# Patient Record
Sex: Male | Born: 1945 | Race: White | Hispanic: No | Marital: Married | Smoking: Never smoker
Health system: Southern US, Community
[De-identification: ages and names within clinical notes are randomized; demographics above are authoritative.]

## PROBLEM LIST (undated history)

## (undated) DIAGNOSIS — C61 Malignant neoplasm of prostate: Secondary | ICD-10-CM

## (undated) DIAGNOSIS — I82409 Acute embolism and thrombosis of unspecified deep veins of unspecified lower extremity: Secondary | ICD-10-CM

## (undated) DIAGNOSIS — Z86718 Personal history of other venous thrombosis and embolism: Secondary | ICD-10-CM

## (undated) DIAGNOSIS — E785 Hyperlipidemia, unspecified: Secondary | ICD-10-CM

## (undated) DIAGNOSIS — L570 Actinic keratosis: Secondary | ICD-10-CM

## (undated) DIAGNOSIS — E119 Type 2 diabetes mellitus without complications: Secondary | ICD-10-CM

## (undated) DIAGNOSIS — G4733 Obstructive sleep apnea (adult) (pediatric): Secondary | ICD-10-CM

## (undated) DIAGNOSIS — G459 Transient cerebral ischemic attack, unspecified: Secondary | ICD-10-CM

## (undated) DIAGNOSIS — E291 Testicular hypofunction: Secondary | ICD-10-CM

## (undated) DIAGNOSIS — K219 Gastro-esophageal reflux disease without esophagitis: Secondary | ICD-10-CM

## (undated) DIAGNOSIS — Z973 Presence of spectacles and contact lenses: Secondary | ICD-10-CM

## (undated) DIAGNOSIS — K429 Umbilical hernia without obstruction or gangrene: Secondary | ICD-10-CM

## (undated) DIAGNOSIS — M1990 Unspecified osteoarthritis, unspecified site: Secondary | ICD-10-CM

## (undated) DIAGNOSIS — Z974 Presence of external hearing-aid: Secondary | ICD-10-CM

## (undated) DIAGNOSIS — N529 Male erectile dysfunction, unspecified: Secondary | ICD-10-CM

## (undated) DIAGNOSIS — I10 Essential (primary) hypertension: Secondary | ICD-10-CM

## (undated) DIAGNOSIS — Z7901 Long term (current) use of anticoagulants: Secondary | ICD-10-CM

## (undated) DIAGNOSIS — Z8673 Personal history of transient ischemic attack (TIA), and cerebral infarction without residual deficits: Secondary | ICD-10-CM

## (undated) DIAGNOSIS — G629 Polyneuropathy, unspecified: Secondary | ICD-10-CM

## (undated) DIAGNOSIS — K449 Diaphragmatic hernia without obstruction or gangrene: Secondary | ICD-10-CM

## (undated) HISTORY — DX: Umbilical hernia without obstruction or gangrene: K42.9

## (undated) HISTORY — DX: Transient cerebral ischemic attack, unspecified: G45.9

## (undated) HISTORY — PX: OTHER SURGICAL HISTORY: SHX169

## (undated) HISTORY — DX: Actinic keratosis: L57.0

## (undated) HISTORY — PX: TOTAL KNEE ARTHROPLASTY: SHX125

---

## 1995-11-19 HISTORY — PX: ELBOW BURSA SURGERY: SHX615

## 2005-03-30 ENCOUNTER — Ambulatory Visit: Payer: Self-pay | Admitting: Unknown Physician Specialty

## 2005-08-04 ENCOUNTER — Ambulatory Visit: Payer: Self-pay | Admitting: Internal Medicine

## 2005-09-08 ENCOUNTER — Ambulatory Visit: Payer: Self-pay | Admitting: Unknown Physician Specialty

## 2006-04-11 ENCOUNTER — Ambulatory Visit: Payer: Self-pay | Admitting: Internal Medicine

## 2006-09-01 ENCOUNTER — Ambulatory Visit: Payer: Self-pay | Admitting: Internal Medicine

## 2007-02-17 ENCOUNTER — Ambulatory Visit: Payer: Self-pay | Admitting: Podiatry

## 2007-09-26 ENCOUNTER — Ambulatory Visit: Payer: Self-pay | Admitting: Internal Medicine

## 2007-11-30 ENCOUNTER — Ambulatory Visit: Payer: Self-pay | Admitting: Internal Medicine

## 2008-06-27 ENCOUNTER — Ambulatory Visit: Payer: Self-pay | Admitting: Internal Medicine

## 2008-07-29 ENCOUNTER — Ambulatory Visit: Payer: Self-pay | Admitting: General Practice

## 2008-08-13 ENCOUNTER — Inpatient Hospital Stay: Payer: Self-pay | Admitting: General Practice

## 2008-09-18 ENCOUNTER — Encounter: Payer: Self-pay | Admitting: General Practice

## 2008-10-18 ENCOUNTER — Encounter: Payer: Self-pay | Admitting: General Practice

## 2008-10-31 ENCOUNTER — Ambulatory Visit: Payer: Self-pay | Admitting: General Practice

## 2008-11-14 ENCOUNTER — Inpatient Hospital Stay: Payer: Self-pay | Admitting: General Practice

## 2008-11-18 ENCOUNTER — Encounter: Payer: Self-pay | Admitting: General Practice

## 2008-12-06 ENCOUNTER — Encounter: Payer: Self-pay | Admitting: General Practice

## 2008-12-16 ENCOUNTER — Encounter: Payer: Self-pay | Admitting: General Practice

## 2008-12-18 ENCOUNTER — Ambulatory Visit: Payer: Self-pay | Admitting: Internal Medicine

## 2009-01-16 ENCOUNTER — Encounter: Payer: Self-pay | Admitting: General Practice

## 2009-03-04 ENCOUNTER — Ambulatory Visit: Payer: Self-pay | Admitting: Unknown Physician Specialty

## 2009-06-18 ENCOUNTER — Ambulatory Visit: Payer: Self-pay | Admitting: Internal Medicine

## 2009-07-04 ENCOUNTER — Ambulatory Visit: Payer: Self-pay

## 2009-08-13 ENCOUNTER — Ambulatory Visit: Payer: Self-pay | Admitting: Internal Medicine

## 2009-08-25 ENCOUNTER — Ambulatory Visit: Payer: Self-pay | Admitting: Internal Medicine

## 2009-09-14 ENCOUNTER — Other Ambulatory Visit: Payer: Self-pay | Admitting: Internal Medicine

## 2009-09-15 ENCOUNTER — Ambulatory Visit: Payer: Self-pay | Admitting: Internal Medicine

## 2009-09-17 ENCOUNTER — Ambulatory Visit: Payer: Self-pay | Admitting: Internal Medicine

## 2009-10-07 DIAGNOSIS — D239 Other benign neoplasm of skin, unspecified: Secondary | ICD-10-CM

## 2009-10-07 HISTORY — DX: Other benign neoplasm of skin, unspecified: D23.9

## 2009-10-18 ENCOUNTER — Ambulatory Visit: Payer: Self-pay | Admitting: Internal Medicine

## 2010-01-27 DIAGNOSIS — C4491 Basal cell carcinoma of skin, unspecified: Secondary | ICD-10-CM

## 2010-01-27 HISTORY — DX: Basal cell carcinoma of skin, unspecified: C44.91

## 2010-07-08 ENCOUNTER — Ambulatory Visit: Payer: Self-pay | Admitting: Internal Medicine

## 2010-11-12 ENCOUNTER — Ambulatory Visit: Payer: Self-pay | Admitting: Unknown Physician Specialty

## 2012-04-11 ENCOUNTER — Emergency Department: Payer: Self-pay | Admitting: Emergency Medicine

## 2012-04-11 LAB — COMPREHENSIVE METABOLIC PANEL
Albumin: 3.6 g/dL (ref 3.4–5.0)
Alkaline Phosphatase: 70 U/L (ref 50–136)
BUN: 19 mg/dL — ABNORMAL HIGH (ref 7–18)
Co2: 25 mmol/L (ref 21–32)
Glucose: 233 mg/dL — ABNORMAL HIGH (ref 65–99)
Potassium: 3.1 mmol/L — ABNORMAL LOW (ref 3.5–5.1)
SGOT(AST): 23 U/L (ref 15–37)
SGPT (ALT): 34 U/L
Total Protein: 7.1 g/dL (ref 6.4–8.2)

## 2012-04-11 LAB — CBC
HCT: 46.8 % (ref 40.0–52.0)
HGB: 16.1 g/dL (ref 13.0–18.0)
MCH: 32.1 pg (ref 26.0–34.0)
MCHC: 34.4 g/dL (ref 32.0–36.0)
MCV: 93 fL (ref 80–100)
Platelet: 176 10*3/uL (ref 150–440)
RBC: 5.02 10*6/uL (ref 4.40–5.90)
RDW: 13.6 % (ref 11.5–14.5)
WBC: 5 10*3/uL (ref 3.8–10.6)

## 2012-06-15 ENCOUNTER — Ambulatory Visit: Payer: Self-pay | Admitting: Surgery

## 2012-06-15 LAB — BASIC METABOLIC PANEL
Chloride: 103 mmol/L (ref 98–107)
EGFR (Non-African Amer.): 53 — ABNORMAL LOW
Osmolality: 285 (ref 275–301)
Potassium: 3.5 mmol/L (ref 3.5–5.1)
Sodium: 139 mmol/L (ref 136–145)

## 2012-06-23 ENCOUNTER — Ambulatory Visit: Payer: Self-pay | Admitting: Surgery

## 2012-06-23 HISTORY — PX: UMBILICAL HERNIA REPAIR: SHX196

## 2012-10-05 ENCOUNTER — Ambulatory Visit: Payer: Self-pay | Admitting: Unknown Physician Specialty

## 2014-01-15 DIAGNOSIS — Z79899 Other long term (current) drug therapy: Secondary | ICD-10-CM | POA: Insufficient documentation

## 2014-08-15 ENCOUNTER — Ambulatory Visit: Payer: Self-pay | Admitting: Nurse Practitioner

## 2014-08-27 ENCOUNTER — Ambulatory Visit: Payer: Self-pay | Admitting: Anesthesiology

## 2014-08-27 LAB — BASIC METABOLIC PANEL
Anion Gap: 10 (ref 7–16)
BUN: 29 mg/dL — ABNORMAL HIGH (ref 7–18)
CALCIUM: 9.1 mg/dL (ref 8.5–10.1)
CHLORIDE: 102 mmol/L (ref 98–107)
CREATININE: 1.49 mg/dL — AB (ref 0.60–1.30)
Co2: 30 mmol/L (ref 21–32)
EGFR (Non-African Amer.): 50 — ABNORMAL LOW
GLUCOSE: 154 mg/dL — AB (ref 65–99)
Osmolality: 292 (ref 275–301)
Potassium: 3.6 mmol/L (ref 3.5–5.1)
SODIUM: 142 mmol/L (ref 136–145)

## 2014-09-04 ENCOUNTER — Ambulatory Visit: Payer: Self-pay | Admitting: General Practice

## 2014-09-04 HISTORY — PX: SHOULDER OPEN ROTATOR CUFF REPAIR: SHX2407

## 2015-01-05 DIAGNOSIS — Z9889 Other specified postprocedural states: Secondary | ICD-10-CM | POA: Insufficient documentation

## 2015-02-04 NOTE — Op Note (Signed)
PATIENT NAME:  Jeremiah Zhang, Jeremiah Zhang MR#:  [id number] DATE OF BIRTH:  Nov 02, 1945  DATE OF PROCEDURE:  06/23/2012  PREOPERATIVE DIAGNOSIS: Umbilical hernia.   POSTOPERATIVE DIAGNOSIS: Umbilical hernia.   PROCEDURE: Umbilical hernia repair.   SURGEON: Rochel Brome, M.D.   ANESTHESIA: General.   INDICATIONS: This 69 year old male reports a bulge in the navel. It has been enlarging and causing some mild discomfort. An umbilical hernia was demonstrated on physical exam and repair was recommended for definitive treatment.   DESCRIPTION OF PROCEDURE: The patient was placed on the operating table in the supine position under general anesthesia. The abdomen was clipped and prepared with ChloraPrep and draped in a sterile manner. A transversely oriented curvilinear supraumbilical incision was made, carried down through subcutaneous tissues to encounter an umbilical hernia sac. This sac was dissected free from surrounding structures down through the fascial ring defect and it appeared to contain omentum. The sac was inverted and the properitoneal fat was separated from the fascial ring defect. A portion of attenuated fascia was excised. It did not need to be sent for pathology. Next, an Atrium mesh was selected and cut to create an oval shape of 1.5 x 2.5 cm in dimension. It was placed in the properitoneal plane, oriented transversely and sutured to the overlying fascia with through and through 0 Surgilon sutures. Next, the defect was closed with a transversely oriented suture line of interrupted 0 Surgilon figure-of-eight sutures incorporating mesh into each suture. The repair looked good. It is noted that during the course of the procedure a number of small bleeding points were cauterized. Hemostasis was subsequently intact. The subcutaneous tissues and deep fascia was infiltrated with 0.5% Sensorcaine with epinephrine. The skin of the umbilicus was sutured to the deep fascia with interrupted 5-0 Monocryl. The skin  was closed      with a running 5-0 Monocryl subcuticular suture and Dermabond. The patient tolerated surgery satisfactorily and was prepared for transfer to the recovery room.  ____________________________ Lenna Sciara. Rochel Brome, MD jws:ap D: 06/23/2012 08:40:14 ET T: 06/23/2012 13:14:25 ET JOB#: [id number]  cc: Loreli Dollar, MD, <Dictator> Loreli Dollar MD ELECTRONICALLY SIGNED 06/28/2012 15:51

## 2015-02-08 NOTE — Op Note (Signed)
PATIENT NAME:  Jeremiah Zhang, Jeremiah Zhang MR#:  [id number] DATE OF BIRTH:  August 24, 1946  DATE OF PROCEDURE:  09/04/2014  PREOPERATIVE DIAGNOSIS: Left rotator cuff tear.   POSTOPERATIVE DIAGNOSIS: Left rotator cuff tear.   PROCEDURE PERFORMED: Left subacromial decompression and rotator cuff tear (supraspinatus).   SURGEON: Skip Estimable, MD   ANESTHESIA: Interscalene block and general.   ESTIMATED BLOOD LOSS: 25 mL.   FLUIDS REPLACED: 1300 mL of crystalloid.   DRAINS: None.   IMPLANTS UTILIZED: ArthroCare Spartan 5.5 mm PEEK suture anchors x 2.   INDICATIONS FOR SURGERY: The patient is a 69 year old male who has been seen for complaints of left shoulder pain and difficulty with active range of motion. MRI demonstrated findings consistent with retracted tear of the rotator cuff. After discussion of the risks and benefits of surgical intervention, the patient expressed understanding of the risks and benefits and agreed with plans for surgical intervention.   PROCEDURE IN DETAIL: The patient was brought to the operating room and, after adequate interscalene block and general endotracheal anesthesia were achieved, the patient was placed in the modified beach chair position. The head was secured in headrest and all bony prominences were well padded. The patient's left shoulder and arm were cleaned and prepped with alcohol and DuraPrep and draped in the usual sterile fashion. A "timeout" was performed as per usual protocol. The anticipated incision site was injected with 0.25% Marcaine with epinephrine. An anterior oblique incision was made roughly bisecting the anterior aspect of the acromion. Deltoid was split in line and a "deltoid on" approach was utilized by elevating the deltoid in a subperiosteal fashion off the acromion. The subdeltoid bursa was noted to be extremely thickened and was subsequently excised. A Darrach retractor was inserted so as to protect the rotator cuff and an anterior-inferior wafer of  bone was removed from the acromion measuring approximately 3 mm in thickness. The TPS high-speed rasp was subsequently used to further debride and decompress the subacromial region. The wound was irrigated with copious amounts of normal saline with antibiotic solution. The supraspinatus was completely torn and retracted. A Joker elevator was used to mobilize the leading edge of the supraspinatus tendon. A trough was made in the bone just medial to the greater tuberosity. Two Spartan 5.5 mm PEEK suture anchors were placed and the supraspinatus tendon was repaired in place using the associated FiberWire sutures. Good maintenance of the repair was noted with placement of  shoulder through range of motion. The wound was irrigated with copious amounts of normal saline with antibiotic solution. Deltoid was repaired in a side-to-side fashion using interrupted sutures of #1 Ethibond. The subcutaneous tissue was approximated in layers using first #0 Vicryl followed by 2-0 Vicryl. Skin was closed with a running subcuticular suture of 4-0 Vicryl. Steri-Strips were applied followed by application of a sterile dressing. The patient tolerated the procedure well. He was transported to the recovery room in stable condition.   ____________________________ Laurice Record. Holley Bouche., MD jph:bm D: 09/05/2014 00:30:28 ET T: 09/05/2014 03:18:15 ET JOB#: [id number]  cc: Laurice Record. Holley Bouche., MD, <Dictator> Laurice Record Holley Bouche MD ELECTRONICALLY SIGNED 09/05/2014 21:42

## 2015-05-05 ENCOUNTER — Other Ambulatory Visit: Payer: Self-pay | Admitting: Urology

## 2015-05-15 ENCOUNTER — Other Ambulatory Visit: Payer: Self-pay | Admitting: Urology

## 2015-05-26 ENCOUNTER — Encounter (HOSPITAL_BASED_OUTPATIENT_CLINIC_OR_DEPARTMENT_OTHER): Payer: Self-pay | Admitting: *Deleted

## 2015-05-26 NOTE — Progress Notes (Addendum)
NPO AFTER MN. ARRIVE AT 0715.  NEEDS ISTAT.  CURRENT EKG IN CHART AND EPIC.   WILL TAKE NEXIUM AM DOS W/ SIPS OF WATER.

## 2015-06-01 NOTE — H&P (Signed)
Reason For Visit Consult to discuss focal cryotherapy   Active Problems Problems  1. Decreased libido (R68.82) 2. Elevated prostate specific antigen (PSA) (R97.2) 3. Erectile dysfunction (N52.9) 4. Prostate cancer (C61)  History of Present Illness 8 male referred by Dr. Louis Meckel for consult to discuss focal cryotherapy for hx of prostate cancer. Originally referred by Dr. Dimas Aguas, MD for eval and management of an elevated PSA.    Patient was found to have an elevated PSA which was drawn as part of a prostate cancer screening. He has no family history of prostate cancer. The patient denies any bone pain, new back pain, or lower extremity edema. The patient denies any changes in his voiding symptoms over the last 6 months. Specifically he denies dysuria or hematuria.    The patient was taking Testosterone replacement therapy (3 pumps Androgel daily). This also was in therapy has been stopped at this time.    PSA History:  2.0 on 08/02/11  4.41 on 01/2014   6.4 (13.6% free) on 07/2014   3.86 on 08/21/14  6.98 (36%free) on 11/19/14  4.14 on 11/25/14  3.25 on 01/24/15    Testosterone:  408 on 01/2014  871 on 07/2014  123 on 11/19/14  121 on 02/11/15    IPSS: 9, QoL 5  SHIM: He has very diminished erectile function. This is despite taking Cialis 10 mg daily. Occasionally he is able to obtain an erection which is not durable  On Plavix for concern of intermittent TIAs    Prostate cancer profile  Stage: T1c  PSA: 4.14  Biopsy , 1/12 cores positive: 4+3= 7(<5%) LMB, ASAP RMA, HGPIN RLM  Prostate volume: 76gm    Prostate cancer nomogram after radical prostatectomy:  OC- 57%  ECE- 46%  SVI- 4%  LNI -5%  PFS (surgery)- 79%(50yrs)/67% (40yrs)    Interval: The patient presents today to discuss his pathology report from his recent prostate biopsy. The patient states that he had some blood per rectum following his biopsy, this resolved within a few  days. He did not have any hematuria. He denies any fevers or chills, no evidence of infections.   Past Medical History Problems  1. 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 History of arthritis (Z87.39) 3. History of diabetes mellitus (Z86.39) 4. History of heartburn (Z87.898) 5. History of hypercholesterolemia (Z86.39) 6. History of hypertension (Z86.79) 7. History of sleep apnea (Z87.09)  Surgical History Problems  1. History of Arthroscopy Elbow Left 2. History of Knee Surgery Left 3. History of Knee Surgery Right 4. History of Rotator Cuff Repair  Current Meds 1. Bisoprolol-Hydrochlorothiazide 10-6.25 MG Oral Tablet;  Therapy: (Recorded:04Nov2015) to Recorded 2. Cialis 10 MG Oral Tablet; TAKE 1 TABLET DAILY 1 HOUR BEFORE NEEDED;  Therapy: [id number] to (Evaluate:21Feb2016); Last Rx:17Nov2015 Ordered 3. Cialis 5 MG Oral Tablet; Take 1 tablet daily;  Therapy: [id number] to (Evaluate:18May2016)  Requested for: [phone]; Last  Rx:20Nov2015 Ordered 4. Daily Multivitamin TABS;  Therapy: (Recorded:04Nov2015) to Recorded 5. EpiPen 2-Pak 0.3 MG/0.3ML DEVI;  Therapy: (Recorded:04Nov2015) to Recorded 6. Furosemide 40 MG Oral Tablet;  Therapy: (Recorded:04Nov2015) to Recorded 7. Glimepiride 2 MG Oral Tablet; 2/daily;  Therapy: (Recorded:04Nov2015) to Recorded 8. Invokana 300 MG Oral Tablet;  Therapy: (Recorded:04Nov2015) to Recorded 9. Jublia SOLN;  Therapy: (Recorded:08Feb2016) to Recorded 10. MetFORMIN HCl - 1000 MG Oral Tablet;   Therapy: (Recorded:04Nov2015) to Recorded 11. NexIUM 40 MG Oral Capsule Delayed Release;   Therapy: (Recorded:04Nov2015) to Recorded 12. Plavix 75 MG Oral Tablet;  Therapy: (Recorded:08Feb2016) to Recorded 13. Quinapril HCl TABS; 20mg  x 2 daily;   Therapy: (Recorded:04Nov2015) to Recorded 14. Vitamin B Complex TABS; B1-3mg , B2-3mg ;   Therapy: (Recorded:04Nov2015) to Recorded 15. Vitamin C TABS;   Therapy: (Recorded:04Nov2015)  to Recorded 16. Vitamin D3 CAPS; 2000 iu;   Therapy: (Recorded:04Nov2015) to Recorded  Allergies Medication  1. Crestor TABS 2. Dynapen 3. Vytorin TABS Non-Medication  4. Bee sting  Family History Problems  1. No pertinent family history : Mother, Father  Social History Problems    Denied: History of Alcohol use   Caffeine use (F15.90)   Father deceased   Married   Mother deceased   Never a smoker   Number of children   Occupation   Retired  Review of Systems No changes in pts bowel habits, neurological changes, or progressive lower urinary tract symptoms.      Vitals Vital Signs [Data Includes: Last 1 Day]  Recorded: [id number] 04:46PM  Blood Pressure: 118 / 80 Temperature: 98.3 F Heart Rate: 65  Physical Exam Constitutional: Well nourished and well developed . No acute distress.  ENT:. The ears and nose are normal in appearance.  Neck: The appearance of the neck is normal and no neck mass is present.  Pulmonary: No respiratory distress and normal respiratory rhythm and effort.  Cardiovascular: Heart rate and rhythm are normal . No peripheral edema.  Abdomen: The abdomen is soft and nontender. No masses are palpated. No CVA tenderness. No hernias are palpable. No hepatosplenomegaly noted.  Rectal: Rectal exam demonstrates normal sphincter tone, no tenderness and no masses. The prostate has no nodularity and is not tender. The left seminal vesicle is nonpalpable. The right seminal vesicle is nonpalpable. The perineum is normal on inspection.  Genitourinary: Examination of the penis demonstrates no discharge, no masses, no lesions and a normal meatus. The scrotum is without lesions. The right epididymis is palpably normal and non-tender. The left epididymis is palpably normal and non-tender. The right testis is non-tender and without masses. The left testis is non-tender and without masses.  Lymphatics: The femoral and inguinal nodes are not enlarged or tender.   Skin: Normal skin turgor, no visible rash and no visible skin lesions.  Neuro/Psych:. Mood and affect are appropriate.    Results/Data Selected Results  TESTOSTERONE 25Apr2016 03:21PM Louis Meckel  SPECIMEN TYPE: BLOOD   Test Name Result Flag Reference  TESTOSTERONE, TOTAL 121 ng/dL L 300-890  TANNER STAGE       MALE              MALE               I              < 30 NG/DL        < 10 NG/DL               II             < 150 NG/DL       < 30 NG/DL               III            100-320 NG/DL     < 35 NG/DL               IV             200-970 NG/DL     15-40 NG/DL  V/ADULT        300-890 NG/DL     10-70 NG/DL   PSA [id number] 09:11AM Louis Meckel  SPECIMEN TYPE: BLOOD   Test Name Result Flag Reference  PSA 4.14 ng/mL H <=4.00  TEST METHODOLOGY: ECLIA PSA (ELECTROCHEMILUMINESCENCE IMMUNOASSAY)   Assessment Assessed  1. Prostate cancer (C61) 2. Erectile dysfunction (N52.9) 3. Elevated prostate specific antigen (PSA) (R97.2) 4. Hypogonadism, testicular (E29.1) 5. Decreased libido (R68.82)  ( 60 monute discussion re: focal Rx of CaP) Dr. 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 We have discussed the history of Gleason grading, and the meaning of Gleason scoring and Gleason summation. He understands that Gleason sums of 2, 3, 4, 5, and 6 are often untreated, and would be observed-if even reported. However, his cancer is a G 7 disease, and we have discussed the difference on G 3+4 and G 4+ 3 prostate cancer. He has been offered observation and slso LRRP by Dr Louis Meckel. However, he desires to evaluate alternative treatments for his disease. I have indicated that I agree with Dr. Louis Meckel that, even though he has low volume of cancer, the fact that he has Grade 4 cancer would push me to explore treatment alternatives with him. He is in agreement with this opinion.    We have discussed the history of I-125 seed therapy development, in [place],  in [place], and in [place]; and the option for this treatment for him. In addition, we have discussed the development of focal therapy and also the history of cryotherapy for prostate cancer. We have discussed the biology of cryotherapy, and how it will work in his particular case. We have also considered the "newness" of focal therapy, and options for this Rx, including focal cryo, hemicryo, HoLap enucleation ( Humphries, Mayo, [place]); and potential for future hormone replacement therapy for his clinically significant hypogonadism.    He will need to stop Plavix for surgery. He will need to have f/u biopsies in 1 year, and could be considered for restart of hormone therapy when his PSA nadirs. We have considered potential for incontinence, anesthesia complications, and compared this surgery with other surgeries ( I-125 seed Rx, IMRT, LRRP).    We have answered all questions to the pt's satisfaction and to the satisfaction of his wife ( retired Tree surgeon)   Plan Focal cryotherapy of the prostate,   Discussion/Summary cc: Dr. Burman Nieves  cc: Dr. Maxie Barb     Signatures Electronically signed by : Carolan Clines, M.D.; Apr 30 2015  6:25PM EST

## 2015-06-02 ENCOUNTER — Ambulatory Visit (HOSPITAL_BASED_OUTPATIENT_CLINIC_OR_DEPARTMENT_OTHER)
Admission: RE | Admit: 2015-06-02 | Discharge: 2015-06-02 | Disposition: A | Discharge status: Home / Self Care | Payer: Medicare Other | Source: Ambulatory Visit | Attending: Urology | Admitting: Urology

## 2015-06-02 ENCOUNTER — Encounter (HOSPITAL_BASED_OUTPATIENT_CLINIC_OR_DEPARTMENT_OTHER)
Admission: RE | Disposition: A | Discharge status: Home / Self Care | Payer: Self-pay | Source: Ambulatory Visit | Attending: Urology

## 2015-06-02 ENCOUNTER — Ambulatory Visit (HOSPITAL_BASED_OUTPATIENT_CLINIC_OR_DEPARTMENT_OTHER): Payer: Medicare Other | Admitting: Anesthesiology

## 2015-06-02 ENCOUNTER — Encounter (HOSPITAL_BASED_OUTPATIENT_CLINIC_OR_DEPARTMENT_OTHER): Payer: Self-pay | Admitting: *Deleted

## 2015-06-02 DIAGNOSIS — N529 Male erectile dysfunction, unspecified: Secondary | ICD-10-CM | POA: Insufficient documentation

## 2015-06-02 DIAGNOSIS — K449 Diaphragmatic hernia without obstruction or gangrene: Secondary | ICD-10-CM | POA: Diagnosis not present

## 2015-06-02 DIAGNOSIS — G4730 Sleep apnea, unspecified: Secondary | ICD-10-CM | POA: Insufficient documentation

## 2015-06-02 DIAGNOSIS — C61 Malignant neoplasm of prostate: Secondary | ICD-10-CM | POA: Insufficient documentation

## 2015-06-02 DIAGNOSIS — Z79899 Other long term (current) drug therapy: Secondary | ICD-10-CM | POA: Insufficient documentation

## 2015-06-02 DIAGNOSIS — K219 Gastro-esophageal reflux disease without esophagitis: Secondary | ICD-10-CM | POA: Insufficient documentation

## 2015-06-02 DIAGNOSIS — E291 Testicular hypofunction: Secondary | ICD-10-CM | POA: Diagnosis not present

## 2015-06-02 DIAGNOSIS — E119 Type 2 diabetes mellitus without complications: Secondary | ICD-10-CM | POA: Diagnosis not present

## 2015-06-02 DIAGNOSIS — M1990 Unspecified osteoarthritis, unspecified site: Secondary | ICD-10-CM | POA: Insufficient documentation

## 2015-06-02 DIAGNOSIS — Z7902 Long term (current) use of antithrombotics/antiplatelets: Secondary | ICD-10-CM | POA: Insufficient documentation

## 2015-06-02 DIAGNOSIS — I10 Essential (primary) hypertension: Secondary | ICD-10-CM | POA: Diagnosis not present

## 2015-06-02 HISTORY — DX: Essential (primary) hypertension: I10

## 2015-06-02 HISTORY — DX: Hyperlipidemia, unspecified: E78.5

## 2015-06-02 HISTORY — DX: Malignant neoplasm of prostate: C61

## 2015-06-02 HISTORY — DX: Type 2 diabetes mellitus without complications: E11.9

## 2015-06-02 HISTORY — DX: Presence of external hearing-aid: Z97.4

## 2015-06-02 HISTORY — DX: Obstructive sleep apnea (adult) (pediatric): G47.33

## 2015-06-02 HISTORY — DX: Gastro-esophageal reflux disease without esophagitis: K21.9

## 2015-06-02 HISTORY — DX: Presence of spectacles and contact lenses: Z97.3

## 2015-06-02 HISTORY — DX: Unspecified osteoarthritis, unspecified site: M19.90

## 2015-06-02 HISTORY — PX: CRYOABLATION: SHX1415

## 2015-06-02 LAB — POCT I-STAT, CHEM 8
BUN: 21 mg/dL — ABNORMAL HIGH (ref 6–20)
CALCIUM ION: 1.17 mmol/L (ref 1.13–1.30)
CHLORIDE: 100 mmol/L — AB (ref 101–111)
Creatinine, Ser: 1.2 mg/dL (ref 0.61–1.24)
Glucose, Bld: 143 mg/dL — ABNORMAL HIGH (ref 65–99)
HCT: 44 % (ref 39.0–52.0)
Hemoglobin: 15 g/dL (ref 13.0–17.0)
POTASSIUM: 3.7 mmol/L (ref 3.5–5.1)
Sodium: 141 mmol/L (ref 135–145)
TCO2: 26 mmol/L (ref 0–100)

## 2015-06-02 LAB — GLUCOSE, CAPILLARY: GLUCOSE-CAPILLARY: 146 mg/dL — AB (ref 65–99)

## 2015-06-02 SURGERY — CRYOABLATION, PROSTATE
Anesthesia: General | Site: Prostate

## 2015-06-02 MED ORDER — MIDAZOLAM HCL 5 MG/5ML IJ SOLN
INTRAMUSCULAR | Status: DC | PRN
  Administered 2015-06-02: 2 mg via INTRAVENOUS

## 2015-06-02 MED ORDER — ONDANSETRON HCL 4 MG/2ML IJ SOLN
INTRAMUSCULAR | Status: DC | PRN
  Administered 2015-06-02: 4 mg via INTRAVENOUS

## 2015-06-02 MED ORDER — MIDAZOLAM HCL 5 MG/5ML IJ SOLN: INTRAMUSCULAR | Status: DC | PRN

## 2015-06-02 MED ORDER — PHENAZOPYRIDINE HCL 200 MG PO TABS: 200.0000 mg | ORAL_TABLET | Freq: Three times a day (TID) | ORAL | Status: DC | PRN

## 2015-06-02 MED ORDER — PROPOFOL 10 MG/ML IV BOLUS
INTRAVENOUS | Status: DC | PRN
  Administered 2015-06-02: 200 mg via INTRAVENOUS

## 2015-06-02 MED ORDER — FENTANYL CITRATE (PF) 100 MCG/2ML IJ SOLN
INTRAMUSCULAR | Status: AC
  Filled 2015-06-02: qty 4

## 2015-06-02 MED ORDER — TAMSULOSIN HCL 0.4 MG PO CAPS: 0.4000 mg | ORAL_CAPSULE | Freq: Every day | ORAL | Status: DC

## 2015-06-02 MED ORDER — ACETAMINOPHEN 10 MG/ML IV SOLN
INTRAVENOUS | Status: DC | PRN
  Administered 2015-06-02: 1000 mg via INTRAVENOUS

## 2015-06-02 MED ORDER — GENTAMICIN SULFATE 40 MG/ML IJ SOLN
440.0000 mg | Freq: Once | INTRAMUSCULAR | Status: AC
  Administered 2015-06-02: 440 mg via INTRAVENOUS
  Filled 2015-06-02: qty 11

## 2015-06-02 MED ORDER — MIDAZOLAM HCL 2 MG/2ML IJ SOLN
INTRAMUSCULAR | Status: AC
  Filled 2015-06-02: qty 2

## 2015-06-02 MED ORDER — LACTATED RINGERS IV SOLN
INTRAVENOUS | Status: DC
  Administered 2015-06-02 (×3): via INTRAVENOUS
  Filled 2015-06-02: qty 1000

## 2015-06-02 MED ORDER — OXYCODONE-ACETAMINOPHEN 5-325 MG PO TABS: 1.0000 | ORAL_TABLET | ORAL | Status: DC | PRN

## 2015-06-02 MED ORDER — DEXAMETHASONE SODIUM PHOSPHATE 4 MG/ML IJ SOLN
INTRAMUSCULAR | Status: DC | PRN
  Administered 2015-06-02: 10 mg via INTRAVENOUS

## 2015-06-02 MED ORDER — LIDOCAINE HCL (CARDIAC) 20 MG/ML IV SOLN
INTRAVENOUS | Status: DC | PRN
  Administered 2015-06-02: 80 mg via INTRAVENOUS

## 2015-06-02 MED ORDER — SODIUM CHLORIDE 0.9 % IV SOLN
2.0000 g | Freq: Once | INTRAVENOUS | Status: AC
  Administered 2015-06-02: 2 g via INTRAVENOUS
  Filled 2015-06-02: qty 2000

## 2015-06-02 MED ORDER — FENTANYL CITRATE (PF) 100 MCG/2ML IJ SOLN
INTRAMUSCULAR | Status: DC | PRN
  Administered 2015-06-02: 50 ug via INTRAVENOUS
  Administered 2015-06-02 (×2): 25 ug via INTRAVENOUS

## 2015-06-02 MED ORDER — DOXYCYCLINE HYCLATE 50 MG PO CAPS: 100.0000 mg | ORAL_CAPSULE | Freq: Two times a day (BID) | ORAL | Status: DC

## 2015-06-02 MED ORDER — FENTANYL CITRATE (PF) 100 MCG/2ML IJ SOLN
25.0000 ug | INTRAMUSCULAR | Status: DC | PRN
  Administered 2015-06-02: 25 ug via INTRAVENOUS
  Filled 2015-06-02: qty 1

## 2015-06-02 MED ORDER — BUPIVACAINE HCL 0.25 % IJ SOLN
INTRAMUSCULAR | Status: DC | PRN
  Administered 2015-06-02: 10 mL

## 2015-06-02 MED ORDER — FENTANYL CITRATE (PF) 100 MCG/2ML IJ SOLN
INTRAMUSCULAR | Status: AC
  Filled 2015-06-02: qty 2

## 2015-06-02 MED ORDER — LACTATED RINGERS IV SOLN
INTRAVENOUS | Status: DC
  Filled 2015-06-02: qty 1000

## 2015-06-02 MED ORDER — KETOROLAC TROMETHAMINE 30 MG/ML IJ SOLN
INTRAMUSCULAR | Status: DC | PRN
  Administered 2015-06-02: 30 mg via INTRAVENOUS

## 2015-06-02 MED ORDER — DEXTROSE 5 % IV SOLN: 1.5000 mg/kg | Freq: Once | INTRAVENOUS | Status: DC

## 2015-06-02 MED ORDER — SODIUM CHLORIDE 0.9 % IR SOLN
Status: DC | PRN
  Administered 2015-06-02: 3000 mL

## 2015-06-02 MED ORDER — AMPICILLIN SODIUM 1 G IJ SOLR
INTRAMUSCULAR | Status: AC
  Filled 2015-06-02: qty 2000

## 2015-06-02 MED ORDER — EPHEDRINE SULFATE 50 MG/ML IJ SOLN
INTRAMUSCULAR | Status: DC | PRN
  Administered 2015-06-02: 5 mg via INTRAVENOUS
  Administered 2015-06-02: 10 mg via INTRAVENOUS
  Administered 2015-06-02 (×2): 5 mg via INTRAVENOUS
  Administered 2015-06-02: 10 mg via INTRAVENOUS

## 2015-06-02 SURGICAL SUPPLY — 40 items
BAG DRN ANRFLXCHMBR STRAP LEK (BAG)
BAG URINE DRAINAGE (UROLOGICAL SUPPLIES) ×2 IMPLANT
BAG URINE LEG 19OZ MD ST LTX (BAG) IMPLANT
BLADE CLIPPER SURG (BLADE) ×3 IMPLANT
BNDG CONFORM 3 STRL LF (GAUZE/BANDAGES/DRESSINGS) IMPLANT
BOOTIES KNEE HIGH SLOAN (MISCELLANEOUS) ×3 IMPLANT
CATH COUDE FOLEY 2W 5CC 18FR (CATHETERS) ×2 IMPLANT
CATH FOLEY 2WAY SLVR  5CC 18FR (CATHETERS)
CATH FOLEY 2WAY SLVR 5CC 18FR (CATHETERS) ×1 IMPLANT
CHARGE TECH PROCEDURE ONCURA (LABOR (TRAVEL & OVERTIME)) ×3 IMPLANT
CLOTH BEACON ORANGE TIMEOUT ST (SAFETY) ×3 IMPLANT
COVER BACK TABLE 60X90IN (DRAPES) ×3 IMPLANT
COVER MAYO STAND STRL (DRAPES) ×1 IMPLANT
DRAPE INCISE IOBAN 66X45 STRL (DRAPES) ×3 IMPLANT
DRSG TEGADERM 4X4.75 (GAUZE/BANDAGES/DRESSINGS) ×3 IMPLANT
DRSG TEGADERM 8X12 (GAUZE/BANDAGES/DRESSINGS) ×3 IMPLANT
GAS ARGON HIGH PRESSURE (MEDICAL GASES) ×3 IMPLANT
GAS HELIUM HIGH PRESSURE (MEDICAL GASES) ×3 IMPLANT
GAUZE SPONGE 4X4 12PLY STRL (GAUZE/BANDAGES/DRESSINGS) IMPLANT
GLOVE BIO SURGEON STRL SZ 6.5 (GLOVE) ×1 IMPLANT
GLOVE BIO SURGEON STRL SZ7.5 (GLOVE) ×5 IMPLANT
GLOVE BIO SURGEONS STRL SZ 6.5 (GLOVE) ×1
GLOVE INDICATOR 6.5 STRL GRN (GLOVE) ×2 IMPLANT
GUIDEWIRE SUPER STIFF (WIRE) ×3 IMPLANT
HOLDER FOLEY CATH W/STRAP (MISCELLANEOUS) ×3 IMPLANT
KIT CRYO ENDOCARE (DISPOSABLE) ×2 IMPLANT
KIT ICE ROD PROCEDURE PRECISE (DISPOSABLE) IMPLANT
KIT PROSTATE PRESICE I (KITS) IMPLANT
MANIFOLD NEPTUNE II (INSTRUMENTS) IMPLANT
NDL SPNL 18GX3.5 QUINCKE PK (NEEDLE) IMPLANT
NEEDLE SPNL 18GX3.5 QUINCKE PK (NEEDLE) IMPLANT
PACK CYSTOSCOPY (CUSTOM PROCEDURE TRAY) ×3 IMPLANT
PLUG CATH AND CAP STER (CATHETERS) ×3 IMPLANT
SHEET LAVH (DRAPES) ×1 IMPLANT
SPONGE GAUZE 4X4 12PLY STER LF (GAUZE/BANDAGES/DRESSINGS) ×2 IMPLANT
SUT SILK 3 0 PS 1 (SUTURE) ×2 IMPLANT
SYRINGE 10CC LL (SYRINGE) ×2 IMPLANT
SYRINGE IRR TOOMEY STRL 70CC (SYRINGE) IMPLANT
UNDERPAD 30X30 INCONTINENT (UNDERPADS AND DIAPERS) ×3 IMPLANT
WATER STERILE IRR 500ML POUR (IV SOLUTION) ×3 IMPLANT

## 2015-06-02 NOTE — Interval H&P Note (Signed)
History and Physical Interval Note:  06/02/2015 8:47 AM  Jeanine Luz Maris  has presented today for surgery, with the diagnosis of PROSTATE CANCER   The various methods of treatment have been discussed with the patient and family. After consideration of risks, benefits and other options for treatment, the patient has consented to  Procedure(s): CRYO ABLATION PROSTATE (N/A) as a surgical intervention .  The patient's history has been reviewed, patient examined, no change in status, stable for surgery.  I have reviewed the patient's chart and labs.  Questions were answered to the patient's satisfaction.   Urology Attending Note: Dx and Rsx again discussed with pt. He as bilateral knee orthopedic appliances; therefore his antibiotic changed to gentamycin and amoxicillin.  We have discussed follow-up. He will have removal of his foley by his wife, a Therapist, sports ( [place]) in 2 days), and be given Flomax post-op. In addition, we will re-ck his T levels post op, x 2, and consider Rx when his psa nadirs again. Will consider clomiphene Rx to restart his testes making testosterone, and follow his T , and his psa.   Khalee Mazo I Henery Betzold

## 2015-06-02 NOTE — Op Note (Signed)
Pre-operative diagnosis :  T1c Gleason 7 CaP, Left prostate base  Postoperative diagnosis:  Same  Operation: Focal cryotherapy, left prostate base  Surgeon:  S. [name], MD  First assistant:  None  Anesthesia:  Gen. LMA  Preparation:       After appropriate preanesthesia, the patient was brought to the operative room, placed on the operative table in the dorsal supine position where general LMA anesthesia was introduced. The arm and was double checked. The history was double checked.  Review history:  39 male referred by Dr. Louis Meckel for consult to discuss focal cryotherapy for hx of prostate cancer. Originally referred by Dr. Dimas Aguas, MD for eval and management of an elevated PSA.    Patient was found to have an elevated PSA which was drawn as part of a prostate cancer screening. He has no family history of prostate cancer. The patient denies any bone pain, new back pain, or lower extremity edema. The patient denies any changes in his voiding symptoms over the last 6 months. Specifically he denies dysuria or hematuria.    The patient was taking Testosterone replacement therapy (3 pumps Androgel daily). This also was in therapy has been stopped at this time.    PSA History:  2.0 on 08/02/11  4.41 on 01/2014   6.4 (13.6% free) on 07/2014   3.86 on 08/21/14  6.98 (36%free) on 11/19/14  4.14 on 11/25/14  3.25 on 01/24/15    Testosterone:  408 on 01/2014  871 on 07/2014  123 on 11/19/14  121 on 02/11/15    IPSS: 9, QoL 5  SHIM: He has very diminished erectile function. This is despite taking Cialis 10 mg daily. Occasionally he is able to obtain an erection which is not durable  On Plavix for concern of intermittent TIA's    Prostate cancer profile  Stage: T1c  PSA: 4.14  Biopsy , 1/12 cores positive: 4+3= 7(<5%) LMB, ASAP RMA, HG PIN RLM       Statement of  Likelihood of Success: Excellent. TIME-OUT observed.:  Procedure:  The Foley catheter was  difficult to place in the beginning procedure. Therefore, urethral dilators were placed, and the patient was found to have an elevated bladder neck. I replaced the Foley catheter with a coud-tip catheter, 32 French, and this catheter easily traversed the bladder neck.   With the Foley catheter placed, the transrectal ultrasound probe was placed. Repeat ultrasonography revealed the prostate to be 39.3 mL by volume. The prostate was asymmetric. Attention was directed to the left base of the prostate, where the patient's prostate cancer was noted on biopsy. 2 cryoprobes were placed at the left base, medial and lateral. 3 sensors were then placed, in the external sphincter, and Denonvilliers' fascia, and in the rectal sphincter.    The Foley catheter was then removed, and flexible cystoscopy was placed, with no evidence of probes within the urethra, or within the bladder. The stiff guidewire was placed through the flexible cystoscope and coiled in the bladder, in order to place the urethral warming device. The flexible cystoscope was then removed, and the urethral warming device was placed in standard fashion under ultrasound guidance, with removal of the guidewire.  The patient then underwent 2 freeze and thaw cycles to -40 with argon gas to freeze, and helium gas to thaw. In the second cycle, helium was used to start the thaw, and then the patient had continued natural thaw. After 20 minutes of continued urethral warming device, the urethral warming device  was removed, and the coud Foley catheter was replaced, to straight drainage.  The patient received IV Toradol and IV Tylenol at the end of procedure. He was awakened and taken to recovery room in good condition.

## 2015-06-02 NOTE — Transfer of Care (Signed)
Immediate Anesthesia Transfer of Care Note  Patient: Jeremiah Zhang  Procedure(s) Performed: Procedure(s): CRYO ABLATION PROSTATE (N/A)  Patient Location: PACU  Anesthesia Type:General  Level of Consciousness: awake, alert , oriented and patient cooperative  Airway & Oxygen Therapy: Patient Spontanous Breathing and Patient connected to nasal cannula oxygen  Post-op Assessment: Report given to RN and Post -op Vital signs reviewed and stable  Post vital signs: Reviewed and stable  Last Vitals:  Filed Vitals:   06/02/15 0726  BP: 132/74  Pulse: 67  Temp: 37.1 C  Resp: 18    Complications: No apparent anesthesia complications

## 2015-06-02 NOTE — Discharge Instructions (Addendum)
INSTRUCTIONS AFTER CRYOABLATION OF THE PROSTATE  Normal Findings After Cryoablation:   You may experience a number of symptoms after the cryoablation which occur in  some patients.  There is no cause for alarm should this happen.  These    symptoms include:  -Blood in the urine.  When you are discharged after your surgery, your urine   will have some blood in it and may appear red.  This occurs to some degree in all patients after cryoablation and is not cause for alarm.  Your urine should clear approximately 24 hours after the procedure, but may persist for quite a while longer.  -Scrotal and penile swelling and bruising.  This occurs about 2-3 days after   the cryoablation and is caused by tissue swelling which temporarily blocks the drainage of lymph.  This is painless and resolves in less than one week.  Ice packs and lying down for short periods of time during the day will improve the swelling.  -Small amounts of bloody discharge from the end of your penis.  This can  occur for up to 6 weeks after the procedure and is not cause for alarm.  It is due  to some discharge from the urethra in the area of the prostate.  -Some numbness in the head of the penis.  Occasionally, when a large  amount of freezing has been performed during the procedure, the nerve which  supplies sensation to one or both sides of the head of the penis may be affected. The sensation returns after a number of months.  Call your doctor at [phone] if any of the following occur:               -If you have any pain, fever, or chills.  -If your foley catheter or suprapubic tube is not draining urine.  -If there is decreasing urinary stream.  This may indicate sloughing of some  dead tissue in the area of the prostate near the opening to the bladder.  It may clear on its own but,  if the problem becomes severe, it may require removal of the dead tissue through a cystoscope.  -If you have diarrhea after urination or foul-smelling  urine.  These   symptoms may indicate an urethrorectal fistula, which is a hole between the bladder channel and the rectum.  This should be investigated by your doctor.  -If you have any questions or problems.   Diet:  Resume your normal diet.   If you become constipated, you may try  over-the-counter remedies such as Milk of Magnesia.  If you are nauseated, vomiting or feel bloated, notify your doctor's office.  DO NOT GIVE YOURSELF ANY ENEMAS OR RECTAL MEDICATIONS.  THE RECTAL WALL IS THIN AFTER CRYOABLATION.  Activity:   -You may have swelling and bruising of the penis and scrotum.  Apply ice packs   to the area behind the scrotum intermittently for 24-48 hours after your surgery to keep the swelling down.  Wearing an athletic supporter (jock strap) might also help.  -Lying flat on your back will also help decrease the swelling.  You may find   when you are sitting up or walking around that the swelling increases.   -You will have  puncture wounds behind your scrotum making it painful to sit   down.  Use a hemorrhoid donut to sit on if needed.  -You may shower on the second day after surgery.  -There are no lifting or driving restrictions.  Use caution while the suprapubic tube   is in place.   Medications:  -You may resume your preoperative medications, except aspirin and other blood   thinning agents.  Unless otherwise instructed, resume your aspirin after your suprapubic tube is removed.  If you are taking Coumadin or other blood thinners, please discuss when to restart these medications with your surgeon.  -Unless otherwise ordered, you will be given prescriptions for the following   medications which you will need to take after your procedure:   *Antibiotics -  to prevent infection while your suprapubic tube is in place.   *Anti-inflammatory - to prevent pain and to decrease the inflammation in   the area of the prostate.  You may take ibuprofen (Motrin, Advil),    acetaminophen (Tylenol) or  naproxen (Aleve) as needed for discomfort.   Call your doctor's office at [phone] for an appointment   Patient Signature:  ________________________________________________________  Nurse's Signature:  ________________________________________________________      Post Anesthesia Home Care Instructions  Activity: Get plenty of rest for the remainder of the day. A responsible adult should stay with you for 24 hours following the procedure.  For the next 24 hours, DO NOT: -Drive a car -Paediatric nurse -Drink alcoholic beverages -Take any medication unless instructed by your physician -Make any legal decisions or sign important papers.  Meals: Start with liquid foods such as gelatin or soup. Progress to regular foods as tolerated. Avoid greasy, spicy, heavy foods. If nausea and/or vomiting occur, drink only clear liquids until the nausea and/or vomiting subsides. Call your physician if vomiting continues.  Special Instructions/Symptoms: Your throat may feel dry or sore from the anesthesia or the breathing tube placed in your throat during surgery. If this causes discomfort, gargle with warm salt water. The discomfort should disappear within 24 hours.  If you had a scopolamine patch placed behind your ear for the management of post- operative nausea and/or vomiting:  1. The medication in the patch is effective for 72 hours, after which it should be removed.  Wrap patch in a tissue and discard in the trash. Wash hands thoroughly with soap and water. 2. You may remove the patch earlier than 72 hours if you experience unpleasant side effects which may include dry mouth, dizziness or visual disturbances. 3. Avoid touching the patch. Wash your hands with soap and water after contact with the patch.   Indwelling Urinary Catheter Care You have been given a flexible tube (catheter) used to drain the bladder. Catheters are often used when a person has difficulty urinating due to blockage,  bleeding, infection, or inability to control bladder or bowel movements (incontinence). A catheter requires daily care to prevent infection and blockage. HOME CARE INSTRUCTIONS  Do the following to reduce the risk of infection. Antibiotic medicines cannot prevent infections. Limit the number of bacteria entering your bladder  Wash your hands for 2 minutes with soapy water before and after handling the catheter.  Wash your bottom and the entire catheter twice daily, as well as after each bowel movement. Wash the tip of the penis or just above the vaginal opening with soap and warm water, rinse, and then wash the rectal area. Always wash from front to back.  When changing from the leg bag to overnight bag or from the overnight bag to leg bag, thoroughly clean the end of the catheter where it connects to the tubing with an alcohol wipe.  Clean the leg bag and overnight bag daily after use.  Replace your drainage bags weekly.  Always keep the tubing and bag below the level of your bladder. This allows your urine to drain properly. Lifting the bag or tubing above the level of your bladder will cause dirty urine to flow back into your bladder. If you must briefly lift the bag higher than your bladder, pinch the catheter or tubing to prevent backflow.  Drink enough water and fluids to keep your urine clear or pale yellow, or as directed by your caregiver. This will flush bacteria out of the bladder. Protect tissues from injury  Attach the catheter to your leg so there is no tension on the catheter. Use adhesive tape or a leg strap. If you are using adhesive tape, remove any sticky residue left behind by the previous tape you used.  Place your leg bag on your lower leg. Fasten the straps securely and comfortably.  Do not remove the catheter yourself unless you have been instructed how to do so. Keep the urinary pathway open  Check throughout the day to be sure your catheter is working and urine is  draining freely. Make sure the tubing does not become kinked.  Do not let the drainage bag overfill. SEEK IMMEDIATE MEDICAL CARE IF:   The catheter becomes blocked. Urine is not draining.  Urine is leaking.  You have any pain.  You have a fever. Document Released: 10/04/2005 Document Revised: 09/20/2012 Document Reviewed: 03/05/2010 [facility] Patient Information 2015 [place], [place]. This information is not intended to replace advice given to you by your health care provider. Make sure you discuss any questions you have with your health care provider.

## 2015-06-02 NOTE — Anesthesia Postprocedure Evaluation (Signed)
  Anesthesia Post-op Note  Patient: Jeremiah Zhang  Procedure(s) Performed: Procedure(s) (LRB): CRYO ABLATION PROSTATE (N/A)  Patient Location: PACU  Anesthesia Type: General  Level of Consciousness: awake and alert   Airway and Oxygen Therapy: Patient Spontanous Breathing  Post-op Pain: mild  Post-op Assessment: Post-op Vital signs reviewed, Patient's Cardiovascular Status Stable, Respiratory Function Stable, Patent Airway and No signs of Nausea or vomiting  Last Vitals:  Filed Vitals:   06/02/15 1130  BP: 128/73  Pulse: 74  Temp:   Resp: 17    Post-op Vital Signs: stable   Complications: No apparent anesthesia complications

## 2015-06-02 NOTE — Addendum Note (Signed)
Addendum  created 06/02/15 1214 by Wanita Chamberlain, CRNA   Modules edited: Clinical Notes   Clinical Notes:  File: [id number]

## 2015-06-02 NOTE — Anesthesia Procedure Notes (Signed)
Procedure Name: LMA Insertion Date/Time: 06/02/2015 9:12 AM Performed by: Wanita Chamberlain Pre-anesthesia Checklist: Patient identified, Timeout performed, Emergency Drugs available, Suction available and Patient being monitored Patient Re-evaluated:Patient Re-evaluated prior to inductionOxygen Delivery Method: Circle system utilized Preoxygenation: Pre-oxygenation with 100% oxygen Intubation Type: IV induction Ventilation: Mask ventilation without difficulty LMA: LMA inserted LMA Size: 4.0 Number of attempts: 1 Airway Equipment and Method: Bite block Placement Confirmation: breath sounds checked- equal and bilateral and positive ETCO2 Tube secured with: Tape Dental Injury: Teeth and Oropharynx as per pre-operative assessment

## 2015-06-02 NOTE — Anesthesia Preprocedure Evaluation (Addendum)
Anesthesia Evaluation  Patient identified by MRN, date of birth, ID band Patient awake    Reviewed: Allergy & Precautions, H&P , NPO status , Patient's Chart, lab work & pertinent test results, reviewed documented beta blocker date and time   Airway Mallampati: II  TM Distance: <3 FB Neck ROM: full  Mouth opening: Limited Mouth Opening  Dental no notable dental hx. (+) Dental Advisory Given, Teeth Intact, Chipped,    Pulmonary sleep apnea ,  No C-Pap breath sounds clear to auscultation  Pulmonary exam normal       Cardiovascular Exercise Tolerance: Good hypertension, Pt. on medications and Pt. on home beta blockers Normal cardiovascular examRhythm:regular Rate:Normal     Neuro/Psych negative neurological ROS  negative psych ROS   GI/Hepatic negative GI ROS, Neg liver ROS, hiatal hernia, GERD-  Medicated and Controlled,  Endo/Other  diabetes, Well Controlled, Type 2, Oral Hypoglycemic Agents  Renal/GU negative Renal ROS  negative genitourinary   Musculoskeletal  (+) Arthritis -, Osteoarthritis,    Abdominal   Peds  Hematology negative hematology ROS (+)   Anesthesia Other Findings   Reproductive/Obstetrics negative OB ROS                      Anesthesia Physical Anesthesia Plan  ASA: III  Anesthesia Plan: General   Post-op Pain Management:    Induction: Intravenous  Airway Management Planned: LMA  Additional Equipment:   Intra-op Plan:   Post-operative Plan:   Informed Consent: I have reviewed the patients History and Physical, chart, labs and discussed the procedure including the risks, benefits and alternatives for the proposed anesthesia with the patient or authorized representative who has indicated his/her understanding and acceptance.   Dental Advisory Given  Plan Discussed with: CRNA and Surgeon  Anesthesia Plan Comments:         Anesthesia Quick Evaluation

## 2015-06-03 ENCOUNTER — Encounter (HOSPITAL_BASED_OUTPATIENT_CLINIC_OR_DEPARTMENT_OTHER): Payer: Self-pay | Admitting: Urology

## 2015-09-07 ENCOUNTER — Emergency Department
Admission: EM | Admit: 2015-09-07 | Discharge: 2015-09-07 | Disposition: A | Discharge status: Home / Self Care | Payer: No Typology Code available for payment source | Attending: Emergency Medicine | Admitting: Emergency Medicine

## 2015-09-07 ENCOUNTER — Emergency Department: Payer: No Typology Code available for payment source

## 2015-09-07 ENCOUNTER — Encounter: Payer: Self-pay | Admitting: Emergency Medicine

## 2015-09-07 DIAGNOSIS — I10 Essential (primary) hypertension: Secondary | ICD-10-CM | POA: Diagnosis not present

## 2015-09-07 DIAGNOSIS — Y9389 Activity, other specified: Secondary | ICD-10-CM | POA: Diagnosis not present

## 2015-09-07 DIAGNOSIS — A666 Bone and joint lesions of yaws: Secondary | ICD-10-CM | POA: Insufficient documentation

## 2015-09-07 DIAGNOSIS — Z7902 Long term (current) use of antithrombotics/antiplatelets: Secondary | ICD-10-CM | POA: Diagnosis not present

## 2015-09-07 DIAGNOSIS — S0001XA Abrasion of scalp, initial encounter: Secondary | ICD-10-CM | POA: Diagnosis not present

## 2015-09-07 DIAGNOSIS — Z8546 Personal history of malignant neoplasm of prostate: Secondary | ICD-10-CM | POA: Insufficient documentation

## 2015-09-07 DIAGNOSIS — Y92410 Unspecified street and highway as the place of occurrence of the external cause: Secondary | ICD-10-CM | POA: Diagnosis not present

## 2015-09-07 DIAGNOSIS — Z79899 Other long term (current) drug therapy: Secondary | ICD-10-CM | POA: Insufficient documentation

## 2015-09-07 DIAGNOSIS — S80212A Abrasion, left knee, initial encounter: Secondary | ICD-10-CM

## 2015-09-07 DIAGNOSIS — S0990XA Unspecified injury of head, initial encounter: Secondary | ICD-10-CM | POA: Diagnosis not present

## 2015-09-07 DIAGNOSIS — S20212A Contusion of left front wall of thorax, initial encounter: Secondary | ICD-10-CM | POA: Diagnosis not present

## 2015-09-07 DIAGNOSIS — S8002XA Contusion of left knee, initial encounter: Secondary | ICD-10-CM | POA: Insufficient documentation

## 2015-09-07 DIAGNOSIS — Y998 Other external cause status: Secondary | ICD-10-CM | POA: Insufficient documentation

## 2015-09-07 DIAGNOSIS — E119 Type 2 diabetes mellitus without complications: Secondary | ICD-10-CM | POA: Diagnosis not present

## 2015-09-07 DIAGNOSIS — C61 Malignant neoplasm of prostate: Secondary | ICD-10-CM

## 2015-09-07 DIAGNOSIS — M899 Disorder of bone, unspecified: Secondary | ICD-10-CM

## 2015-09-07 DIAGNOSIS — S90512A Abrasion, left ankle, initial encounter: Secondary | ICD-10-CM | POA: Insufficient documentation

## 2015-09-07 DIAGNOSIS — S8992XA Unspecified injury of left lower leg, initial encounter: Secondary | ICD-10-CM | POA: Diagnosis present
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
## 2015-09-07 NOTE — ED Notes (Signed)
Pt on blood thinners, xarelto, hx of DVT and TIA
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
## 2015-09-15 ENCOUNTER — Other Ambulatory Visit (HOSPITAL_COMMUNITY): Payer: Self-pay | Admitting: Urology

## 2015-09-15 DIAGNOSIS — C61 Malignant neoplasm of prostate: Secondary | ICD-10-CM

## 2015-09-22 ENCOUNTER — Encounter (HOSPITAL_COMMUNITY)
Admission: RE | Admit: 2015-09-22 | Discharge: 2015-09-22 | Disposition: A | Discharge status: Home / Self Care | Payer: Medicare Other | Source: Ambulatory Visit | Attending: Urology | Admitting: Urology

## 2015-09-22 ENCOUNTER — Ambulatory Visit (HOSPITAL_COMMUNITY)
Admission: RE | Admit: 2015-09-22 | Discharge: 2015-09-22 | Disposition: A | Discharge status: Home / Self Care | Payer: Medicare Other | Source: Ambulatory Visit | Attending: Urology | Admitting: Urology

## 2015-09-22 DIAGNOSIS — C61 Malignant neoplasm of prostate: Secondary | ICD-10-CM | POA: Insufficient documentation

## 2015-09-22 MED ORDER — TECHNETIUM TC 99M MEDRONATE IV KIT
27.1000 | PACK | Freq: Once | INTRAVENOUS | Status: AC | PRN
  Administered 2015-09-22: 27.1 via INTRAVENOUS

## 2015-09-30 ENCOUNTER — Other Ambulatory Visit (HOSPITAL_COMMUNITY): Payer: Self-pay | Admitting: Urology

## 2015-09-30 DIAGNOSIS — R948 Abnormal results of function studies of other organs and systems: Secondary | ICD-10-CM

## 2015-09-30 DIAGNOSIS — C61 Malignant neoplasm of prostate: Secondary | ICD-10-CM

## 2015-10-06 ENCOUNTER — Ambulatory Visit (HOSPITAL_COMMUNITY)
Admission: RE | Admit: 2015-10-06 | Discharge: 2015-10-06 | Disposition: A | Discharge status: Home / Self Care | Payer: Medicare Other | Source: Ambulatory Visit | Attending: Urology | Admitting: Urology

## 2015-10-06 DIAGNOSIS — M4807 Spinal stenosis, lumbosacral region: Secondary | ICD-10-CM | POA: Insufficient documentation

## 2015-10-06 DIAGNOSIS — C61 Malignant neoplasm of prostate: Secondary | ICD-10-CM | POA: Insufficient documentation

## 2015-10-06 DIAGNOSIS — M5136 Other intervertebral disc degeneration, lumbar region: Secondary | ICD-10-CM | POA: Insufficient documentation

## 2015-10-06 DIAGNOSIS — M4806 Spinal stenosis, lumbar region: Secondary | ICD-10-CM | POA: Insufficient documentation

## 2015-10-06 DIAGNOSIS — R948 Abnormal results of function studies of other organs and systems: Secondary | ICD-10-CM | POA: Diagnosis present

## 2015-10-06 LAB — POCT I-STAT CREATININE: Creatinine, Ser: 1.6 mg/dL — ABNORMAL HIGH (ref 0.61–1.24)

## 2015-10-06 MED ORDER — GADOBENATE DIMEGLUMINE 529 MG/ML IV SOLN
10.0000 mL | Freq: Once | INTRAVENOUS | Status: AC | PRN
  Administered 2015-10-06: 10 mL via INTRAVENOUS

## 2016-04-02 ENCOUNTER — Other Ambulatory Visit (HOSPITAL_COMMUNITY): Payer: Self-pay | Admitting: Urology

## 2016-04-02 DIAGNOSIS — C61 Malignant neoplasm of prostate: Secondary | ICD-10-CM

## 2016-05-12 ENCOUNTER — Ambulatory Visit (HOSPITAL_COMMUNITY)
Admission: RE | Admit: 2016-05-12 | Discharge: 2016-05-12 | Disposition: A | Discharge status: Home / Self Care | Payer: Medicare Other | Source: Ambulatory Visit | Attending: Urology | Admitting: Urology

## 2016-05-12 DIAGNOSIS — R938 Abnormal findings on diagnostic imaging of other specified body structures: Secondary | ICD-10-CM | POA: Insufficient documentation

## 2016-05-12 DIAGNOSIS — C61 Malignant neoplasm of prostate: Secondary | ICD-10-CM

## 2016-05-12 LAB — POCT I-STAT CREATININE: Creatinine, Ser: 1.2 mg/dL (ref 0.61–1.24)

## 2016-05-12 MED ORDER — GADOBENATE DIMEGLUMINE 529 MG/ML IV SOLN
20.0000 mL | Freq: Once | INTRAVENOUS | Status: AC | PRN
  Administered 2016-05-12: 20 mL via INTRAVENOUS

## 2016-06-10 ENCOUNTER — Other Ambulatory Visit (HOSPITAL_COMMUNITY): Payer: Self-pay | Admitting: Urology

## 2016-06-10 DIAGNOSIS — C61 Malignant neoplasm of prostate: Secondary | ICD-10-CM

## 2016-06-25 ENCOUNTER — Encounter (HOSPITAL_COMMUNITY)
Admission: RE | Admit: 2016-06-25 | Discharge: 2016-06-25 | Disposition: A | Discharge status: Home / Self Care | Payer: Medicare Other | Source: Ambulatory Visit | Attending: Urology | Admitting: Urology

## 2016-06-25 DIAGNOSIS — C61 Malignant neoplasm of prostate: Secondary | ICD-10-CM | POA: Diagnosis not present

## 2016-06-25 MED ORDER — TECHNETIUM TC 99M MEDRONATE IV KIT
21.2000 | PACK | Freq: Once | INTRAVENOUS | Status: AC | PRN
  Administered 2016-06-25: 21.2 via INTRAVENOUS

## 2016-07-13 ENCOUNTER — Encounter: Payer: Self-pay | Admitting: Radiation Oncology

## 2016-07-13 NOTE — Progress Notes (Signed)
GU Location of Tumor / Histology: prostatic adenocarcinoma  If Prostate Cancer, Gleason Score is (4 + 3) and PSA is (3.48) on May 2017  Tay R Lasater presented on 11/25/14 to Dr. Louis Meckel as a referral from Dr. Dimas Aguas for evaluation of an elevated PSA.  Biopsies of prostate (if applicable) revealed:  6  Past/Anticipated interventions by urology, if any: 03/25/15 prostate biopsy, 06/02/15 left focal cryotherapy, 06/07/16 prostate biopsy, Bone scan (lumbar arthritis), CT (neg), referral to discuss radiation therapy  Past/Anticipated interventions by medical oncology, if any: no  Weight changes, if any: no  Bowel/Bladder complaints, if any: ED. Constipation. Occasional urinary leakage and urgency. Nocturia x 1. Denies dysuria or hematuria.   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  SAFETY ISSUES:  Prior radiation? no  Pacemaker/ICD? no  Possible current pregnancy? no  Is the patient on methotrexate? no  Current Complaints / other details:  70 year old male. Married with two sons. Retired. Hx of TIA on anticoagulants.

## 2016-07-14 ENCOUNTER — Encounter: Payer: Self-pay | Admitting: Radiation Oncology

## 2016-07-14 ENCOUNTER — Ambulatory Visit
Admission: RE | Admit: 2016-07-14 | Discharge: 2016-07-14 | Disposition: A | Discharge status: Home / Self Care | Payer: Medicare Other | Source: Ambulatory Visit | Attending: Radiation Oncology | Admitting: Radiation Oncology

## 2016-07-14 VITALS — BP 124/78 | HR 68 | Resp 16 | Ht 70.0 in | Wt 237.6 lb

## 2016-07-14 DIAGNOSIS — K449 Diaphragmatic hernia without obstruction or gangrene: Secondary | ICD-10-CM | POA: Insufficient documentation

## 2016-07-14 DIAGNOSIS — Z7984 Long term (current) use of oral hypoglycemic drugs: Secondary | ICD-10-CM | POA: Diagnosis not present

## 2016-07-14 DIAGNOSIS — E785 Hyperlipidemia, unspecified: Secondary | ICD-10-CM | POA: Insufficient documentation

## 2016-07-14 DIAGNOSIS — E119 Type 2 diabetes mellitus without complications: Secondary | ICD-10-CM | POA: Diagnosis not present

## 2016-07-14 DIAGNOSIS — K219 Gastro-esophageal reflux disease without esophagitis: Secondary | ICD-10-CM | POA: Diagnosis not present

## 2016-07-14 DIAGNOSIS — G4733 Obstructive sleep apnea (adult) (pediatric): Secondary | ICD-10-CM | POA: Diagnosis not present

## 2016-07-14 DIAGNOSIS — Z96653 Presence of artificial knee joint, bilateral: Secondary | ICD-10-CM | POA: Insufficient documentation

## 2016-07-14 DIAGNOSIS — C61 Malignant neoplasm of prostate: Secondary | ICD-10-CM

## 2016-07-14 DIAGNOSIS — I10 Essential (primary) hypertension: Secondary | ICD-10-CM | POA: Diagnosis not present

## 2016-07-14 DIAGNOSIS — Z7901 Long term (current) use of anticoagulants: Secondary | ICD-10-CM | POA: Diagnosis not present

## 2016-07-14 DIAGNOSIS — Z510 Encounter for antineoplastic radiation therapy: Secondary | ICD-10-CM | POA: Diagnosis not present

## 2016-07-14 NOTE — Progress Notes (Signed)
Per [facility] appointment staff there is no follow up appointment currently scheduled for this patient with Dr. [name]. Shona Simpson, PA-C informed of this finding.

## 2016-07-14 NOTE — Progress Notes (Signed)
Radiation Oncology         [phone] ________________________________  Initial outpatient Consultation  Name: Jeremiah Zhang MRN: [id number]  Date: 07/14/2016  DOB: 09-06-1946  [id number], Jeremiah Bis, MD  Jeremiah Clines, MD   REFERRING PHYSICIAN: Carolan Clines, MD  DIAGNOSIS: The encounter diagnosis was Malignant neoplasm of prostate [facility]).    ICD-9-CM ICD-10-CM   1. Malignant neoplasm of prostate ([place]) [place] [facility] referral to Social Work    HISTORY OF PRESENT ILLNESS: Jeremiah Zhang is a 70 y.o. male with a new diagnosis of prostate cancer.  He was noted to have an elevated PSA of 4.14. Accordingly, he was referred for evaluation in urology and was seen by Dr. Louis Meckel in the spring of 2016, digital rectal examination was performed at that time revealing no palpable nodules. 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 He went on to receive focal cryotherapy with Dr. [name] on 06/02/15. He has been followed with PSAs since, and had a PSA of 3.48 in May 2017, and was 2.36 on 05/18/16. An MRI of the prostate on 05/12/16 revealed heterogeneous hypointensity throughout the peripheral zone of the base. No evidence of advanced spread was present. The patient proceeded to transrectal ultrasound with 12 biopsies of the prostate on 06/07/16.  The prostate volume measured 42.1 cc.  Out of 12 core biopsies, 5 were positive.  The maximum Gleason score was 4+3, and this was seen in the left later base, left mid base, and left mid gland, there was a 3+4 core in the left later mid gland, and a 3+3 in the right lateral base. He had a bone scan and CT of the abdomen and pelvis on 06/25/16 which did not reveal concerns for metastatic disease.  The patient reviewed the biopsy results with his urologist and he has kindly been referred today for discussion of potential radiation treatment  options.   PREVIOUS RADIATION THERAPY: No  PAST MEDICAL HISTORY:  Past Medical History:  Diagnosis Date  . Arthritis   . GERD (gastroesophageal reflux disease)   . History of hiatal hernia   . Hyperlipidemia   . Hypertension   . OSA (obstructive sleep apnea)    per pt positive sleep study yrs ago recommended CPAP --  pt Non- compliant -- states uses Breath Rite Nasal Strips  . Prostate cancer (Jeremiah Zhang)   . Type 2 diabetes mellitus ([place])   . Wears glasses   . Wears hearing aid    bilateral      PAST SURGICAL HISTORY: Past Surgical History:  Procedure Laterality Date  . CRYOABLATION N/A 06/02/2015   Procedure: CRYO ABLATION PROSTATE;  Surgeon: Jeremiah Clines, MD;  Location: [facility];  Service: Urology;  Laterality: N/A;  . ELBOW BURSA SURGERY Left Feb 1997  . SHOULDER OPEN ROTATOR CUFF REPAIR Left 09-04-2014  . TOTAL KNEE ARTHROPLASTY Bilateral right 08-13-2006  &  left 11-14-2008  . UMBILICAL HERNIA REPAIR  06-23-2012    FAMILY HISTORY:  Family History  Problem Relation Age of Onset  . Cancer Mother 61    colon/resection    SOCIAL HISTORY:  Social History   Social History  . Marital status: Married    Spouse name: N/A  . Number of children: N/A  . Years of education: N/A   Occupational History  . Not on file.   Social History Main Topics  . Smoking  status: Never Smoker  . Smokeless tobacco: Never Used  . Alcohol use No  . Drug use: No  . Sexual activity: Not Currently   Other Topics Concern  . Not on file   Social History Narrative  . No narrative on file  The patient is married and resides in [place]. He is a retired Chief Operating Officer, he has two adult sons.  ALLERGIES: Bee venom; Crestor [rosuvastatin]; Vytorin [ezetimibe-simvastatin]; and Dynapen [dicloxacillin]  MEDICATIONS:  Current Outpatient Prescriptions  Medication Sig Dispense Refill  . Apixaban (ELIQUIS PO) Take by mouth.    . Ascorbic Acid  (VITAMIN C) 1000 MG tablet Take 1,000 mg by mouth daily.    . bisoprolol-hydrochlorothiazide (ZIAC) 10-6.25 MG per tablet Take 1 tablet by mouth every morning.    . Cholecalciferol (VITAMIN D3) 2000 UNITS TABS Take 1 capsule by mouth daily.    Marland Kitchen esomeprazole (NEXIUM) 40 MG capsule Take 40 mg by mouth 2 (two) times daily before a meal.    . furosemide (LASIX) 40 MG tablet Take 40 mg by mouth every morning.    . metFORMIN (GLUCOPHAGE) 1000 MG tablet Take 500 mg by mouth 2 (two) times daily with a meal.     . Multiple Vitamin (MULTIVITAMIN) tablet Take 1 tablet by mouth every morning.    . quinapril (ACCUPRIL) 20 MG tablet Take 20 mg by mouth 2 (two) times daily.    . Red Yeast Rice Extract (RED YEAST RICE PO) Take by mouth 2 (two) times daily.    Marland Kitchen EPINEPHrine 0.3 mg/0.3 mL IJ SOAJ injection Inject into the muscle as needed.     No current facility-administered medications for this encounter.     REVIEW OF SYSTEMS:  On review of systems, the patient reports that he is doing well overall. He denies any chest pain, shortness of breath, cough, fevers, chills, night sweats, unintended weight changes. He denies any bowel or bladder disturbances, and denies abdominal pain, nausea or vomiting. His IPSS score is 2, and he reports poor confidence in erections to complete sexual activity. He denies any new musculoskeletal or joint aches or pains. A complete review of systems is obtained and is otherwise negative.    PHYSICAL EXAM:  height is 5\' 10"  (1.778 m) and weight is 237 lb 9.6 oz (107.8 kg). His blood pressure is 124/78 and his pulse is 68. His respiration is 16 and oxygen saturation is 100%.   Pain Scale 0/10 In general this is a well appearing Caucasian male in no acute distress. He is alert and oriented x4 and appropriate throughout the examination. HEENT reveals that the patient is normocephalic, atraumatic. EOMs are intact.  Skin is intact without any evidence of gross lesions. Cardiovascular exam  reveals a regular rate and rhythm, no clicks rubs or murmurs are auscultated. Chest is clear to auscultation bilaterally. Lymphatic assessment is performed and does not reveal any adenopathy in the cervical, supraclavicular, or axillary chains. Abdomen has active bowel sounds in all quadrants and is intact. The abdomen is soft, non tender, non distended. Lower extremities are negative for pretibial pitting edema, cyanosis or clubbing.   KPS = 100  100 - Normal; no complaints; no evidence of disease. 90   - Able to carry on normal activity; minor signs or symptoms of disease. 80   - Normal activity with effort; some signs or symptoms of disease. 96   - Cares for self; unable to carry on normal activity or to do active work.  60   - Requires occasional assistance, but is able to care for most of his personal needs. 50   - Requires considerable assistance and frequent medical care. 36   - Disabled; requires special care and assistance. 7   - Severely disabled; hospital admission is indicated although death not imminent. 64   - Very sick; hospital admission necessary; active supportive treatment necessary. 10   - Moribund; fatal processes progressing rapidly. 0     - Dead  Karnofsky DA, Abelmann [place], Craver LS and Burchenal [facility] [phone]) The use of the nitrogen mustards in the palliative treatment of carcinoma: with particular reference to bronchogenic carcinoma Cancer 1 [phone]  LABORATORY DATA:  Lab Results  Component Value Date   WBC 5.0 04/11/2012   HGB 15.0 06/02/2015   HCT 44.0 06/02/2015   MCV 93 04/11/2012   PLT 176 04/11/2012   Lab Results  Component Value Date   NA 141 06/02/2015   K 3.7 06/02/2015   CL 100 (L) 06/02/2015   CO2 30 08/27/2014   Lab Results  Component Value Date   ALT 34 04/11/2012   AST 23 04/11/2012   ALKPHOS 70 04/11/2012   BILITOT 0.7 04/11/2012     RADIOGRAPHY: Nm Bone Scan Whole Body  Result Date: 06/25/2016 CLINICAL DATA:  Prostate cancer, history of  BILATERAL knee replacements 5 years ago EXAM: [place] SCAN TECHNIQUE: Whole body anterior and posterior images were obtained approximately 3 hours after intravenous injection of radiopharmaceutical. RADIOPHARMACEUTICALS:  21.2 mCi Technetium-91m MDP IV COMPARISON:  10/22/2014 Correlation: CT abdomen and pelvis 06/25/2016 FINDINGS: Photopenic defects at both knees from prior knee replacements. Mild uptake at the shoulders, RIGHT hand, RIGHT elbow, and feet, typically degenerative. 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 Noted new abnormal areas of increased tracer localization are identified which are suspicious for osseous metastatic disease. Uptake seen previously in the posterior LEFT eleventh rib is no longer identified. IMPRESSION: Scattered degenerative type uptake as above. Stable uptake in lumbar spine since 09/22/2015, question due to degenerative disc/facet disease changes and scoliosis present on CT. No new scintigraphic abnormalities identified to suggest osseous metastases. Electronically Signed   By: Lavonia Dana M.D.   On: 06/25/2016 15:16      IMPRESSION/PLAN: 1. 70 y.o. 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 Dr. [name] discusses the pathology findings from each of his biopsies, the imaging studies to date, and reviews the options for treatment of intermediate risk prostate cancer. He discusses the role for radiotherapy in the treatment of the patient's cancer. He discusses the options for 8 weeks of external radiotherapy, versus 5 weeks of external radiotherapy with a radioactive seed implant as a boost. At this time due to the volume of his cancer, Dr. [name] would not recommend  brachytherapy alone. He discusses the risks, benefits, short, and long term effects of each treatment. He would like to move forward with 5 weeks of external radiotherapy and radioactive seed boost. We discussed that we would send the patient back to Dr. [name] for gold fiducial markers, and then return for simulation following this.     The above documentation reflects my direct findings during this shared patient visit. Please see the separate note by Dr.  [name] on this date for the remainder of the patient's plan of care.   Carola Rhine, PAC

## 2016-07-14 NOTE — Progress Notes (Signed)
See progress note under physician encounter. 

## 2016-07-15 ENCOUNTER — Telehealth: Payer: Self-pay | Admitting: *Deleted

## 2016-07-15 NOTE — Telephone Encounter (Signed)
Called patient to inform of gold seed placement on 08-02-16 @ 10:30 am @ [facility] and his sim on 08-05-16 @ 8:30 am @ Dr. Johny Shears Office, spoke with patient and he is aware of these appts.

## 2016-07-19 ENCOUNTER — Ambulatory Visit: Payer: Medicare Other | Admitting: Radiation Oncology

## 2016-07-30 ENCOUNTER — Other Ambulatory Visit (INDEPENDENT_AMBULATORY_CARE_PROVIDER_SITE_OTHER): Payer: Self-pay | Admitting: Vascular Surgery

## 2016-08-03 DIAGNOSIS — E119 Type 2 diabetes mellitus without complications: Secondary | ICD-10-CM | POA: Insufficient documentation

## 2016-08-04 NOTE — Progress Notes (Signed)
  Radiation Oncology         [phone] ________________________________  Name: Jeremiah Zhang MRN: [id number]  Date: 08/05/2016  DOB: 1945-10-31  SIMULATION AND TREATMENT PLANNING NOTE    ICD-9-CM ICD-10-CM   1. Malignant neoplasm of prostate ([place]) 185 C61     DIAGNOSIS:  69 y.o. 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  Identity was confirmed.  All relevant records and images related to the planned course of therapy were reviewed.  The patient freely provided informed written consent to proceed with treatment after reviewing the details related to the planned course of therapy. The consent form was witnessed and verified by the simulation staff.  Then, the patient was set-up in a stable reproducible supine position for radiation therapy.  A vacuum lock pillow device was custom fabricated to position his legs in a reproducible immobilized position.  Then, I performed a urethrogram under sterile conditions to identify the prostatic apex.  CT images were obtained.  Surface markings were placed.  The CT images were loaded into the planning software.  Then the prostate target and avoidance structures including the rectum, bladder, bowel and hips were contoured.  Treatment planning then occurred.  The radiation prescription was entered and confirmed.  A total of 1 complex treatment devices were fabricated. I have requested : 3D Simulation  I have requested a DVH of the following structures: rectum, bladder, left hip, right hip, prostate.  COMPLEX SIMULATION:  The patient presented today for evaluation for possible prostate seed implant boost. So, using three-dimensional radiation planning tools I reconstructed the prostate in view of the structures from the transperineal needle pathway to assess for possible pubic arch interference. In doing so, I did not  appreciate any pubic arch interference. Also, the patient's prostate volume was estimated based on the drawn structure. The volume was 57 cc.  Given the pubic arch appearance and prostate volume, patient remains a good candidate to proceed with prostate seed implant boost    PLAN:  The patient will receive 45 Gy in 25 fractions then seed implant boost ________________________________  Sheral Apley. [name], M.D.

## 2016-08-05 ENCOUNTER — Ambulatory Visit
Admission: RE | Admit: 2016-08-05 | Discharge: 2016-08-05 | Disposition: A | Discharge status: Home / Self Care | Payer: Medicare Other | Source: Ambulatory Visit | Attending: Radiation Oncology | Admitting: Radiation Oncology

## 2016-08-05 DIAGNOSIS — Z510 Encounter for antineoplastic radiation therapy: Secondary | ICD-10-CM | POA: Diagnosis not present

## 2016-08-05 DIAGNOSIS — C61 Malignant neoplasm of prostate: Secondary | ICD-10-CM

## 2016-08-07 ENCOUNTER — Other Ambulatory Visit (INDEPENDENT_AMBULATORY_CARE_PROVIDER_SITE_OTHER): Payer: Self-pay | Admitting: Vascular Surgery

## 2016-08-09 DIAGNOSIS — Z510 Encounter for antineoplastic radiation therapy: Secondary | ICD-10-CM | POA: Diagnosis not present

## 2016-08-09 NOTE — Telephone Encounter (Signed)
PATIENT CALLING REQUESTED THAT AN ELIQUIS Rx BE SENT IN.

## 2016-08-16 ENCOUNTER — Ambulatory Visit
Admission: RE | Admit: 2016-08-16 | Discharge: 2016-08-16 | Disposition: A | Discharge status: Home / Self Care | Payer: Medicare Other | Source: Ambulatory Visit | Attending: Radiation Oncology | Admitting: Radiation Oncology

## 2016-08-16 DIAGNOSIS — Z510 Encounter for antineoplastic radiation therapy: Secondary | ICD-10-CM | POA: Diagnosis not present

## 2016-08-17 ENCOUNTER — Encounter: Payer: Self-pay | Admitting: Radiation Oncology

## 2016-08-17 ENCOUNTER — Ambulatory Visit
Admission: RE | Admit: 2016-08-17 | Discharge: 2016-08-17 | Disposition: A | Discharge status: Home / Self Care | Payer: Medicare Other | Source: Ambulatory Visit | Attending: Radiation Oncology | Admitting: Radiation Oncology

## 2016-08-17 ENCOUNTER — Telehealth: Payer: Self-pay | Admitting: Radiation Oncology

## 2016-08-17 DIAGNOSIS — Z510 Encounter for antineoplastic radiation therapy: Secondary | ICD-10-CM | POA: Diagnosis not present

## 2016-08-17 NOTE — Telephone Encounter (Signed)
Received EPIC inbasket message from patient inquiring about water aerobics while receiving radiation therapy for prostate ca. Phoned patient's home. Spoke with his wife, Lelan Pons. Explained it is safe for him to participate in water aerobics during radiation. Explained regular exercise helps to fight fatigue caused by xrt. Strongly encouraged her to have him shower immediately after. Explained that should any dry itchy skin occur within the treatment field we may at that time need to evaluate the frequency or provide him with radiation approved lotion. Explained that any scheduling changes reference treatment times would need to be addressed with his radiation therapist.She verbalized understanding and expressed appreciation for the call.

## 2016-08-18 ENCOUNTER — Ambulatory Visit
Admission: RE | Admit: 2016-08-18 | Discharge: 2016-08-18 | Disposition: A | Discharge status: Home / Self Care | Payer: Medicare Other | Source: Ambulatory Visit | Attending: Radiation Oncology | Admitting: Radiation Oncology

## 2016-08-18 DIAGNOSIS — Z510 Encounter for antineoplastic radiation therapy: Secondary | ICD-10-CM | POA: Diagnosis not present

## 2016-08-19 ENCOUNTER — Ambulatory Visit
Admission: RE | Admit: 2016-08-19 | Discharge: 2016-08-19 | Disposition: A | Discharge status: Home / Self Care | Payer: Medicare Other | Source: Ambulatory Visit | Attending: Radiation Oncology | Admitting: Radiation Oncology

## 2016-08-19 ENCOUNTER — Encounter: Payer: Self-pay | Admitting: *Deleted

## 2016-08-19 DIAGNOSIS — Z510 Encounter for antineoplastic radiation therapy: Secondary | ICD-10-CM | POA: Diagnosis not present

## 2016-08-19 NOTE — Progress Notes (Signed)
[place] Psychosocial Distress Screening Clinical Social Work  Clinical Social Work was referred by distress screening protocol.  The patient scored a 6 on the Psychosocial Distress Thermometer which indicates moderate distress. Clinical Social Worker phoned pt at home to assess for distress and other psychosocial needs. Pt shared some concerns about insurance coverage, but has met with Ailene Ravel and called his insurance company. Pt very open about many stressors re. Health concerns for both him and his wife. Pt appears to have good coping and shared hobbies that assist his coping. Pt very open to discussing emotions and needed to process all of his recent health issues. CSW reviewed at [employer] and resources to assist with coping. CSW to prepare packet for pt with these resources and leave for pt at [address] 1 in the morning prior to treatment.   ONCBCN DISTRESS SCREENING 07/14/2016  Screening Type Initial Screening  Distress experienced in past week (1-10) 6  Emotional problem type Depression;Nervousness/Anxiety  Spiritual/Religous concerns type Loss of sense of purpose  Information Concerns Type Lack of info about diagnosis;Lack of info about treatment;Lack of info about complementary therapy choices;Lack of info about maintaining fitness  Physical Problem type Sleep/insomnia;Sexual problems  Physician notified of physical symptoms Yes  Referral to clinical psychology No  Referral to clinical social work Yes  Referral to dietition No  Referral to financial advocate No  Referral to support programs No  Referral to palliative care No    Clinical Social Worker follow up needed: Yes.    If yes, follow up plan: See above Loren Racer, [place] Worker [place]  [facility] Phone: [phone] Fax: [phone]

## 2016-08-20 ENCOUNTER — Ambulatory Visit
Admission: RE | Admit: 2016-08-20 | Discharge: 2016-08-20 | Disposition: A | Discharge status: Home / Self Care | Payer: Medicare Other | Source: Ambulatory Visit | Attending: Radiation Oncology | Admitting: Radiation Oncology

## 2016-08-20 ENCOUNTER — Encounter: Payer: Self-pay | Admitting: Radiation Oncology

## 2016-08-20 VITALS — BP 122/73 | HR 68 | Temp 98.1°F | Resp 18 | Ht 70.0 in | Wt 234.2 lb

## 2016-08-20 DIAGNOSIS — Z510 Encounter for antineoplastic radiation therapy: Secondary | ICD-10-CM | POA: Diagnosis not present

## 2016-08-20 DIAGNOSIS — C61 Malignant neoplasm of prostate: Secondary | ICD-10-CM | POA: Insufficient documentation

## 2016-08-20 NOTE — Addendum Note (Signed)
Encounter addended by: Malena Edman, RN on: 08/20/2016  2:16 PM<BR>    Actions taken: Patient Education assessment filed

## 2016-08-20 NOTE — Progress Notes (Signed)
Weight and vital signs are stable.   P t here for patient teaching.  Pt given Radiation and You booklet. Pt reports they have not watched the Radiation Therapy Education video given the link will watch at home on  .  Reviewed areas of pertinence such as diarrhea and fatigue,urinary frequency,urinary urgency and urinary burning . Pt able to give teach back of have Imodium on hand and drink plenty of water. Reports that he is emptying his bladder has a strong urinary stream,denies diarrhea,dysuria,hematuriafrequency and urinary urgency. Wt Readings from Last 3 Encounters:  08/20/16 234 lb 3.2 oz (106.2 kg)  07/14/16 237 lb 9.6 oz (107.8 kg)  09/07/15 233 lb (105.7 kg)  BP 122/73   Pulse 68   Temp 98.1 F (36.7 C) (Oral)   Resp 18   Ht 5\' 10"  (1.778 m)   Wt 234 lb 3.2 oz (106.2 kg)   SpO2 97%   BMI 33.60 kg/m

## 2016-08-20 NOTE — Progress Notes (Signed)
Department of Radiation Oncology  Phone:  [phone] Fax:        [phone]  Weekly Treatment Note    Name: Jeremiah Zhang Date: 08/20/2016 MRN: [id number] DOB: December 17, 1945   Diagnosis:     ICD-9-CM ICD-10-CM   1. 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 Apixaban (ELIQUIS PO) Take by mouth.    . Ascorbic Acid (VITAMIN C) 1000 MG tablet Take 1,000 mg by mouth daily.    . bisoprolol-hydrochlorothiazide (ZIAC) 10-6.25 MG per tablet Take 1 tablet by mouth every morning.    . Cholecalciferol (VITAMIN D3) 2000 UNITS TABS Take 1 capsule by mouth daily.    Marland Kitchen esomeprazole (NEXIUM) 40 MG capsule Take 40 mg by mouth 2 (two) times daily before a meal.    . furosemide (LASIX) 40 MG tablet Take 40 mg by mouth every morning.    . metFORMIN (GLUCOPHAGE) 1000 MG tablet Take 500 mg by mouth 2 (two) times daily with a meal.     . Multiple Vitamin (MULTIVITAMIN) tablet Take 1 tablet by mouth every morning.    . quinapril (ACCUPRIL) 20 MG tablet Take 20 mg by mouth 2 (two) times daily.    . Red Yeast Rice Extract (RED YEAST RICE PO) Take by mouth 2 (two) times daily.    Marland Kitchen EPINEPHrine 0.3 mg/0.3 mL IJ SOAJ injection Inject into the muscle as needed.     No current facility-administered medications for this encounter.      ALLERGIES: Bee venom; Crestor [rosuvastatin]; Vytorin [ezetimibe-simvastatin]; and Dynapen [dicloxacillin]   LABORATORY DATA:  Lab Results  Component Value Date   WBC 5.0 04/11/2012   HGB 15.0 06/02/2015   HCT 44.0 06/02/2015   MCV 93 04/11/2012   PLT 176 04/11/2012   Lab Results  Component Value Date   NA 141 06/02/2015   K 3.7 06/02/2015   CL 100 (L) 06/02/2015   CO2 30 08/27/2014   Lab Results  Component Value Date   ALT 34 04/11/2012   AST 23 04/11/2012   ALKPHOS 70 04/11/2012   BILITOT 0.7 04/11/2012      NARRATIVE: Jeremiah Zhang was seen today for weekly treatment management. The chart was checked and the patient's films were reviewed.  Weight and vital signs are stable.   P t here for patient teaching.  Pt given Radiation and You booklet. Pt reports they have not watched the Radiation Therapy Education video given the link will watch at home on  .  Reviewed areas of pertinence such as diarrhea and fatigue,urinary frequency,urinary urgency and urinary burning . Pt able to give teach back of have Imodium on hand and drink plenty of water. Reports that he is emptying his bladder has a strong urinary stream,denies diarrhea,dysuria,hematuriafrequency and urinary urgency. Wt Readings from Last 3 Encounters:  08/20/16 234 lb 3.2 oz (106.2 kg)  07/14/16 237 lb 9.6 oz (107.8 kg)  09/07/15 233 lb (105.7 kg)  BP 122/73   Pulse 68   Temp 98.1 F (36.7 C) (Oral)   Resp 18   Ht 5\' 10"  (1.778 m)   Wt 234 lb 3.2 oz (106.2 kg)   SpO2 97%   BMI 33.60 kg/m      PHYSICAL EXAMINATION: height is 5\' 10"  (1.778 m) and weight is 234 lb 3.2 oz (106.2 kg). His oral temperature is 98.1 F (  36.7 C). His blood pressure is 122/73 and his pulse is 68. His respiration is 18 and oxygen saturation is 97%.        ASSESSMENT: The patient is doing satisfactorily with treatment.  PLAN: We will continue with the patient's radiation treatment as planned.

## 2016-08-23 ENCOUNTER — Ambulatory Visit
Admission: RE | Admit: 2016-08-23 | Discharge: 2016-08-23 | Disposition: A | Discharge status: Home / Self Care | Payer: Medicare Other | Source: Ambulatory Visit | Attending: Radiation Oncology | Admitting: Radiation Oncology

## 2016-08-23 DIAGNOSIS — Z510 Encounter for antineoplastic radiation therapy: Secondary | ICD-10-CM | POA: Diagnosis not present

## 2016-08-24 ENCOUNTER — Ambulatory Visit
Admission: RE | Admit: 2016-08-24 | Discharge: 2016-08-24 | Disposition: A | Discharge status: Home / Self Care | Payer: Medicare Other | Source: Ambulatory Visit | Attending: Radiation Oncology | Admitting: Radiation Oncology

## 2016-08-24 ENCOUNTER — Encounter: Payer: Self-pay | Admitting: Medical Oncology

## 2016-08-24 DIAGNOSIS — Z510 Encounter for antineoplastic radiation therapy: Secondary | ICD-10-CM | POA: Diagnosis not present

## 2016-08-25 ENCOUNTER — Ambulatory Visit
Admission: RE | Admit: 2016-08-25 | Discharge: 2016-08-25 | Disposition: A | Discharge status: Home / Self Care | Payer: Medicare Other | Source: Ambulatory Visit | Attending: Radiation Oncology | Admitting: Radiation Oncology

## 2016-08-25 DIAGNOSIS — Z510 Encounter for antineoplastic radiation therapy: Secondary | ICD-10-CM | POA: Diagnosis not present

## 2016-08-26 ENCOUNTER — Ambulatory Visit
Admission: RE | Admit: 2016-08-26 | Discharge: 2016-08-26 | Disposition: A | Discharge status: Home / Self Care | Payer: Medicare Other | Source: Ambulatory Visit | Attending: Radiation Oncology | Admitting: Radiation Oncology

## 2016-08-26 ENCOUNTER — Encounter: Payer: Self-pay | Admitting: Radiation Oncology

## 2016-08-26 DIAGNOSIS — Z510 Encounter for antineoplastic radiation therapy: Secondary | ICD-10-CM | POA: Diagnosis not present

## 2016-08-27 ENCOUNTER — Ambulatory Visit
Admission: RE | Admit: 2016-08-27 | Discharge: 2016-08-27 | Disposition: A | Discharge status: Home / Self Care | Payer: Medicare Other | Source: Ambulatory Visit | Attending: Radiation Oncology | Admitting: Radiation Oncology

## 2016-08-27 ENCOUNTER — Encounter: Payer: Self-pay | Admitting: Radiation Oncology

## 2016-08-27 ENCOUNTER — Other Ambulatory Visit: Payer: Self-pay | Admitting: Urology

## 2016-08-27 VITALS — BP 133/74 | HR 74 | Temp 98.4°F | Resp 20 | Ht 70.0 in | Wt 238.2 lb

## 2016-08-27 DIAGNOSIS — Z510 Encounter for antineoplastic radiation therapy: Secondary | ICD-10-CM | POA: Diagnosis not present

## 2016-08-27 DIAGNOSIS — C61 Malignant neoplasm of prostate: Secondary | ICD-10-CM

## 2016-08-27 NOTE — Progress Notes (Signed)
  Radiation Oncology         [phone]   Name: Jeremiah Zhang MRN: [id number]   Date: 08/27/2016  DOB: [birth year]   Weekly Radiation Therapy Management    ICD-9-CM ICD-10-CM   1. Malignant neoplasm of prostate ([facility]) 185 C61     Current Dose: 18 Gy  Planned Dose:  45 Gy  Narrative The patient presents for routine under treatment assessment.  Weight and vital signs are stable. Reports that he is emptying his bladder and has a strong urinary stream. Denies diarrhea, dysuria, hematuria, frequency, or urinary urgency. The patient is without complaint.  Set-up films were reviewed. The chart was checked.  Physical Findings  height is 5\' 10"  (1.778 m) and weight is 238 lb 3.2 oz (108 kg). His oral temperature is 98.4 F (36.9 C). His blood pressure is 133/74 and his pulse is 74. His respiration is 20 and oxygen saturation is 95%. . Weight essentially stable.  No significant changes.  Impression The patient is tolerating radiation.  Plan Continue treatment as planned.         Sheral Apley [name], M.D.  This document serves as a record of services personally performed by Tyler Pita, MD. It was created on his behalf by Darcus Austin, a trained medical scribe. The creation of this record is based on the scribe's personal observations and the provider's statements to them. This document has been checked and approved by the attending provider.

## 2016-08-27 NOTE — Progress Notes (Signed)
Weight and vital signs are stable.   Reports that he is emptying his bladder has a strong urinary stream,denies diarrhea,dysuria,hematuria, frequency and urinary urgency. Wt Readings from Last 3 Encounters:  08/27/16 238 lb 3.2 oz (108 kg)  08/20/16 234 lb 3.2 oz (106.2 kg)  07/14/16 237 lb 9.6 oz (107.8 kg)  BP 133/74   Pulse 74   Temp 98.4 F (36.9 C) (Oral)   Resp 20   Ht 5\' 10"  (1.778 m)   Wt 238 lb 3.2 oz (108 kg)   SpO2 95%   BMI 34.18 kg/m

## 2016-08-30 ENCOUNTER — Ambulatory Visit
Admission: RE | Admit: 2016-08-30 | Discharge: 2016-08-30 | Disposition: A | Discharge status: Home / Self Care | Payer: Medicare Other | Source: Ambulatory Visit | Attending: Radiation Oncology | Admitting: Radiation Oncology

## 2016-08-30 DIAGNOSIS — Z510 Encounter for antineoplastic radiation therapy: Secondary | ICD-10-CM | POA: Diagnosis not present

## 2016-08-31 ENCOUNTER — Ambulatory Visit
Admission: RE | Admit: 2016-08-31 | Discharge: 2016-08-31 | Disposition: A | Discharge status: Home / Self Care | Payer: Medicare Other | Source: Ambulatory Visit | Attending: Radiation Oncology | Admitting: Radiation Oncology

## 2016-08-31 DIAGNOSIS — Z510 Encounter for antineoplastic radiation therapy: Secondary | ICD-10-CM | POA: Diagnosis not present

## 2016-09-01 ENCOUNTER — Ambulatory Visit
Admission: RE | Admit: 2016-09-01 | Discharge: 2016-09-01 | Disposition: A | Discharge status: Home / Self Care | Payer: Medicare Other | Source: Ambulatory Visit | Attending: Radiation Oncology | Admitting: Radiation Oncology

## 2016-09-01 DIAGNOSIS — Z510 Encounter for antineoplastic radiation therapy: Secondary | ICD-10-CM | POA: Diagnosis not present

## 2016-09-02 ENCOUNTER — Ambulatory Visit
Admission: RE | Admit: 2016-09-02 | Discharge: 2016-09-02 | Disposition: A | Discharge status: Home / Self Care | Payer: Medicare Other | Source: Ambulatory Visit | Attending: Radiation Oncology | Admitting: Radiation Oncology

## 2016-09-02 VITALS — BP 123/78 | HR 68 | Resp 18 | Wt 235.8 lb

## 2016-09-02 DIAGNOSIS — Z510 Encounter for antineoplastic radiation therapy: Secondary | ICD-10-CM | POA: Diagnosis not present

## 2016-09-02 DIAGNOSIS — C61 Malignant neoplasm of prostate: Secondary | ICD-10-CM

## 2016-09-02 NOTE — Progress Notes (Signed)
Vitals stable. Three pound weight loss noted. Denies pain. Denies nocturia, hematuria, dysuria or frequency. Reports occasional urgency with a very small amount of leakage. Describes a strong steady urine stream without difficulty emptying his bowel. Reports consistency of stool has changed from hard to soft. Reports one soft bowel movement each day. Denies diarrhea. Reports mild fatigue.   BP 123/78 (BP Location: Left Arm, Patient Position: Sitting, Cuff Size: Normal)   Pulse 68   Resp 18   Wt 235 lb 12.8 oz (107 kg)   SpO2 100%   BMI 33.83 kg/m  Wt Readings from Last 3 Encounters:  09/02/16 235 lb 12.8 oz (107 kg)  08/27/16 238 lb 3.2 oz (108 kg)  08/20/16 234 lb 3.2 oz (106.2 kg)

## 2016-09-02 NOTE — Progress Notes (Signed)
  Radiation Oncology         [phone]   Name: Jeremiah Zhang MRN: [id number]   Date: 09/02/2016  DOB: Apr 05, 1946   Weekly Radiation Therapy Management    ICD-9-CM ICD-10-CM   1. Malignant neoplasm of prostate ([facility]) 185 C61     Current Dose: 25.2 Gy  Planned Dose:  45 Gy  Narrative The patient presents for routine under treatment assessment.  Denies pain, nocturia, hematuria, dysuria, diarrhea, or frequency. Reports occasional urgency with a very small amount of leakage. Describes a strong steady urine stream without difficulty emptying his bowel. Reports consistency of stool has changed from hard to soft, with one soft bowel movement each day. Reports mild fatigue.  Set-up films were reviewed. The chart was checked.  Physical Findings  weight is 235 lb 12.8 oz (107 kg). His blood pressure is 123/78 and his pulse is 68. His respiration is 18 and oxygen saturation is 100%. . Weight essentially stable.  No significant changes.  Impression The patient is tolerating radiation.  Plan Continue treatment as planned.         Sheral Apley [name], M.D.  This document serves as a record of services personally performed by Tyler Pita, MD. It was created on his behalf by Darcus Austin, a trained medical scribe. The creation of this record is based on the scribe's personal observations and the provider's statements to them. This document has been checked and approved by the attending provider.

## 2016-09-03 ENCOUNTER — Ambulatory Visit
Admission: RE | Admit: 2016-09-03 | Discharge: 2016-09-03 | Disposition: A | Discharge status: Home / Self Care | Payer: Medicare Other | Source: Ambulatory Visit | Attending: Radiation Oncology | Admitting: Radiation Oncology

## 2016-09-03 DIAGNOSIS — Z510 Encounter for antineoplastic radiation therapy: Secondary | ICD-10-CM | POA: Diagnosis not present

## 2016-09-05 ENCOUNTER — Encounter: Payer: Self-pay | Admitting: Radiation Oncology

## 2016-09-05 ENCOUNTER — Ambulatory Visit
Admission: RE | Admit: 2016-09-05 | Discharge: 2016-09-05 | Disposition: A | Discharge status: Home / Self Care | Payer: Medicare Other | Source: Ambulatory Visit | Attending: Radiation Oncology | Admitting: Radiation Oncology

## 2016-09-05 VITALS — BP 120/97 | HR 81 | Temp 98.7°F | Ht 70.0 in | Wt 234.2 lb

## 2016-09-05 DIAGNOSIS — Z510 Encounter for antineoplastic radiation therapy: Secondary | ICD-10-CM | POA: Diagnosis not present

## 2016-09-05 DIAGNOSIS — C61 Malignant neoplasm of prostate: Secondary | ICD-10-CM

## 2016-09-05 NOTE — Progress Notes (Signed)
  Radiation Oncology         [phone]   Name: Jeremiah Zhang MRN: [id number]   Date: 09/05/2016  DOB: 04-Oct-1946   Weekly Radiation Therapy Management    ICD-9-CM ICD-10-CM   1. Malignant neoplasm of prostate ([facility]) 185 C61     Current Dose: 28.8 Gy  Planned Dose:  45 Gy then seed implant boost  Narrative The patient presents for routine under treatment assessment.  Mr. Beissel presents for his 16th fraction of radiation to his Pelvis. He denies pain, dysuria, hematuria, frequency, or nocturia. He does have some mild fatigue and "feels dull" compared to his normal status. He reports softer stools than his normal which started within the last week.  Set-up films were reviewed. The chart was checked.  Physical Findings  height is 5\' 10"  (1.778 m) and weight is 234 lb 3.2 oz (106.2 kg). His temperature is 98.7 F (37.1 C). His blood pressure is 120/97 (abnormal) and his pulse is 81. His oxygen saturation is 96%. . Weight essentially stable.  No significant changes.  Impression The patient is tolerating radiation.  Plan Continue treatment as planned.         Sheral Apley [name], M.D.  This document serves as a record of services personally performed by Tyler Pita, MD. It was created on his behalf by Darcus Austin, a trained medical scribe. The creation of this record is based on the scribe's personal observations and the provider's statements to them. This document has been checked and approved by the attending provider.

## 2016-09-05 NOTE — Progress Notes (Signed)
Jeremiah Zhang presents for his 16th fraction of radiation to his Pelvis. He denies pain. He does have some mild fatigue and "feels dull" compared to his normal status. He reports softer stools than his normal which started within the last week. He denies any urinary problems including hematuria, burning, frequency. He denies nocturia.   BP (!) 120/97   Pulse 81   Temp 98.7 F (37.1 C)   Ht 5\' 10"  (1.778 m)   Wt 234 lb 3.2 oz (106.2 kg)   SpO2 96% Comment: room air  BMI 33.60 kg/m    Wt Readings from Last 3 Encounters:  09/05/16 234 lb 3.2 oz (106.2 kg)  09/02/16 235 lb 12.8 oz (107 kg)  08/27/16 238 lb 3.2 oz (108 kg)

## 2016-09-06 ENCOUNTER — Ambulatory Visit
Admission: RE | Admit: 2016-09-06 | Discharge: 2016-09-06 | Disposition: A | Discharge status: Home / Self Care | Payer: Medicare Other | Source: Ambulatory Visit | Attending: Radiation Oncology | Admitting: Radiation Oncology

## 2016-09-06 DIAGNOSIS — Z510 Encounter for antineoplastic radiation therapy: Secondary | ICD-10-CM | POA: Diagnosis not present

## 2016-09-07 ENCOUNTER — Ambulatory Visit
Admission: RE | Admit: 2016-09-07 | Discharge: 2016-09-07 | Disposition: A | Discharge status: Home / Self Care | Payer: Medicare Other | Source: Ambulatory Visit | Attending: Radiation Oncology | Admitting: Radiation Oncology

## 2016-09-07 DIAGNOSIS — Z510 Encounter for antineoplastic radiation therapy: Secondary | ICD-10-CM | POA: Diagnosis not present

## 2016-09-08 ENCOUNTER — Ambulatory Visit
Admission: RE | Admit: 2016-09-08 | Discharge: 2016-09-08 | Disposition: A | Discharge status: Home / Self Care | Payer: Medicare Other | Source: Ambulatory Visit | Attending: Radiation Oncology | Admitting: Radiation Oncology

## 2016-09-08 DIAGNOSIS — Z510 Encounter for antineoplastic radiation therapy: Secondary | ICD-10-CM | POA: Diagnosis not present

## 2016-09-09 ENCOUNTER — Encounter: Payer: Self-pay | Admitting: Radiation Oncology

## 2016-09-10 ENCOUNTER — Ambulatory Visit: Payer: Medicare Other

## 2016-09-12 ENCOUNTER — Encounter: Payer: Self-pay | Admitting: Radiation Oncology

## 2016-09-13 ENCOUNTER — Ambulatory Visit
Admission: RE | Admit: 2016-09-13 | Discharge: 2016-09-13 | Disposition: A | Discharge status: Home / Self Care | Payer: Medicare Other | Source: Ambulatory Visit | Attending: Radiation Oncology | Admitting: Radiation Oncology

## 2016-09-13 DIAGNOSIS — Z510 Encounter for antineoplastic radiation therapy: Secondary | ICD-10-CM | POA: Diagnosis not present

## 2016-09-14 ENCOUNTER — Ambulatory Visit
Admission: RE | Admit: 2016-09-14 | Discharge: 2016-09-14 | Disposition: A | Discharge status: Home / Self Care | Payer: Medicare Other | Source: Ambulatory Visit | Attending: Radiation Oncology | Admitting: Radiation Oncology

## 2016-09-14 DIAGNOSIS — Z510 Encounter for antineoplastic radiation therapy: Secondary | ICD-10-CM | POA: Diagnosis not present

## 2016-09-15 ENCOUNTER — Ambulatory Visit
Admission: RE | Admit: 2016-09-15 | Discharge: 2016-09-15 | Disposition: A | Discharge status: Home / Self Care | Payer: Medicare Other | Source: Ambulatory Visit | Attending: Radiation Oncology | Admitting: Radiation Oncology

## 2016-09-15 DIAGNOSIS — Z510 Encounter for antineoplastic radiation therapy: Secondary | ICD-10-CM | POA: Diagnosis not present

## 2016-09-16 ENCOUNTER — Encounter: Payer: Self-pay | Admitting: Radiation Oncology

## 2016-09-16 ENCOUNTER — Ambulatory Visit
Admission: RE | Admit: 2016-09-16 | Discharge: 2016-09-16 | Disposition: A | Discharge status: Home / Self Care | Payer: Medicare Other | Source: Ambulatory Visit | Attending: Radiation Oncology | Admitting: Radiation Oncology

## 2016-09-16 DIAGNOSIS — Z510 Encounter for antineoplastic radiation therapy: Secondary | ICD-10-CM | POA: Diagnosis not present

## 2016-09-17 ENCOUNTER — Encounter (HOSPITAL_BASED_OUTPATIENT_CLINIC_OR_DEPARTMENT_OTHER)
Admission: RE | Admit: 2016-09-17 | Discharge: 2016-09-17 | Disposition: A | Discharge status: Home / Self Care | Payer: Medicare Other | Source: Ambulatory Visit | Attending: Urology | Admitting: Urology

## 2016-09-17 ENCOUNTER — Ambulatory Visit
Admission: RE | Admit: 2016-09-17 | Discharge: 2016-09-17 | Disposition: A | Discharge status: Home / Self Care | Payer: Medicare Other | Source: Ambulatory Visit | Attending: Radiation Oncology | Admitting: Radiation Oncology

## 2016-09-17 ENCOUNTER — Ambulatory Visit (HOSPITAL_BASED_OUTPATIENT_CLINIC_OR_DEPARTMENT_OTHER)
Admission: RE | Admit: 2016-09-17 | Discharge: 2016-09-17 | Disposition: A | Discharge status: Home / Self Care | Payer: Medicare Other | Source: Ambulatory Visit | Attending: Urology | Admitting: Urology

## 2016-09-17 VITALS — BP 118/80 | HR 74 | Resp 18 | Wt 235.4 lb

## 2016-09-17 DIAGNOSIS — C61 Malignant neoplasm of prostate: Secondary | ICD-10-CM | POA: Insufficient documentation

## 2016-09-17 DIAGNOSIS — Z510 Encounter for antineoplastic radiation therapy: Secondary | ICD-10-CM | POA: Diagnosis not present

## 2016-09-17 DIAGNOSIS — Z0189 Encounter for other specified special examinations: Secondary | ICD-10-CM | POA: Insufficient documentation

## 2016-09-17 NOTE — Progress Notes (Signed)
  Radiation Oncology         [phone]   Name: Jeremiah Zhang MRN: [id number]   Date: 09/17/2016  DOB: 09/13/1946   Weekly Radiation Therapy Management    ICD-9-CM ICD-10-CM   1. Malignant neoplasm of prostate ([facility]) 185 C61     Current Dose: 43.2 Gy  Planned Dose:  45 Gy then seed implant boost  Narrative The patient presents for routine under treatment assessment.  Weight and vitals stable. Denies pain. Reports Tuesday/Wednesday of this week a new onset of urinary urgency, frequency, and hesitancy. Reports stream is weaker. Denies nocturia. Reports decreased stamina, energy, and motivation. Reports increased fatigue. Reports poor appetite and irritability. Reports forgetfulness. Denies his urine being cloudy, but reports occasional dysuria as a 2.5/10.  Set-up films were reviewed. The chart was checked.  Physical Findings  weight is 235 lb 6.4 oz (106.8 kg). His blood pressure is 118/80 and his pulse is 74. His respiration is 18 and oxygen saturation is 100%. . Weight essentially stable.  No significant changes.  Impression The patient is tolerating radiation. His new urinary symptoms sound standard as symptoms of radiation to the prostate.  Plan The patient is scheduled to complete external beam radiation on 09/20/16. His seed implant is scheduled on 10/23/15 and we will confirm this.         Sheral Apley [name], M.D.  This document serves as a record of services personally performed by Tyler Pita, MD. It was created on his behalf by Darcus Austin, a trained medical scribe. The creation of this record is based on the scribe's personal observations and the provider's statements to them. This document has been checked and approved by the attending provider.

## 2016-09-17 NOTE — Progress Notes (Signed)
Weight and vitals stable. Denies pain. Reports Tuesday/Wednesday of this week new onset of urinary urgency and frequency began. Reports new onset of urinary urgency. Reports stream is weaker. Reports new onset hesitancy. Denies nocturia. Reports decreased stamina, energy and motivation. Reports increased fatigue. Reports poor appetite and irritability. Reports forgetfulness.  BP 118/80 (BP Location: Left Arm, Patient Position: Sitting, Cuff Size: Large)   Pulse 74   Resp 18   Wt 235 lb 6.4 oz (106.8 kg)   SpO2 100%   BMI 33.78 kg/m  Wt Readings from Last 3 Encounters:  09/17/16 235 lb 6.4 oz (106.8 kg)  09/05/16 234 lb 3.2 oz (106.2 kg)  09/02/16 235 lb 12.8 oz (107 kg)

## 2016-09-20 ENCOUNTER — Ambulatory Visit
Admission: RE | Admit: 2016-09-20 | Discharge: 2016-09-20 | Disposition: A | Discharge status: Home / Self Care | Payer: Medicare Other | Source: Ambulatory Visit | Attending: Radiation Oncology | Admitting: Radiation Oncology

## 2016-09-20 ENCOUNTER — Encounter: Payer: Self-pay | Admitting: Radiation Oncology

## 2016-09-20 DIAGNOSIS — Z510 Encounter for antineoplastic radiation therapy: Secondary | ICD-10-CM | POA: Diagnosis not present

## 2016-09-21 ENCOUNTER — Other Ambulatory Visit: Payer: Self-pay | Admitting: Urology

## 2016-09-22 ENCOUNTER — Other Ambulatory Visit: Payer: Self-pay | Admitting: Urology

## 2016-09-22 NOTE — Progress Notes (Signed)
  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 The pelvis was treated to 45 Gy in 25 fractions at 1.8 Gy per fraction.  2. This will be followed by a seed implant boost of 110 Gy.  Beams/energy:    1. 3D // 15X 2. Radioactive seed implant // Iodine 125  Narrative: The patient tolerated radiation treatment relatively well. He did experience moderate fatigue and mild urinary irritation including, urgency, frequency, hesitancy, dysuria, and weak stream. He also notes decreased stamina/energy, poor appetite, irritability, and forgetfulness.  Plan: The patient has completed radiation treatment. The patient is scheduled for radioactive seed implant on 10/22/2016. The patient will return to radiation oncology clinic for routine followup in one month. I advised him to call or return sooner if he has any questions or concerns related to his recovery or treatment. ________________________________  Sheral Apley. [name], M.D.  This document serves as a record of services personally performed by Tyler Pita, MD. It was created on his behalf by Arlyce Harman, a trained medical scribe. The creation of this record is based on the scribe's personal observations and the provider's statements to them. This document has been checked and approved by the attending provider.

## 2016-09-23 MED ORDER — ACETAMINOPHEN 325 MG PO TABS: 1000.0000 mg | ORAL_TABLET | Freq: Four times a day (QID) | ORAL | Status: AC | PRN

## 2016-10-14 ENCOUNTER — Encounter (HOSPITAL_BASED_OUTPATIENT_CLINIC_OR_DEPARTMENT_OTHER): Payer: Self-pay | Admitting: *Deleted

## 2016-10-14 ENCOUNTER — Telehealth: Payer: Self-pay | Admitting: *Deleted

## 2016-10-14 NOTE — Telephone Encounter (Signed)
Called patient to remind of labs for implant, spoke with patient and he is aware of this appt. 

## 2016-10-15 ENCOUNTER — Encounter (HOSPITAL_BASED_OUTPATIENT_CLINIC_OR_DEPARTMENT_OTHER): Payer: Self-pay | Admitting: *Deleted

## 2016-10-15 DIAGNOSIS — G4730 Sleep apnea, unspecified: Secondary | ICD-10-CM | POA: Diagnosis not present

## 2016-10-15 DIAGNOSIS — C61 Malignant neoplasm of prostate: Secondary | ICD-10-CM | POA: Diagnosis not present

## 2016-10-15 DIAGNOSIS — Z7984 Long term (current) use of oral hypoglycemic drugs: Secondary | ICD-10-CM | POA: Diagnosis not present

## 2016-10-15 DIAGNOSIS — K219 Gastro-esophageal reflux disease without esophagitis: Secondary | ICD-10-CM | POA: Diagnosis not present

## 2016-10-15 DIAGNOSIS — E1142 Type 2 diabetes mellitus with diabetic polyneuropathy: Secondary | ICD-10-CM | POA: Diagnosis not present

## 2016-10-15 DIAGNOSIS — Z6832 Body mass index (BMI) 32.0-32.9, adult: Secondary | ICD-10-CM | POA: Diagnosis not present

## 2016-10-15 DIAGNOSIS — E669 Obesity, unspecified: Secondary | ICD-10-CM | POA: Diagnosis not present

## 2016-10-15 DIAGNOSIS — Z7901 Long term (current) use of anticoagulants: Secondary | ICD-10-CM | POA: Diagnosis not present

## 2016-10-15 DIAGNOSIS — Z96659 Presence of unspecified artificial knee joint: Secondary | ICD-10-CM | POA: Diagnosis not present

## 2016-10-15 DIAGNOSIS — N359 Urethral stricture, unspecified: Secondary | ICD-10-CM | POA: Diagnosis not present

## 2016-10-15 DIAGNOSIS — E291 Testicular hypofunction: Secondary | ICD-10-CM | POA: Diagnosis not present

## 2016-10-15 DIAGNOSIS — Z86718 Personal history of other venous thrombosis and embolism: Secondary | ICD-10-CM | POA: Diagnosis not present

## 2016-10-15 DIAGNOSIS — Z8673 Personal history of transient ischemic attack (TIA), and cerebral infarction without residual deficits: Secondary | ICD-10-CM | POA: Diagnosis not present

## 2016-10-15 DIAGNOSIS — I10 Essential (primary) hypertension: Secondary | ICD-10-CM | POA: Diagnosis not present

## 2016-10-15 DIAGNOSIS — Z79899 Other long term (current) drug therapy: Secondary | ICD-10-CM | POA: Diagnosis not present

## 2016-10-15 LAB — CBC
HCT: 41.7 % (ref 39.0–52.0)
HEMOGLOBIN: 14.9 g/dL (ref 13.0–17.0)
MCH: 32.4 pg (ref 26.0–34.0)
MCHC: 35.7 g/dL (ref 30.0–36.0)
MCV: 90.7 fL (ref 78.0–100.0)
Platelets: 195 10*3/uL (ref 150–400)
RBC: 4.6 MIL/uL (ref 4.22–5.81)
RDW: 13.6 % (ref 11.5–15.5)
WBC: 6.3 10*3/uL (ref 4.0–10.5)

## 2016-10-15 LAB — COMPREHENSIVE METABOLIC PANEL
ALBUMIN: 4.2 g/dL (ref 3.5–5.0)
ALK PHOS: 57 U/L (ref 38–126)
ALT: 37 U/L (ref 17–63)
AST: 32 U/L (ref 15–41)
Anion gap: 10 (ref 5–15)
BUN: 23 mg/dL — ABNORMAL HIGH (ref 6–20)
CALCIUM: 9 mg/dL (ref 8.9–10.3)
CHLORIDE: 103 mmol/L (ref 101–111)
CO2: 27 mmol/L (ref 22–32)
CREATININE: 1.2 mg/dL (ref 0.61–1.24)
GFR calc Af Amer: >60 mL/min (ref >60)
GFR calc non Af Amer: 59 mL/min — ABNORMAL LOW (ref >60)
GLUCOSE: 206 mg/dL — AB (ref 65–99)
Potassium: 3.7 mmol/L (ref 3.5–5.1)
SODIUM: 140 mmol/L (ref 135–145)
Total Bilirubin: 0.9 mg/dL (ref 0.3–1.2)
Total Protein: 6.9 g/dL (ref 6.5–8.1)

## 2016-10-15 LAB — PROTIME-INR
INR: 1.07
Prothrombin Time: 13.9 seconds (ref 11.4–15.2)

## 2016-10-15 LAB — APTT: APTT: 31 s (ref 24–36)

## 2016-10-15 NOTE — Progress Notes (Signed)
NPO AFTER MN.  ARRIVE AT 0800.  CURRENT LAB RESULTS, CXR, AND EKG IN CHART AND EPIC.  WILL DO FLEET ENEMA  AM DOS.

## 2016-10-21 ENCOUNTER — Encounter: Payer: Self-pay | Admitting: Radiation Oncology

## 2016-10-21 ENCOUNTER — Telehealth: Payer: Self-pay | Admitting: *Deleted

## 2016-10-21 DIAGNOSIS — Z510 Encounter for antineoplastic radiation therapy: Secondary | ICD-10-CM | POA: Diagnosis present

## 2016-10-21 DIAGNOSIS — C61 Malignant neoplasm of prostate: Secondary | ICD-10-CM | POA: Diagnosis present

## 2016-10-21 NOTE — Telephone Encounter (Signed)
CALLED PATIENT TO REMIND OF IMPLANT FOR 10-22-16, SPOKE WITH PATIENT AND HE IS AWARE OF THIS IMPLANT

## 2016-10-22 ENCOUNTER — Encounter (HOSPITAL_BASED_OUTPATIENT_CLINIC_OR_DEPARTMENT_OTHER): Payer: Self-pay | Admitting: Anesthesiology

## 2016-10-22 ENCOUNTER — Ambulatory Visit (HOSPITAL_COMMUNITY): Payer: Medicare Other

## 2016-10-22 ENCOUNTER — Ambulatory Visit (HOSPITAL_BASED_OUTPATIENT_CLINIC_OR_DEPARTMENT_OTHER): Payer: Medicare Other | Admitting: Anesthesiology

## 2016-10-22 ENCOUNTER — Encounter (HOSPITAL_BASED_OUTPATIENT_CLINIC_OR_DEPARTMENT_OTHER)
Admission: RE | Disposition: A | Discharge status: Home / Self Care | Payer: Self-pay | Source: Ambulatory Visit | Attending: Urology

## 2016-10-22 ENCOUNTER — Ambulatory Visit (HOSPITAL_BASED_OUTPATIENT_CLINIC_OR_DEPARTMENT_OTHER)
Admission: RE | Admit: 2016-10-22 | Discharge: 2016-10-22 | Disposition: A | Discharge status: Home / Self Care | Payer: Medicare Other | Source: Ambulatory Visit | Attending: Urology | Admitting: Urology

## 2016-10-22 ENCOUNTER — Encounter: Payer: Self-pay | Admitting: Radiation Oncology

## 2016-10-22 DIAGNOSIS — Z7984 Long term (current) use of oral hypoglycemic drugs: Secondary | ICD-10-CM | POA: Insufficient documentation

## 2016-10-22 DIAGNOSIS — E291 Testicular hypofunction: Secondary | ICD-10-CM | POA: Insufficient documentation

## 2016-10-22 DIAGNOSIS — Z7901 Long term (current) use of anticoagulants: Secondary | ICD-10-CM | POA: Insufficient documentation

## 2016-10-22 DIAGNOSIS — Z96659 Presence of unspecified artificial knee joint: Secondary | ICD-10-CM | POA: Insufficient documentation

## 2016-10-22 DIAGNOSIS — C61 Malignant neoplasm of prostate: Secondary | ICD-10-CM | POA: Diagnosis not present

## 2016-10-22 DIAGNOSIS — E1142 Type 2 diabetes mellitus with diabetic polyneuropathy: Secondary | ICD-10-CM | POA: Insufficient documentation

## 2016-10-22 DIAGNOSIS — Z6832 Body mass index (BMI) 32.0-32.9, adult: Secondary | ICD-10-CM | POA: Insufficient documentation

## 2016-10-22 DIAGNOSIS — N359 Urethral stricture, unspecified: Secondary | ICD-10-CM | POA: Diagnosis not present

## 2016-10-22 DIAGNOSIS — Z79899 Other long term (current) drug therapy: Secondary | ICD-10-CM | POA: Insufficient documentation

## 2016-10-22 DIAGNOSIS — Z8673 Personal history of transient ischemic attack (TIA), and cerebral infarction without residual deficits: Secondary | ICD-10-CM | POA: Insufficient documentation

## 2016-10-22 DIAGNOSIS — G4730 Sleep apnea, unspecified: Secondary | ICD-10-CM | POA: Insufficient documentation

## 2016-10-22 DIAGNOSIS — K219 Gastro-esophageal reflux disease without esophagitis: Secondary | ICD-10-CM | POA: Insufficient documentation

## 2016-10-22 DIAGNOSIS — I10 Essential (primary) hypertension: Secondary | ICD-10-CM | POA: Insufficient documentation

## 2016-10-22 DIAGNOSIS — Z86718 Personal history of other venous thrombosis and embolism: Secondary | ICD-10-CM | POA: Insufficient documentation

## 2016-10-22 DIAGNOSIS — E669 Obesity, unspecified: Secondary | ICD-10-CM | POA: Insufficient documentation

## 2016-10-22 HISTORY — DX: Polyneuropathy, unspecified: G62.9

## 2016-10-22 HISTORY — DX: Personal history of other venous thrombosis and embolism: Z86.718

## 2016-10-22 HISTORY — PX: RADIOACTIVE SEED IMPLANT: SHX5150

## 2016-10-22 HISTORY — DX: Long term (current) use of anticoagulants: Z79.01

## 2016-10-22 HISTORY — DX: Male erectile dysfunction, unspecified: N52.9

## 2016-10-22 HISTORY — DX: Personal history of transient ischemic attack (TIA), and cerebral infarction without residual deficits: Z86.73

## 2016-10-22 HISTORY — DX: Diaphragmatic hernia without obstruction or gangrene: K44.9

## 2016-10-22 HISTORY — DX: Testicular hypofunction: E29.1

## 2016-10-22 LAB — POCT I-STAT 4, (NA,K, GLUC, HGB,HCT)
GLUCOSE: 201 mg/dL — AB (ref 65–99)
HEMATOCRIT: 42 % (ref 39.0–52.0)
HEMOGLOBIN: 14.3 g/dL (ref 13.0–17.0)
POTASSIUM: 3.8 mmol/L (ref 3.5–5.1)
SODIUM: 143 mmol/L (ref 135–145)

## 2016-10-22 LAB — GLUCOSE, CAPILLARY: Glucose-Capillary: 193 mg/dL — ABNORMAL HIGH (ref 65–99)

## 2016-10-22 SURGERY — INSERTION, RADIATION SOURCE, PROSTATE
Anesthesia: General | Site: Prostate

## 2016-10-22 MED ORDER — LIDOCAINE 2% (20 MG/ML) 5 ML SYRINGE
INTRAMUSCULAR | Status: DC | PRN
  Administered 2016-10-22: 80 mg via INTRAVENOUS

## 2016-10-22 MED ORDER — IOHEXOL 300 MG/ML  SOLN
INTRAMUSCULAR | Status: DC | PRN
  Administered 2016-10-22: 10 mL

## 2016-10-22 MED ORDER — PROMETHAZINE HCL 25 MG/ML IJ SOLN
6.2500 mg | INTRAMUSCULAR | Status: DC | PRN
  Filled 2016-10-22: qty 1

## 2016-10-22 MED ORDER — LACTATED RINGERS IV SOLN
INTRAVENOUS | Status: DC
  Administered 2016-10-22 (×2): via INTRAVENOUS
  Filled 2016-10-22: qty 1000

## 2016-10-22 MED ORDER — CIPROFLOXACIN IN D5W 400 MG/200ML IV SOLN
INTRAVENOUS | Status: AC
  Filled 2016-10-22: qty 200

## 2016-10-22 MED ORDER — MIDAZOLAM HCL 5 MG/5ML IJ SOLN
INTRAMUSCULAR | Status: DC | PRN
  Administered 2016-10-22: 2 mg via INTRAVENOUS

## 2016-10-22 MED ORDER — PROPOFOL 10 MG/ML IV BOLUS
INTRAVENOUS | Status: DC | PRN
  Administered 2016-10-22: 200 mg via INTRAVENOUS

## 2016-10-22 MED ORDER — CIPROFLOXACIN IN D5W 400 MG/200ML IV SOLN
400.0000 mg | INTRAVENOUS | Status: DC
  Filled 2016-10-22: qty 200

## 2016-10-22 MED ORDER — EPHEDRINE 5 MG/ML INJ
INTRAVENOUS | Status: AC
  Filled 2016-10-22: qty 10

## 2016-10-22 MED ORDER — FENTANYL CITRATE (PF) 100 MCG/2ML IJ SOLN
INTRAMUSCULAR | Status: DC | PRN
  Administered 2016-10-22 (×2): 50 ug via INTRAVENOUS

## 2016-10-22 MED ORDER — OXYCODONE-ACETAMINOPHEN 5-325 MG PO TABS: 1.0000 | ORAL_TABLET | Freq: Four times a day (QID) | ORAL | 0 refills | Status: DC | PRN

## 2016-10-22 MED ORDER — DEXAMETHASONE SODIUM PHOSPHATE 10 MG/ML IJ SOLN
INTRAMUSCULAR | Status: AC
  Filled 2016-10-22: qty 1

## 2016-10-22 MED ORDER — MEPERIDINE HCL 25 MG/ML IJ SOLN
6.2500 mg | INTRAMUSCULAR | Status: DC | PRN
  Filled 2016-10-22: qty 1

## 2016-10-22 MED ORDER — ONDANSETRON HCL 4 MG/2ML IJ SOLN
INTRAMUSCULAR | Status: DC | PRN
  Administered 2016-10-22: 4 mg via INTRAVENOUS

## 2016-10-22 MED ORDER — MIDAZOLAM HCL 2 MG/2ML IJ SOLN
INTRAMUSCULAR | Status: AC
  Filled 2016-10-22: qty 2

## 2016-10-22 MED ORDER — PHENYLEPHRINE 40 MCG/ML (10ML) SYRINGE FOR IV PUSH (FOR BLOOD PRESSURE SUPPORT)
PREFILLED_SYRINGE | INTRAVENOUS | Status: AC
  Filled 2016-10-22: qty 10

## 2016-10-22 MED ORDER — SUCCINYLCHOLINE CHLORIDE 200 MG/10ML IV SOSY
PREFILLED_SYRINGE | INTRAVENOUS | Status: AC
  Filled 2016-10-22: qty 10

## 2016-10-22 MED ORDER — CIPROFLOXACIN IN D5W 400 MG/200ML IV SOLN
400.0000 mg | Freq: Two times a day (BID) | INTRAVENOUS | Status: DC
  Administered 2016-10-22: 400 mg via INTRAVENOUS
  Filled 2016-10-22: qty 200

## 2016-10-22 MED ORDER — HYDROMORPHONE HCL 1 MG/ML IJ SOLN
0.2500 mg | INTRAMUSCULAR | Status: DC | PRN
  Filled 2016-10-22: qty 0.5

## 2016-10-22 MED ORDER — FENTANYL CITRATE (PF) 100 MCG/2ML IJ SOLN
INTRAMUSCULAR | Status: AC
  Filled 2016-10-22: qty 2

## 2016-10-22 MED ORDER — FLEET ENEMA 7-19 GM/118ML RE ENEM
1.0000 | ENEMA | Freq: Once | RECTAL | Status: DC
  Filled 2016-10-22: qty 1

## 2016-10-22 MED ORDER — SODIUM CHLORIDE 0.9 % IV SOLN
INTRAVENOUS | Status: DC | PRN
  Administered 2016-10-22: 3000 mL

## 2016-10-22 MED ORDER — EPHEDRINE SULFATE-NACL 50-0.9 MG/10ML-% IV SOSY
PREFILLED_SYRINGE | INTRAVENOUS | Status: DC | PRN
  Administered 2016-10-22: 5 mg via INTRAVENOUS
  Administered 2016-10-22 (×3): 10 mg via INTRAVENOUS

## 2016-10-22 MED ORDER — ONDANSETRON HCL 4 MG/2ML IJ SOLN
INTRAMUSCULAR | Status: AC
  Filled 2016-10-22: qty 2

## 2016-10-22 MED ORDER — TRIMETHOPRIM 100 MG PO TABS: 100.0000 mg | ORAL_TABLET | ORAL | 1 refills | Status: DC

## 2016-10-22 MED ORDER — PROPOFOL 10 MG/ML IV BOLUS
INTRAVENOUS | Status: AC
  Filled 2016-10-22: qty 20

## 2016-10-22 MED ORDER — HYOSCYAMINE SULFATE SL 0.125 MG SL SUBL: 0.1250 mg | SUBLINGUAL_TABLET | Freq: Two times a day (BID) | SUBLINGUAL | 1 refills | Status: DC

## 2016-10-22 MED ORDER — KETOROLAC TROMETHAMINE 30 MG/ML IJ SOLN
INTRAMUSCULAR | Status: DC | PRN
  Administered 2016-10-22: 30 mg via INTRAVENOUS

## 2016-10-22 MED ORDER — KETOROLAC TROMETHAMINE 30 MG/ML IJ SOLN
INTRAMUSCULAR | Status: AC
  Filled 2016-10-22: qty 1

## 2016-10-22 MED ORDER — SUCCINYLCHOLINE CHLORIDE 200 MG/10ML IV SOSY
PREFILLED_SYRINGE | INTRAVENOUS | Status: AC
  Filled 2016-10-22: qty 20

## 2016-10-22 MED ORDER — LIDOCAINE 2% (20 MG/ML) 5 ML SYRINGE
INTRAMUSCULAR | Status: AC
  Filled 2016-10-22: qty 5

## 2016-10-22 SURGICAL SUPPLY — 30 items
BAG URINE DRAINAGE (UROLOGICAL SUPPLIES) ×3 IMPLANT
BLADE CLIPPER SURG (BLADE) ×3 IMPLANT
CATH FOLEY 2WAY SLVR  5CC 16FR (CATHETERS) ×4
CATH FOLEY 2WAY SLVR 5CC 16FR (CATHETERS) ×2 IMPLANT
CATH ROBINSON RED A/P 20FR (CATHETERS) ×3 IMPLANT
CLOTH BEACON ORANGE TIMEOUT ST (SAFETY) ×3 IMPLANT
COVER BACK TABLE 60X90IN (DRAPES) ×3 IMPLANT
COVER MAYO STAND STRL (DRAPES) ×3 IMPLANT
DRSG TEGADERM 4X4.75 (GAUZE/BANDAGES/DRESSINGS) ×3 IMPLANT
DRSG TEGADERM 8X12 (GAUZE/BANDAGES/DRESSINGS) ×3 IMPLANT
GLOVE BIO SURGEON STRL SZ7.5 (GLOVE) ×6 IMPLANT
GLOVE ECLIPSE 8.0 STRL XLNG CF (GLOVE) IMPLANT
GOWN STRL REUS W/ TWL LRG LVL3 (GOWN DISPOSABLE) ×1 IMPLANT
GOWN STRL REUS W/ TWL XL LVL3 (GOWN DISPOSABLE) ×1 IMPLANT
GOWN STRL REUS W/TWL LRG LVL3 (GOWN DISPOSABLE) ×3
GOWN STRL REUS W/TWL XL LVL3 (GOWN DISPOSABLE) ×3
GUIDEWIRE STR DUAL SENSOR (WIRE) ×2 IMPLANT
HOLDER FOLEY CATH W/STRAP (MISCELLANEOUS) ×3 IMPLANT
IV NS 1000ML (IV SOLUTION) ×3
IV NS 1000ML BAXH (IV SOLUTION) ×1 IMPLANT
KIT ROOM TURNOVER WOR (KITS) ×3 IMPLANT
MANIFOLD NEPTUNE II (INSTRUMENTS) IMPLANT
PACK CYSTO (CUSTOM PROCEDURE TRAY) ×3 IMPLANT
SPONGE GAUZE 4X4 12PLY STER LF (GAUZE/BANDAGES/DRESSINGS) ×2 IMPLANT
SYRINGE 10CC LL (SYRINGE) ×3 IMPLANT
TUBE CONNECTING 12'X1/4 (SUCTIONS)
TUBE CONNECTING 12X1/4 (SUCTIONS) IMPLANT
UNDERPAD 30X30 INCONTINENT (UNDERPADS AND DIAPERS) ×6 IMPLANT
WATER STERILE IRR 500ML POUR (IV SOLUTION) ×3 IMPLANT
nucletron ×2 IMPLANT

## 2016-10-22 NOTE — Progress Notes (Signed)
  Radiation Oncology         [phone] ________________________________  Name: Jeremiah Zhang MRN: [id number]  Date: 10/22/2016  DOB: October 05, 1946       Prostate Seed Implant  [id number], Caryl Bis, MD  No ref. 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 Malignant neoplasm of prostate ([place]) 185 C61 Discontinue IV     Continue foley catheter  2. Prostate cancer ([place]) 185 C61 DG Chest 2 View     DG Chest 2 View     Discontinue IV     Continue foley catheter    PROCEDURE: Insertion of radioactive I-125 seeds into the prostate gland.  RADIATION DOSE: 110 Gy, boost therapy.  TECHNIQUE: Jeremiah Zhang was brought to the operating room with the urologist. He was placed in the dorsolithotomy position. He was catheterized and a rectal tube was inserted. The perineum was shaved, prepped and draped. The ultrasound probe was then introduced into the rectum to see the prostate gland.  TREATMENT DEVICE: A needle grid was attached to the ultrasound probe stand and anchor needles were placed.  3D PLANNING: The prostate was imaged in 3D using a sagittal sweep of the prostate probe. These images were transferred to the planning computer. There, the prostate, urethra and rectum were defined on each axial reconstructed image. Then, the software created an optimized 3D plan and a few seed positions were adjusted. The quality of the plan was reviewed using [facility] information for the target and the following two organs at risk:  Urethra and Rectum.  Then the accepted plan was uploaded to the seed Selectron afterloading unit.  PROSTATE VOLUME STUDY:  Using transrectal ultrasound the volume of the prostate was verified to be 49.2 cc.  SPECIAL TREATMENT PROCEDURE/SUPERVISION AND HANDLING: The Nucletron FIRST system was used to place the needles under sagittal guidance. A  total of 21 needles were used to deposit 65 seeds in the prostate gland. The individual seed activity was 0.422 mCi.  COMPLEX SIMULATION: At the end of the procedure, an anterior radiograph of the pelvis was obtained to document seed positioning and count. Cystoscopy was performed to check the urethra and bladder.  MICRODOSIMETRY: At the end of the procedure, the patient was emitting 0.04 mR/hr at 1 meter. Accordingly, he was considered safe for hospital discharge.  PLAN: The patient will return to the radiation oncology clinic for post implant CT dosimetry in three weeks.   ________________________________  Sheral Apley [name], M.D.

## 2016-10-22 NOTE — Anesthesia Procedure Notes (Signed)
Procedure Name: LMA Insertion Date/Time: 10/22/2016 9:56 AM Performed by: Wanita Chamberlain Pre-anesthesia Checklist: Patient identified, Timeout performed, Emergency Drugs available, Suction available and Patient being monitored Patient Re-evaluated:Patient Re-evaluated prior to inductionOxygen Delivery Method: Circle system utilized Preoxygenation: Pre-oxygenation with 100% oxygen Intubation Type: IV induction Ventilation: Mask ventilation without difficulty LMA: LMA inserted LMA Size: 4.0 Number of attempts: 1 Airway Equipment and Method: Bite block (gauze bite block) Placement Confirmation: breath sounds checked- equal and bilateral and positive ETCO2 Tube secured with: Tape Dental Injury: Teeth and Oropharynx as per pre-operative assessment

## 2016-10-22 NOTE — Transfer of Care (Signed)
Immediate Anesthesia Transfer of Care Note  Patient: Jeremiah Zhang  Procedure(s) Performed: Procedure(s): RADIOACTIVE SEED IMPLANT/BRACHYTHERAPY IMPLANT, URETHRAL STRICTURE DIALIATION (N/A)  Patient Location: PACU  Anesthesia Type:General  Level of Consciousness: awake, alert , oriented and patient cooperative  Airway & Oxygen Therapy: Patient Spontanous Breathing and Patient connected to nasal cannula oxygen  Post-op Assessment: Report given to RN and Post -op Vital signs reviewed and stable  Post vital signs: Reviewed and stable  Last Vitals:  Vitals:   10/22/16 0808  BP: 139/90  Pulse: 72  Resp: 18  Temp: 37.1 C    Last Pain:  Vitals:   10/22/16 0808  TempSrc: Oral      Patients Stated Pain Goal: 4 ([id number])  Complications: No apparent anesthesia complications

## 2016-10-22 NOTE — Interval H&P Note (Signed)
History and Physical Interval Note:  10/22/2016 8:17 AM  Jeremiah Zhang  has presented today for surgery, with the diagnosis of prostate cancer  The various methods of treatment have been discussed with the patient and family. After consideration of risks, benefits and other options for treatment, the patient has consented to  Procedure(s): RADIOACTIVE SEED IMPLANT/BRACHYTHERAPY IMPLANT (N/A) as a surgical intervention .  The patient's history has been reviewed, patient examined, no change in status, stable for surgery.  I have reviewed the patient's chart and labs.  Questions were answered to the patient's satisfaction.     Amrie Gurganus I Ashleen Demma

## 2016-10-22 NOTE — Anesthesia Preprocedure Evaluation (Addendum)
Anesthesia Evaluation  Patient identified by MRN, date of birth, ID band Patient awake    Reviewed: Allergy & Precautions, NPO status , Patient's Chart, lab work & pertinent test results, reviewed documented beta blocker date and time   Airway Mallampati: II  TM Distance: >3 FB Neck ROM: Full    Dental no notable dental hx. (+) Caps, Teeth Intact, Dental Advisory Given, Chipped,    Pulmonary sleep apnea ,  Non compliant with CPAP   Pulmonary exam normal breath sounds clear to auscultation       Cardiovascular hypertension, Pt. on medications and Pt. on home beta blockers + DVT  Normal cardiovascular exam Rhythm:Regular Rate:Normal  Hx/o DVT post op knee arthroplasty    Neuro/Psych Peripheral neuropathy TIA Neuromuscular disease negative psych ROS   GI/Hepatic Neg liver ROS, hiatal hernia, GERD  Medicated and Controlled,  Endo/Other  diabetes, Poorly Controlled, Type 2, Oral Hypoglycemic AgentsObesity Hyperlipidemia  Renal/GU negative Renal ROS   Prostate Ca    Musculoskeletal  (+) Arthritis , Osteoarthritis,    Abdominal (+) + obese,   Peds  Hematology Eliquis for hx/o DVT   Anesthesia Other Findings   Reproductive/Obstetrics                              Chemistry      Component Value Date/Time   NA 140 10/15/2016 0902   NA 142 08/27/2014 1114   K 3.7 10/15/2016 0902   K 3.6 08/27/2014 1114   CL 103 10/15/2016 0902   CL 102 08/27/2014 1114   CO2 27 10/15/2016 0902   CO2 30 08/27/2014 1114   BUN 23 (H) 10/15/2016 0902   BUN 29 (H) 08/27/2014 1114   CREATININE 1.20 10/15/2016 0902   CREATININE 1.49 (H) 08/27/2014 1114      Component Value Date/Time   CALCIUM 9.0 10/15/2016 0902   CALCIUM 9.1 08/27/2014 1114   ALKPHOS 57 10/15/2016 0902   ALKPHOS 70 04/11/2012 1037   AST 32 10/15/2016 0902   AST 23 04/11/2012 1037   ALT 37 10/15/2016 0902   ALT 34 04/11/2012 1037   BILITOT  0.9 10/15/2016 0902   BILITOT 0.7 04/11/2012 1037     Lab Results  Component Value Date   WBC 6.3 10/15/2016   HGB 14.9 10/15/2016   HCT 41.7 10/15/2016   MCV 90.7 10/15/2016   PLT 195 10/15/2016   EKG: normal sinus rhythm, inferior infarct.  Anesthesia Physical Anesthesia Plan  ASA: III  Anesthesia Plan: General   Post-op Pain Management:    Induction: Intravenous  Airway Management Planned: LMA  Additional Equipment:   Intra-op Plan:   Post-operative Plan: Extubation in OR  Informed Consent: I have reviewed the patients History and Physical, chart, labs and discussed the procedure including the risks, benefits and alternatives for the proposed anesthesia with the patient or authorized representative who has indicated his/her understanding and acceptance.   Dental advisory given  Plan Discussed with: Anesthesiologist, CRNA and Surgeon  Anesthesia Plan Comments:         Anesthesia Quick Evaluation

## 2016-10-22 NOTE — H&P (Signed)
Office Visit Report     07/06/2016   --------------------------------------------------------------------------------   Jeremiah Zhang  MRN: [id number]  PRIMARY CARE:  Dimas Aguas, MD  DOB: 1946/04/19, 71 year old Male  REFERRING:  Quintavia Rogstad I. [name], MD  SSN: [id number]  PROVIDER:  Carolan Clines, M.D.    LOCATION:  [facility] [phone]   --------------------------------------------------------------------------------   CC: I have prostate cancer.  HPI: Jeremiah Zhang is a 71 year-old male patient who was referred by Dr. Pierre Bali I. [name], MD who is here evaluation for treatment of prostate cancer.  His prostate cancer was diagnosed 03/25/2015. He does have the pathology report from his biopsy. His cancer was diagnosed by Dr. Louis Meckel. His PSA at his time of diagnosis was 4.14. His most recent PSA is 2.36.   He has undergone surgery for treatment. He has not undergone External Beam Radiation Therapy for treatment. He has not undergone Hormonal Therapy for treatment.   He does not have urinary incontinence. He does have problems with erectile dysfunction. He has not recently had unwanted weight loss. He is not having pain in new locations.   He is s/p Lt focal cryotherapy on 06/02/15. He is s/p repeat Pbx 06/07/16. F/u consult to review pathology, CT & Bone scan results.     ALLERGIES: Crestor TABS Dynapen Vytorin TABS    MEDICATIONS: Bisoprolol-Hydrochlorothiazide 10-6.25 MG Oral Tablet Oral  Daily Multivitamin TABS Oral  Eliquis 5 MG Oral Tablet Oral  EpiPen 2-Pak 0.3 MG/0.3ML DEVI Injection  Furosemide 40 MG Oral Tablet Oral  Glimepiride 2 MG Oral Tablet 0 Oral  MetFORMIN HCl - 1000 MG Oral Tablet Oral  NexIUM 40 MG Oral Capsule Delayed Release Oral  Quinapril HCl TABS 0 Oral  Vitamin B Complex TABS 0 Oral  Vitamin C TABS Oral  Vitamin D3 CAPS 0 Oral     GU PSH: Locm 300-399Mg /Ml Iodine,1Ml - 06/25/2016 Prostate Needle Biopsy - 06/07/2016       PSH Notes: Rotator Cuff Repair, Knee Surgery Right, Knee Surgery Left   NON-GU PSH: Elbow Arthroscopy - 08/21/2014 Surgical Pathology, Gross And Microscopic Examination For Prostate Needle - 06/07/2016    GU PMH: Elevated PSA, Elevated prostate specific antigen (PSA) - 12/09/2015 Other male ED, Mixed erectile dysfunction - 12/09/2015 Prostate Cancer, Prostate cancer - 12/09/2015 Testicular hypofunction, Hypogonadism, 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    NON-GU PMH: Decreased libido, Decreased libido - 12/09/2015 Abnormal results of function studies of other organs and systems, Abnormal bone scan of lumbar spine - 09/29/2015 Encounter for general adult medical examination without abnormal findings, Encounter for preventive health examination - 09/10/2015 Acute embolism and thrombosis of unspecified deep veins of unspecified lower extremity, DVT of leg (deep venous thrombosis) - 08/21/2014 Personal history of other diseases of the circulatory system, History of hypertension - 08/21/2014 Personal history of other diseases of the musculoskeletal system and connective tissue, History of arthritis - 08/21/2014 Personal history of other diseases of the nervous system and sense organs, History of sleep apnea - 08/21/2014 Personal history of other endocrine, nutritional and metabolic disease, History of hypercholesterolemia - 08/21/2014, History of diabetes mellitus, - 08/21/2014 Personal history of other specified conditions, History of heartburn - 08/21/2014    FAMILY HISTORY: No pertinent family history - Runs In Family   SOCIAL HISTORY: Marital Status: Married Current Smoking Status: Patient has never smoked.  Has never drank.  Patient's occupation is/was Retired.     Notes: 2 sons   Wife has had multiple left hip surgeries  REVIEW OF SYSTEMS:    GU Review Male:   Patient reports leakage of urine. Patient denies frequent urination, hard to postpone urination, burning/ pain with urination, get up at night  to urinate, stream starts and stops, trouble starting your stream, have to strain to urinate , erection problems, and penile pain.  Gastrointestinal (Upper):   Patient reports indigestion/ heartburn. Patient denies nausea and vomiting.  Gastrointestinal (Lower):   Patient reports constipation. Patient denies diarrhea.  Constitutional:   Patient denies fever, night sweats, weight loss, and fatigue.  Skin:   Patient denies skin rash/ lesion and itching.  Eyes:   Patient denies blurred vision and double vision.  Ears/ Nose/ Throat:   Patient denies sore throat and sinus problems.  Hematologic/Lymphatic:   Patient denies swollen glands and easy bruising.  Cardiovascular:   Patient denies leg swelling and chest pains.  Respiratory:   Patient denies cough and shortness of breath.  Endocrine:   Patient denies excessive thirst.  Musculoskeletal:   Patient denies back pain and joint pain.  Neurological:   Patient denies headaches and dizziness.  Psychologic:   Patient denies depression and anxiety.   Notes: peripheral neuropathy   VITAL SIGNS:      07/06/2016 04:06 PM  BP 148/92 mmHg  Pulse 67 /min  Temperature 98.5 F / 37 C   GU PHYSICAL EXAMINATION:    Anus and Perineum: No hemorrhoids. No anal stenosis. No rectal fissure, no anal fissure. No edema, no dimple, no perineal tenderness, no anal tenderness.  Scrotum: No lesions. No edema. No cysts. No warts.  Epididymides: Right: no spermatocele, no masses, no cysts, no tenderness, no induration, no enlargement. Left: no spermatocele, no masses, no cysts, no tenderness, no induration, no enlargement.  Testes: No tenderness, no swelling, no enlargement left testes. No tenderness, no swelling, no enlargement right testes. Normal location left testes. Normal location right testes. No mass, no cyst, no varicocele, no hydrocele left testes. No mass, no cyst, no varicocele, no hydrocele right testes.  Urethral Meatus: Normal size. No lesion, no wart, no  discharge, no polyp. Normal location.  Penis: Circumcised, no warts, no cracks. No dorsal Peyronie's plaques, no left corporal Peyronie's plaques, no right corporal Peyronie's plaques, no scarring, no warts. No balanitis, no meatal stenosis.  Prostate: 40 gram or 2+ size. Left lobe normal consistency, right lobe normal consistency. Symmetrical lobes. No prostate nodule. Left lobe no tenderness, right lobe no tenderness.  Seminal Vesicles: Nonpalpable.  Sphincter Tone: Normal sphincter. No rectal tenderness. No rectal mass.    MULTI-SYSTEM PHYSICAL EXAMINATION:    Constitutional: Well-nourished. No physical deformities. Normally developed. Good grooming.  Neck: Neck symmetrical, not swollen. Normal tracheal position.  Respiratory: No labored breathing, no use of accessory muscles.   Cardiovascular: Normal temperature, normal extremity pulses, no swelling, no varicosities.  Lymphatic: No enlargement of neck, axillae, groin.  Skin: No paleness, no jaundice, no cyanosis. No lesion, no ulcer, no rash.  Neurologic / Psychiatric: Oriented to time, oriented to place, oriented to person. No depression, no anxiety, no agitation.  Gastrointestinal: No mass, no tenderness, no rigidity, non obese abdomen.  Eyes: Normal conjunctivae. Normal eyelids.  Ears, Nose, Mouth, and Throat: Left ear no scars, no lesions, no masses. Right ear no scars, no lesions, no masses. Nose no scars, no lesions, no masses. Normal hearing. Normal lips.  Musculoskeletal: Normal gait and station of head and neck.     PAST DATA REVIEWED:  Source Of History:  Patient  Lab Test  Review:   PSA, Path Report  Records Review:   Pathology Reports, Previous Patient Records  X-Ray Review: C.T. Abdomen/Pelvis: Reviewed Films. Reviewed Report. Discussed With Patient.  Bone Scan: Reviewed Films. Reviewed Report. Discussed With Patient.     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 The patient is status post left focal cryotherapy in August 2016 for T1c prostate cancer. He was found to have an elevated PSA following testosterone replacement therapy (which was stopped). Biopsy showed a single focus of Gleason 4+3 carcinoma prostate in 5% of the left base medial biopsies. Post op, PSA drop initially, but then began to rise to a level of 3.08. Rectal examination remains smooth and flat. He now returns repeat prostate ultrasound and biopsy his CT and bone scan for follow-up.   Prostate biopsy, with PSA of 3.48 in May 2017, now shows 5 areas of adenocarcinoma prostate. 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   The overall Gleason score for this case is based on  the core with the highest Gleason score the bone scan showed lumbar arthritis only. CT is negative.   We have discussed his biopsy, PSA, and his history. He now appears to have multifocal prostate cancer, and we must move on from the concept of focal therapy to therapy of the total prostate. He will need to have repeat cryotherapy, or, alternatively, have radiation therapy, such as IMRT. He will have a second opinion with Dr. Tyler Pita regarding radiation therapy options. Note he is a diabetic, with moderate control. In addition, the patient has a history of a TIA, and currently  is on anticoagulant therapy. (40 minutes).    PLAN:           Document Letter(s):  Created for Patient: Clinical Summary          Drawings Troiano:        Notes:   Referral  to Dr. [name] for hx of recurrent PCa, s/p focal cryotherapy. Hx of diabetes & on Eliquis.for hx of TIA.          The information contained in this medical record document is considered private and confidential patient information. This information can only be used for the medical diagnosis and/or medical services that are being provided by the patient's selected caregivers. This information can only be distributed outside of the patient's care if the patient agrees and signs waivers of authorization for this information to be sent to an outside source or route.

## 2016-10-22 NOTE — Anesthesia Postprocedure Evaluation (Signed)
Anesthesia Post Note  Patient: Tonnie R Biondolillo  Procedure(s) Performed: Procedure(s) (LRB): RADIOACTIVE SEED IMPLANT/BRACHYTHERAPY IMPLANT, URETHRAL STRICTURE DIALIATION (N/A)  Patient location during evaluation: PACU Anesthesia Type: General Level of consciousness: awake and alert and oriented Pain management: pain level controlled Vital Signs Assessment: post-procedure vital signs reviewed and stable Respiratory status: spontaneous breathing, nonlabored ventilation and respiratory function stable Cardiovascular status: blood pressure returned to baseline and stable Postop Assessment: no signs of nausea or vomiting Anesthetic complications: no       Last Vitals:  Vitals:   10/22/16 1230 10/22/16 1245  BP: (!) 144/92 125/70  Pulse: 72 69  Resp: 17 13  Temp:      Last Pain:  Vitals:   10/22/16 0808  TempSrc: Oral                 Merie Wulf A.

## 2016-10-22 NOTE — Op Note (Signed)
Pre-operative diagnosis :   Recurrent multi-focal prostate cancer  Postoperative diagnosis:  Same  Operation:  Cysto, dilation of urethral stricture, I-`125 seed therapy for PCa. ( 21 needles/65 seeds)  Surgeon:  S. [name], MD  Radiation therapist: Dr. Tyler Pita  Findings: Peno-scrotal junction stricture, requiring cystoscopic dilation and catheterization.  47cc prostate  By intra-operative u/s.   Anesthesia: Gereral LMA  Preparation: After appropriate preanesthesia, the patient was brought the operative room, placed on the operating table in dorsal supine position where general LMA anesthesia was introduced. He was then replaced in the dorsal lithotomy position with the pubis was prepped with Betadine solution and draped in usual fashion. The history was reviewed and the arm band was double checked.  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 The patient is status post left focal cryotherapy in August 2016 for T1c prostate cancer. He was found to have an elevated PSA following testosterone replacement therapy (which was stopped). Biopsy showed a single focus of Gleason 4+3 carcinoma prostate in 5% of the left base medial biopsies. Post op, PSA drop initially, but then began to rise to a level of 3.08. Rectal examination remains smooth and flat. He now returns repeat prostate ultrasound and biopsy his CT and bone scan for follow-up.   Prostate biopsy, with PSA of 3.48 in May 2017, now shows 5 areas of adenocarcinoma prostate. 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   The overall Gleason score for this case is based on the core with the highest Gleason score the bone scan showed lumbar arthritis only. CT is negative.   We have discussed his biopsy, PSA, and his history. He now appears to have multifocal prostate cancer, and we must move on from the concept of focal therapy to therapy of the total prostate. He will need to have repeat cryotherapy, or, alternatively, have radiation therapy, such as IMRT. He will have a second opinion with Dr. Tyler Pita regarding radiation therapy options. Note he is a diabetic, with moderate control. In addition, the patient has a history of a TIA, and currently  is on anticoagulant therapy.   Statement of  Likelihood of Success: Excellent. TIME-OUT observed.:  Procedure: The Foley catheter was attempted to be placed, but could not be placed. 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 And 0.038 guidewire was  passed through the strictured area, and the flexible cystoscope was passed through the area, with dilation of the stricture. Following this, a 28 French Councill catheter was passed over the 038 guidewire.  The patient then proceeded to have I-125 seed implantation, with 21 needles, and 65 seeds placed. The patient briefly had external beam radiation therapy. In addition, the patient had previous  had focal cryotherapy. The prostate was somewhat firm, yielding some resistance to needle placement of the seeds.  The patient had 8 prescribed dose of 110 Gy delivered. At the end of the procedure, cystoscopy was accomplished, which showed no bleeding, and no seeds within the prostate fossa, or within the bladder. Fluoroscopy was used to determine that the seeds were in excellent position, and no seeds were identified outside the prostate, with AP and lateral views.  Cystoscopy was again accomplished, and again, a 16 French Councill catheter was used to pass over a guidewire, into the bladder without difficulty. The catheter will be left over the weekend. The patient was awakened, and taken recovery room in good condition.

## 2016-10-22 NOTE — Discharge Instructions (Addendum)
Prostate Cancer The prostate is a walnut-sized gland that is involved in the production of semen. It is located below a man's bladder, in front of the rectum. Prostate cancer is the abnormal growth of cells in the prostate gland. What are the causes? The exact cause of this condition is not known. What increases the risk? This condition is more likely to develop in men who:  Are older than age 73.  Are African-American.  Are obese.  Have a family history of prostate cancer.  Have a family history of breast cancer. What are the signs or symptoms? Symptoms of this condition include:  A need to urinate often.  Weak or interrupted flow of urine.  Trouble starting or stopping urination.  Inability to urinate.  Pain or burning during urination.  Painful ejaculation.  Blood in urine or semen.  Persistent pain or discomfort in the lower back, lower abdomen, hips, or upper thighs.  Trouble getting an erection.  Trouble emptying the bladder all the way. How is this diagnosed? This condition can be diagnosed with:  A digital rectal exam. For this exam, a health care provider inserts a gloved finger into the rectum to feel the prostate gland.  A blood test called a prostate-specific antigen (PSA) test.  An imaging test called transrectal ultrasonography.  A procedure in which a sample of tissue is taken from the prostate and examined under a microscope (prostate biopsy). Once the condition is diagnosed, tests will be done to determine how far the cancer has spread. This is called staging the cancer. Staging may involve imaging tests, such as:  A bone scan.  A CT scan.  A PET scan.  An MRI. The stages of prostate cancer are as follows:  Stage I. At this stage, the cancer is found in the prostate only. The cancer is not visible on imaging tests and it is usually found by accident, such as during a prostate surgery.  Stage II. At this stage, the cancer is more advanced  than it is in stage I, but the cancer has not spread outside the prostate.  Stage III. At this stage, the cancer has spread beyond the outer layer of the prostate to nearby tissues. The cancer may be found in the seminal vesicles, which are near the bladder and the prostate.  Stage IV. At this stage, the cancer has spread other parts of the body, such as the lymph nodes, bones, bladder, rectum, liver, or lungs. How is this treated? Treatment for this condition depends on several factors, including the stage of the cancer, your age, personal preferences, and your overall health. Talk with your health care provider about treatment options that are recommended for you. Common treatments include:  Observation for early stage prostate cancer (active surveillance). This involves having exams, blood tests, and in some cases, more biopsies. For some men, this is the only treatment needed.  Surgery. Types of surgeries include:  Open surgery. In this surgery, a larger incision is made to remove the prostate.  A laparoscopic prostatectomy. This is a surgery to remove the prostate and lymph nodes through several, small incisions. It is often referred to as a minimally invasive surgery.  A robotic prostatectomy. This is a surgery to remove the prostate and lymph nodes with the help of a robotic arm that is controlled by a computer.  Orchiectomy. This is a surgery to remove the testicles.  Cryosurgery. This is a surgery to freeze and destroy cancer cells.  Radiation treatment. Types of  radiation treatment include:  External beam radiation. This type aims beams of radiation from outside the body at the prostate to destroy cancerous cells.  Brachytherapy. This type uses radioactive needles, seeds, wires, or tubes that are implanted into the prostate gland. Like external beam radiation, brachytherapy destroys cancerous cells. An advantage is that this type of radiation limits the damage to surrounding  tissue and has fewer side effects.  High-intensity, focused ultrasonography. This treatment destroys cancer cells by delivering high-energy ultrasound waves to the cancerous cells.  Chemotherapy medicines. This treatment kills cancer cells or stops them from multiplying.  Hormone treatment. This treatment involves taking medicines that act on one of the male hormones (testosterone):  By stopping your body from producing testosterone.  By blocking testosterone from reaching cancer cells. Follow these instructions at home:  Take over-the-counter and prescription medicines only as told by your health care provider.  Maintain a healthy diet.  Get plenty of sleep.  Consider joining a support group for men who have prostate cancer. Meeting with a support group may help you learn to cope with the stress of having cancer.  Keep all follow-up visits as told by your health care provider. This is important.  If you have to go to the hospital, notify your cancer specialist (oncologist).  Treatment for prostate cancer may affect sexual function. Continue to have intimate moments with your partner. This may include touching, holding, hugging, and caressing. Contact a health care provider if:  You have trouble urinating.  You have blood in your urine.  You have pain in your hips, back, or chest. Get help right away if:  You have weakness or numbness in your legs.  You have cannot control urination or your bowel movements (incontinence).  You have trouble breathing.  You have sudden chest pain.  You have chills or a fever. Summary  The prostate is a walnut-sized gland that is involved in the production of semen. It is located below a man's bladder, in front of the rectum. Prostate cancer is the abnormal growth of cells in the prostate gland.  Treatment for this condition depends on several factors, including the stage of the cancer, your age, personal preferences, and your overall  health. Talk with your health care provider about treatment options that are recommended for you.  Consider joining a support group for men who have prostate cancer. Meeting with a support group may help you learn to cope with the stress of having cancer. This information is not intended to replace advice given to you by your health care provider. Make sure you discuss any questions you have with your health care provider. Document Released: 10/04/2005 Document Revised: 06/15/2016 Document Reviewed: 06/14/2016 Elsevier Interactive Patient Education  2017 [place] [facility] Instructions  Activity: Get plenty of rest for the remainder of the day. A responsible adult should stay with you for 24 hours following the procedure.  For the next 24 hours, DO NOT: -Drive a car -Paediatric nurse -Drink alcoholic beverages -Take any medication unless instructed by your physician -Make any legal decisions or sign important papers.  Meals: Start with liquid foods such as gelatin or soup. Progress to regular foods as tolerated. Avoid greasy, spicy, heavy foods. If nausea and/or vomiting occur, drink only clear liquids until the nausea and/or vomiting subsides. Call your physician if vomiting continues.  Special Instructions/Symptoms: Your throat may feel dry or sore from the anesthesia or the breathing tube placed in your throat during surgery. If  this causes discomfort, gargle with warm salt water. The discomfort should disappear within 24 hours.  If you had a scopolamine patch placed behind your ear for the management of post- operative nausea and/or vomiting:  1. The medication in the patch is effective for 72 hours, after which it should be removed.  Wrap patch in a tissue and discard in the trash. Wash hands thoroughly with soap and water. 2. You may remove the patch earlier than 72 hours if you experience unpleasant side effects which may include dry mouth, dizziness or  visual disturbances. 3. Avoid touching the patch. Wash your hands with soap and water after contact with the patch.

## 2016-10-25 ENCOUNTER — Telehealth: Payer: Self-pay | Admitting: Radiation Oncology

## 2016-10-25 ENCOUNTER — Encounter (HOSPITAL_BASED_OUTPATIENT_CLINIC_OR_DEPARTMENT_OTHER): Payer: Self-pay | Admitting: Urology

## 2016-10-25 NOTE — Telephone Encounter (Signed)
Patient sent inbasket requesting clarification about upcoming appointments. Explained why two appointments were scheduled for 11/11/16. Patient verbalized understanding.

## 2016-11-02 ENCOUNTER — Ambulatory Visit: Payer: Self-pay | Admitting: Radiation Oncology

## 2016-11-10 ENCOUNTER — Telehealth: Payer: Self-pay | Admitting: *Deleted

## 2016-11-10 NOTE — Telephone Encounter (Signed)
CALLED PATIENT TO REMIND OF POST SEED APPTS. FOR 11-11-16, SPOKE WITH PATIENT AND HE IS AWARE OF THESE APPTS.

## 2016-11-11 ENCOUNTER — Ambulatory Visit
Admission: RE | Admit: 2016-11-11 | Discharge: 2016-11-11 | Disposition: A | Discharge status: Home / Self Care | Payer: Medicare Other | Source: Ambulatory Visit | Attending: Radiation Oncology | Admitting: Radiation Oncology

## 2016-11-11 VITALS — BP 107/73 | HR 66 | Temp 98.3°F | Resp 18

## 2016-11-11 DIAGNOSIS — Y842 Radiological procedure and radiotherapy as the cause of abnormal reaction of the patient, or of later complication, without mention of misadventure at the time of the procedure: Secondary | ICD-10-CM | POA: Insufficient documentation

## 2016-11-11 DIAGNOSIS — C61 Malignant neoplasm of prostate: Secondary | ICD-10-CM | POA: Insufficient documentation

## 2016-11-11 DIAGNOSIS — Z510 Encounter for antineoplastic radiation therapy: Secondary | ICD-10-CM | POA: Diagnosis not present

## 2016-11-11 DIAGNOSIS — R3911 Hesitancy of micturition: Secondary | ICD-10-CM | POA: Insufficient documentation

## 2016-11-11 NOTE — Addendum Note (Signed)
Encounter addended by: Tyler Pita, MD on: 11/11/2016  3:42 PM<BR>    Actions taken: Sign clinical note

## 2016-11-11 NOTE — Progress Notes (Signed)
  Radiation Oncology         [phone] ________________________________  Name: Jeremiah Zhang MRN: [id number]  Date: 11/11/2016  DOB: 02-Mar-1946  COMPLEX SIMULATION NOTE  NARRATIVE:  The patient was brought to the [facility] today following prostate seed implantation approximately one month ago.  Identity was confirmed.  All relevant records and images related to the planned course of therapy were reviewed.  Then, the patient was set-up supine.  CT images were obtained.  The CT images were loaded into the planning software.  Then the prostate and rectum were contoured.  Treatment planning then occurred.  The implanted iodine 125 seeds were identified by the physics staff for projection of radiation distribution  I have requested : 3D Simulation  I have requested a DVH of the following structures: Prostate and rectum.    ________________________________  Sheral Apley [name], M.D.

## 2016-11-11 NOTE — Progress Notes (Signed)
Radiation Oncology         [phone] ________________________________  Name: Jeremiah Zhang MRN: [id number]  Date: 11/11/2016  DOB: Jun 16, 1946  Follow-Up Visit Note  CC: BABAOFF, Caryl Bis, MD  Carolan Clines, MD  Diagnosis: 71 y.o. 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   ICD-9-CM ICD-10-CM   1. Malignant neoplasm of prostate (HCC) 185 C61     Interval Since Last Radiation:  3 weeks 10/22/16 radioactive seed implant 110 Gy in 1 fraction 08/16/16-09/20/16 45 Gy in 25 fractions  Narrative:  The patient returns today for routine follow-up.  He is complaining of increased urinary frequency, urgency and urinary hesitation symptoms. He filled out a questionnaire regarding urinary function today providing and overall IPSS score of 6 characterizing his symptoms as mild. He reports urge urinary incontinence following surgery for which he wears 2-3 ppd but states that this is improving. He also reports that dysuria following surgery has since resolved. He had one episode of gross hematuria within the first week after surgery but denies any further hematuria since that time. His pre-implant IPSS score was 2. He denies any bowel symptoms. He is scheduled to follow up with his urologist tomorrow. He also notes his blood sugar has been elevated since surgery but this appears to be approving over the past week. His blood sugar at 11:30am today was 198.  ALLERGIES:  is allergic to bee venom; crestor [rosuvastatin]; vytorin [ezetimibe-simvastatin]; and dynapen [dicloxacillin].  Meds: Current Outpatient Prescriptions  Medication Sig Dispense Refill  . ACCU-CHEK FASTCLIX LANCETS MISC   0  . Ascorbic Acid (VITAMIN C) 1000 MG tablet Take 1,000 mg by mouth daily.    . bisoprolol-hydrochlorothiazide (ZIAC) 10-6.25 MG per tablet Take 1 tablet by mouth every morning.    . Cholecalciferol (VITAMIN D3) 2000 UNITS TABS Take 1 capsule  by mouth daily.    Marland Kitchen ELIQUIS 5 MG TABS tablet TAKE 1 TABLET BY MOUTH 2 TIMES DAILY 180 tablet 4  . EPINEPHrine 0.3 mg/0.3 mL IJ SOAJ injection Inject into the muscle as needed.    Marland Kitchen esomeprazole (NEXIUM) 40 MG capsule Take 40 mg by mouth every evening.     . furosemide (LASIX) 40 MG tablet Take 40 mg by mouth every morning.    Marland Kitchen glimepiride (AMARYL) 2 MG tablet   0  . glucose blood test strip TEST TWICE DAILY AS INSTRUCTED    . Hyoscyamine Sulfate SL (LEVSIN/SL) 0.125 MG SUBL Place 0.125 mg under the tongue 2 (two) times daily. 20 each 1  . metFORMIN (GLUCOPHAGE-XR) 500 MG 24 hr tablet take 1 tablet by mouth twice a day with meals    . quinapril (ACCUPRIL) 20 MG tablet Take 20 mg by mouth 2 (two) times daily.    . Red Yeast Rice Extract (RED YEAST RICE PO) Take by mouth 2 (two) times daily.    Marland Kitchen trimethoprim (TRIMPEX) 100 MG tablet Take 1 tablet (100 mg total) by mouth 1 day or 1 dose. 30 tablet 1  . clindamycin (CLEOCIN) 150 MG capsule Take by mouth as directed. PRIOR TO DENTAL PROCEDURES     No current facility-administered medications for this encounter.    Facility-Administered Medications Ordered in Other Encounters  Medication Dose Route Frequency Provider Last Rate Last Dose  . acetaminophen (TYLENOL) tablet 975 mg  975 mg Oral Q6H PRN Carolan Clines, MD        Physical Findings: The patient  is in no acute distress. Patient is alert and oriented.  oral temperature is 98.3 F (36.8 C). His blood pressure is 107/73 and his pulse is 66. His respiration is 18 and oxygen saturation is 97%. .  No significant changes.  Lab Findings: Lab Results  Component Value Date   WBC 6.3 10/15/2016   HGB 14.3 10/22/2016   HCT 42.0 10/22/2016   MCV 90.7 10/15/2016   PLT 195 10/15/2016    Radiographic Findings:  Patient underwent CT imaging in our clinic for post implant dosimetry. The CT appears to demonstrate an adequate distribution of radioactive seeds throughout the prostate gland.  There no seeds in her near the rectum. I suspect the final radiation plan and dosimetry will show appropriate coverage of the prostate gland.   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 The patient is recovering from the effects of radiation. His urinary symptoms should gradually improve over the next 4-6 months. We talked about this today. He is encouraged by his improvement already and is otherwise pleased with his outcome.   Plan: Today, I spent time talking with the patient about his prostate seed implant and resolving urinary symptoms. We also talked about long-term follow-up for prostate cancer following seed implant. He understands that ongoing PSA determinations and digital rectal exams will help perform surveillance to rule out disease recurrence. He understands what to expect with his PSA measures. Patient was also educated today about some of the long-term effects from radiation including a small risk for rectal bleeding and possibly erectile dysfunction. We talked about some of the general management approaches to these potential complications. However, I did encourage the patient to contact our office or return at any point if he has questions or concerns related to his previous radiation and prostate cancer.  Ayron Fillinger, PA-C

## 2016-11-11 NOTE — Progress Notes (Addendum)
Weight and vital stable. Denies pain. Pre seed IPSS 2. Post seed IPSS 6. Reports immediately following surgery he had urinary incontinence and urgency. Reports incontinence and urgency has greatly improved in the last several days. Reports he wears 2-3 ppd to manage urinary incontinence. Reports dysuria following surgery has resolved. Denies hematuria. Scheduled to follow up with urologist tomorrow. Reports blood sugars have been elevated since surgery. Reports blood surgery at 1130 was 198.  BP 107/73 (BP Location: Left Arm, Patient Position: Sitting, Cuff Size: Normal)   Pulse 66   Temp 98.3 F (36.8 C) (Oral)   Resp 18   SpO2 97%  Wt Readings from Last 3 Encounters:  10/22/16 233 lb (105.7 kg)  09/17/16 235 lb 6.4 oz (106.8 kg)  09/05/16 234 lb 3.2 oz (106.2 kg)

## 2016-11-15 DIAGNOSIS — C61 Malignant neoplasm of prostate: Secondary | ICD-10-CM | POA: Diagnosis present

## 2016-11-22 ENCOUNTER — Encounter: Payer: Self-pay | Admitting: Radiation Oncology

## 2016-11-28 NOTE — Progress Notes (Signed)
  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 He underwent CT scan complex simulation to delineate the three-dimensional structures of the pelvis and demonstrate the radiation distribution.  Since that time, the seed localization, and complex isodose planning with dose volume histograms have now been completed.  Results:   Prostate Coverage - The dose of radiation delivered to the 90% or more of the prostate gland (D90) was 111.86% of the prescription dose. This exceeds our goal of greater than 90%. Rectal Sparing - The volume of rectal tissue receiving the prescription dose or higher was 0.04 cc. This falls under our thresholds tolerance of 1.0 cc.  Impression: The prostate seed implant appears to show adequate target coverage and appropriate rectal sparing.  Plan:  The patient will continue to follow with urology for ongoing PSA determinations. I would anticipate a high likelihood for local tumor control with minimal risk for rectal morbidity.  ________________________________  Sheral Apley [name], M.D.

## 2017-03-08 IMAGING — NM NM BONE WHOLE BODY
2 series · 2 of 2 positions shown · non-contrast
Comparison: 10/22/2014

Correlation: CT abdomen and pelvis 06/25/2016

CLINICAL DATA: Prostate cancer, history of BILATERAL knee
replacements 5 years ago

EXAM:
NUCLEAR MEDICINE WHOLE BODY BONE SCAN
TECHNIQUE: Whole body anterior and posterior images were obtained approximately
3 hours after intravenous injection of radiopharmaceutical.
RADIOPHARMACEUTICALS:  21.2 mCi Lechnetium-33m MDP IV

[Series 1: whole body · 2.66mm/px · 1 of 1 slices shown (1 of 2)]
[im 1/1]
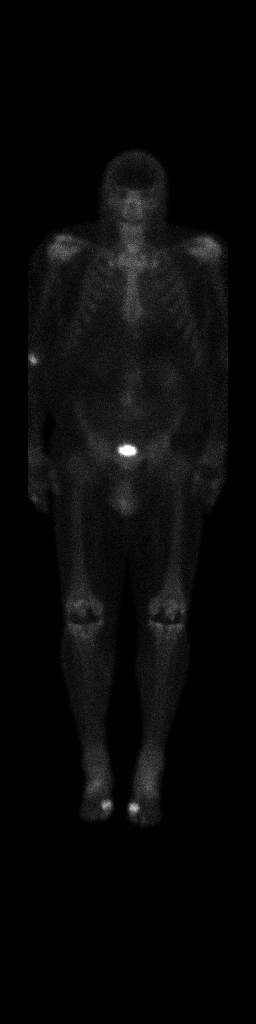

[Series 1: whole body · 2.66mm/px · 1 of 1 slices shown (2 of 2)]
[im 1/1]
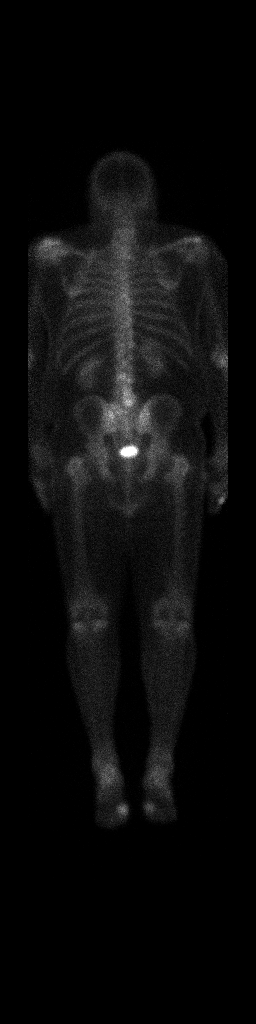

[2 of 2 positions shown; findings below may reference images not displayed]

FINDINGS: Photopenic defects at both knees from prior knee replacements.

Mild uptake at the shoulders, RIGHT hand, RIGHT elbow, and feet,
typically degenerative.

Uptake in the lower lumbar spine at L3, L4 and L5 appears unchanged
since the previous study, potentially degenerative; CT images
demonstrate levoconvex scoliosis and multilevel degenerative disc
disease changes of the lumbar spine at the same levels.

Noted new abnormal areas of increased tracer localization are
identified which are suspicious for osseous metastatic disease.

Uptake seen previously in the posterior LEFT eleventh rib is no
longer identified.
IMPRESSION: Scattered degenerative type uptake as above.

Stable uptake in lumbar spine since 09/22/2015, question due to
degenerative disc/facet disease changes and scoliosis present on CT.

No new scintigraphic abnormalities identified to suggest osseous
metastases.

## 2017-05-30 ENCOUNTER — Encounter
Admission: RE | Disposition: A | Discharge status: Home / Self Care | Payer: Self-pay | Source: Ambulatory Visit | Attending: Unknown Physician Specialty

## 2017-05-30 ENCOUNTER — Ambulatory Visit: Payer: Medicare Other | Admitting: Anesthesiology

## 2017-05-30 ENCOUNTER — Ambulatory Visit
Admission: RE | Admit: 2017-05-30 | Discharge: 2017-05-30 | Disposition: A | Discharge status: Home / Self Care | Payer: Medicare Other | Source: Ambulatory Visit | Attending: Unknown Physician Specialty | Admitting: Unknown Physician Specialty

## 2017-05-30 DIAGNOSIS — K5730 Diverticulosis of large intestine without perforation or abscess without bleeding: Secondary | ICD-10-CM | POA: Insufficient documentation

## 2017-05-30 DIAGNOSIS — E118 Type 2 diabetes mellitus with unspecified complications: Secondary | ICD-10-CM | POA: Insufficient documentation

## 2017-05-30 DIAGNOSIS — E291 Testicular hypofunction: Secondary | ICD-10-CM | POA: Insufficient documentation

## 2017-05-30 DIAGNOSIS — G629 Polyneuropathy, unspecified: Secondary | ICD-10-CM | POA: Insufficient documentation

## 2017-05-30 DIAGNOSIS — K640 First degree hemorrhoids: Secondary | ICD-10-CM | POA: Diagnosis not present

## 2017-05-30 DIAGNOSIS — Z8673 Personal history of transient ischemic attack (TIA), and cerebral infarction without residual deficits: Secondary | ICD-10-CM | POA: Diagnosis not present

## 2017-05-30 DIAGNOSIS — M1990 Unspecified osteoarthritis, unspecified site: Secondary | ICD-10-CM | POA: Insufficient documentation

## 2017-05-30 DIAGNOSIS — K219 Gastro-esophageal reflux disease without esophagitis: Secondary | ICD-10-CM | POA: Insufficient documentation

## 2017-05-30 DIAGNOSIS — Z79899 Other long term (current) drug therapy: Secondary | ICD-10-CM | POA: Insufficient documentation

## 2017-05-30 DIAGNOSIS — K6289 Other specified diseases of anus and rectum: Secondary | ICD-10-CM | POA: Insufficient documentation

## 2017-05-30 DIAGNOSIS — K449 Diaphragmatic hernia without obstruction or gangrene: Secondary | ICD-10-CM | POA: Diagnosis not present

## 2017-05-30 DIAGNOSIS — G4730 Sleep apnea, unspecified: Secondary | ICD-10-CM | POA: Diagnosis not present

## 2017-05-30 DIAGNOSIS — Z1211 Encounter for screening for malignant neoplasm of colon: Secondary | ICD-10-CM | POA: Diagnosis not present

## 2017-05-30 DIAGNOSIS — Z800 Family history of malignant neoplasm of digestive organs: Secondary | ICD-10-CM | POA: Diagnosis not present

## 2017-05-30 DIAGNOSIS — I10 Essential (primary) hypertension: Secondary | ICD-10-CM | POA: Insufficient documentation

## 2017-05-30 DIAGNOSIS — Z8546 Personal history of malignant neoplasm of prostate: Secondary | ICD-10-CM | POA: Insufficient documentation

## 2017-05-30 DIAGNOSIS — Z7901 Long term (current) use of anticoagulants: Secondary | ICD-10-CM | POA: Diagnosis not present

## 2017-05-30 DIAGNOSIS — G4733 Obstructive sleep apnea (adult) (pediatric): Secondary | ICD-10-CM | POA: Insufficient documentation

## 2017-05-30 DIAGNOSIS — Z888 Allergy status to other drugs, medicaments and biological substances status: Secondary | ICD-10-CM | POA: Diagnosis not present

## 2017-05-30 DIAGNOSIS — Z86718 Personal history of other venous thrombosis and embolism: Secondary | ICD-10-CM | POA: Diagnosis not present

## 2017-05-30 DIAGNOSIS — E785 Hyperlipidemia, unspecified: Secondary | ICD-10-CM | POA: Diagnosis not present

## 2017-05-30 DIAGNOSIS — Z96653 Presence of artificial knee joint, bilateral: Secondary | ICD-10-CM | POA: Diagnosis not present

## 2017-05-30 DIAGNOSIS — Z91030 Bee allergy status: Secondary | ICD-10-CM | POA: Insufficient documentation

## 2017-05-30 DIAGNOSIS — Z7982 Long term (current) use of aspirin: Secondary | ICD-10-CM | POA: Diagnosis not present

## 2017-05-30 HISTORY — PX: COLONOSCOPY WITH PROPOFOL: SHX5780

## 2017-05-30 LAB — GLUCOSE, CAPILLARY: GLUCOSE-CAPILLARY: 171 mg/dL — AB (ref 65–99)

## 2017-05-30 SURGERY — COLONOSCOPY WITH PROPOFOL
Anesthesia: General

## 2017-05-30 MED ORDER — LIDOCAINE HCL (PF) 2 % IJ SOLN
INTRAMUSCULAR | Status: DC | PRN
  Administered 2017-05-30: 50 mg

## 2017-05-30 MED ORDER — FENTANYL CITRATE (PF) 100 MCG/2ML IJ SOLN
INTRAMUSCULAR | Status: AC
  Filled 2017-05-30: qty 2

## 2017-05-30 MED ORDER — FENTANYL CITRATE (PF) 100 MCG/2ML IJ SOLN
INTRAMUSCULAR | Status: DC | PRN
  Administered 2017-05-30 (×2): 50 ug via INTRAVENOUS

## 2017-05-30 MED ORDER — MIDAZOLAM HCL 2 MG/2ML IJ SOLN
INTRAMUSCULAR | Status: AC
  Filled 2017-05-30: qty 2

## 2017-05-30 MED ORDER — VANCOMYCIN HCL IN DEXTROSE 1-5 GM/200ML-% IV SOLN
1000.0000 mg | Freq: Once | INTRAVENOUS | Status: AC
  Administered 2017-05-30: 1000 mg via INTRAVENOUS

## 2017-05-30 MED ORDER — PROPOFOL 500 MG/50ML IV EMUL
INTRAVENOUS | Status: DC | PRN
  Administered 2017-05-30: 75 ug/kg/min via INTRAVENOUS

## 2017-05-30 MED ORDER — VANCOMYCIN HCL IN DEXTROSE 1-5 GM/200ML-% IV SOLN
INTRAVENOUS | Status: AC
  Filled 2017-05-30: qty 200

## 2017-05-30 MED ORDER — GENTAMICIN SULFATE 40 MG/ML IJ SOLN
100.0000 mg | Freq: Once | INTRAVENOUS | Status: AC
  Administered 2017-05-30: 100 mg via INTRAVENOUS
  Filled 2017-05-30: qty 2.5

## 2017-05-30 MED ORDER — SODIUM CHLORIDE 0.9 % IV SOLN: INTRAVENOUS | Status: DC

## 2017-05-30 MED ORDER — MIDAZOLAM HCL 5 MG/5ML IJ SOLN
INTRAMUSCULAR | Status: DC | PRN
  Administered 2017-05-30: 2 mg via INTRAVENOUS

## 2017-05-30 MED ORDER — PROPOFOL 10 MG/ML IV BOLUS
INTRAVENOUS | Status: DC | PRN
  Administered 2017-05-30: 30 mg via INTRAVENOUS
  Administered 2017-05-30: 20 mg via INTRAVENOUS

## 2017-05-30 MED ORDER — LIDOCAINE HCL (PF) 2 % IJ SOLN
INTRAMUSCULAR | Status: AC
  Filled 2017-05-30: qty 2

## 2017-05-30 MED ORDER — SODIUM CHLORIDE 0.9 % IV SOLN
INTRAVENOUS | Status: DC
  Administered 2017-05-30: 08:00:00 via INTRAVENOUS

## 2017-05-30 MED ORDER — GENTAMICIN IN SALINE 1-0.9 MG/ML-% IV SOLN
100.0000 mg | Freq: Once | INTRAVENOUS | Status: DC
  Filled 2017-05-30: qty 100

## 2017-05-30 NOTE — Op Note (Signed)
[facility] Gastroenterology Patient Name: Jeremiah Zhang Procedure Date: 05/30/2017 9:47 AM MRN: [id number] Account #: [id number] Date of Birth: Jun 06, 1946 Admit Type: Outpatient Age: 71 Room: [facility] ENDO ROOM 3 Gender: Male Note Status: Finalized Procedure:            Colonoscopy Indications:          Screening in patient at increased risk: Family history                        of 1st-degree relative with colorectal cancer Providers:            [name], MD Referring MD:         Caprice Renshaw MD (Referring MD) Medicines:            Propofol per Anesthesia Complications:        No immediate complications. Procedure:            Pre-Anesthesia Assessment:                       - After reviewing the risks and benefits, the patient                        was deemed in satisfactory condition to undergo the                        procedure.                       After obtaining informed consent, the colonoscope was                        passed under direct vision. Throughout the procedure,                        the patient's blood pressure, pulse, and oxygen                        saturations were monitored continuously. The                        Colonoscope was introduced through the anus and                        advanced to the the cecum, identified by appendiceal                        orifice and ileocecal valve. The colonoscopy was                        performed without difficulty. The patient tolerated the                        procedure well. Findings:      Internal hemorrhoids were found during endoscopy. The hemorrhoids were       medium-sized and Grade I (internal hemorrhoids that do not prolapse).      Many small-mouthed diverticula were found in the sigmoid colon.      A diffuse area of minimal granular mucosa was found in the distal rectum.      The exam was otherwise without abnormality. Impression:           -  Internal hemorrhoids.                       - Diverticulosis in the sigmoid colon.                       - Granularity in the distal rectum.                       - The examination was otherwise normal.                       - No specimens collected. Recommendation:       - Repeat colonoscopy in 5 years for screening purposes. [name], MD 05/30/2017 10:21:15 AM This report has been signed electronically. Number of Addenda: 0 Note Initiated On: 05/30/2017 9:47 AM Scope Withdrawal Time: 0 hours 11 minutes 7 seconds  Total Procedure Duration: 0 hours 22 minutes 18 seconds       [facility]

## 2017-05-30 NOTE — Transfer of Care (Signed)
Immediate Anesthesia Transfer of Care Note  Patient: Jeremiah Zhang  Procedure(s) Performed: Procedure(s): COLONOSCOPY WITH PROPOFOL (N/A)  Patient Location: PACU  Anesthesia Type:General  Level of Consciousness: sedated  Airway & Oxygen Therapy: Patient Spontanous Breathing and Patient connected to nasal cannula oxygen  Post-op Assessment: Report given to RN and Post -op Vital signs reviewed and stable  Post vital signs: Reviewed and stable  Last Vitals:  Vitals:   05/30/17 0800 05/30/17 1020  BP: 133/88 (!) 87/51  Pulse: 60 68  Resp: 18 16  Temp: (!) 36.3 C (!) 35.7 C  SpO2: 96%     Last Pain:  Vitals:   05/30/17 1020  TempSrc: Tympanic         Complications: No apparent anesthesia complications

## 2017-05-30 NOTE — H&P (Signed)
Primary Care Physician:  [name], MD Primary Gastroenterologist:  Dr. Vira Agar  Pre-Procedure History & Physical: HPI:  Jeremiah Zhang is a 71 y.o. male is here for an colonoscopy.   Past Medical History:  Diagnosis Date  . Anticoagulant long-term use    ELIQUIS  . Arthritis   . ED (erectile dysfunction)   . GERD (gastroesophageal reflux disease)   . Hiatal hernia   . History of DVT of lower extremity    POST OP KNEE SURGERY  . History of transient ischemic attack (TIA)    PER PT HX  ?POSSIBLE-- PT ON ELIQUIS  . Hyperlipidemia   . Hypertension   . Hypogonadism male   . OSA (obstructive sleep apnea)    per pt positive sleep study yrs ago recommended CPAP --  pt Non- compliant -- states uses Breath Rite Nasal Strips  . Peripheral neuropathy   . Prostate cancer [facility]) urologist-  dr tannenbaum/  oncologist-- dr [name]   dx 03-25-2015  T1c, Gleason 4+3  s/p  cryoablation prostate 06-02-2015/  external  radiation therapy 08-16-2016 to 09-20-2016/  scheduled for radioactive seed implants 10-22-2016  . Type 2 diabetes mellitus ([place])    followed by dr [name] [facility] clinic)  last A1c 8.4 on 07-27-2016 per lov note  . Wears glasses   . Wears hearing aid    bilateral    Past Surgical History:  Procedure Laterality Date  . CRYOABLATION N/A 06/02/2015   Procedure: CRYO ABLATION PROSTATE;  Surgeon: Carolan Clines, MD;  Location: [facility];  Service: Urology;  Laterality: N/A;  . ELBOW BURSA SURGERY Left Feb 1997  . RADIOACTIVE SEED IMPLANT N/A 10/22/2016   Procedure: RADIOACTIVE SEED IMPLANT/BRACHYTHERAPY IMPLANT, URETHRAL STRICTURE DIALIATION;  Surgeon: Carolan Clines, MD;  Location: [place];  Service: Urology;  Laterality: N/A;  . SHOULDER OPEN ROTATOR CUFF REPAIR Left 09-04-2014  . TOTAL KNEE ARTHROPLASTY Bilateral right 08-13-2006  &  left 11-14-2008  . UMBILICAL HERNIA REPAIR  06-23-2012    Prior to Admission  medications   Medication Sig Start Date End Date Taking? Authorizing Provider  ACCU-CHEK FASTCLIX LANCETS [place]  10/29/16  Yes [provider]  Ascorbic Acid (VITAMIN C) 1000 MG tablet Take 1,000 mg by mouth daily.   Yes [provider]  aspirin-acetaminophen-caffeine (EXCEDRIN MIGRAINE) 3031883452 MG tablet Take by mouth every 6 (six) hours as needed for headache.   Yes [provider]  bisoprolol-hydrochlorothiazide (ZIAC) 10-6.25 MG per tablet Take 1 tablet by mouth every morning.   Yes [provider]  EPINEPHrine 0.3 mg/0.3 mL IJ SOAJ injection Inject into the muscle as needed.   Yes [provider]  esomeprazole (NEXIUM) 40 MG capsule Take 40 mg by mouth every evening.    Yes [provider]  glimepiride (AMARYL) 2 MG tablet  11/04/16  Yes [provider]  glucose blood test strip TEST TWICE DAILY AS INSTRUCTED 10/29/16  Yes [provider]  ibuprofen (ADVIL,MOTRIN) 200 MG tablet Take 200 mg by mouth every 6 (six) hours as needed.   Yes [provider]  quinapril (ACCUPRIL) 20 MG tablet Take 20 mg by mouth 2 (two) times daily.   Yes [provider]  Red Yeast Rice Extract (RED YEAST RICE PO) Take by mouth 2 (two) times daily.   Yes [provider]  tamsulosin (FLOMAX) 0.4 MG CAPS capsule Take 0.4 mg by mouth.   Yes [provider]  Cholecalciferol (VITAMIN D3) 2000 UNITS TABS  Take 1 capsule by mouth daily.    [provider]  clindamycin (CLEOCIN) 150 MG capsule Take by mouth as directed. PRIOR TO DENTAL PROCEDURES    [provider]  ELIQUIS 5 MG TABS tablet TAKE 1 TABLET BY MOUTH 2 TIMES DAILY 09/07/16   Stegmayer, Joelene Millin A, PA-C  furosemide (LASIX) 40 MG tablet Take 40 mg by mouth every morning.    [provider]  Hyoscyamine Sulfate SL (LEVSIN/SL) 0.125 MG SUBL Place 0.125 mg under the tongue 2 (two) times daily. Patient not taking: Reported on 05/30/2017  10/22/16   Carolan Clines, MD  metFORMIN (GLUCOPHAGE-XR) 500 MG 24 hr tablet take 1 tablet by mouth twice a day with meals 10/04/16   [provider]  trimethoprim (TRIMPEX) 100 MG tablet Take 1 tablet (100 mg total) by mouth 1 day or 1 dose. Patient not taking: Reported on 05/30/2017 10/22/16   Carolan Clines, MD    Allergies as of 03/25/2017 - Review Complete 11/11/2016  Allergen Reaction Noted  . Bee venom Anaphylaxis 05/26/2015  . Crestor [rosuvastatin] Other (See Comments) 05/26/2015  . Vytorin [ezetimibe-simvastatin] Other (See Comments) 05/26/2015  . Dynapen [dicloxacillin] Rash 05/26/2015    Family History  Problem Relation Age of Onset  . Cancer Mother 47       colon/resection    Social History   Social History  . Marital status: Married    Spouse name: N/A  . Number of children: N/A  . Years of education: N/A   Occupational History  . Not on file.   Social History Main Topics  . Smoking status: Never Smoker  . Smokeless tobacco: Never Used  . Alcohol use No  . Drug use: No  . Sexual activity: Not Currently   Other Topics Concern  . Not on file   Social History Narrative  . No narrative on file    Review of Systems: See HPI, otherwise negative ROS  Physical Exam: BP 133/88   Pulse 60   Temp (!) 97.4 F (36.3 C) (Tympanic)   Resp 18   Ht 5\' 10"  (1.778 m)   Wt 105.2 kg (232 lb)   SpO2 96%   BMI 33.29 kg/m  General:   Alert,  pleasant and cooperative in NAD Head:  Normocephalic and atraumatic. Neck:  Supple; no masses or thyromegaly. Lungs:  Clear throughout to auscultation.    Heart:  Regular rate and rhythm. Abdomen:  Soft, nontender and nondistended. Normal bowel sounds, without guarding, and without rebound.   Neurologic:  Alert and  oriented x4;  grossly normal neurologically.  Impression/Plan: Jeremiah Zhang is here for an colonoscopy to be performed for colonoscopy.  Risks, benefits, limitations, and alternatives  regarding  colonoscopy have been reviewed with the patient.  Questions have been answered.  All parties agreeable.   Gaylyn Cheers, MD  05/30/2017, 9:47 AM

## 2017-05-30 NOTE — Anesthesia Preprocedure Evaluation (Signed)
Anesthesia Evaluation  Patient identified by MRN, date of birth, ID band Patient awake    Reviewed: Allergy & Precautions, NPO status , Patient's Chart, lab work & pertinent test results  History of Anesthesia Complications Negative for: history of anesthetic complications  Airway Mallampati: II       Dental   Pulmonary sleep apnea ,           Cardiovascular hypertension, Pt. on medications      Neuro/Psych negative neurological ROS     GI/Hepatic Neg liver ROS, GERD  Medicated and Controlled,  Endo/Other  diabetes, Type 2, Oral Hypoglycemic Agents  Renal/GU negative Renal ROS     Musculoskeletal   Abdominal   Peds  Hematology   Anesthesia Other Findings   Reproductive/Obstetrics                             Anesthesia Physical Anesthesia Plan  ASA: III  Anesthesia Plan: General   Post-op Pain Management:    Induction: Intravenous  PONV Risk Score and Plan:   Airway Management Planned:   Additional Equipment:   Intra-op Plan:   Post-operative Plan:   Informed Consent: I have reviewed the patients History and Physical, chart, labs and discussed the procedure including the risks, benefits and alternatives for the proposed anesthesia with the patient or authorized representative who has indicated his/her understanding and acceptance.     Plan Discussed with:   Anesthesia Plan Comments:         Anesthesia Quick Evaluation

## 2017-05-30 NOTE — Anesthesia Post-op Follow-up Note (Signed)
Anesthesia QCDR form completed.        

## 2017-05-30 NOTE — Anesthesia Postprocedure Evaluation (Signed)
Anesthesia Post Note  Patient: Jeremiah Zhang  Procedure(s) Performed: Procedure(s) (LRB): COLONOSCOPY WITH PROPOFOL (N/A)  Patient location during evaluation: Endoscopy Anesthesia Type: General Level of consciousness: awake and alert Pain management: pain level controlled Vital Signs Assessment: post-procedure vital signs reviewed and stable Respiratory status: spontaneous breathing and respiratory function stable Cardiovascular status: stable Anesthetic complications: no     Last Vitals:  Vitals:   05/30/17 0800 05/30/17 1020  BP: 133/88 (!) 87/51  Pulse: 60 68  Resp: 18 16  Temp: (!) 36.3 C (!) 35.7 C  SpO2: 96%     Last Pain:  Vitals:   05/30/17 1020  TempSrc: Tympanic                 KEPHART,WILLIAM K

## 2017-05-31 ENCOUNTER — Encounter: Payer: Self-pay | Admitting: Unknown Physician Specialty

## 2017-12-02 ENCOUNTER — Other Ambulatory Visit (INDEPENDENT_AMBULATORY_CARE_PROVIDER_SITE_OTHER): Payer: Self-pay | Admitting: Vascular Surgery

## 2018-01-19 ENCOUNTER — Other Ambulatory Visit: Payer: Self-pay

## 2018-01-20 ENCOUNTER — Telehealth: Payer: Self-pay

## 2018-01-20 NOTE — Telephone Encounter (Signed)
Pt called stating he is transferring care from [facility] to [facility] with Dr. Bernardo Heater. Pt stated that he has prostate cancer and is over due for his PSA and testosterone labs. Pt demanded labs be drawn prior to NP appt with Dr. Bernardo Heater. Reinforced with pt he is a new patient and therefore labs cant be ordered until he is seen. Pt voiced frustration. Pt then stated that [facility] website and front door is out of date and he wants it fixed today. Made pt aware [facility] does not control the website and [facility] does. Pt wanted a phone number to call and have the website fixed. Provided pt with main hospital line. Pt voiced understanding.

## 2018-01-25 ENCOUNTER — Other Ambulatory Visit: Payer: Self-pay

## 2018-02-06 ENCOUNTER — Ambulatory Visit: Payer: Medicare Other | Admitting: Urology

## 2018-02-06 ENCOUNTER — Encounter: Payer: Self-pay | Admitting: Urology

## 2018-02-06 VITALS — BP 159/100 | HR 71 | Resp 16 | Ht 70.0 in | Wt 239.2 lb

## 2018-02-06 DIAGNOSIS — C61 Malignant neoplasm of prostate: Secondary | ICD-10-CM | POA: Diagnosis not present

## 2018-02-06 LAB — URINALYSIS, COMPLETE
Bilirubin, UA: NEGATIVE
GLUCOSE, UA: NEGATIVE
Ketones, UA: NEGATIVE
NITRITE UA: POSITIVE — AB
RBC, UA: NEGATIVE
Specific Gravity, UA: 1.025 (ref 1.005–1.030)
UUROB: 0.2 mg/dL (ref 0.2–1.0)
pH, UA: 5.5 (ref 5.0–7.5)

## 2018-02-06 LAB — MICROSCOPIC EXAMINATION
EPITHELIAL CELLS (NON RENAL): NONE SEEN /HPF (ref 0–10)
RBC MICROSCOPIC, UA: NONE SEEN /HPF (ref 0–2)

## 2018-02-06 NOTE — Progress Notes (Signed)
02/06/2018 3:38 PM   Jeremiah Zhang [birth year] [id number]  Referring provider: [name], MD [id number] [facility] [place], [place] [address]  Chief Complaint  Patient presents with  . Prostate Cancer   Urologic problem list: -Gleason 4+3 adenocarcinoma left base diagnosed 2016 and treated with focal cryotherapy by Dr. 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  He was previously followed by [facility] in [place].  He last saw Dr. Bess Harvest on 08/16/2017.  PSA at that visit was 0.059.  He was also having refractory lower urinary tract symptoms and failed medical management including anticholinergics, alpha blockers and Myrbetriq.  He was going to start PTNS however states he went to [facility] and started taking saw palmetto and noted marked improvement in his voiding symptoms.  He states his current voiding pattern is not bothersome.  He denies dysuria or gross hematuria.  He did have labs drawn with his PCP and December 2018 and his PSA was 0.03. A testosterone level was 261.  He states Dr. [name] had told him once his PSA had stabilized he could be started on testosterone replacement therapy.  He states he did not receive Lupron at the time of his radiation.  PMH: Past Medical History:  Diagnosis Date  . Anticoagulant long-term use    ELIQUIS  . Arthritis   . ED (erectile dysfunction)   . GERD (gastroesophageal reflux disease)   . Hiatal hernia   . History of DVT of lower extremity    POST OP KNEE SURGERY  . History of transient ischemic attack (TIA)    PER PT HX  ?POSSIBLE-- PT ON ELIQUIS  . Hyperlipidemia   . Hypertension   . Hypogonadism male   . OSA (obstructive sleep apnea)    per pt positive sleep study yrs  ago recommended CPAP --  pt Non- compliant -- states uses Breath Rite Nasal Strips  . Peripheral neuropathy   . Prostate cancer [facility]) urologist-  dr tannenbaum/  oncologist-- dr [name]   dx 03-25-2015  T1c, Gleason 4+3  s/p  cryoablation prostate 06-02-2015/  external  radiation therapy 08-16-2016 to 09-20-2016/  scheduled for radioactive seed implants 10-22-2016  . Type 2 diabetes mellitus ([place])    followed by dr [name] [facility] clinic)  last A1c 8.4 on 07-27-2016 per lov note  . Wears glasses   . Wears hearing aid    bilateral    Surgical History: Past Surgical History:  Procedure Laterality Date  . COLONOSCOPY WITH PROPOFOL N/A 05/30/2017   Procedure: COLONOSCOPY WITH PROPOFOL;  Surgeon: [name], MD;  Location: [facility];  Service: Endoscopy;  Laterality: N/A;  . CRYOABLATION N/A 06/02/2015   Procedure: CRYO ABLATION PROSTATE;  Surgeon: Carolan Clines, MD;  Location: [facility];  Service: Urology;  Laterality: N/A;  . ELBOW BURSA SURGERY Left Feb 1997  . RADIOACTIVE SEED IMPLANT N/A 10/22/2016   Procedure: RADIOACTIVE SEED IMPLANT/BRACHYTHERAPY IMPLANT, URETHRAL STRICTURE DIALIATION;  Surgeon: Carolan Clines, MD;  Location: [place];  Service: Urology;  Laterality: N/A;  . SHOULDER OPEN ROTATOR CUFF REPAIR Left 09-04-2014  . TOTAL KNEE ARTHROPLASTY Bilateral right 08-13-2006  &  left 11-14-2008  . UMBILICAL HERNIA REPAIR  06-23-2012    Home Medications:  Allergies as of 02/06/2018  Reactions   Bee Venom Anaphylaxis   Crestor [rosuvastatin] Other (See Comments)   "muscle weakness and discomfort"   Vytorin [ezetimibe-simvastatin] Other (See Comments)   "muscle weakness and discomfort"   Dynapen [dicloxacillin] Rash      Medication List        Accurate as of 02/06/18  3:38 PM. Always use your most recent med list.          ACCU-CHEK FASTCLIX LANCETS Misc   aspirin-acetaminophen-caffeine 976-734-19 MG  tablet Commonly known as:  EXCEDRIN MIGRAINE Take by mouth every 6 (six) hours as needed for headache.   bisoprolol-hydrochlorothiazide 10-6.25 MG tablet Commonly known as:  ZIAC Take 1 tablet by mouth every morning.   ELIQUIS 5 MG Tabs tablet Generic drug:  apixaban take 1 tablet by mouth twice a day   EPINEPHrine 0.3 mg/0.3 mL [place]aj injection Commonly known as:  EPI-PEN Inject into the muscle as needed.   esomeprazole 40 MG capsule Commonly known as:  NEXIUM Take 40 mg by mouth every evening.   glimepiride 2 MG tablet Commonly known as:  AMARYL   glucose blood test strip TEST TWICE DAILY AS INSTRUCTED   ibuprofen 200 MG tablet Commonly known as:  ADVIL,MOTRIN Take 200 mg by mouth every 6 (six) hours as needed.   metFORMIN 500 MG 24 hr tablet Commonly known as:  GLUCOPHAGE-XR take 1 tablet by mouth twice a day with meals   quinapril 20 MG tablet Commonly known as:  ACCUPRIL Take 20 mg by mouth 2 (two) times daily.   RED YEAST RICE PO Take by mouth 2 (two) times daily.   vitamin C 1000 MG tablet Take 1,000 mg by mouth daily.   Vitamin D3 2000 units Tabs Take 1 capsule by mouth daily.       Allergies:  Allergies  Allergen Reactions  . Bee Venom Anaphylaxis  . Crestor [Rosuvastatin] Other (See Comments)    "muscle weakness and discomfort"  . Vytorin [Ezetimibe-Simvastatin] Other (See Comments)    "muscle weakness and discomfort"  . Dynapen [Dicloxacillin] Rash    Family History: Family History  Problem Relation Age of Onset  . Cancer Mother 10       colon/resection    Social History:  reports that he has never smoked. He has never used smokeless tobacco. He reports that he does not drink alcohol or use drugs.  ROS: UROLOGY Frequent Urination?: Yes Hard to postpone urination?: Yes Burning/pain with urination?: No Get up at night to urinate?: Yes Leakage of urine?: Yes Urine stream starts and stops?: No Trouble starting stream?: No Do you have  to strain to urinate?: No Blood in urine?: No Urinary tract infection?: No Sexually transmitted disease?: No Injury to kidneys or bladder?: No Painful intercourse?: No Weak stream?: No Erection problems?: Yes Penile pain?: No  Gastrointestinal Nausea?: No Vomiting?: No Indigestion/heartburn?: Yes Diarrhea?: Yes Constipation?: Yes  Constitutional Fever: No Night sweats?: No Weight loss?: No Fatigue?: Yes  Skin Skin rash/lesions?: No Itching?: No  Eyes Blurred vision?: No Double vision?: No  Ears/Nose/Throat Sore throat?: No Sinus problems?: No  Hematologic/Lymphatic Swollen glands?: No Easy bruising?: No  Cardiovascular Leg swelling?: No Chest pain?: No  Respiratory Cough?: No Shortness of breath?: No  Endocrine Excessive thirst?: No  Musculoskeletal Back pain?: Yes Joint pain?: No  Neurological Headaches?: No Dizziness?: No  Psychologic Depression?: No Anxiety?: No  Physical Exam: BP (!) 159/100   Pulse 71   Resp 16   Ht 5\' 10"  (1.778 m)  Wt 239 lb 3.2 oz (108.5 kg)   SpO2 98%   BMI 34.32 kg/m   Constitutional:  Alert and oriented, No acute distress. HEENT: [place] AT, moist mucus membranes.  Trachea midline, no masses. Cardiovascular: No clubbing, cyanosis, or edema. Respiratory: Normal respiratory effort, no increased work of breathing. GI: Abdomen is soft, nontender, nondistended, no abdominal masses GU: No CVA tenderness.  Anterior rectal wall smooth with prostate barely palpable Skin: No rashes, bruises or suspicious lesions. Neurologic: Grossly intact, no focal deficits, moving all 4 extremities. Psychiatric: Normal mood and affect.  Laboratory Data: Lab Results  Component Value Date   WBC 6.3 10/15/2016   HGB 14.3 10/22/2016   HCT 42.0 10/22/2016   MCV 90.7 10/15/2016   PLT 195 10/15/2016    Lab Results  Component Value Date   CREATININE 1.20 10/15/2016     Assessment & Plan:   72 year old male with moderate risk  prostate cancer status post focal cryotherapy and subsequent combined EB RT/brachytherapy.  PSA was ordered.  He also requested a testosterone level.  Follow-up 3 months.   Return in about 3 months (around 05/08/2018) for Recheck.  Abbie Sons, [place] [address], [place], [place] [address] [phone]

## 2018-02-07 LAB — TESTOSTERONE: TESTOSTERONE: 140 ng/dL — AB (ref 264–916)

## 2018-02-08 LAB — CULTURE, URINE COMPREHENSIVE

## 2018-02-10 ENCOUNTER — Telehealth: Payer: Self-pay | Admitting: Family Medicine

## 2018-02-10 ENCOUNTER — Other Ambulatory Visit: Payer: Self-pay | Admitting: Urology

## 2018-02-10 MED ORDER — CEFUROXIME AXETIL 500 MG PO TABS: 500.0000 mg | ORAL_TABLET | Freq: Two times a day (BID) | ORAL | 0 refills | Status: AC

## 2018-02-10 NOTE — Telephone Encounter (Signed)
-----   Message from Abbie Sons, MD sent at 02/10/2018 12:33 PM EDT ----- Testosterone level was 140.  His urine culture grew a low level of bacteria and will send in an antibiotic prescription to his pharmacy.  A PSA was ordered but I do not see the results.  Was this run?

## 2018-02-10 NOTE — Telephone Encounter (Signed)
Patient notified and I called [facility] to have PSA added.

## 2018-02-13 ENCOUNTER — Telehealth: Payer: Self-pay

## 2018-02-13 NOTE — Telephone Encounter (Signed)
-----   Message from Abbie Sons, MD sent at 02/13/2018 12:43 PM EDT ----- PSA was <0.1; testosterone level 140

## 2018-02-14 LAB — PSA

## 2018-02-14 LAB — SPECIMEN STATUS REPORT

## 2018-05-09 ENCOUNTER — Other Ambulatory Visit: Payer: Medicare Other

## 2018-05-09 DIAGNOSIS — C61 Malignant neoplasm of prostate: Secondary | ICD-10-CM

## 2018-05-10 LAB — PSA

## 2018-05-11 ENCOUNTER — Ambulatory Visit: Payer: Medicare Other | Admitting: Urology

## 2018-05-11 ENCOUNTER — Encounter: Payer: Self-pay | Admitting: Urology

## 2018-05-11 VITALS — BP 148/101 | HR 75 | Ht 70.0 in | Wt 230.6 lb

## 2018-05-11 DIAGNOSIS — E291 Testicular hypofunction: Secondary | ICD-10-CM | POA: Diagnosis not present

## 2018-05-11 DIAGNOSIS — C61 Malignant neoplasm of prostate: Secondary | ICD-10-CM | POA: Diagnosis not present

## 2018-05-11 NOTE — Progress Notes (Signed)
05/11/2018 11:08 AM   Jeremiah Zhang 07-01-1946 [id number]  Referring provider: [name], MD [phone] [address] [facility] [place], [place] [address]  Chief Complaint  Patient presents with  . Prostate Cancer   Urologic problem list: -Gleason 4+3 adenocarcinoma left base diagnosed 2016 and treated with focal cryotherapy by Dr. 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  He has stable lower urinary tract symptoms.  He has a history of hypogonadism and his most bothersome complaint is decreased energy, fatigue and depressed mood.  He had previously been on TRT prior to prostate cancer diagnosis and states he discussed with Dr. [name] restarting testosterone 2 years after treatment was completed.  Testosterone level at last visit in April was 140 ng/dL.  PSA on 05/09/2018 remains undetectable at <0.1.   PMH: Past Medical History:  Diagnosis Date  . Anticoagulant long-term use    ELIQUIS  . Arthritis   . ED (erectile dysfunction)   . GERD (gastroesophageal reflux disease)   . Hiatal hernia   . History of DVT of lower extremity    POST OP KNEE SURGERY  . History of transient ischemic attack (TIA)    PER PT HX  ?POSSIBLE-- PT ON ELIQUIS  . Hyperlipidemia   . Hypertension   . Hypogonadism male   . OSA (obstructive sleep apnea)    per pt positive sleep study yrs ago recommended CPAP --  pt Non- compliant -- states uses Breath Rite Nasal Strips  . Peripheral neuropathy   . Prostate cancer [facility]) urologist-  dr tannenbaum/  oncologist-- dr [name]   dx 03-25-2015  T1c, Gleason 4+3  s/p  cryoablation prostate 06-02-2015/  external  radiation therapy 08-16-2016 to 09-20-2016/  scheduled for radioactive seed implants 10-22-2016  . Type 2 diabetes  mellitus ([place])    followed by dr [name] [facility])  last A1c 8.4 on 07-27-2016 per lov note  . Wears glasses   . Wears hearing aid    bilateral    Surgical History: Past Surgical History:  Procedure Laterality Date  . COLONOSCOPY WITH PROPOFOL N/A 05/30/2017   Procedure: COLONOSCOPY WITH PROPOFOL;  Surgeon: [name], MD;  Location: [facility] ENDOSCOPY;  Service: Endoscopy;  Laterality: N/A;  . CRYOABLATION N/A 06/02/2015   Procedure: CRYO ABLATION PROSTATE;  Surgeon: Carolan Clines, MD;  Location: [facility];  Service: Urology;  Laterality: N/A;  . ELBOW BURSA SURGERY Left Feb 1997  . RADIOACTIVE SEED IMPLANT N/A 10/22/2016   Procedure: RADIOACTIVE SEED IMPLANT/BRACHYTHERAPY IMPLANT, URETHRAL STRICTURE DIALIATION;  Surgeon: Carolan Clines, MD;  Location: [facility];  Service: Urology;  Laterality: N/A;  . SHOULDER OPEN ROTATOR CUFF REPAIR Left 09-04-2014  . TOTAL KNEE ARTHROPLASTY Bilateral right 08-13-2006  &  left 11-14-2008  . UMBILICAL HERNIA REPAIR  06-23-2012    Home Medications:  Allergies as of 05/11/2018      Reactions   Bee Venom Anaphylaxis   Crestor [rosuvastatin] Other (See Comments)   "muscle weakness and discomfort"   Vytorin [ezetimibe-simvastatin] Other (See Comments)   "muscle weakness and discomfort"   Dynapen [dicloxacillin] Rash      Medication List        Accurate as of 05/11/18 11:08 AM. Always use your most recent med list.          ACCU-CHEK  FASTCLIX LANCETS Misc   aspirin-acetaminophen-caffeine 998-338-25 MG tablet Commonly known as:  EXCEDRIN MIGRAINE Take by mouth every 6 (six) hours as needed for headache.   bisoprolol-hydrochlorothiazide 10-6.25 MG tablet Commonly known as:  ZIAC Take 1 tablet by mouth every morning.   cetirizine 10 MG tablet Commonly known as:  ZYRTEC   clindamycin 300 MG capsule Commonly known as:  CLEOCIN TK ONE C PO  QID AC  AND HS   ELIQUIS 5 MG Tabs  tablet Generic drug:  apixaban take 1 tablet by mouth twice a day   EPINEPHrine 0.3 mg/0.3 mL Soaj injection Commonly known as:  EPI-PEN Inject into the muscle as needed.   esomeprazole 40 MG capsule Commonly known as:  NEXIUM Take 40 mg by mouth every evening.   fluticasone 50 MCG/ACT nasal spray Commonly known as:  FLONASE SHAKE LQ AND U 1 SPR IEN BID   glimepiride 2 MG tablet Commonly known as:  AMARYL   glucose blood test strip TEST TWICE DAILY AS INSTRUCTED   ibuprofen 200 MG tablet Commonly known as:  ADVIL,MOTRIN Take 200 mg by mouth every 6 (six) hours as needed.   metFORMIN 500 MG 24 hr tablet Commonly known as:  GLUCOPHAGE-XR take 1 tablet by mouth twice a day with meals   mupirocin ointment 2 % Commonly known as:  BACTROBAN APP EXT AA TID   ONGLYZA 5 MG Tabs tablet Generic drug:  saxagliptin HCl Take 5 mg by mouth daily.   predniSONE 10 MG tablet Commonly known as:  DELTASONE   quinapril 20 MG tablet Commonly known as:  ACCUPRIL Take 20 mg by mouth 2 (two) times daily.   RED YEAST RICE PO Take by mouth 2 (two) times daily.   Saw Palmetto 160 MG Caps Take by mouth.   triamcinolone cream 0.1 % Commonly known as:  KENALOG APP EXT AA BID   vitamin C 1000 MG tablet Take 1,000 mg by mouth daily.   Vitamin D3 2000 units Tabs Take 1 capsule by mouth daily.       Allergies:  Allergies  Allergen Reactions  . Bee Venom Anaphylaxis  . Crestor [Rosuvastatin] Other (See Comments)    "muscle weakness and discomfort"  . Vytorin [Ezetimibe-Simvastatin] Other (See Comments)    "muscle weakness and discomfort"  . Dynapen [Dicloxacillin] Rash    Family History: Family History  Problem Relation Age of Onset  . Cancer Mother 98       colon/resection    Social History:  reports that he has never smoked. He has never used smokeless tobacco. He reports that he does not drink alcohol or use drugs.  ROS: UROLOGY Frequent Urination?: No Hard to  postpone urination?: No Burning/pain with urination?: No Get up at night to urinate?: No Leakage of urine?: No Urine stream starts and stops?: No Trouble starting stream?: No Do you have to strain to urinate?: No Blood in urine?: No Urinary tract infection?: No Sexually transmitted disease?: No Injury to kidneys or bladder?: No Painful intercourse?: No Weak stream?: No Erection problems?: Yes Penile pain?: No  Gastrointestinal Nausea?: No Vomiting?: No Indigestion/heartburn?: No Diarrhea?: No Constipation?: Yes  Constitutional Fever: No Night sweats?: No Weight loss?: No Fatigue?: No  Skin Skin rash/lesions?: No Itching?: No  Eyes Blurred vision?: No Double vision?: No  Ears/Nose/Throat Sore throat?: No Sinus problems?: No  Hematologic/Lymphatic Swollen glands?: No Easy bruising?: No  Cardiovascular Leg swelling?: No Chest pain?: No  Respiratory Cough?: No Shortness of breath?: No  Endocrine  Excessive thirst?: No  Musculoskeletal Back pain?: Yes Joint pain?: No  Neurological Headaches?: No Dizziness?: No  Psychologic Depression?: No Anxiety?: No  Physical Exam: BP (!) 148/101 (BP Location: Left Arm, Patient Position: Sitting, Cuff Size: Large)   Pulse 75   Ht 5\' 10"  (1.778 m)   Wt 230 lb 9.6 oz (104.6 kg)   BMI 33.09 kg/m   Constitutional:  Alert and oriented, No acute distress. HEENT: [place] AT, moist mucus membranes.  Trachea midline, no masses. Cardiovascular: No clubbing, cyanosis, or edema. Respiratory: Normal respiratory effort, no increased work of breathing. GI: Abdomen is soft, nontender, nondistended, no abdominal masses GU: No CVA tenderness Lymph: No cervical or inguinal lymphadenopathy. Skin: No rashes, bruises or suspicious lesions. Neurologic: Grossly intact, no focal deficits, moving all 4 extremities. Psychiatric: Normal mood and affect.   Assessment & Plan:   1.  Prostate cancer-PSA remains undetectable and will  continue monitoring.  2.  Hypogonadism-he has bothersome symptoms and desires to restart TRT.  Will add on a repeat testosterone level to the PSA drawn earlier this week.  We discussed the theoretical risk of stimulation of prostate cancer growth with testosterone replacement although research is showing TRT after prostate cancer treatment is safe.  He had previously been on AndroGel and would like to restart.  Will wait on his repeat testosterone level.  Will check a testosterone level 6 weeks after starting therapy and monitoring labs including a PSA 3 months after starting replacement.   Return in about 3 months (around 08/11/2018) for Recheck, labs.   Abbie Sons, [place] [address], [place],  [address] [phone]

## 2018-05-14 ENCOUNTER — Other Ambulatory Visit: Payer: Self-pay | Admitting: Urology

## 2018-05-14 DIAGNOSIS — E291 Testicular hypofunction: Secondary | ICD-10-CM

## 2018-05-14 MED ORDER — TESTOSTERONE 20.25 MG/ACT (1.62%) TD GEL: TRANSDERMAL | 1 refills | Status: DC

## 2018-05-17 LAB — TESTOSTERONE: TESTOSTERONE: 189 ng/dL — AB (ref 264–916)

## 2018-05-17 LAB — SPECIMEN STATUS REPORT

## 2018-07-19 ENCOUNTER — Encounter: Payer: Self-pay | Admitting: Urology

## 2018-07-21 ENCOUNTER — Other Ambulatory Visit: Payer: Self-pay | Admitting: Urology

## 2018-07-21 DIAGNOSIS — C61 Malignant neoplasm of prostate: Secondary | ICD-10-CM

## 2018-08-07 ENCOUNTER — Other Ambulatory Visit: Payer: Medicare Other

## 2018-08-07 DIAGNOSIS — C61 Malignant neoplasm of prostate: Secondary | ICD-10-CM

## 2018-08-07 DIAGNOSIS — E291 Testicular hypofunction: Secondary | ICD-10-CM

## 2018-08-08 ENCOUNTER — Other Ambulatory Visit: Payer: Medicare Other

## 2018-08-08 LAB — TESTOSTERONE: Testosterone: 328 ng/dL (ref 264–916)

## 2018-08-08 LAB — PSA

## 2018-08-10 ENCOUNTER — Ambulatory Visit (INDEPENDENT_AMBULATORY_CARE_PROVIDER_SITE_OTHER): Payer: Medicare Other | Admitting: Urology

## 2018-08-10 ENCOUNTER — Encounter: Payer: Self-pay | Admitting: Urology

## 2018-08-10 VITALS — BP 160/97 | HR 78 | Ht 70.0 in | Wt 233.6 lb

## 2018-08-10 DIAGNOSIS — N5235 Erectile dysfunction following radiation therapy: Secondary | ICD-10-CM

## 2018-08-10 DIAGNOSIS — C61 Malignant neoplasm of prostate: Secondary | ICD-10-CM

## 2018-08-10 DIAGNOSIS — E291 Testicular hypofunction: Secondary | ICD-10-CM | POA: Diagnosis not present

## 2018-08-10 NOTE — Progress Notes (Signed)
08/10/2018 2:47 PM   Jeanine Luz Ludolph 07-23-1946 [id number]  Referring provider: [name], MD [phone] [address] [facility] [place], [place] [address]  Chief Complaint  Patient presents with  . Follow-up   Urologic history: 1.  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  Hypogonadism -Started TRT July 2019  3.  Erectile dysfunction   HPI: 72 year old male presents for follow-up of prostate cancer and hypogonadism.  Refer to my prior note dated 05/11/2018.  He started AndroGel late July 2019 and has noted better energy level and better attitude.  He does have erectile dysfunction.  He is interested in shockwave therapy for his ED.  His testosterone level was significantly improved when drawn on 10/21 at 328 ng/dL.  PSA remains undetectable at <0.1.   PMH: Past Medical History:  Diagnosis Date  . Anticoagulant long-term use    ELIQUIS  . Arthritis   . ED (erectile dysfunction)   . GERD (gastroesophageal reflux disease)   . Hiatal hernia   . History of DVT of lower extremity    POST OP KNEE SURGERY  . History of transient ischemic attack (TIA)    PER PT HX  ?POSSIBLE-- PT ON ELIQUIS  . Hyperlipidemia   . Hypertension   . Hypogonadism male   . OSA (obstructive sleep apnea)    per pt positive sleep study yrs ago recommended CPAP --  pt Non- compliant -- states uses Breath Rite Nasal Strips  . Peripheral neuropathy   . Prostate cancer [facility]) urologist-  dr tannenbaum/  oncologist-- dr [name]   dx 03-25-2015  T1c, Gleason 4+3  s/p  cryoablation prostate 06-02-2015/  external  radiation therapy 08-16-2016 to 09-20-2016/  scheduled for radioactive seed implants 10-22-2016  . Type 2 diabetes mellitus ([place])     followed by dr [name] [facility])  last A1c 8.4 on 07-27-2016 per lov note  . Wears glasses   . Wears hearing aid    bilateral    Surgical History: Past Surgical History:  Procedure Laterality Date  . COLONOSCOPY WITH PROPOFOL N/A 05/30/2017   Procedure: COLONOSCOPY WITH PROPOFOL;  Surgeon: [name], MD;  Location: [facility] ENDOSCOPY;  Service: Endoscopy;  Laterality: N/A;  . CRYOABLATION N/A 06/02/2015   Procedure: CRYO ABLATION PROSTATE;  Surgeon: Carolan Clines, MD;  Location: [facility];  Service: Urology;  Laterality: N/A;  . ELBOW BURSA SURGERY Left Feb 1997  . RADIOACTIVE SEED IMPLANT N/A 10/22/2016   Procedure: RADIOACTIVE SEED IMPLANT/BRACHYTHERAPY IMPLANT, URETHRAL STRICTURE DIALIATION;  Surgeon: Carolan Clines, MD;  Location: [place];  Service: Urology;  Laterality: N/A;  . SHOULDER OPEN ROTATOR CUFF REPAIR Left 09-04-2014  . TOTAL KNEE ARTHROPLASTY Bilateral right 08-13-2006  &  left 11-14-2008  . UMBILICAL HERNIA REPAIR  06-23-2012    Home Medications:  Allergies as of 08/10/2018      Reactions   Bee Venom Anaphylaxis   Crestor [rosuvastatin] Other (See Comments)   "muscle weakness and discomfort"   Vytorin [ezetimibe-simvastatin] Other (See Comments)   "muscle weakness and discomfort"   Dynapen [dicloxacillin] Rash      Medication List        Accurate as of 08/10/18  2:47 PM. Always use your most recent med list.  ACCU-CHEK FASTCLIX LANCETS Misc   aspirin-acetaminophen-caffeine 366-440-34 MG tablet Commonly known as:  EXCEDRIN MIGRAINE Take by mouth every 6 (six) hours as needed for headache.   bisoprolol-hydrochlorothiazide 10-6.25 MG tablet Commonly known as:  ZIAC Take 1 tablet by mouth every morning.   cetirizine 10 MG tablet Commonly known as:  ZYRTEC   clindamycin 300 MG capsule Commonly known as:  CLEOCIN TK ONE C PO  QID AC  AND HS   ELIQUIS 5 MG Tabs tablet Generic  drug:  apixaban take 1 tablet by mouth twice a day   EPINEPHrine 0.3 mg/0.3 mL Soaj injection Commonly known as:  EPI-PEN Inject into the muscle as needed.   esomeprazole 40 MG capsule Commonly known as:  NEXIUM Take 40 mg by mouth every evening.   fluticasone 50 MCG/ACT nasal spray Commonly known as:  FLONASE SHAKE LQ AND U 1 SPR IEN BID   glimepiride 2 MG tablet Commonly known as:  AMARYL   glucose blood test strip TEST TWICE DAILY AS INSTRUCTED   ibuprofen 200 MG tablet Commonly known as:  ADVIL,MOTRIN Take 200 mg by mouth every 6 (six) hours as needed.   metFORMIN 500 MG 24 hr tablet Commonly known as:  GLUCOPHAGE-XR take 1 tablet by mouth twice a day with meals   mupirocin ointment 2 % Commonly known as:  BACTROBAN APP EXT AA TID   ONGLYZA 5 MG Tabs tablet Generic drug:  saxagliptin HCl Take 5 mg by mouth daily.   predniSONE 10 MG tablet Commonly known as:  DELTASONE   quinapril 20 MG tablet Commonly known as:  ACCUPRIL Take 20 mg by mouth 2 (two) times daily.   RED YEAST RICE PO Take by mouth 2 (two) times daily.   Saw Palmetto 160 MG Caps Take by mouth.   Testosterone 20.25 MG/ACT (1.62%) Gel Apply 1 pump to each shoulder daily   triamcinolone cream 0.1 % Commonly known as:  KENALOG APP EXT AA BID   vitamin C 1000 MG tablet Take 1,000 mg by mouth daily.   Vitamin D3 2000 units Tabs Take 1 capsule by mouth daily.       Allergies:  Allergies  Allergen Reactions  . Bee Venom Anaphylaxis  . Crestor [Rosuvastatin] Other (See Comments)    "muscle weakness and discomfort"  . Vytorin [Ezetimibe-Simvastatin] Other (See Comments)    "muscle weakness and discomfort"  . Dynapen [Dicloxacillin] Rash    Family History: Family History  Problem Relation Age of Onset  . Cancer Mother 44       colon/resection    Social History:  reports that he has never smoked. He has never used smokeless tobacco. He reports that he does not drink alcohol or  use drugs.  ROS: UROLOGY Frequent Urination?: No Hard to postpone urination?: No Burning/pain with urination?: No Get up at night to urinate?: No Leakage of urine?: Yes Urine stream starts and stops?: No Trouble starting stream?: No Do you have to strain to urinate?: No Blood in urine?: No Urinary tract infection?: No Sexually transmitted disease?: No Injury to kidneys or bladder?: No Painful intercourse?: No Weak stream?: No Erection problems?: Yes Penile pain?: No  Gastrointestinal Nausea?: No Vomiting?: No Indigestion/heartburn?: No Diarrhea?: No Constipation?: Yes  Constitutional Fever: No Night sweats?: No Weight loss?: No Fatigue?: No  Skin Skin rash/lesions?: No Itching?: No  Eyes Blurred vision?: No Double vision?: No  Ears/Nose/Throat Sore throat?: No Sinus problems?: No  Hematologic/Lymphatic Swollen glands?: No Easy bruising?: No  Cardiovascular Leg  swelling?: No Chest pain?: No  Respiratory Cough?: No Shortness of breath?: No  Endocrine Excessive thirst?: No  Musculoskeletal Back pain?: Yes Joint pain?: No  Neurological Headaches?: No Dizziness?: No  Psychologic Depression?: No Anxiety?: No  Physical Exam: BP (!) 160/97 (BP Location: Left Arm, Patient Position: Sitting, Cuff Size: Large)   Pulse 78   Ht 5\' 10"  (1.778 m)   Wt 233 lb 9.6 oz (106 kg)   BMI 33.52 kg/m   Constitutional:  Alert and oriented, No acute distress. HEENT: [place] AT, moist mucus membranes.  Trachea midline. Cardiovascular: No clubbing, cyanosis, or edema. Respiratory: Normal respiratory effort, no increased work of breathing. Skin: No rashes, bruises or suspicious lesions. Neurologic: Grossly intact, no focal deficits, moving all 4 extremities. Psychiatric: Normal mood and affect.   Assessment & Plan:   72 year old male with prostate cancer, hypogonadism and erectile dysfunction.  His PSA remains undetectable after starting TRT.  His energy level  and mood has significantly improved.  His testosterone level was 328 and he can increase to 3 pumps of AndroGel daily.  Recheck blood work 3 months.  I discussed other options for his ED including intracavernosal injections.  I am not aware of any practices in the area currently doing shockwave therapy for ED and he indicated he wanted to investigate this further.  Return in about 3 months (around 11/10/2018) for Recheck, lab visit prior PSA/testosterone/hematocrit.  Greater than 50% of this 15-minute visit was spent counseling the patient.   Abbie Sons, [place] [address], [place], [place] [address] [phone]

## 2018-10-05 ENCOUNTER — Telehealth: Payer: Self-pay | Admitting: Urology

## 2018-10-05 ENCOUNTER — Other Ambulatory Visit: Payer: Medicare Other

## 2018-10-05 DIAGNOSIS — R3915 Urgency of urination: Secondary | ICD-10-CM

## 2018-10-05 LAB — MICROSCOPIC EXAMINATION: Epithelial Cells (non renal): NONE SEEN /hpf (ref 0–10)

## 2018-10-05 LAB — URINALYSIS, COMPLETE
Bilirubin, UA: NEGATIVE
Glucose, UA: NEGATIVE
Ketones, UA: NEGATIVE
Leukocytes, UA: NEGATIVE
Nitrite, UA: POSITIVE — AB
Specific Gravity, UA: 1.025 (ref 1.005–1.030)
Urobilinogen, Ur: 0.2 mg/dL (ref 0.2–1.0)
pH, UA: 5.5 (ref 5.0–7.5)

## 2018-10-05 MED ORDER — SULFAMETHOXAZOLE-TRIMETHOPRIM 800-160 MG PO TABS: 1.0000 | ORAL_TABLET | Freq: Two times a day (BID) | ORAL | 0 refills | Status: DC

## 2018-10-05 NOTE — Telephone Encounter (Signed)
Pt called stating his " pee schedule is off and not normal" pt also states his fever has been flucutating between 99.9 - 100, having frequency and urgency, pt denies any bladder or flank pain, pt insisted to come in to leave urine sample to rule out a possible UTI. Added pt to lab schedule, please enter orders. Thanks

## 2018-10-09 ENCOUNTER — Telehealth: Payer: Self-pay | Admitting: Urology

## 2018-10-09 LAB — CULTURE, URINE COMPREHENSIVE

## 2018-10-09 MED ORDER — CEFUROXIME AXETIL 500 MG PO TABS: 500.0000 mg | ORAL_TABLET | Freq: Two times a day (BID) | ORAL | 0 refills | Status: DC

## 2018-10-09 NOTE — Telephone Encounter (Signed)
Spoke with pt and he agreed to taking new medication. He is aware to stop what was originally prescribed on the day he dropped off his urine sample. Rx was sent in to pharmacy of choice.  Stoioff, Ronda Fairly, MD  [facility]        Urine culture was positive for infection. Please send in Rx cefuroxime 500 mg twice daily for 10 days     Dr. Bernardo Heater gave ok to prescribe with low contra to dynapen

## 2018-11-02 ENCOUNTER — Other Ambulatory Visit: Payer: Medicare Other

## 2018-11-02 ENCOUNTER — Other Ambulatory Visit: Payer: Self-pay

## 2018-11-02 DIAGNOSIS — C61 Malignant neoplasm of prostate: Secondary | ICD-10-CM

## 2018-11-02 DIAGNOSIS — E291 Testicular hypofunction: Secondary | ICD-10-CM

## 2018-11-04 LAB — TESTOSTERONE: Testosterone: 275 ng/dL (ref 264–916)

## 2018-11-04 LAB — PSA: Prostate Specific Ag, Serum: <0.1 ng/mL (ref 0.0–4.0)

## 2018-11-04 LAB — HEMATOCRIT: Hematocrit: 43.7 % (ref 37.5–51.0)

## 2018-11-07 ENCOUNTER — Ambulatory Visit: Payer: Medicare Other | Admitting: Urology

## 2018-11-07 ENCOUNTER — Encounter: Payer: Self-pay | Admitting: Urology

## 2018-11-07 VITALS — BP 159/99 | HR 81 | Ht 69.0 in | Wt 229.0 lb

## 2018-11-07 DIAGNOSIS — C61 Malignant neoplasm of prostate: Secondary | ICD-10-CM

## 2018-11-07 DIAGNOSIS — N5235 Erectile dysfunction following radiation therapy: Secondary | ICD-10-CM

## 2018-11-07 DIAGNOSIS — E291 Testicular hypofunction: Secondary | ICD-10-CM

## 2018-11-07 LAB — URINALYSIS, COMPLETE
Bilirubin, UA: NEGATIVE
Ketones, UA: NEGATIVE
Leukocytes, UA: NEGATIVE
Nitrite, UA: NEGATIVE
Protein, UA: NEGATIVE
RBC, UA: NEGATIVE
Specific Gravity, UA: 1.02 (ref 1.005–1.030)
Urobilinogen, Ur: 0.2 mg/dL (ref 0.2–1.0)
pH, UA: 5.5 (ref 5.0–7.5)

## 2018-11-07 LAB — MICROSCOPIC EXAMINATION
Bacteria, UA: NONE SEEN
Epithelial Cells (non renal): NONE SEEN /hpf (ref 0–10)
RBC, UA: NONE SEEN /hpf (ref 0–2)

## 2018-11-07 NOTE — Progress Notes (Signed)
11/07/2018 8:17 AM   Gillermina Phy September 22, 1946 [id number]  Referring provider: [name], MD [phone] [address] [facility] [place], [place] [address]  Chief Complaint  Patient presents with  . Prostate Cancer    3 month follow up   Urologic history: 1.  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  Hypogonadism -Started TRT July 2019  3.  Erectile dysfunction  HPI: 73 year old male presents for a six-month follow-up.  He did call in late December 2019 complaining of low-grade fever and urinary frequency, urgency.  Urinalysis did show pyuria and he was started on Septra however his urine culture grew E. coli which was resistant to this medication he was subsequently switch to cefuroxime with resolution of his symptoms.  He remains on testosterone gel 2 pumps daily at 1.62%.  He does note slight decreased energy.  He had labs drawn on 1/16 and his testosterone level was low normal at 275 ng/dL.  PSA was <0.1 and hematocrit was normal.  He is also interested in treatment for ED.  He has a partial erection which he estimates at 5-10% normal.  He has tried PDE 5 inhibitors in the past which have not been effective.  He was particularly interested in shockwave therapy for ED.   PMH: Past Medical History:  Diagnosis Date  . Anticoagulant long-term use    ELIQUIS  . Arthritis   . ED (erectile dysfunction)   . GERD (gastroesophageal reflux disease)   . Hiatal hernia   . History of DVT of lower extremity    POST OP KNEE SURGERY  . History of transient ischemic attack (TIA)    PER PT HX  ?POSSIBLE-- PT ON ELIQUIS  . Hyperlipidemia   . Hypertension   . Hypogonadism male   . OSA (obstructive sleep apnea)    per pt positive sleep study yrs ago recommended CPAP --  pt Non- compliant -- states uses Breath Rite Nasal Strips  . Peripheral neuropathy   . Prostate cancer [facility]) urologist-  dr tannenbaum/  oncologist-- dr [name]   dx 03-25-2015  T1c, Gleason 4+3  s/p  cryoablation prostate 06-02-2015/  external  radiation therapy 08-16-2016 to 09-20-2016/  scheduled for radioactive seed implants 10-22-2016  . Type 2 diabetes mellitus ([place])    followed by dr [name] [facility] clinic)  last A1c 8.4 on 07-27-2016 per lov note  . Wears glasses   . Wears hearing aid    bilateral    Surgical History: Past Surgical History:  Procedure Laterality Date  . COLONOSCOPY WITH PROPOFOL N/A 05/30/2017   Procedure: COLONOSCOPY WITH PROPOFOL;  Surgeon: [name], MD;  Location: [facility] END[facility];  Service: Endoscopy;  Laterality: N/A;  . CRYOABLATION N/A 06/02/2015   Procedure: CRYO ABLATION PROSTATE;  Surgeon: Carolan Clines, MD;  Location: [facility];  Service: Urology;  Laterality: N/A;  . ELBOW BURSA SURGERY Left Feb 1997  . RADIOACTIVE SEED IMPLANT N/A 10/22/2016   Procedure: RADIOACTIVE SEED IMPLANT/BRACHYTHERAPY IMPLANT, URETHRAL STRICTURE DIALIATION;  Surgeon: Carolan Clines, MD;  Location: [facility];  Service: Urology;  Laterality: N/A;  . SHOULDER OPEN ROTATOR CUFF REPAIR Left 09-04-2014  . TOTAL KNEE ARTHROPLASTY Bilateral right 08-13-2006  &  left 11-14-2008  . UMBILICAL HERNIA REPAIR  06-23-2012  Home Medications:  Allergies as of 11/07/2018      Reactions   Bee Venom Anaphylaxis   Crestor [rosuvastatin] Other (See Comments)   "muscle weakness and discomfort"   Vytorin [ezetimibe-simvastatin] Other (See Comments)   "muscle weakness and discomfort"   Dynapen [dicloxacillin] Rash      Medication List       Accurate as of November 07, 2018 11:59 PM. Always use your most recent med list.        ACCU-CHEK FASTCLIX LANCETS Misc     aspirin-acetaminophen-caffeine 250-250-65 MG tablet Commonly known as:  EXCEDRIN MIGRAINE Take by mouth every 6 (six) hours as needed for headache.   bisoprolol-hydrochlorothiazide 10-6.25 MG tablet Commonly known as:  ZIAC Take 1 tablet by mouth every morning.   cefUROXime 500 MG tablet Commonly known as:  CEFTIN Take 1 tablet (500 mg total) by mouth 2 (two) times daily with a meal.   cetirizine 10 MG tablet Commonly known as:  ZYRTEC   clindamycin 300 MG capsule Commonly known as:  CLEOCIN TK ONE C PO  QID AC  AND HS   ELIQUIS 5 MG Tabs tablet Generic drug:  apixaban take 1 tablet by mouth twice a day   EPINEPHrine 0.3 mg/0.3 mL Soaj injection Commonly known as:  EPI-PEN Inject into the muscle as needed.   esomeprazole 40 MG capsule Commonly known as:  NEXIUM Take 40 mg by mouth every evening.   fluticasone 50 MCG/ACT nasal spray Commonly known as:  FLONASE SHAKE LQ AND U 1 SPR IEN BID   glimepiride 2 MG tablet Commonly known as:  AMARYL   glucose blood test strip TEST TWICE DAILY AS INSTRUCTED   ibuprofen 200 MG tablet Commonly known as:  ADVIL,MOTRIN Take 200 mg by mouth every 6 (six) hours as needed.   metFORMIN 500 MG 24 hr tablet Commonly known as:  GLUCOPHAGE-XR take 1 tablet by mouth twice a day with meals   mupirocin ointment 2 % Commonly known as:  BACTROBAN APP EXT AA TID   ONGLYZA 5 MG Tabs tablet Generic drug:  saxagliptin HCl Take 5 mg by mouth daily.   predniSONE 10 MG tablet Commonly known as:  DELTASONE   quinapril 20 MG tablet Commonly known as:  ACCUPRIL Take 20 mg by mouth 2 (two) times daily.   RED YEAST RICE PO Take by mouth 2 (two) times daily.   Saw Palmetto 160 MG Caps Take by mouth.   Testosterone 20.25 MG/ACT (1.62%) Gel Apply 1 pump to each shoulder daily   triamcinolone cream 0.1 % Commonly known as:  KENALOG APP EXT AA BID   vitamin C 1000 MG tablet Take 1,000 mg by mouth daily.   Vitamin D3 50 MCG (2000  UT) Tabs Take 1 capsule by mouth daily.       Allergies:  Allergies  Allergen Reactions  . Bee Venom Anaphylaxis  . Crestor [Rosuvastatin] Other (See Comments)    "muscle weakness and discomfort"  . Vytorin [Ezetimibe-Simvastatin] Other (See Comments)    "muscle weakness and discomfort"  . Dynapen [Dicloxacillin] Rash    Family History: Family History  Problem Relation Age of Onset  . Cancer Mother 65       colon/resection    Social History:  reports that he has never smoked. He has never used smokeless tobacco. He reports that he does not drink alcohol or use drugs.  ROS: UROLOGY Frequent Urination?: No Hard to postpone urination?: No Burning/pain with urination?: No Get up at night  to urinate?: No Leakage of urine?: No Urine stream starts and stops?: No Trouble starting stream?: No Do you have to strain to urinate?: No Blood in urine?: No Urinary tract infection?: No Sexually transmitted disease?: No Injury to kidneys or bladder?: No Painful intercourse?: No Weak stream?: No Erection problems?: Yes Penile pain?: No  Gastrointestinal Nausea?: No Vomiting?: No Indigestion/heartburn?: No Diarrhea?: No Constipation?: No  Constitutional Fever: No Night sweats?: No Weight loss?: No Fatigue?: No  Skin Skin rash/lesions?: No Itching?: No  Eyes Blurred vision?: No Double vision?: No  Ears/Nose/Throat Sore throat?: No Sinus problems?: No  Hematologic/Lymphatic Swollen glands?: No Easy bruising?: No  Cardiovascular Leg swelling?: No Chest pain?: No  Respiratory Cough?: No Shortness of breath?: No  Endocrine Excessive thirst?: No  Musculoskeletal Back pain?: No Joint pain?: No  Neurological Headaches?: No Dizziness?: No  Psychologic Depression?: No Anxiety?: No  Physical Exam: BP (!) 159/99 (BP Location: Left Arm, Patient Position: Sitting)   Pulse 81   Ht 5\' 9"  (1.753 m)   Wt 229 lb (103.9 kg)   BMI 33.82 kg/m     Constitutional:  Alert and oriented, No acute distress. HEENT: [place] AT, moist mucus membranes.  Trachea midline, no masses. Cardiovascular: No clubbing, cyanosis, or edema. Respiratory: Normal respiratory effort, no increased work of breathing. Neurologic: Grossly intact, no focal deficits, moving all 4 extremities. Psychiatric: Normal mood and affect.   Assessment & Plan:    1. Hypogonadism in male Will increase his testosterone gel to 3 pumps daily and recheck a testosterone level in 6 weeks and then semiannual follow-up  2.  Erectile dysfunction He was informed that [facility] in [place] is doing shockwave therapy for ED  3.  History of prostate cancer PSA remains undetectable.   Abbie Sons, [place] [address], [place], [place] [address] [phone]

## 2018-11-09 ENCOUNTER — Encounter: Payer: Self-pay | Admitting: Urology

## 2018-11-16 DIAGNOSIS — K648 Other hemorrhoids: Secondary | ICD-10-CM | POA: Insufficient documentation

## 2018-12-21 ENCOUNTER — Other Ambulatory Visit: Payer: Medicare Other

## 2018-12-21 DIAGNOSIS — E291 Testicular hypofunction: Secondary | ICD-10-CM

## 2018-12-22 LAB — TESTOSTERONE: Testosterone: 844 ng/dL (ref 264–916)

## 2018-12-24 ENCOUNTER — Encounter: Payer: Self-pay | Admitting: Urology

## 2018-12-28 DIAGNOSIS — M791 Myalgia, unspecified site: Secondary | ICD-10-CM | POA: Insufficient documentation

## 2018-12-29 ENCOUNTER — Other Ambulatory Visit (INDEPENDENT_AMBULATORY_CARE_PROVIDER_SITE_OTHER): Payer: Self-pay | Admitting: Vascular Surgery

## 2019-01-03 NOTE — Telephone Encounter (Signed)
It doesn't look like we have seen this patient since 2016 and the only records I found were in [employer].

## 2019-01-03 NOTE — Telephone Encounter (Signed)
No refill. Im not entirely sure why he is on eliquis based on the notes. He should refer to pcp

## 2019-01-05 ENCOUNTER — Other Ambulatory Visit: Payer: Self-pay | Admitting: Family Medicine

## 2019-01-07 MED ORDER — TESTOSTERONE 20.25 MG/ACT (1.62%) TD GEL: TRANSDERMAL | 3 refills | Status: DC

## 2019-03-27 ENCOUNTER — Telehealth: Payer: Medicare Other | Admitting: Physician Assistant

## 2019-03-27 DIAGNOSIS — R399 Unspecified symptoms and signs involving the genitourinary system: Secondary | ICD-10-CM

## 2019-03-27 NOTE — Progress Notes (Signed)
Based on what you shared with me, I feel your condition warrants further evaluation and I recommend that you be seen for a face to face office visit.     NOTE: If you entered your credit card information for this eVisit, you will not be charged. You may see a "hold" on your card for the $35 but that hold will drop off and you will not have a charge processed.  If you are having a true medical emergency please call 911.  If you need an urgent face to face visit, [facility] has four urgent care centers for your convenience.      [email or web address] to reserve your spot online an avoid wait times  [facility] [address], [address] [place], [place] [address] Modified hours of operation: Monday-Friday, 12 PM to 6 PM  Saturday & Sunday 10 AM to 4 PM *Across the street from [employer]  [facility] (New Address!) [address], [address] [place], [place] [address] *Just off [address], across the road from [employer]* Modified hours of operation: Monday-Friday, 12 PM to 6 PM  Closed Saturday & Sunday   The following sites will take your insurance:  . [facility]  [phone] Get Driving Directions Find a Provider at this Location  [address] [place], [place] [address] . 10 am to 8 pm Monday-Friday . 12 pm to 8 pm Saturday-Sunday   . [facility]  [phone] Get Driving Directions Find a Provider at this Location  [address], [address] [place], [place] [address] . 8 am to 8 pm Monday-Friday . 9 am to 6 pm Saturday . 11 am to 6 pm Sunday   .  [facility]  [phone] Get Driving Directions  [address] [place], [place] [address] . 8 am to 8 pm Monday-Friday . 8 am to 4 pm Saturday-Sunday   Your e-visit answers were reviewed by a board certified advanced clinical practitioner to complete your personal care  plan.  Thank you for using e-Visits.  

## 2019-03-27 NOTE — Progress Notes (Signed)
Based on what you shared with me, I feel your condition warrants further evaluation and I recommend that you be seen for a face to face office visit.     NOTE: If you entered your credit card information for this eVisit, you will not be charged. You may see a "hold" on your card for the $35 but that hold will drop off and you will not have a charge processed.  If you are having a true medical emergency please call 911.  If you need an urgent face to face visit, [facility] has four urgent care centers for your convenience.    ?  [email or web address] to reserve your spot online an avoid wait times  [facility] [address], [address] [place], [place] [address] Modified hours of operation: Monday-Friday, 12 PM to 6 PM  Saturday & Sunday 10 AM to 4 PM *Across the street from [facility] (New Address!) [address], [place], [place] [address] *Just off [address], across the road from [place] hours of operation: Monday-Friday, 12 PM to 6 PM  Closed Saturday & Sunday   The following sites will take your insurance:  [facility] a Provider at this Location  [place], [place] [address] 10 am to 8 pm Monday-Friday 12 pm to 8 pm [facility] [place] a Provider at this Location  [facility] [place], [place], [place] [address] 8 am to 8 pm Monday-Friday 9 am to 6 pm Saturday 11 am to 6 pm Sunday   [facility]  [phone] Get Driving Directions  [address] [place], [place] [address] 8 am to 8 pm Monday-Friday 8 am to 4 pm Saturday-Sunday   Your e-visit answers were reviewed by a board certified advanced clinical practitioner to complete your personal care plan.  Thank you for  using e-Visits.  ===View-only below this line===   ----- Message -----    From: Jeremiah Zhang    Sent: 03/27/2019  1:28 PM EDT      To: E-Visit Mailing List Subject: E-Visit Submission: Urinary Problems  E-Visit Submission: Urinary Problems --------------------------------  Question: Which of the following are you experiencing? Answer:   Difficulty passing urine  Question: Are you able to pass urine? Answer:   Yes, I can pass urine with difficulty  Question: How long have you had pain or difficulty passing urine? Answer:   More than one week  Question: Do you have a fever? Answer:   No, I do not have a fever  Question: Do you have any of the following? Answer:   I have none of these problems  Question: Do you have an exaggerated sensation of the need to pass urine? Answer:   No, the sensation is normal  Question: Do you have the urge to urinate more of less frequently than normal? Answer:   The same as normal  Question: What does your urine look like? Answer:   It is cloudy  Question: Do you have any of the following? Answer:   None of the above  Question: Do you have any of the following? Answer:   No discharge  Question: Do you have any sores on your genitals? Answer:   No  Question: Do you have any history of kidney dysfunction or kidney problems? Answer:  No  Question: Within the past 3 months, have you had any surgery on your kidneys or bladder, or have you had a tube inserted to collect your urine? Answer:   No, I have never had either  Question: Have you had similar symptoms in the past? Answer:   No, I have never had  these symptoms  Question: Please list any additional comments  Answer:   Dr. Chari Manning, - I USED to have a "good stream," no issues... except ED. History includes prostate cancer, last several PSAs less than 0.1.  --  Have most recently undergone "Wave Tech Therapy" for the ED. A "complex" emotional & physical issue to have an erection.  After 4 of 7 treatments, stream started reducing and has reduced to a "trickle" toward the end. Stream -> "brook" -> now ends with a slow, small trickle.            - Need an exam? Need a PSA test? What???  Question: Please list your medication allergies that you may have ? (If 'none' , please list as 'none') Answer:   llergic to:             Dynapen (Dicloxacillin) (rash, 1993?, [employer])            Vitorin and Crestor (muscle discomfort and weakness)            Bee Sting  ( to the ER 04/11/2012 by EMS )

## 2019-04-25 ENCOUNTER — Other Ambulatory Visit: Payer: Self-pay | Admitting: Family Medicine

## 2019-04-25 DIAGNOSIS — C61 Malignant neoplasm of prostate: Secondary | ICD-10-CM

## 2019-04-25 DIAGNOSIS — E291 Testicular hypofunction: Secondary | ICD-10-CM

## 2019-04-26 ENCOUNTER — Other Ambulatory Visit: Payer: Self-pay

## 2019-04-26 ENCOUNTER — Other Ambulatory Visit: Payer: Medicare Other

## 2019-04-26 DIAGNOSIS — C61 Malignant neoplasm of prostate: Secondary | ICD-10-CM

## 2019-04-26 DIAGNOSIS — E291 Testicular hypofunction: Secondary | ICD-10-CM

## 2019-04-27 LAB — TESTOSTERONE: Testosterone: 427 ng/dL (ref 264–916)

## 2019-04-27 LAB — HEMATOCRIT: Hematocrit: 44.5 % (ref 37.5–51.0)

## 2019-04-27 LAB — PSA: Prostate Specific Ag, Serum: <0.1 ng/mL (ref 0.0–4.0)

## 2019-04-30 ENCOUNTER — Other Ambulatory Visit: Payer: Self-pay | Admitting: Urology

## 2019-04-30 ENCOUNTER — Telehealth: Payer: Self-pay

## 2019-04-30 NOTE — Telephone Encounter (Signed)
mychart notification sent 

## 2019-04-30 NOTE — Telephone Encounter (Signed)
-----   Message from Abbie Sons, MD sent at 04/30/2019  7:19 AM EDT ----- Testosterone looks good at 427. Hematocrit normal at 44.5.  PSA was <0.1.  Follow-up as scheduled

## 2019-05-01 ENCOUNTER — Other Ambulatory Visit (HOSPITAL_COMMUNITY)
Admission: RE | Admit: 2019-05-01 | Discharge: 2019-05-01 | Disposition: A | Discharge status: Home / Self Care | Payer: Medicare Other | Source: Ambulatory Visit | Attending: Urology | Admitting: Urology

## 2019-05-01 DIAGNOSIS — Z1159 Encounter for screening for other viral diseases: Secondary | ICD-10-CM | POA: Diagnosis present

## 2019-05-02 ENCOUNTER — Encounter (HOSPITAL_BASED_OUTPATIENT_CLINIC_OR_DEPARTMENT_OTHER): Payer: Self-pay | Admitting: *Deleted

## 2019-05-02 ENCOUNTER — Other Ambulatory Visit: Payer: Self-pay

## 2019-05-02 HISTORY — PX: OTHER SURGICAL HISTORY: SHX169

## 2019-05-02 LAB — SARS CORONAVIRUS 2 (TAT 6-24 HRS): SARS Coronavirus 2: NEGATIVE

## 2019-05-02 NOTE — Progress Notes (Signed)
Spoke with patient via telephone for pre op interview. NPO after MN. Patient to take Nexium with a sip of water AM of surgery. Patient verbalized understanding that he would have to wear a mask. Arrival time 63.

## 2019-05-02 NOTE — Progress Notes (Signed)
Patient states he did wear a mask for a while, but does not believe he has sleep apnea so he has not worn the mask for a while.

## 2019-05-04 ENCOUNTER — Ambulatory Visit (HOSPITAL_BASED_OUTPATIENT_CLINIC_OR_DEPARTMENT_OTHER): Payer: Medicare Other | Admitting: Anesthesiology

## 2019-05-04 ENCOUNTER — Encounter (HOSPITAL_BASED_OUTPATIENT_CLINIC_OR_DEPARTMENT_OTHER): Payer: Self-pay | Admitting: *Deleted

## 2019-05-04 ENCOUNTER — Other Ambulatory Visit: Payer: Self-pay

## 2019-05-04 ENCOUNTER — Ambulatory Visit (HOSPITAL_BASED_OUTPATIENT_CLINIC_OR_DEPARTMENT_OTHER)
Admission: RE | Admit: 2019-05-04 | Discharge: 2019-05-04 | Disposition: A | Discharge status: Home / Self Care | Payer: Medicare Other | Attending: Urology | Admitting: Urology

## 2019-05-04 ENCOUNTER — Encounter (HOSPITAL_BASED_OUTPATIENT_CLINIC_OR_DEPARTMENT_OTHER)
Admission: RE | Disposition: A | Discharge status: Home / Self Care | Payer: Self-pay | Source: Home / Self Care | Attending: Urology

## 2019-05-04 DIAGNOSIS — Z8546 Personal history of malignant neoplasm of prostate: Secondary | ICD-10-CM | POA: Diagnosis not present

## 2019-05-04 DIAGNOSIS — E291 Testicular hypofunction: Secondary | ICD-10-CM | POA: Diagnosis not present

## 2019-05-04 DIAGNOSIS — Z96653 Presence of artificial knee joint, bilateral: Secondary | ICD-10-CM | POA: Diagnosis not present

## 2019-05-04 DIAGNOSIS — Z91030 Bee allergy status: Secondary | ICD-10-CM | POA: Diagnosis not present

## 2019-05-04 DIAGNOSIS — K219 Gastro-esophageal reflux disease without esophagitis: Secondary | ICD-10-CM | POA: Insufficient documentation

## 2019-05-04 DIAGNOSIS — K449 Diaphragmatic hernia without obstruction or gangrene: Secondary | ICD-10-CM | POA: Diagnosis not present

## 2019-05-04 DIAGNOSIS — Z6833 Body mass index (BMI) 33.0-33.9, adult: Secondary | ICD-10-CM | POA: Diagnosis not present

## 2019-05-04 DIAGNOSIS — E1140 Type 2 diabetes mellitus with diabetic neuropathy, unspecified: Secondary | ICD-10-CM | POA: Insufficient documentation

## 2019-05-04 DIAGNOSIS — Z7901 Long term (current) use of anticoagulants: Secondary | ICD-10-CM | POA: Insufficient documentation

## 2019-05-04 DIAGNOSIS — E785 Hyperlipidemia, unspecified: Secondary | ICD-10-CM | POA: Insufficient documentation

## 2019-05-04 DIAGNOSIS — Z8673 Personal history of transient ischemic attack (TIA), and cerebral infarction without residual deficits: Secondary | ICD-10-CM | POA: Diagnosis not present

## 2019-05-04 DIAGNOSIS — M1990 Unspecified osteoarthritis, unspecified site: Secondary | ICD-10-CM | POA: Insufficient documentation

## 2019-05-04 DIAGNOSIS — G4733 Obstructive sleep apnea (adult) (pediatric): Secondary | ICD-10-CM | POA: Insufficient documentation

## 2019-05-04 DIAGNOSIS — I10 Essential (primary) hypertension: Secondary | ICD-10-CM | POA: Diagnosis not present

## 2019-05-04 DIAGNOSIS — Z880 Allergy status to penicillin: Secondary | ICD-10-CM | POA: Diagnosis not present

## 2019-05-04 DIAGNOSIS — E669 Obesity, unspecified: Secondary | ICD-10-CM | POA: Insufficient documentation

## 2019-05-04 DIAGNOSIS — Z888 Allergy status to other drugs, medicaments and biological substances status: Secondary | ICD-10-CM | POA: Insufficient documentation

## 2019-05-04 DIAGNOSIS — Z800 Family history of malignant neoplasm of digestive organs: Secondary | ICD-10-CM | POA: Diagnosis not present

## 2019-05-04 DIAGNOSIS — H9193 Unspecified hearing loss, bilateral: Secondary | ICD-10-CM | POA: Diagnosis not present

## 2019-05-04 DIAGNOSIS — N35919 Unspecified urethral stricture, male, unspecified site: Secondary | ICD-10-CM | POA: Diagnosis present

## 2019-05-04 DIAGNOSIS — Z86718 Personal history of other venous thrombosis and embolism: Secondary | ICD-10-CM | POA: Insufficient documentation

## 2019-05-04 DIAGNOSIS — Z9221 Personal history of antineoplastic chemotherapy: Secondary | ICD-10-CM | POA: Insufficient documentation

## 2019-05-04 DIAGNOSIS — N35912 Unspecified bulbous urethral stricture, male: Secondary | ICD-10-CM | POA: Diagnosis not present

## 2019-05-04 HISTORY — PX: CYSTOSCOPY WITH URETHRAL DILATATION: SHX5125

## 2019-05-04 LAB — POCT I-STAT 4, (NA,K, GLUC, HGB,HCT)
Glucose, Bld: 174 mg/dL — ABNORMAL HIGH (ref 70–99)
HCT: 42 % (ref 39.0–52.0)
Hemoglobin: 14.3 g/dL (ref 13.0–17.0)
Potassium: 4 mmol/L (ref 3.5–5.1)
Sodium: 142 mmol/L (ref 135–145)

## 2019-05-04 LAB — GLUCOSE, CAPILLARY: Glucose-Capillary: 143 mg/dL — ABNORMAL HIGH (ref 70–99)

## 2019-05-04 SURGERY — CYSTOSCOPY, WITH URETHRAL DILATION
Anesthesia: General | Site: Urethra

## 2019-05-04 MED ORDER — FENTANYL CITRATE (PF) 100 MCG/2ML IJ SOLN
INTRAMUSCULAR | Status: AC
  Filled 2019-05-04: qty 2

## 2019-05-04 MED ORDER — ONDANSETRON HCL 4 MG/2ML IJ SOLN
INTRAMUSCULAR | Status: DC | PRN
  Administered 2019-05-04: 4 mg via INTRAVENOUS

## 2019-05-04 MED ORDER — SENNOSIDES-DOCUSATE SODIUM 8.6-50 MG PO TABS: 1.0000 | ORAL_TABLET | Freq: Two times a day (BID) | ORAL | 0 refills | Status: DC

## 2019-05-04 MED ORDER — CEFAZOLIN SODIUM-DEXTROSE 2-3 GM-%(50ML) IV SOLR
INTRAVENOUS | Status: DC | PRN
  Administered 2019-05-04: 2 g via INTRAVENOUS

## 2019-05-04 MED ORDER — LACTATED RINGERS IV SOLN
INTRAVENOUS | Status: DC
  Administered 2019-05-04: 09:00:00 via INTRAVENOUS
  Filled 2019-05-04: qty 1000

## 2019-05-04 MED ORDER — SODIUM CHLORIDE 0.9 % IR SOLN
Status: DC | PRN
  Administered 2019-05-04: 3000 mL via INTRAVESICAL

## 2019-05-04 MED ORDER — LIDOCAINE 2% (20 MG/ML) 5 ML SYRINGE
INTRAMUSCULAR | Status: AC
  Filled 2019-05-04: qty 5

## 2019-05-04 MED ORDER — PROPOFOL 10 MG/ML IV BOLUS
INTRAVENOUS | Status: DC | PRN
  Administered 2019-05-04: 150 mg via INTRAVENOUS

## 2019-05-04 MED ORDER — ONDANSETRON HCL 4 MG/2ML IJ SOLN
INTRAMUSCULAR | Status: AC
  Filled 2019-05-04: qty 2

## 2019-05-04 MED ORDER — CIPROFLOXACIN HCL 250 MG PO TABS: 250.0000 mg | ORAL_TABLET | Freq: Two times a day (BID) | ORAL | 0 refills | Status: DC

## 2019-05-04 MED ORDER — PROPOFOL 10 MG/ML IV BOLUS
INTRAVENOUS | Status: AC
  Filled 2019-05-04: qty 40

## 2019-05-04 MED ORDER — OXYCODONE-ACETAMINOPHEN 5-325 MG PO TABS: 1.0000 | ORAL_TABLET | ORAL | 0 refills | Status: DC | PRN

## 2019-05-04 MED ORDER — CEFAZOLIN SODIUM 1 G IJ SOLR
INTRAMUSCULAR | Status: AC
  Filled 2019-05-04: qty 20

## 2019-05-04 MED ORDER — IOHEXOL 300 MG/ML  SOLN
INTRAMUSCULAR | Status: DC | PRN
  Administered 2019-05-04: 15 mL

## 2019-05-04 MED ORDER — LIDOCAINE 2% (20 MG/ML) 5 ML SYRINGE
INTRAMUSCULAR | Status: DC | PRN
  Administered 2019-05-04: 100 mg via INTRAVENOUS

## 2019-05-04 MED ORDER — FENTANYL CITRATE (PF) 100 MCG/2ML IJ SOLN
INTRAMUSCULAR | Status: DC | PRN
  Administered 2019-05-04 (×2): 50 ug via INTRAVENOUS

## 2019-05-04 SURGICAL SUPPLY — 26 items
BAG DRAIN URO-CYSTO SKYTR STRL (DRAIN) ×3 IMPLANT
BAG DRN ANRFLXCHMBR STRAP LEK (BAG) ×1
BAG DRN UROCATH (DRAIN) ×1
BAG URINE DRAINAGE (UROLOGICAL SUPPLIES) IMPLANT
BAG URINE LEG 19OZ MD ST LTX (BAG) ×2 IMPLANT
CATH FOLEY 2W COUNCIL 20FR 5CC (CATHETERS) ×2 IMPLANT
CATH X-FORCE N30 NEPHROSTOMY (TUBING) ×2 IMPLANT
CLOTH BEACON ORANGE TIMEOUT ST (SAFETY) ×4 IMPLANT
GLOVE BIO SURGEON STRL SZ7.5 (GLOVE) ×3 IMPLANT
GLOVE BIOGEL PI IND STRL 7.0 (GLOVE) IMPLANT
GLOVE BIOGEL PI INDICATOR 7.0 (GLOVE) ×2
GOWN STRL REUS W/ TWL LRG LVL3 (GOWN DISPOSABLE) ×3 IMPLANT
GOWN STRL REUS W/TWL LRG LVL3 (GOWN DISPOSABLE) ×6
GOWN STRL REUS W/TWL XL LVL3 (GOWN DISPOSABLE) ×2 IMPLANT
GUIDEWIRE ANG ZIPWIRE 038X150 (WIRE) ×3 IMPLANT
GUIDEWIRE STR DUAL SENSOR (WIRE) ×2 IMPLANT
HOLDER FOLEY CATH W/STRAP (MISCELLANEOUS) IMPLANT
IV NS IRRIG 3000ML ARTHROMATIC (IV SOLUTION) ×2 IMPLANT
KIT TURNOVER CYSTO (KITS) ×3 IMPLANT
MANIFOLD NEPTUNE II (INSTRUMENTS) ×2 IMPLANT
NDL HYPO 18GX1.5 BLUNT FILL (NEEDLE) IMPLANT
NEEDLE HYPO 18GX1.5 BLUNT FILL (NEEDLE) IMPLANT
PACK CYSTO (CUSTOM PROCEDURE TRAY) ×3 IMPLANT
SYR 20CC LL (SYRINGE) ×2 IMPLANT
TUBE CONNECTING 12'X1/4 (SUCTIONS) ×1
TUBE CONNECTING 12X1/4 (SUCTIONS) ×1 IMPLANT

## 2019-05-04 NOTE — Anesthesia Preprocedure Evaluation (Addendum)
Anesthesia Evaluation  Patient identified by MRN, date of birth, ID band Patient awake    Reviewed: Allergy & Precautions, NPO status , Patient's Chart, lab work & pertinent test results  Airway Mallampati: II  TM Distance: >3 FB     Dental   Pulmonary    breath sounds clear to auscultation       Cardiovascular hypertension,  Rhythm:Regular Rate:Normal     Neuro/Psych    GI/Hepatic Neg liver ROS, hiatal hernia, GERD  ,  Endo/Other  diabetes  Renal/GU negative Renal ROS     Musculoskeletal   Abdominal   Peds  Hematology   Anesthesia Other Findings   Reproductive/Obstetrics                             Anesthesia Physical Anesthesia Plan  ASA: III  Anesthesia Plan: General   Post-op Pain Management:    Induction: Intravenous  PONV Risk Score and Plan: 2 and Ondansetron, Dexamethasone and Midazolam  Airway Management Planned: LMA  Additional Equipment:   Intra-op Plan:   Post-operative Plan: Extubation in OR  Informed Consent: I have reviewed the patients History and Physical, chart, labs and discussed the procedure including the risks, benefits and alternatives for the proposed anesthesia with the patient or authorized representative who has indicated his/her understanding and acceptance.     Dental advisory given  Plan Discussed with: CRNA and Anesthesiologist  Anesthesia Plan Comments:         Anesthesia Quick Evaluation

## 2019-05-04 NOTE — Anesthesia Postprocedure Evaluation (Signed)
Anesthesia Post Note  Patient: Jeremiah Zhang  Procedure(s) Performed: CYSTOSCOPY WITH URETHRAL DILATATION (N/A Urethra)     Patient location during evaluation: PACU Anesthesia Type: General Level of consciousness: awake Pain management: pain level controlled Vital Signs Assessment: post-procedure vital signs reviewed and stable Respiratory status: spontaneous breathing Cardiovascular status: stable Postop Assessment: no apparent nausea or vomiting Anesthetic complications: no    Last Vitals:  Vitals:   05/04/19 1100 05/04/19 1215  BP:  (!) 142/86  Pulse: 61 61  Resp: 13 16  Temp: 36.6 C   SpO2: 92% 99%    Last Pain:  Vitals:   05/04/19 1100  TempSrc: Oral  PainSc:                  Pearse Shiffler

## 2019-05-04 NOTE — Op Note (Signed)
NAME: Jeremiah Zhang, Jeremiah Zhang MEDICAL RECORD [id number] ACCOUNT [id number] DATE OF BIRTH:12/08/1945 FACILITY: [facility] LOCATION: [facility] PHYSICIAN:Corvette Orser, MD  OPERATIVE REPORT  DATE OF PROCEDURE:  05/04/2019  PREOPERATIVE DIAGNOSIS:  Urethral stricture, history of prostate cancer.  PROCEDURE:  Cystoscopy, urethral dilation and catheter placement complicated.  ESTIMATED BLOOD LOSS:  Nil.  COMPLICATIONS:  None.  SPECIMENS:  None.  FINDINGS: 1.  Short segment high-grade urethral stricture approximately 3-French predilation 30-French post-dilation. 2.  Otherwise, unremarkable urethra and urinary bladder. 3.  Successful placement of 20 [place] council catheter, 10 mL sterile in the balloon.  INDICATIONS:  The patient is an obese 73 year old diabetic male with history of prostate cancer who is status post radiation years ago.  He is variably compliant.  He has a long history of doctor shopping, seeing multiple providers throughout the state  for various complaints.  He most recently underwent a procedure for erectile dysfunction at a for-profit clinic run by a non-urology provider and despite having multiple contraindications to the procedure, he had this cash-based procedure done.  His  contraindications included cancer treatment and urethral stricture disease.  After this treatment, he wound up with complications including recurrence of urethral stricture.  He presented to [place] for management as the provider for that service has no  experience whatsoever dealing with the complications.  He underwent cystoscopy which corroborated stricture disease with impending urinary retention.  Recommend path of operative dilation was discussed and he wished to proceed.  Informed consent was  obtained and placed in medical record.  DESCRIPTION OF PROCEDURE:  The patient was identified.  The procedure being cystoscopy, urethral dilation was confirmed.  Procedure timeout was performed.  Intravenous  antibiotics were administered.  General anesthesia induced.  The patient was placed  into a low lithotomy position, sterile field was created prepping and draping the patient's penis, perineum and proximal thighs using iodine.  Cystourethroscopy was performed with a 21-French rigid cystoscope with offset lens.  Inspection of anterior and  posterior urethra revealed a high-grade short segment stricture at the area of the bulbar urethra.  Photodocumentation performed.  This is estimated to be approximately 3-French.  A 0.038 ZIPwire was very carefully navigated across this to the level of  the urinary bladder by spot fluoroscopic imaging and a 30-French balloon dilation apparatus was carefully advanced across the level of the stricture and inflated to a pressure of 25 atmospheres, held for 90 seconds and then released.  Previous cystoscopy  revealed excellent dilation of the short segment stricture.  Otherwise unremarkable prostate and urinary bladder.  To help aid in healing, a new 20-French Foley catheter was placed council tip over the working wire to the level of the urinary bladder  and 10 mL were in the balloon, placed to straight drain.  Procedure terminated.  The patient tolerated the procedure well.  No immediate perioperative complications.  The patient was taken to the postanesthesia care in stable condition with plan for  discharge home.  TN/NUANCE  D:05/04/2019 T:05/04/2019 JOB:[id number]

## 2019-05-04 NOTE — H&P (Signed)
Jeremiah Zhang is an 73 y.o. male.    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 Recurrencey by cysto 04/2019.   3 - Lower Urinary Tract Symptoms / Urge Incontinence- Significant irritative LUTS after therapy for prostate cancer above. Tried on numerous meds for spasms and lower tract (anticholinergic, alpha blockers, myrbetriq) without improvement. Using pads daily. PVR 2020 "175 mL" Cysto 04/2019 with recurrence of stricture.   4 - Erectile Dysfunction - severe ED x years. He is insuling dependant diabtic with HTN and s/p prostate cryo and radiation. For unclear reasons he has 4 treatments of penile SWL "[employer]" therapy by a non-urology MD. This therapy in contra-indicated in men with ED due to cancer treatment, on anticoaguation, and with stricutues, all of which he has.   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   CT 2017 w/o stones or hydro, but that was before brachytherapy.   PMH sig for DM2 (A21c 7-8), obesity, no ischemic CV disease, doctor shopping (has seen numerous providers all over the state for various complaints). His PCP is Jeremiah Zhang with [facility].   Today " Jeremiah Zhang " is seen to proceed with cysto and urethral dilation for recurrent urethral strictures. NO interval fevers of frank urinary retention.      Past Medical History:  Diagnosis Date  . Anticoagulant long-term use    Jeremiah Zhang  . Arthritis   . ED (erectile dysfunction)   . GERD (gastroesophageal reflux disease)   . Hiatal hernia   . History of DVT of lower  extremity    POST OP KNEE SURGERY  . History of transient ischemic attack (TIA)    PER PT HX  ?POSSIBLE-- PT ON Jeremiah Zhang  . Hyperlipidemia   . Hypertension   . Hypogonadism male   . OSA (obstructive sleep apnea)    per pt positive sleep study yrs ago recommended CPAP --  pt Non- compliant -- states uses Breath Rite Nasal Strips  . Peripheral neuropathy   . Prostate cancer [facility]) urologist-  dr Jeremiah Zhang/  oncologist-- dr Jeremiah Zhang   dx 03-25-2015  T1c, Gleason 4+3  s/p  cryoablation prostate 06-02-2015/  external  radiation therapy 08-16-2016 to 09-20-2016/  scheduled for radioactive seed implants 10-22-2016  . Type 2 diabetes mellitus ([place])    followed by dr Jeremiah Zhang [facility] clinic)  last A1c 8.4 on 07-27-2016 per lov note  . Wears glasses   . Wears hearing aid    bilateral    Past Surgical History:  Procedure Laterality Date  . COLONOSCOPY WITH PROPOFOL N/A 05/30/2017   Procedure: COLONOSCOPY WITH PROPOFOL;  Surgeon: Jeremiah Silvas, MD;  Location: [facility] ENDOSCOPY;  Service: Endoscopy;  Laterality: N/A;  . CRYOABLATION N/A 06/02/2015   Procedure: CRYO ABLATION PROSTATE;  Surgeon: Jeremiah Clines, MD;  Location: [facility];  Service: Urology;  Laterality: N/A;  . ELBOW BURSA SURGERY Left Feb 1997  . RADIOACTIVE SEED IMPLANT N/A 10/22/2016   Procedure: RADIOACTIVE SEED IMPLANT/BRACHYTHERAPY IMPLANT, URETHRAL STRICTURE DIALIATION;  Surgeon: Jeremiah Clines, MD;  Location: [facility];  Service: Urology;  Laterality: N/A;  . SHOULDER OPEN ROTATOR CUFF REPAIR Left 09-04-2014  . TOTAL KNEE ARTHROPLASTY Bilateral right 08-13-2006  &  left 11-14-2008  . UMBILICAL HERNIA REPAIR  06-23-2012    Family History  Problem Relation Age of Onset  . Cancer Mother 55       colon/resection   Social History:  reports that he has never smoked. He has never used smokeless tobacco. He reports that he does not drink alcohol or use drugs.  Allergies:  Allergies   Allergen Reactions  . Bee Venom Anaphylaxis  . Crestor [Rosuvastatin] Other (See Comments)    "muscle weakness and discomfort"  . Vytorin [Ezetimibe-Simvastatin] Other (See Comments)    "muscle weakness and discomfort"  . Dynapen [Dicloxacillin] Rash    No medications prior to admission.    No results found for this or any previous visit (from the past 48 hour(s)). No results found.  ROS  Height 5' 9.5" (1.765 m), weight 104.3 kg. Physical Exam   Assessment/Plan  Proceed as planned with cysto and dilation. Risks, benefits, alternatives, expected peri-op course including need for temporary post-op foley discussed previously and reiterated today.   Jeremiah Frock, MD 05/04/2019, 6:41 AM

## 2019-05-04 NOTE — Anesthesia Procedure Notes (Signed)
Procedure Name: LMA Insertion Date/Time: 05/04/2019 9:37 AM Performed by: Bonney Aid, CRNA Pre-anesthesia Checklist: Patient identified, Emergency Drugs available, Suction available and Patient being monitored Patient Re-evaluated:Patient Re-evaluated prior to induction Oxygen Delivery Method: Circle system utilized Preoxygenation: Pre-oxygenation with 100% oxygen Induction Type: IV induction Ventilation: Mask ventilation without difficulty LMA: LMA inserted LMA Size: 5.0 Number of attempts: 1 Airway Equipment and Method: Bite block Placement Confirmation: positive ETCO2 Tube secured with: Tape Dental Injury: Teeth and Oropharynx as per pre-operative assessment

## 2019-05-04 NOTE — Brief Op Note (Signed)
05/04/2019  9:55 AM  PATIENT:  Jeremiah Zhang  73 y.o. male  PRE-OPERATIVE DIAGNOSIS:  URETHRAL STRICTURE  POST-OPERATIVE DIAGNOSIS:  URETHRAL STRICTURE  PROCEDURE:  Procedure(s): CYSTOSCOPY WITH URETHRAL DILATATION (N/A)  SURGEON:  Surgeon(s) and Role:    * [name], MD - Primary  PHYSICIAN ASSISTANT:   ASSISTANTS: none   ANESTHESIA:   none  EBL:  0 mL   BLOOD ADMINISTERED:none  DRAINS: 73F foley to gravity   LOCAL MEDICATIONS USED:  NONE  SPECIMEN:  No Specimen  DISPOSITION OF SPECIMEN:  N/A  COUNTS:  YES  TOURNIQUET:  * No tourniquets in log *  DICTATION: .Other Dictation: Dictation Number [id number]  PLAN OF CARE: Discharge to home after PACU  PATIENT DISPOSITION:  PACU - hemodynamically stable.   Delay start of Pharmacological VTE agent (>24hrs) due to surgical blood loss or risk of bleeding: yes

## 2019-05-04 NOTE — Transfer of Care (Signed)
Immediate Anesthesia Transfer of Care Note  Patient: Jeremiah Zhang  Procedure(s) Performed: CYSTOSCOPY WITH URETHRAL DILATATION (N/A Urethra)  Patient Location: PACU  Anesthesia Type:General  Level of Consciousness: awake, alert  and oriented  Airway & Oxygen Therapy: Patient Spontanous Breathing and Patient connected to nasal cannula oxygen  Post-op Assessment: Report given to RN  Post vital signs: Reviewed and stable  Last Vitals:  Vitals Value Taken Time  BP 132/95 05/04/19 1003  Temp    Pulse 66 05/04/19 1006  Resp 15 05/04/19 1006  SpO2 92 % 05/04/19 1006  Vitals shown include unvalidated device data.  Last Pain:  Vitals:   05/04/19 0846  TempSrc:   PainSc: 0-No pain      Patients Stated Pain Goal: 5 (22/84/06 9861)  Complications: No apparent anesthesia complications

## 2019-05-04 NOTE — Discharge Instructions (Signed)
1 - You may have urinary urgency (bladder spasms) and bloody urine on / off with catheter in place. This is normal.  2 - Call MD or go to ER for fever >102, severe pain / nausea / vomiting not relieved by medications, or acute change in medical status  Post Anesthesia Home Care Instructions  Activity: Get plenty of rest for the remainder of the day. A responsible adult should stay with you for 24 hours following the procedure.  For the next 24 hours, DO NOT: -Drive a car -Paediatric nurse -Drink alcoholic beverages -Take any medication unless instructed by your physician -Make any legal decisions or sign important papers.  Meals: Start with liquid foods such as gelatin or soup. Progress to regular foods as tolerated. Avoid greasy, spicy, heavy foods. If nausea and/or vomiting occur, drink only clear liquids until the nausea and/or vomiting subsides. Call your physician if vomiting continues.  Special Instructions/Symptoms: Your throat may feel dry or sore from the anesthesia or the breathing tube placed in your throat during surgery. If this causes discomfort, gargle with warm salt water. The discomfort should disappear within 24 hours.  If you had a scopolamine patch placed behind your ear for the management of post- operative nausea and/or vomiting:  1. The medication in the patch is effective for 72 hours, after which it should be removed.  Wrap patch in a tissue and discard in the trash. Wash hands thoroughly with soap and water. 2. You may remove the patch earlier than 72 hours if you experience unpleasant side effects which may include dry mouth, dizziness or visual disturbances. 3. Avoid touching the patch. Wash your hands with soap and water after contact with the patch.  Indwelling Urinary Catheter Care, Adult An indwelling urinary catheter is a thin tube that is put into your bladder. The tube helps to drain pee (urine) out of your body. The tube goes in through your urethra.  Your urethra is where pee comes out of your body. Your pee will come out through the catheter, then it will go into a bag (drainage bag). Take good care of your catheter so it will work well. How to wear your catheter and bag Supplies needed  Sticky tape (adhesive tape) or a leg strap.  Alcohol wipe or soap and water (if you use tape).  A clean towel (if you use tape).  Large overnight bag.  Smaller bag (leg bag). Wearing your catheter Attach your catheter to your leg with tape or a leg strap.  Make sure the catheter is not pulled tight.  If a leg strap gets wet, take it off and put on a dry strap.  If you use tape to hold the bag on your leg: 1. Use an alcohol wipe or soap and water to wash your skin where the tape made it sticky before. 2. Use a clean towel to pat-dry that skin. 3. Use new tape to make the bag stay on your leg. Wearing your bags You should have been given a large overnight bag.  You may wear the overnight bag in the day or night.  Always have the overnight bag lower than your bladder.  Do not let the bag touch the floor.  Before you go to sleep, put a clean plastic bag in a wastebasket. Then hang the overnight bag inside the wastebasket. You should also have a smaller leg bag that fits under your clothes.  Always wear the leg bag below your knee.  Do not wear  your leg bag at night. How to care for your skin and catheter Supplies needed  A clean washcloth.  Water and mild soap.  A clean towel. Caring for your skin and catheter      Clean the skin around your catheter every day: ? Wash your hands with soap and water. ? Wet a clean washcloth in warm water and mild soap. ? Clean the skin around your urethra. ? If you are male: ? Gently spread the folds of skin around your vagina (labia). ? With the washcloth in your other hand, wipe the inner side of your labia on each side. Wipe from front to back. ? If you are male: ? Pull back any skin  that covers the end of your penis (foreskin). ? With the washcloth in your other hand, wipe your penis in small circles. Start wiping at the tip of your penis, then move away from the catheter. ? Move the foreskin back in place, if needed. ? With your free hand, hold the catheter close to where it goes into your body. ? Keep holding the catheter during cleaning so it does not get pulled out. ? With the washcloth in your other hand, clean the catheter. ? Only wipe downward on the catheter. ? Do not wipe upward toward your body. Doing this may push germs into your urethra and cause infection. ? Use a clean towel to pat-dry the catheter and the skin around it. Make sure to wipe off all soap. ? Wash your hands with soap and water.  Shower every day. Do not take baths.  Do not use cream, ointment, or lotion on the area where the catheter goes into your body, unless your doctor tells you to.  Do not use powders, sprays, or lotions on your genital area.  Check your skin around the catheter every day for signs of infection. Check for: ? Redness, swelling, or pain. ? Fluid or blood. ? Warmth. ? Pus or a bad smell. How to empty the bag Supplies needed  Rubbing alcohol.  Gauze pad or cotton ball.  Tape or a leg strap. Emptying the bag Pour the pee out of your bag when it is ?- full, or at least 2-3 times a day. Do this for your overnight bag and your leg bag. 1. Wash your hands with soap and water. 2. Separate (detach) the bag from your leg. 3. Hold the bag over the toilet or a clean pail. Keep the bag lower than your hips and bladder. This is so the pee (urine) does not go back into the tube. 4. Open the pour spout. It is at the bottom of the bag. 5. Empty the pee into the toilet or pail. Do not let the pour spout touch any surface. 6. Put rubbing alcohol on a gauze pad or cotton ball. 7. Use the gauze pad or cotton ball to clean the pour spout. 8. Close the pour spout. 9. Attach the  bag to your leg with tape or a leg strap. 10. Wash your hands with soap and water. Follow instructions for cleaning the drainage bag:  From the product maker.  As told by your doctor. How to change the bag Supplies needed  Alcohol wipes.  A clean bag.  Tape or a leg strap. Changing the bag Replace your bag when it starts to leak, smell bad, or look dirty. 1. Wash your hands with soap and water. 2. Separate the dirty bag from your leg. 3. Pinch the catheter with  your fingers so that pee does not spill out. 4. Separate the catheter tube from the bag tube where these tubes connect (at the connection valve). Do not let the tubes touch any surface. 5. Clean the end of the catheter tube with an alcohol wipe. Use a different alcohol wipe to clean the end of the bag tube. 6. Connect the catheter tube to the tube of the clean bag. 7. Attach the clean bag to your leg with tape or a leg strap. Do not make the bag tight on your leg. 8. Wash your hands with soap and water. General rules   Never pull on your catheter. Never try to take it out. Doing that can hurt you.  Always wash your hands before and after you touch your catheter or bag. Use a mild, fragrance-free soap. If you do not have soap and water, use hand sanitizer.  Always make sure there are no twists or bends (kinks) in the catheter tube.  Always make sure there are no leaks in the catheter or bag.  Drink enough fluid to keep your pee pale yellow.  Do not take baths, swim, or use a hot tub.  If you are male, wipe from front to back after you poop (have a bowel movement). Contact a doctor if:  Your pee is cloudy.  Your pee smells worse than usual.  Your catheter gets clogged.  Your catheter leaks.  Your bladder feels full. Get help right away if:  You have redness, swelling, or pain where the catheter goes into your body.  You have fluid, blood, pus, or a bad smell coming from the area where the catheter goes  into your body.  Your skin feels warm where the catheter goes into your body.  You have a fever.  You have pain in your: ? Belly (abdomen). ? Legs. ? Lower back. ? Bladder.  You see blood in the catheter.  Your pee is pink or red.  You feel sick to your stomach (nauseous).  You throw up (vomit).  You have chills.  Your pee is not draining into the bag.  Your catheter gets pulled out. Summary  An indwelling urinary catheter is a thin tube that is placed into the bladder to help drain pee (urine) out of the body.  The catheter is placed into the part of the body that drains pee from the bladder (urethra).  Taking good care of your catheter will keep it working properly and help prevent problems.  Always wash your hands before and after touching your catheter or bag.  Never pull on your catheter or try to take it out. This information is not intended to replace advice given to you by your health care provider. Make sure you discuss any questions you have with your health care provider. Document Released: 01/29/2013 Document Revised: 01/26/2019 Document Reviewed: 05/20/2017 Elsevier Patient Education  2020 [employer].

## 2019-05-05 ENCOUNTER — Other Ambulatory Visit: Payer: Self-pay | Admitting: Urology

## 2019-05-05 MED ORDER — OXYBUTYNIN CHLORIDE ER 5 MG PO TB24: 5.0000 mg | ORAL_TABLET | Freq: Every day | ORAL | 0 refills | Status: DC

## 2019-05-05 NOTE — Progress Notes (Signed)
Patient called with bladder spasms. Given ditropan. Told to stop 48hrs before foley removed. Discussed side effects

## 2019-05-07 ENCOUNTER — Encounter (HOSPITAL_BASED_OUTPATIENT_CLINIC_OR_DEPARTMENT_OTHER): Payer: Self-pay | Admitting: Urology

## 2019-06-14 ENCOUNTER — Other Ambulatory Visit: Payer: Self-pay | Admitting: Family Medicine

## 2019-06-18 MED ORDER — TESTOSTERONE 20.25 MG/ACT (1.62%) TD GEL: TRANSDERMAL | 3 refills | Status: DC

## 2019-06-21 ENCOUNTER — Telehealth: Payer: Self-pay | Admitting: Urology

## 2019-06-21 NOTE — Telephone Encounter (Signed)
Merrilee Seashore from [facility] called and stated that he needed a new prescription for the patient's Androgel because his insurance would not cover a 30 day supply but would pay for a 90 day supply. Could you send in a new one to then pharmacy please?    Thanks, Sharyn Lull

## 2019-06-22 MED ORDER — TESTOSTERONE 20.25 MG/ACT (1.62%) TD GEL: TRANSDERMAL | 0 refills | Status: DC

## 2019-07-24 ENCOUNTER — Encounter: Payer: Self-pay | Admitting: Urology

## 2019-07-25 ENCOUNTER — Other Ambulatory Visit: Payer: Self-pay | Admitting: Urology

## 2019-08-22 ENCOUNTER — Telehealth (INDEPENDENT_AMBULATORY_CARE_PROVIDER_SITE_OTHER): Payer: Self-pay | Admitting: Nurse Practitioner

## 2019-08-27 ENCOUNTER — Ambulatory Visit: Payer: Medicare Other | Admitting: Podiatry

## 2019-08-27 ENCOUNTER — Other Ambulatory Visit: Payer: Self-pay

## 2019-08-27 DIAGNOSIS — E1149 Type 2 diabetes mellitus with other diabetic neurological complication: Secondary | ICD-10-CM | POA: Diagnosis not present

## 2019-08-27 DIAGNOSIS — L603 Nail dystrophy: Secondary | ICD-10-CM

## 2019-08-27 NOTE — Patient Instructions (Signed)
Diabetes Mellitus and Foot Care Foot care is an important part of your health, especially when you have diabetes. Diabetes may cause you to have problems because of poor blood flow (circulation) to your feet and legs, which can cause your skin to:  Become thinner and drier.  Break more easily.  Heal more slowly.  Peel and crack. You may also have nerve damage (neuropathy) in your legs and feet, causing decreased feeling in them. This means that you may not notice minor injuries to your feet that could lead to more serious problems. Noticing and addressing any potential problems early is the best way to prevent future foot problems. How to care for your feet Foot hygiene  Wash your feet daily with warm water and mild soap. Do not use hot water. Then, pat your feet and the areas between your toes until they are completely dry. Do not soak your feet as this can dry your skin.  Trim your toenails straight across. Do not dig under them or around the cuticle. File the edges of your nails with an emery board or nail file.  Apply a moisturizing lotion or petroleum jelly to the skin on your feet and to dry, brittle toenails. Use lotion that does not contain alcohol and is unscented. Do not apply lotion between your toes. Shoes and socks  Wear clean socks or stockings every day. Make sure they are not too tight. Do not wear knee-high stockings since they may decrease blood flow to your legs.  Wear shoes that fit properly and have enough cushioning. Always look in your shoes before you put them on to be sure there are no objects inside.  To break in new shoes, wear them for just a few hours a day. This prevents injuries on your feet. Wounds, scrapes, corns, and calluses  Check your feet daily for blisters, cuts, bruises, sores, and redness. If you cannot see the bottom of your feet, use a mirror or ask someone for help.  Do not cut corns or calluses or try to remove them with medicine.  If you  find a minor scrape, cut, or break in the skin on your feet, keep it and the skin around it clean and dry. You may clean these areas with mild soap and water. Do not clean the area with peroxide, alcohol, or iodine.  If you have a wound, scrape, corn, or callus on your foot, look at it several times a day to make sure it is healing and not infected. Check for: ? Redness, swelling, or pain. ? Fluid or blood. ? Warmth. ? Pus or a bad smell. General instructions  Do not cross your legs. This may decrease blood flow to your feet.  Do not use heating pads or hot water bottles on your feet. They may burn your skin. If you have lost feeling in your feet or legs, you may not know this is happening until it is too late.  Protect your feet from hot and cold by wearing shoes, such as at the beach or on hot pavement.  Schedule a complete foot exam at least once a year (annually) or more often if you have foot problems. If you have foot problems, report any cuts, sores, or bruises to your health care provider immediately. Contact a health care provider if:  You have a medical condition that increases your risk of infection and you have any cuts, sores, or bruises on your feet.  You have an injury that is not   healing.  You have redness on your legs or feet.  You feel burning or tingling in your legs or feet.  You have pain or cramps in your legs and feet.  Your legs or feet are numb.  Your feet always feel cold.  You have pain around a toenail. Get help right away if:  You have a wound, scrape, corn, or callus on your foot and: ? You have pain, swelling, or redness that gets worse. ? You have fluid or blood coming from the wound, scrape, corn, or callus. ? Your wound, scrape, corn, or callus feels warm to the touch. ? You have pus or a bad smell coming from the wound, scrape, corn, or callus. ? You have a fever. ? You have a red line going up your leg. Summary  Check your feet every day  for cuts, sores, red spots, swelling, and blisters.  Moisturize feet and legs daily.  Wear shoes that fit properly and have enough cushioning.  If you have foot problems, report any cuts, sores, or bruises to your health care provider immediately.  Schedule a complete foot exam at least once a year (annually) or more often if you have foot problems. This information is not intended to replace advice given to you by your health care provider. Make sure you discuss any questions you have with your health care provider. Document Released: 10/01/2000 Document Revised: 11/16/2017 Document Reviewed: 11/05/2016 Elsevier Patient Education  2020 [employer]  

## 2019-08-28 ENCOUNTER — Encounter: Payer: Self-pay | Admitting: Podiatry

## 2019-08-28 DIAGNOSIS — E669 Obesity, unspecified: Secondary | ICD-10-CM | POA: Insufficient documentation

## 2019-08-28 DIAGNOSIS — I10 Essential (primary) hypertension: Secondary | ICD-10-CM | POA: Insufficient documentation

## 2019-08-28 DIAGNOSIS — K219 Gastro-esophageal reflux disease without esophagitis: Secondary | ICD-10-CM | POA: Insufficient documentation

## 2019-08-28 DIAGNOSIS — E785 Hyperlipidemia, unspecified: Secondary | ICD-10-CM | POA: Insufficient documentation

## 2019-08-28 DIAGNOSIS — R739 Hyperglycemia, unspecified: Secondary | ICD-10-CM | POA: Insufficient documentation

## 2019-08-28 NOTE — Addendum Note (Signed)
Addended by: Cranford Mon R on: 08/28/2019 09:05 AM   Modules accepted: Orders

## 2019-08-28 NOTE — Progress Notes (Signed)
Subjective:   Patient ID: Jeremiah Zhang, male   DOB: 73 y.o.   MRN: [id number]   HPI 72 year old male presents the office today with his wife for concerns of possible toenail fungus as well as a " sensitivity" to the tops of both feet.  He states the sensitivity happens a couple times a week and gets to the point where his wife cannot even touch the tops of his feet.  He does have history of neuropathy is previously seen by chiropractor for this and last seen about 1 year ago.  He denies any claudication symptoms.  Regard to the toenails he states he previously was on Lamisil which did not help.  He also had a culture which was negative for fungus previously.  He was told the nails are thick because he hit the end of the shoes.  He has no other concerns today.  Review of Systems  All other systems reviewed and are negative.  Past Medical History:  Diagnosis Date  . Anticoagulant long-term use    ELIQUIS  . Arthritis   . ED (erectile dysfunction)   . GERD (gastroesophageal reflux disease)   . Hiatal hernia   . History of DVT of lower extremity    POST OP KNEE SURGERY  . History of transient ischemic attack (TIA)    PER PT HX  ?POSSIBLE-- PT ON ELIQUIS  . Hyperlipidemia   . Hypertension   . Hypogonadism male   . OSA (obstructive sleep apnea)    per pt positive sleep study yrs ago recommended CPAP --  pt Non- compliant -- states uses Breath Rite Nasal Strips  . Peripheral neuropathy   . Prostate cancer [facility]) urologist-  dr tannenbaum/  oncologist-- dr [name]   dx 03-25-2015  T1c, Gleason 4+3  s/p  cryoablation prostate 06-02-2015/  external  radiation therapy 08-16-2016 to 09-20-2016/  scheduled for radioactive seed implants 10-22-2016  . Type 2 diabetes mellitus ([place])    followed by dr [name] [facility] clinic)  last A1c 8.4 on 07-27-2016 per lov note  . Wears glasses   . Wears hearing aid    bilateral    Past Surgical History:  Procedure Laterality Date  . COLONOSCOPY  WITH PROPOFOL N/A 05/30/2017   Procedure: COLONOSCOPY WITH PROPOFOL;  Surgeon: [name], MD;  Location: [facility] ENDOSCOPY;  Service: Endoscopy;  Laterality: N/A;  . CRYOABLATION N/A 06/02/2015   Procedure: CRYO ABLATION PROSTATE;  Surgeon: Carolan Clines, MD;  Location: [facility];  Service: Urology;  Laterality: N/A;  . CYSTOSCOPY WITH URETHRAL DILATATION N/A 05/04/2019   Procedure: CYSTOSCOPY WITH URETHRAL DILATATION;  Surgeon: [name], MD;  Location: [facility];  Service: Urology;  Laterality: N/A;  . ELBOW BURSA SURGERY Left Feb 1997  . RADIOACTIVE SEED IMPLANT N/A 10/22/2016   Procedure: RADIOACTIVE SEED IMPLANT/BRACHYTHERAPY IMPLANT, URETHRAL STRICTURE DIALIATION;  Surgeon: Carolan Clines, MD;  Location: [place];  Service: Urology;  Laterality: N/A;  . SHOULDER OPEN ROTATOR CUFF REPAIR Left 09-04-2014  . TOTAL KNEE ARTHROPLASTY Bilateral right 08-13-2006  &  left 11-14-2008  . UMBILICAL HERNIA REPAIR  06-23-2012     Current Outpatient Medications:  .  amLODipine (NORVASC) 5 MG tablet, TAKE 1 TABLET(5 MG) BY MOUTH EVERY DAY, Disp: , Rfl:  .  apixaban (ELIQUIS) 5 MG TABS tablet, TAKE 1 TABLET(5 MG) BY MOUTH TWICE DAILY, Disp: , Rfl:  .  bisoprolol-hydrochlorothiazide (ZIAC) 10-6.25 MG tablet, TAKE 1 TABLET BY MOUTH EVERY DAY, Disp: ,  Rfl:  .  esomeprazole (NEXIUM) 40 MG capsule, TAKE 1 CAPSULE BY MOUTH 2 TIMES DAILY, Disp: , Rfl:  .  glimepiride (AMARYL) 2 MG tablet, TAKE 1 TABLET BY MOUTH TWICE DAILY, Disp: , Rfl:  .  quinapril (ACCUPRIL) 20 MG tablet, TAKE 1 TABLET BY MOUTH TWICE DAILY, Disp: , Rfl:  .  saxagliptin HCl (ONGLYZA) 5 MG TABS tablet, TAKE 1 TABLET BY MOUTH EVERY DAY, Disp: , Rfl:  .  tadalafil (CIALIS) 5 MG tablet, Take by mouth., Disp: , Rfl:  .  ACCU-CHEK FASTCLIX LANCETS MISC, , Disp: , Rfl: 0 .  Ascorbic Acid (VITAMIN C) 1000 MG tablet, Take 1,000 mg by mouth daily., Disp: , Rfl:  .   aspirin-acetaminophen-caffeine (EXCEDRIN MIGRAINE) 250-250-65 MG tablet, Take by mouth every 6 (six) hours as needed for headache., Disp: , Rfl:  .  bisoprolol-hydrochlorothiazide (ZIAC) 10-6.25 MG per tablet, Take 1 tablet by mouth every morning., Disp: , Rfl:  .  Cholecalciferol (D2000 ULTRA STRENGTH) 50 MCG (2000 UT) CAPS, Take by mouth., Disp: , Rfl:  .  Cholecalciferol (VITAMIN D3) 2000 UNITS TABS, Take 1 capsule by mouth daily., Disp: , Rfl:  .  ciprofloxacin (CIPRO) 250 MG tablet, Take 1 tablet (250 mg total) by mouth 2 (two) times daily. X 3 days. Begin day before next Urology appointment., Disp: 6 tablet, Rfl: 0 .  EPINEPHrine 0.3 mg/0.3 mL IJ SOAJ injection, Inject into the muscle as needed., Disp: , Rfl:  .  esomeprazole (NEXIUM) 40 MG capsule, Take 40 mg by mouth every evening. , Disp: , Rfl:  .  fluticasone (FLONASE) 50 MCG/ACT nasal spray, SHAKE LQ AND U 1 SPR IEN BID, Disp: , Rfl: 1 .  FLUZONE HIGH-DOSE QUADRIVALENT 0.7 ML SUSY, ADM 0.7ML IM UTD, Disp: , Rfl:  .  glimepiride (AMARYL) 2 MG tablet, , Disp: , Rfl: 0 .  glucose blood test strip, TEST TWICE DAILY AS INSTRUCTED, Disp: , Rfl:  .  ibuprofen (ADVIL,MOTRIN) 200 MG tablet, Take 200 mg by mouth every 6 (six) hours as needed., Disp: , Rfl:  .  meclizine (ANTIVERT) 25 MG tablet, , Disp: , Rfl:  .  metFORMIN (GLUCOPHAGE) 1000 MG tablet, , Disp: , Rfl:  .  metFORMIN (GLUCOPHAGE-XR) 500 MG 24 hr tablet, take 1 tablet by mouth twice a day with meals, Disp: , Rfl:  .  Multiple Vitamin (DAILY VITAMINS) tablet, Take by mouth., Disp: , Rfl:  .  ONGLYZA 5 MG TABS tablet, Take 5 mg by mouth daily., Disp: , Rfl: 0 .  oxybutynin (DITROPAN XL) 5 MG 24 hr tablet, Take 1 tablet (5 mg total) by mouth at bedtime., Disp: 3 tablet, Rfl: 0 .  oxyCODONE-acetaminophen (PERCOCET) 5-325 MG tablet, Take 1 tablet by mouth every 4 (four) hours as needed for severe pain. Post-operatively, Disp: 15 tablet, Rfl: 0 .  quinapril (ACCUPRIL) 20 MG tablet, Take 20  mg by mouth 2 (two) times daily., Disp: , Rfl:  .  Red Yeast Rice Extract (RED YEAST RICE PO), Take by mouth 2 (two) times daily., Disp: , Rfl:  .  Saw Palmetto 160 MG CAPS, Take by mouth., Disp: , Rfl:  .  senna-docusate (SENOKOT-S) 8.6-50 MG tablet, Take 1 tablet by mouth 2 (two) times daily. While taking strong pain meds to prevent constipation, Disp: 20 tablet, Rfl: 0 .  Testosterone 20.25 MG/ACT (1.62%) GEL, Apply 1 pump to each shoulder daily, Disp: 225 g, Rfl: 0 .  Testosterone 20.25 MG/ACT (1.62%) GEL, Apply topically., Disp: ,  Rfl:  .  triamcinolone cream (KENALOG) 0.1 %, APP EXT AA BID, Disp: , Rfl: 0 No current facility-administered medications for this visit.   Facility-Administered Medications Ordered in Other Visits:  .  acetaminophen (TYLENOL) tablet 975 mg, 975 mg, Oral, Q6H PRN, Carolan Clines, MD  Allergies  Allergen Reactions  . Bee Venom Anaphylaxis  . Crestor [Rosuvastatin] Other (See Comments)    "muscle weakness and discomfort"  . Vytorin [Ezetimibe-Simvastatin] Other (See Comments)    "muscle weakness and discomfort"  . Dynapen [Dicloxacillin] Rash          Objective:  Physical Exam  General: AAO x3, NAD  Dermatological: The nails appear to be hypertrophic, dystrophic with yellow discoloration of the second digit toenails the most symptomatic.  There is currently no pain in the nails there is no redness or drainage or any signs of infection.  There is no open lesions.  Vascular: Dorsalis Pedis artery and Posterior Tibial artery pedal pulses are 2/4 bilateral with immedate capillary fill time. There is no pain with calf compression, swelling, warmth, erythema.   Neruologic: Sensation mildly decreased in the plantar forefoot with Semmes Weinstein monofilament.  Negative Tinel sign.  Musculoskeletal: Hammertoe contractures are present most of the second digits and which appear to be semirigid.  There is no area pinpoint tenderness.  There are transverse  palpable at the dorsal medial midfoot bilaterally but no significant pain.  Muscular strength 5/5 in all groups tested bilateral.  Gait: Unassisted, Nonantalgic.      Assessment:   73 year old male with type 2 diabetes with neuropathy; onychodystrophy    Plan:  -Treatment options discussed including all alternatives, risks, and complications -Etiology of symptoms were discussed -I do think that the sensitivity she is describing is due to neuropathy.  Appears to be symmetrical on the dorsal aspect of the feet.  We discussed treatment options.  He does proceed with topical.  Ordered a compound cream today to [place] for neuropathy. -Nails were smoothed today without any complications or bleeding.  Prescribed urea cream again to [facility] for the toenails.  Trula Slade DPM

## 2019-10-31 ENCOUNTER — Ambulatory Visit: Payer: Medicare Other | Admitting: Urology

## 2019-10-31 ENCOUNTER — Encounter: Payer: Self-pay | Admitting: Urology

## 2019-10-31 ENCOUNTER — Other Ambulatory Visit: Payer: Self-pay

## 2019-10-31 VITALS — BP 165/100 | HR 83 | Ht 69.0 in | Wt 231.0 lb

## 2019-10-31 DIAGNOSIS — E291 Testicular hypofunction: Secondary | ICD-10-CM | POA: Diagnosis not present

## 2019-10-31 DIAGNOSIS — C61 Malignant neoplasm of prostate: Secondary | ICD-10-CM

## 2019-10-31 MED ORDER — TADALAFIL 20 MG PO TABS: ORAL_TABLET | ORAL | 0 refills | Status: DC

## 2019-10-31 NOTE — Progress Notes (Signed)
10/31/2019 9:35 AM   Gillermina Phy 09/03/1946 [id number]  Referring provider: [name], MD [phone] [facility] [place],  [place] [address]  Chief Complaint  Patient presents with  . Follow-up    Urologic history: 1.Prostate cancer -Gleason 4+3 adenocarcinoma left base diagnosed 2016 and treated with focal cryotherapy by Dr. 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 male presents for follow-up.  He was seen at [facility] July 2020 for a recurrent urethral stricture and underwent cystoscopy with balloon dilation by Dr. [name].  He states he is presently voiding with a good stream and has occasional leakage which is not bothersome.  He did undergo [facility] for ED which was not successful however states he was told by their staff that tadalafil or sildenafil is sometimes effective after treatment.  He was interested in a trial of tadalafil.   PMH: Past Medical History:  Diagnosis Date  . Anticoagulant long-term use    ELIQUIS  . Arthritis   . ED (erectile dysfunction)   . GERD (gastroesophageal reflux disease)   . Hiatal hernia   . History of DVT of lower extremity    POST OP KNEE SURGERY  . History of transient ischemic attack (TIA)    PER PT HX  ?POSSIBLE-- PT ON ELIQUIS  . Hyperlipidemia   . Hypertension   . Hypogonadism male   . OSA (obstructive sleep apnea)    per pt positive sleep study yrs ago recommended CPAP --  pt Non- compliant -- states uses Breath Rite Nasal Strips  . Peripheral neuropathy   . Prostate cancer [facility]) urologist-  dr tannenbaum/  oncologist-- dr [name]   dx 03-25-2015  T1c, Gleason 4+3  s/p  cryoablation prostate 06-02-2015/  external  radiation therapy 08-16-2016 to 09-20-2016/   scheduled for radioactive seed implants 10-22-2016  . Type 2 diabetes mellitus ([place])    followed by dr [name] [facility] clinic)  last A1c 8.4 on 07-27-2016 per lov note  . Wears glasses   . Wears hearing aid    bilateral    Surgical History: Past Surgical History:  Procedure Laterality Date  . COLONOSCOPY WITH PROPOFOL N/A 05/30/2017   Procedure: COLONOSCOPY WITH PROPOFOL;  Surgeon: [name], MD;  Location: [facility] ENDOSCOPY;  Service: Endoscopy;  Laterality: N/A;  . CRYOABLATION N/A 06/02/2015   Procedure: CRYO ABLATION PROSTATE;  Surgeon: Carolan Clines, MD;  Location: [facility];  Service: Urology;  Laterality: N/A;  . CYSTOSCOPY WITH URETHRAL DILATATION N/A 05/04/2019   Procedure: CYSTOSCOPY WITH URETHRAL DILATATION;  Surgeon: [name], MD;  Location: [facility];  Service: Urology;  Laterality: N/A;  . ELBOW BURSA SURGERY Left Feb 1997  . RADIOACTIVE SEED IMPLANT N/A 10/22/2016   Procedure: RADIOACTIVE SEED IMPLANT/BRACHYTHERAPY IMPLANT, URETHRAL STRICTURE DIALIATION;  Surgeon: Carolan Clines, MD;  Location: [place];  Service: Urology;  Laterality: N/A;  . SHOULDER OPEN ROTATOR CUFF REPAIR Left 09-04-2014  . TOTAL KNEE ARTHROPLASTY Bilateral right 08-13-2006  &  left 11-14-2008  . UMBILICAL HERNIA REPAIR  06-23-2012    Home Medications:  Allergies as of 10/31/2019      Reactions   Bee Venom Anaphylaxis   Crestor [rosuvastatin] Other (See Comments)   "muscle weakness and discomfort"   Vytorin [ezetimibe-simvastatin] Other (See Comments)   "  muscle weakness and discomfort"   Dynapen [dicloxacillin] Rash      Medication List       Accurate as of October 31, 2019 11:59 PM. If you have any questions, ask your nurse or doctor.        STOP taking these medications   aspirin-acetaminophen-caffeine 250-250-65 MG tablet Commonly known as: EXCEDRIN MIGRAINE Stopped by: Abbie Sons, MD   oxybutynin 5  MG 24 hr tablet Commonly known as: Ditropan XL Stopped by: Abbie Sons, MD   oxyCODONE-acetaminophen 5-325 MG tablet Commonly known as: Percocet Stopped by: Abbie Sons, MD   senna-docusate 8.6-50 MG tablet Commonly known as: Senokot-S Stopped by: Abbie Sons, MD     TAKE these medications   Accu-Chek FastClix Lancets Misc   bisoprolol-hydrochlorothiazide 10-6.25 MG tablet Commonly known as: ZIAC TAKE 1 TABLET BY MOUTH EVERY DAY What changed: Another medication with the same name was removed. Continue taking this medication, and follow the directions you see here. Changed by: Abbie Sons, MD   Eliquis 5 MG Tabs tablet Generic drug: apixaban TAKE 1 TABLET(5 MG) BY MOUTH TWICE DAILY What changed: Another medication with the same name was removed. Continue taking this medication, and follow the directions you see here. Changed by: Abbie Sons, MD   EPINEPHrine 0.3 mg/0.3 mL Soaj injection Commonly known as: EPI-PEN Inject into the muscle as needed.   EPINEPHrine 0.3 mg/0.3 mL Soaj injection Commonly known as: EPI-PEN INJECT 0.3 MG INTO THE MUSCLE ONCE AS NEEDED FOR ANAPHYLAXIS   esomeprazole 40 MG capsule Commonly known as: NEXIUM Take 40 mg by mouth every evening.   glimepiride 2 MG tablet Commonly known as: AMARYL TAKE 1 TABLET BY MOUTH TWICE DAILY   glucose blood test strip TEST TWICE DAILY AS INSTRUCTED   metFORMIN 1000 MG tablet Commonly known as: GLUCOPHAGE TAKE 1 TABLET(1000 MG) BY MOUTH TWICE DAILY WITH MEALS What changed: Another medication with the same name was removed. Continue taking this medication, and follow the directions you see here. Changed by: Abbie Sons, MD   NON [facility]  Antifungal (nail)-#2  Cream-#11   Onglyza 5 MG Tabs tablet Generic drug: saxagliptin HCl Take 5 mg by mouth daily.   Onglyza 5 MG Tabs tablet Generic drug: saxagliptin HCl TAKE 1 TABLET BY MOUTH EVERY DAY   quinapril  20 MG tablet Commonly known as: ACCUPRIL TAKE 1 TABLET BY MOUTH TWICE DAILY   RED YEAST RICE PO Take by mouth 2 (two) times daily.   tadalafil 20 MG tablet Commonly known as: CIALIS 1 tab 60 minutes prior to intercourse Started by: Abbie Sons, MD   Testosterone 20.25 MG/ACT (1.62%) Gel Apply topically.   vitamin C 1000 MG tablet Take 1,000 mg by mouth daily.   Vitamin D3 50 MCG (2000 UT) Tabs Take 1 capsule by mouth daily.       Allergies:  Allergies  Allergen Reactions  . Bee Venom Anaphylaxis  . Crestor [Rosuvastatin] Other (See Comments)    "muscle weakness and discomfort"  . Vytorin [Ezetimibe-Simvastatin] Other (See Comments)    "muscle weakness and discomfort"  . Dynapen [Dicloxacillin] Rash    Family History: Family History  Problem Relation Age of Onset  . Cancer Mother 51       colon/resection    Social History:  reports that he has never smoked. He has never used smokeless tobacco. He reports that he does not drink alcohol or use drugs.  ROS: UROLOGY Frequent  Urination?: No Hard to postpone urination?: No Burning/pain with urination?: No Get up at night to urinate?: No Leakage of urine?: Yes Urine stream starts and stops?: No Trouble starting stream?: No Do you have to strain to urinate?: No Blood in urine?: No Urinary tract infection?: No Sexually transmitted disease?: No Injury to kidneys or bladder?: No Painful intercourse?: No Weak stream?: No Erection problems?: Yes Penile pain?: No  Gastrointestinal Nausea?: No Vomiting?: No Indigestion/heartburn?: No Diarrhea?: No Constipation?: No  Constitutional Fever: No Night sweats?: No Weight loss?: No Fatigue?: No  Skin Skin rash/lesions?: No Itching?: No  Eyes Blurred vision?: No Double vision?: No  Ears/Nose/Throat Sore throat?: No Sinus problems?: No  Hematologic/Lymphatic Swollen glands?: No Easy bruising?: No  Cardiovascular Leg swelling?: No Chest pain?:  No  Respiratory Cough?: No Shortness of breath?: No  Endocrine Excessive thirst?: No  Musculoskeletal Back pain?: No Joint pain?: No  Neurological Headaches?: No Dizziness?: No  Psychologic Depression?: No Anxiety?: No   Physical Exam: BP (!) 165/100   Pulse 83   Ht 5\' 9"  (1.753 m)   Wt 231 lb (104.8 kg)   BMI 34.11 kg/m   Constitutional:  Alert and oriented, No acute distress. HEENT: [place] AT, moist mucus membranes.  Trachea midline, no masses. Cardiovascular: No clubbing, cyanosis, or edema. Respiratory: Normal respiratory effort, no increased work of breathing. Skin: No rashes, bruises or suspicious lesions. Neurologic: Grossly intact, no focal deficits, moving all 4 extremities. Psychiatric: Normal mood and affect.  Assessment & Plan:    -Prostate cancer  PSA was drawn and he will be notified with results  - Hypogonadism in male Testosterone and hematocrit were ordered.  - History urethral stricture Doing well status post balloon dilation 04/2019  -Erectile dysfunction Rx generic tadalafil sent to [facility]  70-month lab visit for PSA, testosterone and hematocrit and 1 year follow-up office visit with labs.   Abbie Sons, [place] [address], [place], [place] [address] [phone]

## 2019-11-01 ENCOUNTER — Encounter: Payer: Self-pay | Admitting: Urology

## 2019-11-01 LAB — PSA: Prostate Specific Ag, Serum: <0.1 ng/mL (ref 0.0–4.0)

## 2019-11-01 LAB — HEMATOCRIT: Hematocrit: 44.4 % (ref 37.5–51.0)

## 2019-11-01 LAB — TESTOSTERONE: Testosterone: 195 ng/dL — ABNORMAL LOW (ref 264–916)

## 2019-11-02 ENCOUNTER — Telehealth: Payer: Self-pay | Admitting: *Deleted

## 2019-11-02 NOTE — Telephone Encounter (Signed)
Notified patient as instructed, patient pleased. Discussed follow-up appointments, patient agrees  

## 2019-11-02 NOTE — Telephone Encounter (Signed)
-----   Message from Abbie Sons, MD sent at 11/02/2019  7:41 AM EST ----- PSA level was undetectable at <0.1.  Testosterone level was low at 195.  Hematocrit was normal

## 2019-11-14 ENCOUNTER — Encounter: Payer: Self-pay | Admitting: Emergency Medicine

## 2019-11-14 ENCOUNTER — Emergency Department
Admission: EM | Admit: 2019-11-14 | Discharge: 2019-11-14 | Disposition: A | Discharge status: Home / Self Care | Payer: Medicare Other | Attending: Emergency Medicine | Admitting: Emergency Medicine

## 2019-11-14 ENCOUNTER — Other Ambulatory Visit: Payer: Self-pay

## 2019-11-14 DIAGNOSIS — Z7984 Long term (current) use of oral hypoglycemic drugs: Secondary | ICD-10-CM | POA: Insufficient documentation

## 2019-11-14 DIAGNOSIS — I10 Essential (primary) hypertension: Secondary | ICD-10-CM | POA: Diagnosis not present

## 2019-11-14 DIAGNOSIS — R040 Epistaxis: Secondary | ICD-10-CM | POA: Insufficient documentation

## 2019-11-14 DIAGNOSIS — E119 Type 2 diabetes mellitus without complications: Secondary | ICD-10-CM | POA: Diagnosis not present

## 2019-11-14 DIAGNOSIS — Z7901 Long term (current) use of anticoagulants: Secondary | ICD-10-CM | POA: Insufficient documentation

## 2019-11-14 LAB — CBC
HCT: 43.3 % (ref 39.0–52.0)
Hemoglobin: 15.3 g/dL (ref 13.0–17.0)
MCH: 32 pg (ref 26.0–34.0)
MCHC: 35.3 g/dL (ref 30.0–36.0)
MCV: 90.6 fL (ref 80.0–100.0)
Platelets: 285 10*3/uL (ref 150–400)
RBC: 4.78 MIL/uL (ref 4.22–5.81)
RDW: 13.3 % (ref 11.5–15.5)
WBC: 7 10*3/uL (ref 4.0–10.5)
nRBC: 0 % (ref 0.0–0.2)

## 2019-11-14 LAB — BASIC METABOLIC PANEL
Anion gap: 11 (ref 5–15)
BUN: 19 mg/dL (ref 8–23)
CO2: 27 mmol/L (ref 22–32)
Calcium: 9.4 mg/dL (ref 8.9–10.3)
Chloride: 101 mmol/L (ref 98–111)
Creatinine, Ser: 1.29 mg/dL — ABNORMAL HIGH (ref 0.61–1.24)
GFR calc Af Amer: >60 mL/min (ref >60)
GFR calc non Af Amer: 54 mL/min — ABNORMAL LOW (ref >60)
Glucose, Bld: 125 mg/dL — ABNORMAL HIGH (ref 70–99)
Potassium: 3.8 mmol/L (ref 3.5–5.1)
Sodium: 139 mmol/L (ref 135–145)

## 2019-11-14 NOTE — ED Provider Notes (Signed)
[facility] Emergency Department Provider Note ____________________________________________   First MD Initiated Contact with Patient 11/14/19 1640     (approximate)  I have reviewed the triage vital signs and the nursing notes.   HISTORY  Chief Complaint Epistaxis    HPI Jeremiah Zhang is a 74 y.o. male with PMH as noted below and who is on Eliquis who presents with a nosebleed from the left nare acute onset this afternoon, now ongoing for about 3 hours, and not associated with weakness or lightheadedness.  The patient states that he has had some mild nosebleeds in the past but not this long.  He states it started when he leaned forward.  He denies any shortness of breath, difficulty swallowing, or other acute symptoms.  Past Medical History:  Diagnosis Date  . Anticoagulant long-term use    ELIQUIS  . Arthritis   . ED (erectile dysfunction)   . GERD (gastroesophageal reflux disease)   . Hiatal hernia   . History of DVT of lower extremity    POST OP KNEE SURGERY  . History of transient ischemic attack (TIA)    PER PT HX  ?POSSIBLE-- PT ON ELIQUIS  . Hyperlipidemia   . Hypertension   . Hypogonadism male   . OSA (obstructive sleep apnea)    per pt positive sleep study yrs ago recommended CPAP --  pt Non- compliant -- states uses Breath Rite Nasal Strips  . Peripheral neuropathy   . Prostate cancer [facility]) urologist-  dr tannenbaum/  oncologist-- dr [name]   dx 03-25-2015  T1c, Gleason 4+3  s/p  cryoablation prostate 06-02-2015/  external  radiation therapy 08-16-2016 to 09-20-2016/  scheduled for radioactive seed implants 10-22-2016  . Type 2 diabetes mellitus ([place])    followed by dr [name] [facility] clinic)  last A1c 8.4 on 07-27-2016 per lov note  . Wears glasses   . Wears hearing aid    bilateral    Patient Active Problem List   Diagnosis Date Noted  . GERD (gastroesophageal reflux disease) 08/28/2019  . Hyperglycemia 08/28/2019   . Hyperlipidemia 08/28/2019  . Hypertension 08/28/2019  . Obesity 08/28/2019  . Myalgia due to statin 12/28/2018  . Hemorrhoids, internal, with bleeding 11/16/2018  . Erectile dysfunction following radiation therapy 08/10/2018  . Hypogonadism in male 05/11/2018  . Diabetes mellitus without complication ([place]) [id number]  . Prostate cancer ([place]) 07/14/2016  . Status post rotator cuff repair 01/05/2015  . Encounter for long-term (current) use of other medications 01/15/2014  . Benign prostatic hyperplasia without lower urinary tract symptoms 06/27/2012    Past Surgical History:  Procedure Laterality Date  . COLONOSCOPY WITH PROPOFOL N/A 05/30/2017   Procedure: COLONOSCOPY WITH PROPOFOL;  Surgeon: [name], MD;  Location: [facility] ENDOSCOPY;  Service: Endoscopy;  Laterality: N/A;  . CRYOABLATION N/A 06/02/2015   Procedure: CRYO ABLATION PROSTATE;  Surgeon: Carolan Clines, MD;  Location: [facility];  Service: Urology;  Laterality: N/A;  . CYSTOSCOPY WITH URETHRAL DILATATION N/A 05/04/2019   Procedure: CYSTOSCOPY WITH URETHRAL DILATATION;  Surgeon: [name], MD;  Location: [facility];  Service: Urology;  Laterality: N/A;  . ELBOW BURSA SURGERY Left Feb 1997  . RADIOACTIVE SEED IMPLANT N/A 10/22/2016   Procedure: RADIOACTIVE SEED IMPLANT/BRACHYTHERAPY IMPLANT, URETHRAL STRICTURE DIALIATION;  Surgeon: Carolan Clines, MD;  Location: [place];  Service: Urology;  Laterality: N/A;  . SHOULDER OPEN ROTATOR CUFF REPAIR Left 09-04-2014  . TOTAL KNEE ARTHROPLASTY Bilateral right 08-13-2006  &  left 11-14-2008  . UMBILICAL HERNIA REPAIR  06-23-2012    Prior to Admission medications   Medication Sig Start Date End Date Taking? Authorizing Provider  ACCU-CHEK FASTCLIX LANCETS [place]  10/29/16   [provider]  apixaban (ELIQUIS) 5 MG TABS tablet TAKE 1 TABLET(5 MG) BY MOUTH TWICE DAILY 10/01/19   [provider]   Ascorbic Acid (VITAMIN C) 1000 MG tablet Take 1,000 mg by mouth daily.    [provider]  bisoprolol-hydrochlorothiazide (ZIAC) 10-6.25 MG tablet TAKE 1 TABLET BY MOUTH EVERY DAY 10/01/19   [provider]  Cholecalciferol (VITAMIN D3) 2000 UNITS TABS Take 1 capsule by mouth daily.    [provider]  EPINEPHrine 0.3 mg/0.3 mL IJ SOAJ injection Inject into the muscle as needed.    [provider]  EPINEPHrine 0.3 mg/0.3 mL IJ SOAJ injection INJECT 0.3 MG INTO THE MUSCLE ONCE AS NEEDED FOR ANAPHYLAXIS 10/10/19   [provider]  esomeprazole (NEXIUM) 40 MG capsule Take 40 mg by mouth every evening.     [provider]  glimepiride (AMARYL) 2 MG tablet TAKE 1 TABLET BY MOUTH TWICE DAILY 07/03/12   [provider]  glucose blood test strip TEST TWICE DAILY AS INSTRUCTED 10/29/16   [provider]  metFORMIN (GLUCOPHAGE) 1000 MG tablet TAKE 1 TABLET(1000 MG) BY MOUTH TWICE DAILY WITH MEALS 10/01/19   [provider]  [facility]  Antifungal (nail)-#2  Cream-#11    [provider]  ONGLYZA 5 MG TABS tablet Take 5 mg by mouth daily. 03/27/18   [provider]  quinapril (ACCUPRIL) 20 MG tablet TAKE 1 TABLET BY MOUTH TWICE DAILY 07/03/12   [provider]  Red Yeast Rice Extract (RED YEAST RICE PO) Take by mouth 2 (two) times daily.    [provider]  saxagliptin HCl (ONGLYZA) 5 MG TABS tablet TAKE 1 TABLET BY MOUTH EVERY DAY 06/26/19   [provider]  tadalafil (CIALIS) 20 MG tablet 1 tab 60 minutes prior to intercourse 10/31/19   Stoioff, Ronda Fairly, MD  Testosterone 20.25 MG/ACT (1.62%) GEL Apply topically.    [provider]    Allergies Bee venom, Crestor [rosuvastatin], Vytorin [ezetimibe-simvastatin], and Dynapen [dicloxacillin]  Family History  Problem Relation Age of Onset  . Cancer Mother 64       colon/resection    Social History  Social History   Tobacco Use  . Smoking status: Never Smoker  . Smokeless tobacco: Never Used  Substance Use Topics  . Alcohol use: No  . Drug use: No    Review of Systems  Constitutional: No fever. Eyes: No redness. ENT: Positive for epistaxis. Cardiovascular: Denies chest pain. Respiratory: Denies shortness of breath. Gastrointestinal: No vomiting. Genitourinary: Negative for flank pain. Musculoskeletal: Negative for back pain. Skin: Negative for rash. Neurological: Negative for headache.   ____________________________________________   PHYSICAL EXAM:  VITAL SIGNS: ED Triage Vitals  Enc Vitals Group     BP 11/14/19 1615 (!) 156/98     Pulse Rate 11/14/19 1615 73     Resp 11/14/19 1615 20     Temp 11/14/19 1615 98.4 F (36.9 C)     Temp Source 11/14/19 1615 Oral     SpO2 11/14/19 1615 95 %     Weight 11/14/19 1615 231 lb (104.8 kg)     Height 11/14/19 1615 5\' 9"  (1.753 m)     Head Circumference --      Peak Flow --  Pain Score 11/14/19 1611 0     Pain Loc --      Pain Edu? --      Excl. in [place]? --     Constitutional: Alert and oriented. Well appearing and in no acute distress. Eyes: Conjunctivae are normal.  Head: Atraumatic. Nose: No congestion/rhinnorhea.  Small amount of oozing from the left nare, with an area of bleeding anteriorly on the septum. Mouth/Throat: Mucous membranes are moist.  Oropharynx clear. Neck: Normal range of motion.  Cardiovascular: Normal rate, regular rhythm.  Good peripheral circulation. Respiratory: Normal respiratory effort.  No retractions.  Gastrointestinal: No distention.  Musculoskeletal: Extremities warm and well perfused.  Neurologic:  Normal speech and language. No gross focal neurologic deficits are appreciated.  Skin:  Skin is warm and dry. No rash noted. Psychiatric: Mood and affect are normal. Speech and behavior are normal.  ____________________________________________   LABS (all labs ordered are listed,  but only abnormal results are displayed)  Labs Reviewed  BASIC METABOLIC PANEL - Abnormal; Notable for the following components:      Result Value   Glucose, Bld 125 (*)    Creatinine, Ser 1.29 (*)    GFR calc non Af Amer 54 (*)    All other components within normal limits  CBC   ____________________________________________  EKG   ____________________________________________  RADIOLOGY    ____________________________________________   PROCEDURES  Procedure(s) performed: No  Procedures  Critical Care performed: No ____________________________________________   INITIAL IMPRESSION / ASSESSMENT AND PLAN / ED COURSE  Pertinent labs & imaging results that were available during my care of the patient were reviewed by me and considered in my medical decision making (see chart for details).  74 year old male with PMH as noted above and who is on Eliquis presents with a nosebleed for the last several hours.  He denies any weakness or lightheadedness.  It has not responded to pressure at home although he has not tried other acute interventions.  On exam the patient is overall well-appearing and his vital signs are normal except for mild hypertension.  He has relatively slow oozing from a site anteriorly on the left side of the nasal septum from what I am able to visualize.  His lab work-up is reassuring.  We will spray with Afrin and apply pressure and then reassess.  ----------------------------------------- 8:11 PM on 11/14/2019 -----------------------------------------  The bleeding stopped after the Afrin and pressure.  Patient remained in the ED for approximately an hour afterwards without recurrence.  Therefore, we did not have to proceed to cautery or packing.  The patient has follow-up with ENT in 2 days.  Return precautions given and he expressed understanding.  ____________________________________________   FINAL CLINICAL IMPRESSION(S) / ED DIAGNOSES  Final  diagnoses:  Anterior epistaxis      NEW MEDICATIONS STARTED DURING THIS VISIT:  Discharge Medication List as of 11/14/2019  6:28 PM       Note:  This document was prepared using Dragon voice recognition software and may include unintentional dictation errors.    Arta Silence, MD 11/14/19 2013

## 2019-11-14 NOTE — ED Triage Notes (Signed)
Pt here for epistaxis.  Started two hours ago. Pt is on eliquis.  Hx of nose bleeds.

## 2019-11-14 NOTE — ED Notes (Signed)
ED Provider Siadecki at bedside to provide status update to pt.

## 2019-11-14 NOTE — ED Notes (Signed)
Pt denies feeling dizzy/lightheaded at this time. Pt states this is his second nosebleed today and that he's had it for about 3 hours now. Pt states he gets these blood noses regularly randomly and that he is on eloquis.

## 2019-11-14 NOTE — Discharge Instructions (Addendum)
Follow-up with the ENT on Friday as scheduled.  If the bleeding reoccurs, you should apply pressure with a clip in the area just past her nasal bone.  Continue to use the Afrin with 2 sprays into that left nostril every 12 hours.  Return to the ER if the bleeding persists despite pressure for 10 to 15 minutes, or if you have weakness or lightheadedness, difficulty breathing or swallowing, or any other new or worsening symptoms that concern you.

## 2019-11-14 NOTE — ED Notes (Signed)
Pt standing in nursing station asking to speak to EDP. Pt redirected to room 11 by this RN to assess and explain delay to pt.

## 2019-11-27 ENCOUNTER — Ambulatory Visit: Payer: Medicare Other | Admitting: Podiatry

## 2019-11-27 ENCOUNTER — Other Ambulatory Visit: Payer: Self-pay

## 2019-11-27 DIAGNOSIS — R21 Rash and other nonspecific skin eruption: Secondary | ICD-10-CM

## 2019-11-27 DIAGNOSIS — L603 Nail dystrophy: Secondary | ICD-10-CM | POA: Diagnosis not present

## 2019-11-27 DIAGNOSIS — E1149 Type 2 diabetes mellitus with other diabetic neurological complication: Secondary | ICD-10-CM

## 2019-11-27 NOTE — Progress Notes (Signed)
Subjective: 74 year old male presents the office today for follow-up evaluation of nail dystrophy and for pain in his toenails.  He was using a compound cream which is been helpful but at least needs to continue with this.  He has noticed improvement in his nails since using this.  Also developed a skin rash on top of his left foot.  He states the area was sensitive with pressure but overall improving.  Denies any open sores or any drainage.Denies any systemic complaints such as fevers, chills, nausea, vomiting. No acute changes since last appointment, and no other complaints at this time.   Objective: AAO x3, NAD DP/PT pulses palpable bilaterally, CRT less than 3 seconds Scaly erythematous skin rash on the dorsal aspect of the left foot just proximal to the MPJs there is no open lesions or skin fissures. However nails 1 to be hypertrophic and dystrophic with yellow-brown discoloration.  Nails become irritated with shoes.  No drainage or pus or signs of infection. No pain with calf compression, swelling, warmth, erythema  Assessment: Symptomatic onychomycosis, onychodystrophy; dermatitis  Plan: -All treatment options discussed with the patient including all alternatives, risks, complications.  -Nails debrided x10 without any complications or bleeding.  Continue compound cream. -Recommend steroid cream for the rash on top of the left foot.  Also suggested prescription but he has cream at home that he wants to use.  If no improvement to let me know I will order triamcinolone cream. -Patient encouraged to call the office with any questions, concerns, change in symptoms.   RTC 3 months or sooner if needed  Trula Slade DPM

## 2020-01-02 DIAGNOSIS — Z85828 Personal history of other malignant neoplasm of skin: Secondary | ICD-10-CM | POA: Insufficient documentation

## 2020-01-28 ENCOUNTER — Ambulatory Visit: Payer: Medicare Other | Admitting: Dermatology

## 2020-01-29 ENCOUNTER — Ambulatory Visit: Payer: Medicare Other | Admitting: Dermatology

## 2020-01-29 ENCOUNTER — Other Ambulatory Visit: Payer: Self-pay

## 2020-01-29 ENCOUNTER — Telehealth: Payer: Self-pay

## 2020-01-29 ENCOUNTER — Encounter: Payer: Self-pay | Admitting: Dermatology

## 2020-01-29 DIAGNOSIS — L578 Other skin changes due to chronic exposure to nonionizing radiation: Secondary | ICD-10-CM

## 2020-01-29 DIAGNOSIS — C44219 Basal cell carcinoma of skin of left ear and external auricular canal: Secondary | ICD-10-CM

## 2020-01-29 DIAGNOSIS — Z85828 Personal history of other malignant neoplasm of skin: Secondary | ICD-10-CM | POA: Diagnosis not present

## 2020-01-29 MED ORDER — MUPIROCIN 2 % EX OINT: 1.0000 "application " | TOPICAL_OINTMENT | Freq: Every day | CUTANEOUS | 1 refills | Status: DC

## 2020-01-29 NOTE — Telephone Encounter (Signed)
Pt doing fine after todays surgery.  No excessive pain or bleeding.  Pt advised to call if any problems and to rtc as scheduled on Thursday./sh

## 2020-01-29 NOTE — Patient Instructions (Signed)

## 2020-01-29 NOTE — Progress Notes (Signed)
   Follow-Up Visit   Subjective  Jeremiah Zhang is a 74 y.o. male who presents for the following: BCC bx proven (L mid sup ear helix, pt presents for excision of BCC today). He has history of BCC of the left ear inferior and high helix posterior to antitragus with positive margin.  We will plan on treating with EDC or excision in near future.    The following portions of the chart were reviewed this encounter and updated as appropriate: Tobacco  Allergies  Meds  Problems  Med Hx  Surg Hx  Fam Hx      Review of Systems: No other skin or systemic complaints.  Objective  Well appearing patient in no apparent distress; mood and affect are within normal limits.  A focused examination was performed including left ear. Relevant physical exam findings are noted in the Assessment and Plan.  Objective  L mid superior ear helix: Pink pearly papule or plaque with arborizing vessels.   Objective  L inferior antihelix post to antitragus: BCC exc site with postitive margin (excised 11/27/19)  Objective    L mid lateral nose: Well healed scar with no evidence of recurrence.   Assessment & Plan   Actinic Damage - diffuse scaly erythematous macules with underlying dyspigmentation - Recommend daily broad spectrum sunscreen SPF 30+ to sun-exposed areas, reapply every 2 hours as needed.  - Call for new or changing lesions.   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) L mid superior ear helix  mupirocin ointment (BACTROBAN) 2 %  Skin excision  Lesion length (cm):  1.5 Lesion width (cm):  1.5 Margin per side (cm):  0.2 Total excision diameter (cm):  1.9 Informed consent: discussed and consent obtained   Timeout: patient name, date of birth, surgical site, and procedure verified   Procedure prep:  Patient was prepped and draped in usual sterile fashion Prep type:  Isopropyl alcohol and povidone-iodine Anesthesia: the lesion was anesthetized in a standard fashion     Anesthetic:  1% lidocaine w/ epinephrine 1-100,000 buffered w/ 8.4% NaHCO3 (3.0cc) Instrument used: #15 blade   Hemostasis achieved with: pressure   Hemostasis achieved with comment:  Electrocautery Outcome: patient tolerated procedure well with no complications   Post-procedure details: sterile dressing applied and wound care instructions given   Dressing type: bandage and pressure dressing (Mupirocin)   Additional details:  Wound left for 2ndary intention healing.  Specimen 1 - Surgical pathology Differential Diagnosis: C44.219 BCC Check Margins: No Pink pearly papule or plaque with arborizing vessels 1.5cm Ink mark at 12:00 sup edge  L inferior antihelix post to antitragus  L inf antihelix post to antitragus - Plan EDC vs EXC in next few weeks for positive margins.  History of basal cell carcinoma (BCC) L mid lateral nose  L mid Lateral nose - s/p MOHs Clear. Observe for recurrence. Call clinic for new or changing lesions.  Recommend regular skin exams, daily broad-spectrum spf 30+ sunscreen use, and photoprotection.     Return in about 2 days (around 01/31/2020) for dressing change.   I, Othelia Pulling, RMA, am acting as scribe for [name], MD . Documentation: I have reviewed the above documentation for accuracy and completeness, and I agree with the above.  [name], MD

## 2020-01-31 ENCOUNTER — Ambulatory Visit (INDEPENDENT_AMBULATORY_CARE_PROVIDER_SITE_OTHER): Payer: Medicare Other

## 2020-01-31 ENCOUNTER — Other Ambulatory Visit: Payer: Self-pay

## 2020-01-31 DIAGNOSIS — C44219 Basal cell carcinoma of skin of left ear and external auricular canal: Secondary | ICD-10-CM

## 2020-01-31 NOTE — Progress Notes (Signed)
   Follow-Up Visit   Subjective  Jeremiah Zhang is a 74 y.o. male who presents for the following: No chief complaint on file..   The following portions of the chart were reviewed this encounter and updated as appropriate:     Review of Systems: No other skin or systemic complaints.  Objective  Well appearing patient in no apparent distress; mood and affect are within normal limits.  A focused examination was performed including left ear. Relevant physical exam findings are noted in the Assessment and Plan.  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   Healing excision site.   Area clean with Puracyn, mupirocin applied followed by telfa.  Will change dressing on follow up next Tuesday 02/05/20.  Return in about 5 days (around 02/05/2020) for surgery follow up.   I, Johnsie Kindred, RMA, am acting as scribe for [name], MD.

## 2020-02-05 ENCOUNTER — Other Ambulatory Visit: Payer: Self-pay

## 2020-02-05 ENCOUNTER — Encounter: Payer: Self-pay | Admitting: Dermatology

## 2020-02-05 ENCOUNTER — Ambulatory Visit (INDEPENDENT_AMBULATORY_CARE_PROVIDER_SITE_OTHER): Payer: Medicare Other | Admitting: Dermatology

## 2020-02-05 DIAGNOSIS — L578 Other skin changes due to chronic exposure to nonionizing radiation: Secondary | ICD-10-CM

## 2020-02-05 DIAGNOSIS — C44219 Basal cell carcinoma of skin of left ear and external auricular canal: Secondary | ICD-10-CM

## 2020-02-05 NOTE — Progress Notes (Signed)
   Follow-Up Visit   Subjective  Jeremiah Zhang is a 74 y.o. male who presents for the following: [place] exc f/u (L mid sup ear helix, 1 wk s/p excision, Margins fee).   The following portions of the chart were reviewed this encounter and updated as appropriate:      Review of Systems:  No other skin or systemic complaints except as noted in HPI or Assessment and Plan.  Objective  Well appearing patient in no apparent distress; mood and affect are within normal limits.  A focused examination was performed including L ear. Relevant physical exam findings are noted in the Assessment and Plan.  Objective  L mid sup ear  helix: Healing excision site.   Assessment & Plan   Actinic Damage - diffuse scaly erythematous macules with underlying dyspigmentation - Recommend daily broad spectrum sunscreen SPF 30+ to sun-exposed areas, reapply every 2 hours as needed.  - Call for new or changing lesions.   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 Continue wound care q 3 days with mupirocin. Pt advised of pathology  Other Related Medications mupirocin ointment (BACTROBAN) 2 %  Return in about 3 weeks (around 02/26/2020) for recheck L ear.    Documentation: I have reviewed the above documentation for accuracy and completeness, and I agree with the above.  [name], MD  I, Othelia Pulling, RMA, am acting as scribe for [name], MD .

## 2020-02-13 ENCOUNTER — Ambulatory Visit: Payer: Medicare Other | Admitting: Dermatology

## 2020-02-13 ENCOUNTER — Other Ambulatory Visit: Payer: Self-pay

## 2020-02-13 ENCOUNTER — Ambulatory Visit (INDEPENDENT_AMBULATORY_CARE_PROVIDER_SITE_OTHER): Payer: Medicare Other

## 2020-02-13 DIAGNOSIS — C44219 Basal cell carcinoma of skin of left ear and external auricular canal: Secondary | ICD-10-CM | POA: Diagnosis not present

## 2020-02-13 NOTE — Progress Notes (Signed)
   Follow-Up Visit   Subjective  Jeremiah Zhang is a 74 y.o. male who presents for the following: dressing change and wound check to left ear with nurse.   The following portions of the chart were reviewed this encounter and updated as appropriate:     Review of Systems: No other skin or systemic complaints.  Objective  Well appearing patient in no apparent distress; mood and affect are within normal limits.  A focused examination was performed including left ear. Relevant physical exam findings are noted in the Assessment and Plan.  Objective  Left Ear: Post op excision, 15 days out.  Patient complaining of nerve pain down the left side of his cheek. He did have a dental procedure done last week. Advised patient to contact dentist who did procedure to let them know and watch for any signs of signals and to call office ASAP.    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    Area clean with Puracyn, mupirocin applied followed by telfa.    Other Related Medications mupirocin ointment (BACTROBAN) 2 %   Return in about 2 weeks (around 02/27/2020) for excision follow up.

## 2020-02-26 ENCOUNTER — Ambulatory Visit: Payer: Medicare Other | Admitting: Podiatry

## 2020-02-26 ENCOUNTER — Other Ambulatory Visit: Payer: Self-pay

## 2020-02-26 DIAGNOSIS — M79675 Pain in left toe(s): Secondary | ICD-10-CM | POA: Diagnosis not present

## 2020-02-26 DIAGNOSIS — E1149 Type 2 diabetes mellitus with other diabetic neurological complication: Secondary | ICD-10-CM

## 2020-02-26 DIAGNOSIS — B351 Tinea unguium: Secondary | ICD-10-CM | POA: Diagnosis not present

## 2020-02-26 DIAGNOSIS — M79674 Pain in right toe(s): Secondary | ICD-10-CM

## 2020-02-27 ENCOUNTER — Ambulatory Visit: Payer: Medicare Other | Admitting: Dermatology

## 2020-02-27 DIAGNOSIS — Z85828 Personal history of other malignant neoplasm of skin: Secondary | ICD-10-CM

## 2020-02-27 DIAGNOSIS — L578 Other skin changes due to chronic exposure to nonionizing radiation: Secondary | ICD-10-CM

## 2020-02-27 DIAGNOSIS — C44219 Basal cell carcinoma of skin of left ear and external auricular canal: Secondary | ICD-10-CM

## 2020-02-27 NOTE — Progress Notes (Signed)
   Follow-Up Visit   Subjective  Jeremiah Zhang is a 74 y.o. male who presents for the following: recheck BCC (L mid sup ear helix - treated by excision/secondary intention healing on 01/29/20) and BCC (L inf anti helix post to anti tragus - recheck today but plan to treat in the future ).  The following portions of the chart were reviewed this encounter and updated as appropriate:  Tobacco  Allergies  Meds  Problems  Med Hx  Surg Hx  Fam Hx     Review of Systems:  No other skin or systemic complaints except as noted in HPI or Assessment and Plan.  Objective  Well appearing patient in no apparent distress; mood and affect are within normal limits.  A focused examination was performed including the left ear. Relevant physical exam findings are noted in the Assessment and Plan.  Objective  L inf anti helix post to anti tragus: Healing biopsy site  Objective  L mid sup ear helix: Well healed scar with no evidence of recurrence.   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 Observe for recurrence. Call clinic for new or changing lesions.  Recommend regular skin exams, daily broad-spectrum spf 30+ sunscreen use, and photoprotection.  Discussed reconstruction (in the future) of the rim if bothersome to patient.   Actinic Damage - diffuse scaly erythematous macules with underlying dyspigmentation - Recommend daily broad spectrum sunscreen SPF 30+ to sun-exposed areas, reapply every 2 hours as needed.  - Call for new or changing lesions.  Return in about 1 month (around 03/29/2020) for tx [facility] with ED&C, recheck L mid sup ear helix hx BCC site.  Luther Redo, CMA, am acting as scribe for [name], MD . Documentation: I have reviewed the  above documentation for accuracy and completeness, and I agree with the above.  [name], MD

## 2020-02-27 NOTE — Progress Notes (Signed)
Subjective: 74 y.o. returns the office today for painful, elongated, thickened toenails which he cannot trim himself. Denies any redness or drainage around the nails.  Asking if nail removal would make the nail grow back are normal.  He has stopped using the compound cream for nail fungus.  The skin rash they have previously resolved.  Denies any acute changes since last appointment and no new complaints today. Denies any systemic complaints such as fevers, chills, nausea, vomiting.   PCP: Jeremiah Late, MD  Objective: AAO 3, NAD DP/PT pulses palpable, CRT less than 3 seconds Nails hypertrophic, dystrophic, elongated, brittle, discolored 10. There is tenderness overlying the nails 1-5 bilaterally. There is no surrounding erythema or drainage along the nail sites. No open lesions or pre-ulcerative lesions are identified. No other areas of tenderness bilateral lower extremities. No overlying edema, erythema, increased warmth. No pain with calf compression, swelling, warmth, erythema.  Assessment: Patient presents with symptomatic onychomycosis  Plan: -Treatment options including alternatives, risks, complications were discussed -Nails sharply debrided 10 without complication/bleeding.  He is to continue the compound cream.  Discussed nail removal is not a guarantee so he was to hold off on that. -Discussed daily foot inspection. If there are any changes, to call the office immediately.  -Follow-up in 3 months or sooner if any problems are to arise. In the meantime, encouraged to call the office with any questions, concerns, changes symptoms.  Celesta Gentile, DPM

## 2020-03-05 ENCOUNTER — Encounter: Payer: Self-pay | Admitting: Dermatology

## 2020-04-03 ENCOUNTER — Other Ambulatory Visit: Payer: Self-pay

## 2020-04-03 ENCOUNTER — Ambulatory Visit: Payer: Medicare Other | Admitting: Dermatology

## 2020-04-03 DIAGNOSIS — Z85828 Personal history of other malignant neoplasm of skin: Secondary | ICD-10-CM

## 2020-04-03 DIAGNOSIS — L578 Other skin changes due to chronic exposure to nonionizing radiation: Secondary | ICD-10-CM | POA: Diagnosis not present

## 2020-04-03 DIAGNOSIS — C44219 Basal cell carcinoma of skin of left ear and external auricular canal: Secondary | ICD-10-CM

## 2020-04-03 NOTE — Progress Notes (Signed)
   Follow-Up Visit   Subjective  Jeremiah Zhang is a 74 y.o. male who presents for the following: BCC (patient is here today for [facility] of the L inf anti helix post to anti tragus).  The following portions of the chart were reviewed this encounter and updated as appropriate:  Tobacco  Allergies  Meds  Problems  Med Hx  Surg Hx  Fam Hx     Review of Systems:  No other skin or systemic complaints except as noted in HPI or Assessment and Plan.  Objective  Well appearing patient in no apparent distress; mood and affect are within normal limits.  A focused examination was performed including the left ear. Relevant physical exam findings are noted in the Assessment and Plan.  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 - superficial Biopsy proven - residual at 12 o'clock edge from previous excision Left inf anti helix post to anti tragus  Destruction of lesion Complexity: extensive   Destruction method: electrodesiccation and curettage   Informed consent: discussed and consent obtained   Timeout:  patient name, date of birth, surgical site, and procedure verified Procedure prep:  Patient was prepped and draped in usual sterile fashion Prep type:  Isopropyl alcohol Anesthesia: the lesion was anesthetized in a standard fashion   Anesthetic:  1% lidocaine w/ epinephrine 1-100,000 buffered w/ 8.4% NaHCO3 Curettage performed in three different directions: Yes   Electrodesiccation performed over the curetted area: Yes   Lesion length (cm):  1.2 Lesion width (cm):  1.2 Margin per side (cm):  0.2 Final wound size (cm):  1.6 Hemostasis achieved with:  pressure, aluminum chloride and electrodesiccation Outcome: patient tolerated procedure well with no complications   Post-procedure details: sterile dressing applied and wound care instructions given   Dressing type: bandage and petrolatum     Other Related Medications mupirocin ointment (BACTROBAN) 2 %    Actinic Damage - diffuse scaly erythematous macules with underlying dyspigmentation - Recommend daily broad spectrum sunscreen SPF 30+ to sun-exposed areas, reapply every 2 hours as needed.  - Call for new or changing lesions.  History of Basal Cell Carcinoma of the Skin - No evidence of recurrence today - Recommend regular full body skin exams - Recommend daily broad spectrum sunscreen SPF 30+ to sun-exposed areas, reapply every 2 hours as needed.  - Call if any new or changing lesions are noted between office visits   Return in about 6 months (around 10/03/2020) for TBSE.  Luther Redo, CMA, am acting as scribe for [name], MD .  Documentation: I have reviewed the above documentation for accuracy and completeness, and I agree with the above.  [name], MD

## 2020-04-03 NOTE — Patient Instructions (Signed)

## 2020-04-06 ENCOUNTER — Encounter: Payer: Self-pay | Admitting: Dermatology

## 2020-04-28 ENCOUNTER — Other Ambulatory Visit: Payer: Self-pay

## 2020-04-28 ENCOUNTER — Other Ambulatory Visit: Payer: Medicare Other

## 2020-04-28 DIAGNOSIS — C61 Malignant neoplasm of prostate: Secondary | ICD-10-CM

## 2020-04-28 DIAGNOSIS — E291 Testicular hypofunction: Secondary | ICD-10-CM

## 2020-04-29 ENCOUNTER — Other Ambulatory Visit: Payer: Self-pay

## 2020-04-29 ENCOUNTER — Telehealth: Payer: Self-pay | Admitting: *Deleted

## 2020-04-29 LAB — TESTOSTERONE: Testosterone: 250 ng/dL — ABNORMAL LOW (ref 264–916)

## 2020-04-29 LAB — PSA: Prostate Specific Ag, Serum: <0.1 ng/mL (ref 0.0–4.0)

## 2020-04-29 LAB — HEMATOCRIT: Hematocrit: 42.1 % (ref 37.5–51.0)

## 2020-04-29 NOTE — Telephone Encounter (Signed)
Notified patient as instructed, patient pleased. Discussed follow-up appointments, patient agrees  

## 2020-04-29 NOTE — Telephone Encounter (Signed)
-----   Message from Abbie Sons, MD sent at 04/29/2020 12:48 PM EDT ----- Testosterone level was 250, hematocrit normal 42.1 and PSA remains undetectable at <0.1.  Follow-up as scheduled

## 2020-05-29 ENCOUNTER — Other Ambulatory Visit: Payer: Self-pay

## 2020-05-29 ENCOUNTER — Ambulatory Visit: Payer: Medicare Other | Admitting: Podiatry

## 2020-05-29 DIAGNOSIS — M21619 Bunion of unspecified foot: Secondary | ICD-10-CM

## 2020-05-29 DIAGNOSIS — B351 Tinea unguium: Secondary | ICD-10-CM

## 2020-05-29 DIAGNOSIS — M79675 Pain in left toe(s): Secondary | ICD-10-CM | POA: Diagnosis not present

## 2020-05-29 DIAGNOSIS — M79674 Pain in right toe(s): Secondary | ICD-10-CM

## 2020-05-29 DIAGNOSIS — E1149 Type 2 diabetes mellitus with other diabetic neurological complication: Secondary | ICD-10-CM

## 2020-05-29 NOTE — Progress Notes (Signed)
Subjective: 74 y.o. returns the office today for painful, elongated, thickened toenails which he cannot trim himself.  No redness or drainage or signs of infection noted.  Some occasional discomfort to the big toe joint but otherwise no other areas of discomfort.  No recent injury or falls. Denies any systemic complaints such as fevers, chills, nausea, vomiting.   PCP: [name], MD  Objective: AAO 3, NAD DP/PT pulses palpable, CRT less than 3 seconds Nails hypertrophic, dystrophic, elongated, brittle, discolored 10. There is tenderness overlying the nails 1-5 bilaterally. There is no surrounding erythema or drainage along the nail sites. No open lesions or pre-ulcerative lesions are identified. Continue present personally to laboratory changes present left foot worse than right.  No pain today.  No edema.  No skin breakdown. No other areas of tenderness bilateral lower extremities. No overlying edema, erythema, increased warmth. No pain with calf compression, swelling, warmth, erythema.  Assessment: Patient presents with symptomatic onychomycosis; bunion  Plan: -Treatment options including alternatives, risks, complications were discussed -Nails sharply debrided 10 without complication/bleeding.   -Discussed shoe modification of painful bunion.  Moderate skin irritation or breakdown. -Discussed daily foot inspection. If there are any changes, to call the office immediately.  -Follow-up in 3 months or sooner if any problems are to arise. In the meantime, encouraged to call the office with any questions, concerns, changes symptoms.  Celesta Gentile, DPM

## 2020-05-29 NOTE — Patient Instructions (Signed)
Diabetes Mellitus and Foot Care Foot care is an important part of your health, especially when you have diabetes. Diabetes may cause you to have problems because of poor blood flow (circulation) to your feet and legs, which can cause your skin to:  Become thinner and drier.  Break more easily.  Heal more slowly.  Peel and crack. You may also have nerve damage (neuropathy) in your legs and feet, causing decreased feeling in them. This means that you may not notice minor injuries to your feet that could lead to more serious problems. Noticing and addressing any potential problems early is the best way to prevent future foot problems. How to care for your feet Foot hygiene  Wash your feet daily with warm water and mild soap. Do not use hot water. Then, pat your feet and the areas between your toes until they are completely dry. Do not soak your feet as this can dry your skin.  Trim your toenails straight across. Do not dig under them or around the cuticle. File the edges of your nails with an emery board or nail file.  Apply a moisturizing lotion or petroleum jelly to the skin on your feet and to dry, brittle toenails. Use lotion that does not contain alcohol and is unscented. Do not apply lotion between your toes. Shoes and socks  Wear clean socks or stockings every day. Make sure they are not too tight. Do not wear knee-high stockings since they may decrease blood flow to your legs.  Wear shoes that fit properly and have enough cushioning. Always look in your shoes before you put them on to be sure there are no objects inside.  To break in new shoes, wear them for just a few hours a day. This prevents injuries on your feet. Wounds, scrapes, corns, and calluses  Check your feet daily for blisters, cuts, bruises, sores, and redness. If you cannot see the bottom of your feet, use a mirror or ask someone for help.  Do not cut corns or calluses or try to remove them with medicine.  If you  find a minor scrape, cut, or break in the skin on your feet, keep it and the skin around it clean and dry. You may clean these areas with mild soap and water. Do not clean the area with peroxide, alcohol, or iodine.  If you have a wound, scrape, corn, or callus on your foot, look at it several times a day to make sure it is healing and not infected. Check for: ? Redness, swelling, or pain. ? Fluid or blood. ? Warmth. ? Pus or a bad smell. General instructions  Do not cross your legs. This may decrease blood flow to your feet.  Do not use heating pads or hot water bottles on your feet. They may burn your skin. If you have lost feeling in your feet or legs, you may not know this is happening until it is too late.  Protect your feet from hot and cold by wearing shoes, such as at the beach or on hot pavement.  Schedule a complete foot exam at least once a year (annually) or more often if you have foot problems. If you have foot problems, report any cuts, sores, or bruises to your health care provider immediately. Contact a health care provider if:  You have a medical condition that increases your risk of infection and you have any cuts, sores, or bruises on your feet.  You have an injury that is not   healing.  You have redness on your legs or feet.  You feel burning or tingling in your legs or feet.  You have pain or cramps in your legs and feet.  Your legs or feet are numb.  Your feet always feel cold.  You have pain around a toenail. Get help right away if:  You have a wound, scrape, corn, or callus on your foot and: ? You have pain, swelling, or redness that gets worse. ? You have fluid or blood coming from the wound, scrape, corn, or callus. ? Your wound, scrape, corn, or callus feels warm to the touch. ? You have pus or a bad smell coming from the wound, scrape, corn, or callus. ? You have a fever. ? You have a red line going up your leg. Summary  Check your feet every day  for cuts, sores, red spots, swelling, and blisters.  Moisturize feet and legs daily.  Wear shoes that fit properly and have enough cushioning.  If you have foot problems, report any cuts, sores, or bruises to your health care provider immediately.  Schedule a complete foot exam at least once a year (annually) or more often if you have foot problems. This information is not intended to replace advice given to you by your health care provider. Make sure you discuss any questions you have with your health care provider. Document Revised: 06/27/2019 Document Reviewed: 11/05/2016 Elsevier Patient Education  2020 Elsevier Inc.  

## 2020-08-11 NOTE — Progress Notes (Signed)
[facility]    Provider:  Dr Jaynee Eagles Requesting Provider: Diona Browner, DDS Primary Care Provider:  [name], MD  CC:  Face numbness and tingling and pain  HPI:  Jeremiah Zhang is a 74 y.o. male here as requested by Diona Browner, DDS for tingling of the left side of face and pain after chewing.  Past medical history high blood pressure, diabetes, hypercholesterolemia, obese, peripheral neuropathy, TIA, GERD, DVT, arthritis, basal cell carcinoma.  I reviewed Dr. Lupita Leash notes, patient underwent root canal treatment approximately 6 to 8 weeks prior to August 17, shortly afterwards had removal of a tooth, tingling in the left side of the face from the temple region to the neck, has been slowly getting less and less bothersome, also reported jaw discomfort on the left after eating a chewing meal, denied numbness sharp pain muscle drooping malocclusion.  Dr. Lupita Leash exam showed no clicking popping and normal range of motion of TMJ, well-healed extraction of tooth, healthy dentition and periodontal tissue, no exposed bone, edema fluctuance purulence, clear pharynx, inferior alveolar nerve well below root apices of 18 and 19, sinus is clear, diagnosed with left trigeminal neuralgia slowly resolving.   Patient is here alone, reports left facial tingling, got better than it got worse, started with a triple root canal, he had an infection with another tooth, got the tooth pulled and developed tingling that reached the shoulder, also noticed balance issues lately that come and go, tingling is not gone but it is reduced. He had a tooth ace, turned out that his dentist sent him to a specialist and did a triple root canal, then he went back to the dentist to put a filling in where he drilled through the crown, then noticed the tooth behind it had an infection and that needed to be pulled and following that he would get the tingling sensation from is ear down his cheek. That has 90%  subsided but occassionally still there he has tingling in the left cheek. Most recently he would do things like open the fridge and turn to go to the counter and suddenly just start falling has happened more than once, he is walking down the hallway and he starts drifting and bumps into the wells with his balance. He feels it is associated with the tingling.    Reviewed notes, labs and imaging from outside physicians, which showed:  09/07/2015: personally reviewed images and agree with the following:  No mass lesion. No midline shift. No acute hemorrhage or hematoma. No extra-axial fluid collections. No evidence of acute infarction. Brain parenchyma is normal. Osseous structures are normal.  IMPRESSION: Normal exam.  BMP normal 11/14/2019  Review of Systems: Patient complains of symptoms per HPI as well as the following symptoms: imbalance. Pertinent negatives and positives per HPI. All others negative.   Social History   Socioeconomic History  . Marital status: Married    Spouse name: Not on file  . Number of children: 2  . Years of education: Not on file  . Highest education level: Doctorate  Occupational History  . Not on file  Tobacco Use  . Smoking status: Never Smoker  . Smokeless tobacco: Never Used  Substance and Sexual Activity  . Alcohol use: Never  . Drug use: Never  . Sexual activity: Not Currently  Other Topics Concern  . Not on file  Social History Narrative   Lives at home with wife   Caffeine: pepsi   Social Determinants of Health  Financial Resource Strain:   . Difficulty of Paying Living Expenses: Not on file  Food Insecurity:   . Worried About Charity fundraiser in the Last Year: Not on file  . Ran Out of Food in the Last Year: Not on file  Transportation Needs:   . Lack of Transportation (Medical): Not on file  . Lack of Transportation (Non-Medical): Not on file  Physical Activity:   . Days of Exercise per Week: Not on file  . Minutes of  Exercise per Session: Not on file  Stress:   . Feeling of Stress : Not on file  Social Connections:   . Frequency of Communication with Friends and Family: Not on file  . Frequency of Social Gatherings with Friends and Family: Not on file  . Attends Religious Services: Not on file  . Active Member of Clubs or Organizations: Not on file  . Attends Archivist Meetings: Not on file  . Marital Status: Not on file  Intimate Partner Violence:   . Fear of Current or Ex-Partner: Not on file  . Emotionally Abused: Not on file  . Physically Abused: Not on file  . Sexually Abused: Not on file    Family History  Problem Relation Age of Onset  . Cancer Mother 80       colon/resection    Past Medical History:  Diagnosis Date  . Actinic keratosis   . Anticoagulant long-term use    ELIQUIS  . Arthritis   . Basal cell carcinoma 01/27/2010   Right spinal upper back. Superficial.   . Basal cell carcinoma 11/15/2019   Left inferior antihelix post. to tragus. Superficial and nodular patterns. Excised: 11/27/2019  . Basal cell carcinoma 11/15/2019   Left mid lateral nose. Nodular and infiltrative patterns. Excised: 11/27/2019  . Basal cell carcinoma 11/27/2019   Left mid superior helix. BCC with focal sclerosis.  . Basal cell carcinoma 01/29/2020   Left mid superior ear helix  . 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 Slight atypia. Edges free.  . ED (erectile dysfunction)   . GERD (gastroesophageal reflux disease)   . Hiatal hernia   . History of DVT of lower extremity    POST OP KNEE SURGERY  . History of transient ischemic attack (TIA)    PER PT HX  ?POSSIBLE-- PT ON ELIQUIS  . Hyperlipidemia   . Hypertension   . Hypogonadism male   . OSA (obstructive sleep apnea)    per pt positive sleep study yrs ago recommended CPAP --  pt Non- compliant -- states uses Breath Rite Nasal Strips  . Peripheral neuropathy   . Prostate cancer [facility]) urologist-  dr tannenbaum/  oncologist--  dr [name]   dx 03-25-2015  T1c, Gleason 4+3  s/p  cryoablation prostate 06-02-2015/  external  radiation therapy 08-16-2016 to 09-20-2016/  scheduled for radioactive seed implants 10-22-2016  . TIA (transient ischemic attack)    ?? 08/20/2014? Eliquis 07/02/15  . Type 2 diabetes mellitus ([place])    followed by dr [name] [facility] clinic)  last A1c 8.4 on 07-27-2016 per lov note  . Umbilical hernia   . Wears glasses   . Wears hearing aid    bilateral    Patient Active Problem List   Diagnosis Date Noted  . Personal history of other malignant neoplasm of skin 01/02/2020  . GERD (gastroesophageal reflux disease) 08/28/2019  . Hyperglycemia 08/28/2019  . Hyperlipidemia 08/28/2019  . Hypertension 08/28/2019  . Obesity 08/28/2019  . Myalgia  due to statin 12/28/2018  . Hemorrhoids, internal, with bleeding 11/16/2018  . Erectile dysfunction following radiation therapy 08/10/2018  . Hypogonadism in male 05/11/2018  . Diabetes mellitus without complication ([place]) 31/51/7616  . Prostate cancer ([place]) 07/14/2016  . Status post rotator cuff repair 01/05/2015  . Encounter for long-term (current) use of other medications 01/15/2014  . Benign prostatic hyperplasia without lower urinary tract symptoms 06/27/2012    Past Surgical History:  Procedure Laterality Date  . COLONOSCOPY WITH PROPOFOL N/A 05/30/2017   Procedure: COLONOSCOPY WITH PROPOFOL;  Surgeon: [name], MD;  Location: [facility] ENDOSCOPY;  Service: Endoscopy;  Laterality: N/A;  . CRYOABLATION N/A 06/02/2015   Procedure: CRYO ABLATION PROSTATE;  Surgeon: Carolan Clines, MD;  Location: [facility];  Service: Urology;  Laterality: N/A;  . CYSTOSCOPY WITH URETHRAL DILATATION N/A 05/04/2019   Procedure: CYSTOSCOPY WITH URETHRAL DILATATION;  Surgeon: [name], MD;  Location: [facility];  Service: Urology;  Laterality: N/A;  . ELBOW BURSA SURGERY Left Feb 1997  . prostate biopsies     x12,  cryotherapy  . RADIOACTIVE SEED IMPLANT N/A 10/22/2016   Procedure: RADIOACTIVE SEED IMPLANT/BRACHYTHERAPY IMPLANT, URETHRAL STRICTURE DIALIATION;  Surgeon: Carolan Clines, MD;  Location: [place];  Service: Urology;  Laterality: N/A;  . SHOULDER OPEN ROTATOR CUFF REPAIR Left 09-04-2014  . skin cancer L ear     Dr [name]   . TOTAL KNEE ARTHROPLASTY Bilateral right 08-13-2008  &  left 11-14-2008  . UMBILICAL HERNIA REPAIR  06-23-2012  . ureter stretch  05/02/2019   [facility] Dr [name]    Current Outpatient Medications  Medication Sig Dispense Refill  . ACCU-CHEK FASTCLIX LANCETS MISC   0  . Acetaminophen (TYLENOL PO) Take 2-3 tablets by mouth as needed.    Marland Kitchen amLODipine (NORVASC) 5 MG tablet TAKE 1 TABLET(5 MG) BY MOUTH EVERY DAY    . apixaban (ELIQUIS) 5 MG TABS tablet TAKE 1 TABLET(5 MG) BY MOUTH TWICE DAILY    . ascorbic acid (VITAMIN C) 500 MG tablet Take 1,000 mg by mouth daily.    . Aspirin-Acetaminophen-Caffeine (EXCEDRIN PO) Take 2-3 tablets by mouth. Rarely as needed for headache. Once in 3 mo max. +2 more in 1/2 if a severe headache.    . bisoprolol-hydrochlorothiazide (ZIAC) 10-6.25 MG tablet TAKE 1 TABLET BY MOUTH EVERY DAY    . Cholecalciferol (VITAMIN D3) 2000 UNITS TABS Take 1 capsule by mouth daily.    Marland Kitchen EPINEPHrine 0.3 mg/0.3 mL IJ SOAJ injection Inject into the muscle as needed.    Marland Kitchen EPINEPHrine 0.3 mg/0.3 mL IJ SOAJ injection INJECT 0.3 MG INTO THE MUSCLE ONCE AS NEEDED FOR ANAPHYLAXIS    . esomeprazole (NEXIUM) 40 MG capsule Take 40 mg by mouth every evening.     . furosemide (LASIX) 40 MG tablet Take 40 mg by mouth daily.     Marland Kitchen glimepiride (AMARYL) 2 MG tablet TAKE 1 TABLET BY MOUTH TWICE DAILY    . Ibuprofen (ADVIL PO) Take 2 tablets by mouth as needed.    . metFORMIN (GLUCOPHAGE) 1000 MG tablet TAKE 1 TABLET(1000 MG) BY MOUTH TWICE DAILY WITH MEALS    . Naproxen Sodium (ALEVE PO) Take 2-3 tablets by mouth as needed.    . quinapril  (ACCUPRIL) 20 MG tablet TAKE 1 TABLET BY MOUTH TWICE DAILY    . Red Yeast Rice Extract (RED YEAST RICE PO) Take by mouth 2 (two) times daily.    . sitaGLIPtin (JANUVIA) 100  MG tablet Take by mouth.    . Testosterone 20.25 MG/ACT (1.62%) GEL Apply topically.    Marland Kitchen glucose blood test strip TEST TWICE DAILY AS INSTRUCTED     No current facility-administered medications for this visit.   Facility-Administered Medications Ordered in Other Visits  Medication Dose Route Frequency Provider Last Rate Last Admin  . acetaminophen (TYLENOL) tablet 975 mg  975 mg Oral Q6H PRN Carolan Clines, MD        Allergies as of 08/12/2020 - Review Complete 08/12/2020  Allergen Reaction Noted  . Bee venom Anaphylaxis 05/26/2015  . Crestor [rosuvastatin] Other (See Comments) 05/26/2015  . Vytorin [ezetimibe-simvastatin] Other (See Comments) 05/26/2015  . Dynapen [dicloxacillin] Rash 05/26/2015    Vitals: BP 106/71 (BP Location: Right Arm, Patient Position: Sitting)   Pulse 80   Wt 226 lb (102.5 kg)   BMI 33.37 kg/m  Last Weight:  Wt Readings from Last 1 Encounters:  08/12/20 226 lb (102.5 kg)   Last Height:   Ht Readings from Last 1 Encounters:  11/14/19 5\' 9"  (1.753 m)     Physical exam: Exam: Gen: NAD, conversant, well nourised, obese, well groomed                     CV: RRR, no MRG. No Carotid Bruits. No peripheral edema, warm, nontender Eyes: Conjunctivae clear without exudates or hemorrhage  Neuro: Detailed Neurologic Exam  Speech:    Speech is normal; fluent and spontaneous with normal comprehension.  Cognition:    The patient is oriented to person, place, and time;     recent and remote memory intact;     language fluent;     normal attention, concentration,     fund of knowledge Cranial Nerves:    The pupils are equal, round, and reactive to light. Pupils too small to visualize fundi. Visual fields are full to finger confrontation. Extraocular movements are intact.  Trigeminal sensation is intact and the muscles of mastication are normal. The face is symmetric. The palate elevates in the midline. Hearing intact. Voice is normal. Shoulder shrug is normal. The tongue has normal motion without fasciculations.   Coordination:    No dysmetria or ataxia of limbs and FTN  Gait:    Heel-toe and tandem gait are normal.   Motor Observation:    No asymmetry, no atrophy, and no involuntary movements noted. Tone:    Normal muscle tone.    Posture:    Posture is normal. normal erect    Strength:    Strength is V/V in the upper and lower limbs.      Sensation: intact to LT     Reflex Exam:  DTR's:    Deep tendon reflexes in the upper and lower extremities are normal bilaterally.  The left Patellar is slightly reduced as compared to the right.  Toes:    The toes are downgoing bilaterally.   Clonus:    Clonus is absent.    Assessment/Plan:   74 y.o. male here as requested by Diona Browner, DDS for tingling of the left side of face and pain after chewing. Happened after tooth infection likely irritation of the trigeminal nerve due to dental etiology. However he has also had imbalance and falls since then so need to check MRI of the brain to ensure no infiltration of infection, abscess of the brain or other etiology for his reported ataxia after tooth infection.   Orders Placed This Encounter  Procedures  . MR BRAIN  W WO CONTRAST  . Basic Metabolic Panel    Cc: Diona Browner, DDS,  [name], MD  Sarina Ill, MD  [facility] [address] [place], [place] [address]  Phone [phone] Fax [phone]

## 2020-08-12 ENCOUNTER — Encounter: Payer: Self-pay | Admitting: Neurology

## 2020-08-12 ENCOUNTER — Ambulatory Visit (INDEPENDENT_AMBULATORY_CARE_PROVIDER_SITE_OTHER): Payer: Medicare Other | Admitting: Neurology

## 2020-08-12 ENCOUNTER — Telehealth: Payer: Self-pay | Admitting: Neurology

## 2020-08-12 VITALS — BP 106/71 | HR 80 | Wt 226.0 lb

## 2020-08-12 DIAGNOSIS — W19XXXA Unspecified fall, initial encounter: Secondary | ICD-10-CM

## 2020-08-12 DIAGNOSIS — R200 Anesthesia of skin: Secondary | ICD-10-CM

## 2020-08-12 DIAGNOSIS — R270 Ataxia, unspecified: Secondary | ICD-10-CM

## 2020-08-12 DIAGNOSIS — R2689 Other abnormalities of gait and mobility: Secondary | ICD-10-CM

## 2020-08-12 DIAGNOSIS — G500 Trigeminal neuralgia: Secondary | ICD-10-CM | POA: Diagnosis not present

## 2020-08-12 DIAGNOSIS — R202 Paresthesia of skin: Secondary | ICD-10-CM

## 2020-08-12 NOTE — Patient Instructions (Signed)
MRI of the brain w/wo contrast   Trigeminal Neuralgia  Trigeminal neuralgia is a nerve disorder that causes severe pain on one side of the face. The pain may last from a few seconds to several minutes. The pain is usually only on one side of the face. Symptoms may occur for days, weeks, or months and then go away for months or years. The pain may return and be worse than before. What are the causes? This condition is caused by damage or pressure to a nerve in the head that is called the trigeminal nerve. An attack can be triggered by:  Talking.  Chewing.  Putting on makeup.  Washing your face.  Shaving your face.  Brushing your teeth.  Touching your face. What increases the risk? You are more likely to develop this condition if you:  Are 76 years of age or older.  Are male. What are the signs or symptoms? The main symptom of this condition is severe pain in the:  Jaw.  Lips.  Eyes.  Nose.  Scalp.  Forehead.  Face. The pain may be:  Intense.  Stabbing.  Electric.  Shock-like. How is this diagnosed? This condition is diagnosed with a physical exam. A CT scan or an MRI may be done to rule out other conditions that can cause facial pain. How is this treated? This condition may be treated with:  Avoiding the things that trigger your symptoms.  Taking prescription medicines (anticonvulsants).  Having surgery. This may be done in severe cases if other medical treatment does not provide relief.  Having procedures such as ablation, thermal, or radiation therapy. It may take up to one month for treatment to start relieving the pain. Follow these instructions at home: Managing pain  Learn as much as you can about how to manage your pain. Ask your health care provider if a pain specialist would be helpful.  Consider talking with a mental health care provider (psychologist) about how to cope with the pain.  Consider joining a pain support group. General  instructions  Take over-the-counter and prescription medicines only as told by your health care provider.  Avoid the things that trigger your symptoms. It may help to: ? Chew on the unaffected side of your mouth. ? Avoid touching your face. ? Avoid blasts of hot or cold air.  Follow your treatment plan as told by your health care provider. This may include: ? Cognitive or behavioral therapy. ? Gentle, regular exercise. ? Meditation or yoga. ? Aromatherapy.  Keep all follow-up visits as told by your health care provider. You may need to be monitored closely to make sure treatment is working well for you. Where to find more information  Facial Pain Association: fpa-support.org Contact a health care provider if:  Your medicine is not helping your symptoms.  You have side effects from the medicine used for treatment.  You develop new, unexplained symptoms, such as: ? Double vision. ? Facial weakness. ? Facial numbness. ? Changes in hearing or balance.  You feel depressed. Get help right away if:  Your pain is severe and is not getting better.  You develop suicidal thoughts. If you ever feel like you may hurt yourself or others, or have thoughts about taking your own life, get help right away. You can go to your nearest emergency department or call:  Your local emergency services (911 in the [place]  A suicide crisis helpline, such as the [place] at (909)101-0370. This is open 24 hours  a day. Summary  Trigeminal neuralgia is a nerve disorder that causes severe pain on one side of the face. The pain may last from a few seconds to several minutes.  This condition is caused by damage or pressure to a nerve in the head that is called the trigeminal nerve.  Treatment may include avoiding the things that trigger your symptoms, taking medicines, or having surgery or procedures. It may take up to one month for treatment to start relieving the  pain.  Avoid the things that trigger your symptoms.  Keep all follow-up visits as told by your health care provider. You may need to be monitored closely to make sure treatment is working well for you. This information is not intended to replace advice given to you by your health care provider. Make sure you discuss any questions you have with your health care provider. Document Revised: 08/21/2018 Document Reviewed: 08/21/2018 Elsevier Patient Education  [place].

## 2020-08-12 NOTE — Telephone Encounter (Signed)
[employer] medicare/bcbs Josem Kaufmann: [id number] (exp. 08/12/20 to 02/07/21) order sent to [facility]. They will reach out to the patient to schedule.

## 2020-08-13 ENCOUNTER — Telehealth: Payer: Self-pay | Admitting: *Deleted

## 2020-08-13 LAB — BASIC METABOLIC PANEL
BUN/Creatinine Ratio: 17 (ref 10–24)
BUN: 39 mg/dL — ABNORMAL HIGH (ref 8–27)
CO2: 25 mmol/L (ref 20–29)
Calcium: 9.2 mg/dL (ref 8.6–10.2)
Chloride: 96 mmol/L (ref 96–106)
Creatinine, Ser: 2.23 mg/dL — ABNORMAL HIGH (ref 0.76–1.27)
GFR calc Af Amer: 32 mL/min/{1.73_m2} — ABNORMAL LOW (ref >59)
GFR calc non Af Amer: 28 mL/min/{1.73_m2} — ABNORMAL LOW (ref >59)
Glucose: 248 mg/dL — ABNORMAL HIGH (ref 65–99)
Potassium: 3.8 mmol/L (ref 3.5–5.2)
Sodium: 141 mmol/L (ref 134–144)

## 2020-08-13 NOTE — Telephone Encounter (Signed)
I called the pt and discussed the lab results per Dr Jaynee Eagles. I advised the pt to call his PCP today Dr [name] to discuss. I let the pt know I have faxed the labs to PCP. He verbalized understanding and appreciation. He did not have any questions during the call.   Labs faxed to PCP.

## 2020-08-13 NOTE — Telephone Encounter (Signed)
-----   Message from Melvenia Beam, MD sent at 08/13/2020 10:44 AM EDT ----- Patient needs to follow up with pcp, his kidney function has become significantly impaired as compared to 9 months ago, his glucose is elevated as well. I am very concerned about his kidney function. Please fax results to pcp and ask patient to call them today thanks.

## 2020-08-14 NOTE — Telephone Encounter (Signed)
I spoke with Jeremiah Zhang at [facility] imaging and she switched his order due to the labs are high and I made her aware that it is for the trigeminal protocol.

## 2020-08-14 NOTE — Addendum Note (Signed)
Addended by: Sarina Ill B on: 08/14/2020 09:13 AM   Modules accepted: Orders

## 2020-08-14 NOTE — Telephone Encounter (Signed)
If he has a high Bun/creatinine levels it probably is best to switch the order to wo contrast.

## 2020-08-14 NOTE — Telephone Encounter (Signed)
Thanks, I have changed it to without. Please makes sure they know it is for trigeminal protocal thanks

## 2020-08-26 ENCOUNTER — Other Ambulatory Visit: Payer: Medicare Other

## 2020-08-26 ENCOUNTER — Ambulatory Visit
Admission: RE | Admit: 2020-08-26 | Discharge: 2020-08-26 | Disposition: A | Discharge status: Home / Self Care | Payer: BC Managed Care – PPO | Source: Ambulatory Visit | Attending: Neurology | Admitting: Neurology

## 2020-08-26 DIAGNOSIS — R200 Anesthesia of skin: Secondary | ICD-10-CM

## 2020-08-26 DIAGNOSIS — R2689 Other abnormalities of gait and mobility: Secondary | ICD-10-CM

## 2020-08-26 DIAGNOSIS — G500 Trigeminal neuralgia: Secondary | ICD-10-CM

## 2020-08-26 DIAGNOSIS — R270 Ataxia, unspecified: Secondary | ICD-10-CM

## 2020-08-26 DIAGNOSIS — W19XXXA Unspecified fall, initial encounter: Secondary | ICD-10-CM

## 2020-08-26 DIAGNOSIS — R202 Paresthesia of skin: Secondary | ICD-10-CM

## 2020-09-02 ENCOUNTER — Ambulatory Visit (INDEPENDENT_AMBULATORY_CARE_PROVIDER_SITE_OTHER): Payer: Medicare Other | Admitting: Podiatry

## 2020-09-02 ENCOUNTER — Encounter: Payer: Self-pay | Admitting: Podiatry

## 2020-09-02 ENCOUNTER — Other Ambulatory Visit: Payer: Self-pay

## 2020-09-02 DIAGNOSIS — E1149 Type 2 diabetes mellitus with other diabetic neurological complication: Secondary | ICD-10-CM

## 2020-09-02 DIAGNOSIS — M79675 Pain in left toe(s): Secondary | ICD-10-CM | POA: Diagnosis not present

## 2020-09-02 DIAGNOSIS — M79674 Pain in right toe(s): Secondary | ICD-10-CM | POA: Diagnosis not present

## 2020-09-02 DIAGNOSIS — B351 Tinea unguium: Secondary | ICD-10-CM | POA: Diagnosis not present

## 2020-09-02 NOTE — Progress Notes (Signed)
Subjective: 74 y.o. returns the office today for painful, elongated, thickened toenails which he cannot trim himself.  No redness or drainage or signs of infection noted.  No recent injury or falls. Denies any systemic complaints such as fevers, chills, nausea, vomiting.   PCP: [name], MD  Objective: AAO 3, NAD DP/PT pulses palpable, CRT less than 3 seconds Nails hypertrophic, dystrophic, elongated, brittle, discolored 10. There is tenderness overlying the nails 1-5 bilaterally. There is no surrounding erythema or drainage along the nail sites. No open lesions or pre-ulcerative lesions are identified  Bunion present. No other areas of tenderness bilateral lower extremities. No overlying edema, erythema, increased warmth. No pain with calf compression, swelling, warmth, erythema.  Assessment: Patient presents with symptomatic onychomycosis  Plan: -Treatment options including alternatives, risks, complications were discussed -Nails sharply debrided 10 without complication/bleeding.   -Discussed daily foot inspection. If there are any changes, to call the office immediately.  -Follow-up in 3 months or sooner if any problems are to arise. In the meantime, encouraged to call the office with any questions, concerns, changes symptoms.  Celesta Gentile, DPM

## 2020-10-02 ENCOUNTER — Ambulatory Visit (INDEPENDENT_AMBULATORY_CARE_PROVIDER_SITE_OTHER): Payer: Medicare Other | Admitting: Dermatology

## 2020-10-02 ENCOUNTER — Other Ambulatory Visit: Payer: Self-pay

## 2020-10-02 DIAGNOSIS — L578 Other skin changes due to chronic exposure to nonionizing radiation: Secondary | ICD-10-CM

## 2020-10-02 DIAGNOSIS — L820 Inflamed seborrheic keratosis: Secondary | ICD-10-CM | POA: Diagnosis not present

## 2020-10-02 DIAGNOSIS — L821 Other seborrheic keratosis: Secondary | ICD-10-CM | POA: Diagnosis not present

## 2020-10-02 DIAGNOSIS — L570 Actinic keratosis: Secondary | ICD-10-CM

## 2020-10-02 DIAGNOSIS — Z85828 Personal history of other malignant neoplasm of skin: Secondary | ICD-10-CM

## 2020-10-02 NOTE — Progress Notes (Signed)
Follow-Up Visit   Subjective  Jeremiah Zhang is a 74 y.o. male who presents for the following: Skin Problem (Pt c/o irritated spots growing and itching on the L ear, forehead, scalp, hx of skin cancer in the L ear). He has several spots and growths that are irritating he would like evaluation and possible treatment.  The following portions of the chart were reviewed this encounter and updated as appropriate:   Tobacco  Allergies  Meds  Problems  Med Hx  Surg Hx  Fam Hx     Review of Systems:  No other skin or systemic complaints except as noted in HPI or Assessment and Plan.  Objective  Well appearing patient in no apparent distress; mood and affect are within normal limits.  A focused examination was performed including face,scalp,ears . Relevant physical exam findings are noted in the Assessment and Plan.  Objective  L ear concha, scalp, forehead (21): Erythematous thin papules/macules with gritty scale.   Objective  L superior forehead (4): Erythematous keratotic or waxy stuck-on papule or plaque.   Objective  multiple see history: Well healed scar with no evidence of recurrence.    Assessment & Plan  AK (actinic keratosis) (21) L ear concha, scalp, forehead  Destruction of lesion - L ear concha, scalp, forehead Complexity: simple   Destruction method: cryotherapy   Informed consent: discussed and consent obtained   Timeout:  patient name, date of birth, surgical site, and procedure verified Lesion destroyed using liquid nitrogen: Yes   Region frozen until ice ball extended beyond lesion: Yes   Outcome: patient tolerated procedure well with no complications   Post-procedure details: wound care instructions given    Inflamed seborrheic keratosis (4) L superior forehead  Destruction of lesion - L superior forehead Complexity: simple   Destruction method: cryotherapy   Informed consent: discussed and consent obtained   Timeout:  patient name, date of birth,  surgical site, and procedure verified Lesion destroyed using liquid nitrogen: Yes   Region frozen until ice ball extended beyond lesion: Yes   Outcome: patient tolerated procedure well with no complications   Post-procedure details: wound care instructions given    History of basal cell carcinoma (BCC) multiple see history  Clear. Observe for recurrence. Call clinic for new or changing lesions.  Recommend regular skin exams, daily broad-spectrum spf 30+ sunscreen use, and photoprotection.     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  The purpose is to decrease the number of clinically evident and subclinical PreCancerous lesions to prevent progression to development of skin cancer by chemically destroying early precancer changes that may or may not be visible.  It has been shown to reduce the risk of developing skin cancer in the treated area. As a result of treatment, redness, scaling, crusting, and open sores may occur during treatment course. One or more than one of these methods may be used and may have to be used several times to control, suppress and eliminate the PreCancerous changes. Discussed treatment course, expected reaction, and possible side effects.  Actinic Damage - chronic, secondary to cumulative UV radiation exposure/sun exposure over time - diffuse  scaly erythematous macules with underlying dyspigmentation - Recommend daily broad spectrum sunscreen SPF 30+ to sun-exposed areas, reapply every 2  hours as needed.  - Call for new or changing lesions.  Seborrheic Keratoses - Stuck-on, waxy, tan-brown papules and plaques  - Discussed benign etiology and prognosis. - Observe - Call for any changes  Return in about 2 months (around 12/03/2020) for Aks .  [name], CMA, am acting as scribe for [name], MD .  Documentation: I have reviewed the above documentation for accuracy and completeness, and I agree with the above.  [name], MD

## 2020-10-02 NOTE — Patient Instructions (Signed)

## 2020-10-08 ENCOUNTER — Encounter: Payer: Self-pay | Admitting: Dermatology

## 2020-10-24 ENCOUNTER — Other Ambulatory Visit: Payer: Self-pay

## 2020-10-24 DIAGNOSIS — E291 Testicular hypofunction: Secondary | ICD-10-CM

## 2020-10-27 ENCOUNTER — Other Ambulatory Visit: Payer: Self-pay

## 2020-10-27 ENCOUNTER — Other Ambulatory Visit: Payer: Medicare PPO

## 2020-10-27 DIAGNOSIS — E291 Testicular hypofunction: Secondary | ICD-10-CM

## 2020-10-28 LAB — HEMOGLOBIN AND HEMATOCRIT, BLOOD
Hematocrit: 40.9 % (ref 37.5–51.0)
Hemoglobin: 13.9 g/dL (ref 13.0–17.7)

## 2020-10-28 LAB — TESTOSTERONE: Testosterone: 381 ng/dL (ref 264–916)

## 2020-10-28 LAB — PSA: Prostate Specific Ag, Serum: <0.1 ng/mL (ref 0.0–4.0)

## 2020-10-30 ENCOUNTER — Ambulatory Visit (INDEPENDENT_AMBULATORY_CARE_PROVIDER_SITE_OTHER): Payer: Medicare PPO | Admitting: Urology

## 2020-10-30 ENCOUNTER — Other Ambulatory Visit: Payer: Self-pay

## 2020-10-30 ENCOUNTER — Encounter: Payer: Self-pay | Admitting: Urology

## 2020-10-30 VITALS — BP 155/95 | HR 72 | Ht 70.0 in | Wt 234.0 lb

## 2020-10-30 DIAGNOSIS — E291 Testicular hypofunction: Secondary | ICD-10-CM

## 2020-10-30 DIAGNOSIS — N5237 Erectile dysfunction following prostate ablative therapy: Secondary | ICD-10-CM | POA: Diagnosis not present

## 2020-10-30 DIAGNOSIS — Z8546 Personal history of malignant neoplasm of prostate: Secondary | ICD-10-CM | POA: Diagnosis not present

## 2020-10-30 NOTE — Progress Notes (Signed)
10/30/2020 11:03 AM   Jeremiah Zhang [birth year] [id number]  Referring provider: [name], MD [phone] [facility] [place],  [place] [address]  Chief Complaint  Patient presents with  . Prostate Cancer  . 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 male presents for annual follow-up.   Overall has done well since last years visit  Remains on AndroGel  Recently with right back pain and on evaluation felt to be musculoskeletal-starting PT  No dysuria, gross hematuria  Denies flank, abdominal or pelvic pain  Labs 10/27/2020: Testosterone 381; PSA <0.1; hematocrit 40.9   PMH: Past Medical History:  Diagnosis Date  . Actinic keratosis   . Anticoagulant long-term use    ELIQUIS  . Arthritis   . Basal cell carcinoma 01/27/2010   Right spinal upper back. Superficial.   . Basal cell carcinoma 11/15/2019   Left inferior antihelix post. to tragus. Superficial and nodular patterns. Excised: 11/27/2019  . Basal cell carcinoma 11/15/2019   Left mid lateral nose. Nodular and infiltrative patterns. Excised: 11/27/2019  . Basal cell carcinoma 11/27/2019   Left mid superior helix. BCC with focal sclerosis.  . Basal cell carcinoma 01/29/2020   Left mid superior ear helix  . 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 Slight atypia. Edges free.  . ED (erectile dysfunction)   . GERD (gastroesophageal reflux disease)   . Hiatal hernia   . History of DVT of lower extremity    POST OP KNEE SURGERY  . History of transient ischemic attack (TIA)    PER PT HX  ?POSSIBLE-- PT ON ELIQUIS   . Hyperlipidemia   . Hypertension   . Hypogonadism male   . OSA (obstructive sleep apnea)    per pt positive sleep study yrs ago recommended CPAP --  pt Non- compliant -- states uses Breath Rite Nasal Strips  . Peripheral neuropathy   . Prostate cancer [facility]) urologist-  dr tannenbaum/  oncologist-- dr [name]   dx 03-25-2015  T1c, Gleason 4+3  s/p  cryoablation prostate 06-02-2015/  external  radiation therapy 08-16-2016 to 09-20-2016/  scheduled for radioactive seed implants 10-22-2016  . TIA (transient ischemic attack)    ?? 08/20/2014? Eliquis 07/02/15  . Type 2 diabetes mellitus ([place])    followed by dr [name] [facility] clinic)  last A1c 8.4 on 07-27-2016 per lov note  . Umbilical hernia   . Wears glasses   . Wears hearing aid    bilateral    Surgical History: Past Surgical History:  Procedure Laterality Date  . COLONOSCOPY WITH PROPOFOL N/A 05/30/2017   Procedure: COLONOSCOPY WITH PROPOFOL;  Surgeon: [name], MD;  Location[facility] ENDOSCOPY;  Service: Endoscopy;  Laterality: N/A;  . CRYOABLATION N/A 06/02/2015   Procedure: CRYO ABLATION PROSTATE;  Surgeon: Carolan Clines, MD;  Location: [facility];  Service: Urology;  Laterality: N/A;  . CYSTOSCOPY WITH URETHRAL DILATATION N/A 05/04/2019   Procedure: CYSTOSCOPY WITH URETHRAL DILATATION;  Surgeon: [name], MD;  Location: [facility];  Service: Urology;  Laterality: N/A;  . ELBOW BURSA SURGERY Left Feb 1997  . prostate biopsies     x12, cryotherapy  . RADIOACTIVE SEED IMPLANT  N/A 10/22/2016   Procedure: RADIOACTIVE SEED IMPLANT/BRACHYTHERAPY IMPLANT, URETHRAL STRICTURE DIALIATION;  Surgeon: Carolan Clines, MD;  Location: [facility];  Service: Urology;  Laterality: N/A;  . SHOULDER OPEN ROTATOR CUFF REPAIR Left 09-04-2014  . skin cancer L ear     Dr [name]   . TOTAL KNEE ARTHROPLASTY Bilateral right 08-13-2008  &  left 11-14-2008  . UMBILICAL HERNIA  REPAIR  06-23-2012  . ureter stretch  05/02/2019   [facility] Dr [name]    Home Medications:  Allergies as of 10/30/2020      Reactions   Bee Venom Anaphylaxis   Crestor [rosuvastatin] Other (See Comments)   "muscle weakness and discomfort"   Vytorin [ezetimibe-simvastatin] Other (See Comments)   "muscle weakness and discomfort"   Dynapen [dicloxacillin] Rash      Medication List       Accurate as of October 30, 2020 11:03 AM. If you have any questions, ask your nurse or doctor.        Accu-Chek FastClix Lancets Misc   ADVIL PO Take 2 tablets by mouth as needed.   ALEVE PO Take 2-3 tablets by mouth as needed.   amLODipine 5 MG tablet Commonly known as: NORVASC TAKE 1 TABLET(5 MG) BY MOUTH EVERY DAY   apixaban 5 MG Tabs tablet Commonly known as: ELIQUIS TAKE 1 TABLET(5 MG) BY MOUTH TWICE DAILY   ascorbic acid 500 MG tablet Commonly known as: VITAMIN C Take 1,000 mg by mouth daily.   bisoprolol-hydrochlorothiazide 10-6.25 MG tablet Commonly known as: ZIAC TAKE 1 TABLET BY MOUTH EVERY DAY   EPINEPHrine 0.3 mg/0.3 mL Soaj injection Commonly known as: EPI-PEN Inject into the muscle as needed.   EPINEPHrine 0.3 mg/0.3 mL Soaj injection Commonly known as: EPI-PEN INJECT 0.3 MG INTO THE MUSCLE ONCE AS NEEDED FOR ANAPHYLAXIS   esomeprazole 40 MG capsule Commonly known as: NEXIUM Take 40 mg by mouth every evening.   EXCEDRIN PO Take 2-3 tablets by mouth. Rarely as needed for headache. Once in 3 mo max. +2 more in 1/2 if a severe headache.   fluticasone 50 MCG/ACT nasal spray Commonly known as: FLONASE Place into both nostrils.   furosemide 40 MG tablet Commonly known as: LASIX Take 40 mg by mouth daily.   glimepiride 2 MG tablet Commonly known as: AMARYL TAKE 1 TABLET BY MOUTH TWICE DAILY   glucose blood test strip TEST TWICE DAILY AS INSTRUCTED   metFORMIN 1000 MG tablet Commonly known as: GLUCOPHAGE TAKE 1 TABLET(1000 MG) BY MOUTH TWICE DAILY  WITH MEALS   quinapril 20 MG tablet Commonly known as: ACCUPRIL TAKE 1 TABLET BY MOUTH TWICE DAILY   RED YEAST RICE PO Take by mouth 2 (two) times daily.   sitaGLIPtin 100 MG tablet Commonly known as: JANUVIA Take by mouth.   Testosterone 20.25 MG/ACT (1.62%) Gel Apply topically.   TYLENOL PO Take 2-3 tablets by mouth as needed.   Vitamin D3 50 MCG (2000 UT) Tabs Take 1 capsule by mouth daily.       Allergies:  Allergies  Allergen Reactions  . Bee Venom Anaphylaxis  . Crestor [Rosuvastatin] Other (See Comments)    "muscle weakness and discomfort"  . Vytorin [Ezetimibe-Simvastatin] Other (See Comments)    "muscle weakness and discomfort"  . Dynapen [Dicloxacillin] Rash    Family History: Family History  Problem Relation Age of Onset  . Cancer Mother 47       colon/resection    Social History:  reports that he has  never smoked. He has never used smokeless tobacco. He reports that he does not drink alcohol and does not use drugs.   Physical Exam: BP (!) 155/95   Pulse 72   Ht 5\' 10"  (1.778 m)   Wt 234 lb (106.1 kg)   BMI 33.58 kg/m   Constitutional:  Alert and oriented, No acute distress. HEENT: [place] AT, moist mucus membranes.  Trachea midline, no masses. Cardiovascular: No clubbing, cyanosis, or edema. Respiratory: Normal respiratory effort, no increased work of breathing. Skin: No rashes, bruises or suspicious lesions. Neurologic: Grossly intact, no focal deficits, moving all 4 extremities. Psychiatric: Normal mood and affect.    Assessment & Plan:    1.  History of prostate cancer  PSA remains undetectable  2. Hypogonadism  AndroGel refilled  3. Erectile dysfunction  Failed PDE 5 inhibitors and wave tech  Not interested in intracavernosal injections  Continue annual follow-up   Abbie Sons, MD  [place] [address] [place], [place] [address] [phone]

## 2020-10-31 ENCOUNTER — Encounter: Payer: Self-pay | Admitting: Urology

## 2020-10-31 MED ORDER — TESTOSTERONE 20.25 MG/ACT (1.62%) TD GEL
TRANSDERMAL | 3 refills | Status: DC
Start: 2020-10-31 — End: 2021-06-29

## 2020-11-03 ENCOUNTER — Encounter: Payer: Self-pay | Admitting: Emergency Medicine

## 2020-11-03 ENCOUNTER — Ambulatory Visit
Admission: EM | Admit: 2020-11-03 | Discharge: 2020-11-03 | Disposition: A | Discharge status: Home / Self Care | Payer: Medicare PPO | Attending: Emergency Medicine | Admitting: Emergency Medicine

## 2020-11-03 ENCOUNTER — Telehealth: Payer: Self-pay

## 2020-11-03 ENCOUNTER — Other Ambulatory Visit: Payer: Self-pay

## 2020-11-03 DIAGNOSIS — L03012 Cellulitis of left finger: Secondary | ICD-10-CM

## 2020-11-03 MED ORDER — DOXYCYCLINE HYCLATE 100 MG PO CAPS: 100.0000 mg | ORAL_CAPSULE | Freq: Two times a day (BID) | ORAL | 0 refills | Status: DC

## 2020-11-03 MED ORDER — DOXYCYCLINE HYCLATE 100 MG PO CAPS: 100.0000 mg | ORAL_CAPSULE | Freq: Two times a day (BID) | ORAL | 0 refills | Status: AC

## 2020-11-03 NOTE — ED Triage Notes (Addendum)
Pt c/o of pain and swelling to left 4th finger x 4 days. Has difficulty with bending finger. Denies injury

## 2020-11-03 NOTE — Discharge Instructions (Addendum)
Keep area(s) clean and dry. Take antibiotic as prescribed with food - important to complete course. Return for worsening pain, redness, swelling, discharge, fever. 

## 2020-11-03 NOTE — ED Provider Notes (Signed)
[facility]    CSN: [id number] Arrival date & time: 11/03/20  1541      History   Chief Complaint Chief Complaint  Patient presents with  . Finger Injury    HPI Jeremiah Zhang is a 75 y.o. male  W/ hx as below presenting for L 4th finger pain, swelling x 4 days.  Concerned for infection.  No fevers.  Past Medical History:  Diagnosis Date  . Actinic keratosis   . Anticoagulant long-term use    ELIQUIS  . Arthritis   . Basal cell carcinoma 01/27/2010   Right spinal upper back. Superficial.   . Basal cell carcinoma 11/15/2019   Left inferior antihelix post. to tragus. Superficial and nodular patterns. Excised: 11/27/2019  . Basal cell carcinoma 11/15/2019   Left mid lateral nose. Nodular and infiltrative patterns. Excised: 11/27/2019  . Basal cell carcinoma 11/27/2019   Left mid superior helix. BCC with focal sclerosis.  . Basal cell carcinoma 01/29/2020   Left mid superior ear helix  . 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 Slight atypia. Edges free.  . ED (erectile dysfunction)   . GERD (gastroesophageal reflux disease)   . Hiatal hernia   . History of DVT of lower extremity    POST OP KNEE SURGERY  . History of transient ischemic attack (TIA)    PER PT HX  ?POSSIBLE-- PT ON ELIQUIS  . Hyperlipidemia   . Hypertension   . Hypogonadism male   . OSA (obstructive sleep apnea)    per pt positive sleep study yrs ago recommended CPAP --  pt Non- compliant -- states uses Breath Rite Nasal Strips  . Peripheral neuropathy   . Prostate cancer [facility]) urologist-  dr tannenbaum/  oncologist-- dr [name]   dx 03-25-2015  T1c, Gleason 4+3  s/p  cryoablation prostate 06-02-2015/  external  radiation therapy 08-16-2016 to 09-20-2016/  scheduled for radioactive seed implants 10-22-2016  . TIA (transient ischemic attack)    ?? 08/20/2014? Eliquis 07/02/15  . Type 2 diabetes mellitus ([place])    followed by dr [name] [facility] clinic)  last A1c 8.4 on 07-27-2016  per lov note  . Umbilical hernia   . Wears glasses   . Wears hearing aid    bilateral    Patient Active Problem List   Diagnosis Date Noted  . Personal history of other malignant neoplasm of skin 01/02/2020  . GERD (gastroesophageal reflux disease) 08/28/2019  . Hyperglycemia 08/28/2019  . Hyperlipidemia 08/28/2019  . Hypertension 08/28/2019  . Obesity 08/28/2019  . Myalgia due to statin 12/28/2018  . Hemorrhoids, internal, with bleeding 11/16/2018  . Erectile dysfunction following radiation therapy 08/10/2018  . Hypogonadism in male 05/11/2018  . Diabetes mellitus without complication ([place]) [id number]  . Prostate cancer ([place]) 07/14/2016  . Status post rotator cuff repair 01/05/2015  . Encounter for long-term (current) use of other medications 01/15/2014  . Benign prostatic hyperplasia without lower urinary tract symptoms 06/27/2012    Past Surgical History:  Procedure Laterality Date  . COLONOSCOPY WITH PROPOFOL N/A 05/30/2017   Procedure: COLONOSCOPY WITH PROPOFOL;  Surgeon: [name], MD;  Location: [facility] ENDOSCOPY;  Service: Endoscopy;  Laterality: N/A;  . CRYOABLATION N/A 06/02/2015   Procedure: CRYO ABLATION PROSTATE;  Surgeon: Carolan Clines, MD;  Location: [facility];  Service: Urology;  Laterality: N/A;  . CYSTOSCOPY WITH URETHRAL DILATATION N/A 05/04/2019   Procedure: CYSTOSCOPY WITH URETHRAL DILATATION;  Surgeon: [name], MD;  Location: [facility];  Service: Urology;  Laterality: N/A;  . ELBOW BURSA SURGERY Left Feb 1997  . prostate biopsies     x12, cryotherapy  . RADIOACTIVE SEED IMPLANT N/A 10/22/2016   Procedure: RADIOACTIVE SEED IMPLANT/BRACHYTHERAPY IMPLANT, URETHRAL STRICTURE DIALIATION;  Surgeon: Carolan Clines, MD;  Location: [place];  Service: Urology;  Laterality: N/A;  . SHOULDER OPEN ROTATOR CUFF REPAIR Left 09-04-2014  . skin cancer L ear     Dr [name]   . TOTAL KNEE ARTHROPLASTY  Bilateral right 08-13-2008  &  left 11-14-2008  . UMBILICAL HERNIA REPAIR  06-23-2012  . ureter stretch  05/02/2019   [facility] Dr [name]       Home Medications    Prior to Admission medications   Medication Sig Start Date End Date Taking? Authorizing Provider  doxycycline (VIBRAMYCIN) 100 MG capsule Take 1 capsule (100 mg total) by mouth 2 (two) times daily for 3 days. 11/03/20 11/06/20 Yes Hall-Potvin, [place], PA-C  ACCU-CHEK FASTCLIX LANCETS MISC  10/29/16   [provider]  Acetaminophen (TYLENOL PO) Take 2-3 tablets by mouth as needed.    [provider]  amLODipine (NORVASC) 5 MG tablet TAKE 1 TABLET(5 MG) BY MOUTH EVERY DAY 12/17/19   [provider]  apixaban (ELIQUIS) 5 MG TABS tablet TAKE 1 TABLET(5 MG) BY MOUTH TWICE DAILY 10/01/19   [provider]  ascorbic acid (VITAMIN C) 500 MG tablet Take 1,000 mg by mouth daily.    [provider]  Aspirin-Acetaminophen-Caffeine (EXCEDRIN PO) Take 2-3 tablets by mouth. Rarely as needed for headache. Once in 3 mo max. +2 more in 1/2 if a severe headache.    [provider]  bisoprolol-hydrochlorothiazide (ZIAC) 10-6.25 MG tablet TAKE 1 TABLET BY MOUTH EVERY DAY 10/01/19   [provider]  Cholecalciferol (VITAMIN D3) 2000 UNITS TABS Take 1 capsule by mouth daily.    [provider]  EPINEPHrine 0.3 mg/0.3 mL IJ SOAJ injection Inject into the muscle as needed.    [provider]  EPINEPHrine 0.3 mg/0.3 mL IJ SOAJ injection INJECT 0.3 MG INTO THE MUSCLE ONCE AS NEEDED FOR ANAPHYLAXIS 10/10/19   [provider]  esomeprazole (NEXIUM) 40 MG capsule Take 40 mg by mouth every evening.     [provider]  fluticasone (FLONASE) 50 MCG/ACT nasal spray Place into both nostrils. 10/01/20   [provider]  furosemide (LASIX) 40 MG tablet Take 40 mg by mouth daily.  11/15/19   [provider]  glimepiride (AMARYL) 2 MG tablet TAKE 1  TABLET BY MOUTH TWICE DAILY 07/03/12   [provider]  glucose blood test strip TEST TWICE DAILY AS INSTRUCTED 10/29/16   [provider]  Ibuprofen (ADVIL PO) Take 2 tablets by mouth as needed.    [provider]  metFORMIN (GLUCOPHAGE) 1000 MG tablet TAKE 1 TABLET(1000 MG) BY MOUTH TWICE DAILY WITH MEALS 10/01/19   [provider]  quinapril (ACCUPRIL) 20 MG tablet TAKE 1 TABLET BY MOUTH TWICE DAILY 07/03/12   [provider]  Red Yeast Rice Extract (RED YEAST RICE PO) Take by mouth 2 (two) times daily.    [provider]  sitaGLIPtin (JANUVIA) 100 MG tablet Take by mouth. 01/01/20 12/31/20  [provider]  Testosterone 20.25 MG/ACT (1.62%) GEL Apply 1 pump to each shoulder daily 10/31/20   Abbie Sons, MD    Family History Family History  Problem Relation Age of Onset  . Cancer Mother 60  colon/resection    Social History Social History   Tobacco Use  . Smoking status: Never Smoker  . Smokeless tobacco: Never Used  Substance Use Topics  . Alcohol use: Never  . Drug use: Never     Allergies   Bee venom, Crestor [rosuvastatin], Vytorin [ezetimibe-simvastatin], and Dynapen [dicloxacillin]   Review of Systems Review of Systems  Constitutional: Negative for fatigue and fever.  Respiratory: Negative for cough and shortness of breath.   Cardiovascular: Negative for chest pain and palpitations.  Gastrointestinal: Negative for abdominal pain, diarrhea and vomiting.  Skin: Positive for wound. Negative for rash.  Neurological: Negative for speech difficulty and headaches.  All other systems reviewed and are negative.    Physical Exam Triage Vital Signs ED Triage Vitals  Enc Vitals Group     BP      Pulse      Resp      Temp      Temp src      SpO2      Weight      Height      Head Circumference      Peak Flow      Pain Score      Pain Loc      Pain Edu?      Excl. in [place]?    No data  found.  Updated Vital Signs BP 134/83 (BP Location: Left Arm)   Pulse 98   Temp 98.8 F (37.1 C)   Resp 20   Ht 5\' 10"  (1.778 m)   Wt 225 lb (102.1 kg)   SpO2 93%   BMI 32.28 kg/m   Visual Acuity Right Eye Distance:   Left Eye Distance:   Bilateral Distance:    Right Eye Near:   Left Eye Near:    Bilateral Near:     Physical Exam Constitutional:      General: He is not in acute distress. HENT:     Head: Normocephalic and atraumatic.  Eyes:     General: No scleral icterus.    Pupils: Pupils are equal, round, and reactive to light.  Cardiovascular:     Rate and Rhythm: Normal rate.  Pulmonary:     Effort: Pulmonary effort is normal. No respiratory distress.     Breath sounds: No wheezing.  Musculoskeletal:        General: Swelling and tenderness present.     Comments: L 4th finger w/ paronychia  Skin:    Coloration: Skin is not jaundiced or pale.     Findings: Erythema present.  Neurological:     Mental Status: He is alert and oriented to person, place, and time.      UC Treatments / Results  Labs (all labs ordered are listed, but only abnormal results are displayed) Labs Reviewed - No data to display  EKG   Radiology No results found.  Procedures Incision and Drainage  Date/Time: 11/03/2020 5:02 PM Performed by: Quincy Sheehan, PA-C Authorized by: Quincy Sheehan, PA-C   Consent:    Consent obtained:  Verbal   Consent given by:  Patient   Risks discussed:  Bleeding, incomplete drainage, pain and damage to other organs   Alternatives discussed:  No treatment Universal protocol:    Patient identity confirmed:  Verbally with patient Location:    Type:  Abscess   Size:  1 cm   Location:  Upper extremity   Upper extremity location:  Finger   Finger location:  L ring finger Pre-procedure details:  Skin preparation:  Betadine Sedation:    Sedation type:  None Anesthesia:    Anesthesia method:  Local infiltration   Local  anesthetic:  Lidocaine 2% w/o epi Procedure type:    Complexity:  Simple Procedure details:    Needle aspiration: no     Incision types:  Single straight   Incision depth:  Subcutaneous   Scalpel blade:  11   Wound management:  Probed and deloculated, irrigated with saline and extensive cleaning   Drainage:  Purulent and bloody   Drainage amount:  Copious   Wound treatment:  Wound left open   Packing materials:  None Post-procedure details:    Procedure completion:  Tolerated well, no immediate complications Comments:     NVI pre/post proc   (including critical care time)  Medications Ordered in UC Medications - No data to display  Initial Impression / Assessment and Plan / UC Course  I have reviewed the triage vital signs and the nursing notes.  Pertinent labs & imaging results that were available during my care of the patient were reviewed by me and considered in my medical decision making (see chart for details).     I&D performed in office: Patient tolerated well.  Given history malignancy, will follow trochars antibiotics.  Return precautions discussed, pt verbalized understanding and is agreeable to plan. Final Clinical Impressions(s) / UC Diagnoses   Final diagnoses:  Paronychia of left ring finger     Discharge Instructions     Keep area(s) clean and dry. Take antibiotic as prescribed with food - important to complete course. Return for worsening pain, redness, swelling, discharge, fever.    ED Prescriptions    Medication Sig Dispense Auth. Provider   doxycycline (VIBRAMYCIN) 100 MG capsule Take 1 capsule (100 mg total) by mouth 2 (two) times daily for 3 days. 6 capsule Hall-Potvin, [place], PA-C     PDMP not reviewed this encounter.   Hall-Potvin, [place], [place] 11/03/20 1703

## 2020-11-05 ENCOUNTER — Telehealth: Payer: Self-pay | Admitting: *Deleted

## 2020-11-05 ENCOUNTER — Ambulatory Visit: Payer: Medicare PPO | Attending: Family Medicine

## 2020-11-05 ENCOUNTER — Other Ambulatory Visit: Payer: Self-pay

## 2020-11-05 DIAGNOSIS — M25651 Stiffness of right hip, not elsewhere classified: Secondary | ICD-10-CM

## 2020-11-05 DIAGNOSIS — G8929 Other chronic pain: Secondary | ICD-10-CM | POA: Insufficient documentation

## 2020-11-05 DIAGNOSIS — M545 Low back pain, unspecified: Secondary | ICD-10-CM

## 2020-11-05 NOTE — Therapy (Signed)
[facility] [address] [place], [address] Phone: [phone]   Fax:  [phone]  Physical Therapy Evaluation  Patient Details  Name: Jeremiah Zhang MRN: [id number] Date of Birth: 04/17/1946 Referring Provider (PT): [name]   Encounter Date: 11/05/2020   PT End of Session - 11/05/20 1700    Visit Number 1    Number of Visits 17    Date for PT Re-Evaluation 12/31/20    Authorization Type [employer]    Authorization Time Period 11/05/20-12/31/20    PT Start Time 1602    PT Stop Time 1652    PT Time Calculation (min) 50 min    Activity Tolerance Patient tolerated treatment well    Behavior During Therapy [facility] for tasks assessed/performed           Past Medical History:  Diagnosis Date  . Actinic keratosis   . Anticoagulant long-term use    ELIQUIS  . Arthritis   . Basal cell carcinoma 01/27/2010   Right spinal upper back. Superficial.   . Basal cell carcinoma 11/15/2019   Left inferior antihelix post. to tragus. Superficial and nodular patterns. Excised: 11/27/2019  . Basal cell carcinoma 11/15/2019   Left mid lateral nose. Nodular and infiltrative patterns. Excised: 11/27/2019  . Basal cell carcinoma 11/27/2019   Left mid superior helix. BCC with focal sclerosis.  . Basal cell carcinoma 01/29/2020   Left mid superior ear helix  . 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 Slight atypia. Edges free.  . ED (erectile dysfunction)   . GERD (gastroesophageal reflux disease)   . Hiatal hernia   . History of DVT of lower extremity    POST OP KNEE SURGERY  . History of transient ischemic attack (TIA)    PER PT HX  ?POSSIBLE-- PT ON ELIQUIS  . Hyperlipidemia   . Hypertension   . Hypogonadism male   . OSA (obstructive sleep apnea)    per pt positive sleep study yrs ago recommended CPAP --  pt Non- compliant -- states uses Breath Rite Nasal Strips  . Peripheral neuropathy   . Prostate  cancer [facility]) urologist-  dr tannenbaum/  oncologist-- dr [name]   dx 03-25-2015  T1c, Gleason 4+3  s/p  cryoablation prostate 06-02-2015/  external  radiation therapy 08-16-2016 to 09-20-2016/  scheduled for radioactive seed implants 10-22-2016  . TIA (transient ischemic attack)    ?? 08/20/2014? Eliquis 07/02/15  . Type 2 diabetes mellitus ([place])    followed by dr [name] [facility] clinic)  last A1c 8.4 on 07-27-2016 per lov note  . Umbilical hernia   . Wears glasses   . Wears hearing aid    bilateral    Past Surgical History:  Procedure Laterality Date  . COLONOSCOPY WITH PROPOFOL N/A 05/30/2017   Procedure: COLONOSCOPY WITH PROPOFOL;  Surgeon: [name], MD;  Location: [facility] ENDOSCOPY;  Service: Endoscopy;  Laterality: N/A;  . CRYOABLATION N/A 06/02/2015   Procedure: CRYO ABLATION PROSTATE;  Surgeon: Carolan Clines, MD;  Location: [facility];  Service: Urology;  Laterality: N/A;  . CYSTOSCOPY WITH URETHRAL DILATATION N/A 05/04/2019   Procedure: CYSTOSCOPY WITH URETHRAL DILATATION;  Surgeon: [name], MD;  Location: [facility];  Service: Urology;  Laterality: N/A;  . ELBOW BURSA SURGERY Left Feb 1997  . prostate biopsies     x12, cryotherapy  . RADIOACTIVE SEED IMPLANT N/A 10/22/2016   Procedure: RADIOACTIVE SEED IMPLANT/BRACHYTHERAPY IMPLANT, URETHRAL STRICTURE  DIALIATION;  Surgeon: Carolan Clines, MD;  Location: [facility];  Service: Urology;  Laterality: N/A;  . SHOULDER OPEN ROTATOR CUFF REPAIR Left 09-04-2014  . skin cancer L ear     Dr [name]   . TOTAL KNEE ARTHROPLASTY Bilateral right 08-13-2008  &  left 11-14-2008  . UMBILICAL HERNIA REPAIR  06-23-2012  . ureter stretch  05/02/2019   [facility] Dr [name]    There were no vitals filed for this visit.    Subjective Assessment - 11/05/20 1656    Subjective Pt presenting to OPPT for help with chronic Rt low back pain without migration or  radiation. Minimal help with his chiropractor, but recently a bit better after 4 weeks of medications.    Pertinent History [name] is a 24yoM who reports several months of 'serious' back issues, no help after attempted chiropractic treatment. Pt denies any particular contributing event.  Pt has been as high at 8-9/10, but since starting new medication 1 month prior, reports pain has been improved to 5-6/10 instance, but does not have pain all the time. Pt denies any pain migration, denies any pain or paresthesias of the legs. Pt has had xrays done showing scoliosis, otherwise no concerning findings. Pt has been taking tylenol daily for a month now. Pt has aggravation of pain with lifting items 35lbs or more, also with getting in/out of bed. Remote hx bilat TKA. 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 His wife is a retired Therapist, sports who worked at [employer].    Currently in Pain? No/denies              [facility] PT Assessment - 11/05/20 0001      Assessment   Medical Diagnosis Rt sided low back pain without sciatica    Referring Provider (PT) [name]    Onset Date/Surgical Date --   July 2021   Prior Therapy --   None, has tried chiropractic     Precautions   Precautions None      Restrictions   Weight Bearing Restrictions No      Balance Screen   Has the patient fallen in the past 6 months No    Has the patient had a decrease in activity level because of a fear of falling?  No    Is the patient reluctant to leave their home because of a fear of falling?  No      Observation/Other Assessments   Focus on Therapeutic Outcomes (FOTO)  42/100          Overground gait assessment  Bilat trendelenburg in gait with fast hard end-feel. more pronounced on Right stance, some lateral sway in gait. Some abduction of the Left arm in gait. Minimal trunk movementl;   SLS:  Left: 19sec, Rt 2-3sec (feels less steady on Right, no pain)  Back ROM:  -full flexion,  extension ROM grossly, mild maintaned lordosis of L-spine throughout -Seated rotation: 80 degrees bilat, mild soreness after, but no soreness with A/ROM or overpressure -Lateral flexion, fingers to floor; 50cm Left; 48cm right (none painful during, only soreness after)  MMT: Hip flexion 5/5 Horizontal ABDCT 5/5; Horizontal ADD 5/5: Hip rotation both bilat 5/5 except for Rt external rotation (4/5) Hip ABDCT: 4/5 bilat but uncomfortable on right at posterior iliac fossa ;   Hip ROM: -Rt hip ER23 degress painful a bit lower; IR: 41 not painful  -Lt hip: 35 ER, 44 IR (not painful) FLexion ROM  WNL, but mild pain at end range   Diagnostic Tests: Grind Test: Right hip, negative   Objective measurements completed on examination: See above findings.      PT Education - 11/05/20 1659    Education Details How Right hip dysfunction is impairing motor control of trunk/low back    Person(s) Educated Patient    Methods Explanation;Demonstration    Comprehension Verbalized understanding;Returned demonstration;Need further instruction            PT Short Term Goals - 11/05/20 1711      PT SHORT TERM GOAL #1   Title After 4 weeks pt to demonstrate improved Rt SLS >10 sec.    Baseline <3sec    Time 4    Period Weeks    Status New    Target Date 12/03/20             PT Long Term Goals - 11/05/20 1712      PT LONG TERM GOAL #1   Title After 8 weeks pt to demonstrate seated Rt hip ER strength 5/5.    Baseline 4/5    Time 8    Period Weeks    Status New    Target Date 12/31/20      PT LONG TERM GOAL #2   Title After 8 weeks pt to demonstrate improved FOTO score to >70    Baseline 40/100 at eval    Time 8    Period Weeks    Status New    Target Date 12/31/20      PT LONG TERM GOAL #3   Title After 8 weeks pt to demonstrate 5xSTS <11 seconds to demonstrate improve function of hip/back extension.    Time 8    Period Weeks    Status New    Target Date 12/31/20                   Plan - 11/05/20 1701    Clinical Impression Statement Examination revealing of generally good symmetry on lumbar/thoracic ROM, none of which is provocative to symptoms during activity. Pt has general stiffness in lumbar spine with mild lordosis maintained during flexion/extension ROM. Pt has bilat disfunction of hips in gait, Right side worse and signs of more chornic compensation in cluding ABDCT of the LUE and difficulty maintaining a straight plane line of progress. Pt also has unsteadiness in Rt Single leg stance, restractied ROM of Right hip ER. No signs of gross hip capsular mobility loss, and a grind test negative, hence I suspect less intraarticular disesae and more component of spastic lateral gluteal restrictions causing decreased Right hip motor control, decresaed proprioception, increased pain, and contributions to compensated lumbar spine facet closure as a compensation. Pt will benefit from skilled PT intervention to address deficits and impairment identified in this evaluation in order to improve strength, tissue mobility, gross motor control, and restore tolerance to ADL, IADL, and leisure actiivity.    Personal Factors and Comorbidities Age;Behavior Pattern;Fitness;Past/Current Experience    Examination-Activity Limitations Bathing;Locomotion Level;Bend;Carry;Sleep;Dressing;Stairs;Stand    Examination-Participation Restrictions Community Activity;Interpersonal Relationship;Yard Work    Stability/Clinical Decision Making Stable/Uncomplicated    Designer, jewellery Low    Rehab Potential Good    PT Frequency 2x / week    PT Duration 8 weeks    PT Treatment/Interventions ADLs/Self Care Home Management;Gait training;Stair training;Joint Manipulations;Dry needling;Passive range of motion;Patient/family education;Therapeutic exercise;Balance training;Neuromuscular re-education;DME Instruction;Moist Heat;Electrical Stimulation    PT Next Visit Plan 5xSTS, HEP setup,  palpation of Rt  gluteals, trial of manual release    PT Home Exercise Plan deferred to vsit 2    Consulted and Agree with Plan of Care Patient           Patient will benefit from skilled therapeutic intervention in order to improve the following deficits and impairments:  Abnormal gait,Decreased range of motion,Difficulty walking,Increased muscle spasms,Decreased activity tolerance,Decreased knowledge of precautions,Decreased balance,Decreased mobility,Decreased strength,Improper body mechanics  Visit Diagnosis: Chronic right-sided low back pain without sciatica  Stiffness of right hip, not elsewhere classified     Problem List Patient Active Problem List   Diagnosis Date Noted  . Personal history of other malignant neoplasm of skin 01/02/2020  . GERD (gastroesophageal reflux disease) 08/28/2019  . Hyperglycemia 08/28/2019  . Hyperlipidemia 08/28/2019  . Hypertension 08/28/2019  . Obesity 08/28/2019  . Myalgia due to statin 12/28/2018  . Hemorrhoids, internal, with bleeding 11/16/2018  . Erectile dysfunction following radiation therapy 08/10/2018  . Hypogonadism in male 05/11/2018  . Diabetes mellitus without complication ([place]) 79/48/0165  . Prostate cancer ([place]) 07/14/2016  . Status post rotator cuff repair 01/05/2015  . Encounter for long-term (current) use of other medications 01/15/2014  . Benign prostatic hyperplasia without lower urinary tract symptoms 06/27/2012   5:27 PM, 11/05/20 Etta Grandchild, PT, DPT Physical Therapist - [place] [phone] (Office)   Tywanda Rice C 11/05/2020, 5:18 PM  [facility] [address] [place], [address] Phone: [phone]   Fax:  [phone]  Name: PILAR CORRALES MRN: [id number] Date of Birth: 1945/10/30

## 2020-11-05 NOTE — Telephone Encounter (Signed)
Spoke with patient and advised results   

## 2020-11-05 NOTE — Telephone Encounter (Signed)
Testosterone approved 1/18/20022- 10/17/2021

## 2020-11-06 NOTE — Telephone Encounter (Signed)
Not sure why [facility] is saying this is not approved? Pt has called insurance and stated we need to call again.  Approvedon January 19 PA Case: [id number], Status: Approved, Coverage Starts on: 11/04/2020 12:00:00 AM, Coverage Ends on: 10/17/2021 12:00:00 AM. Questions? Contact [phone].

## 2020-11-06 NOTE — Telephone Encounter (Signed)
PA has been sent to plan again.

## 2020-11-07 NOTE — Telephone Encounter (Addendum)
LMOM informed patient the PA has been approved again. He is to contact our office if he has problems getting the medication filled.  PA Case: [id number], Status: Approved, Coverage Starts on: 10/18/2020 12:00:00 AM, Coverage Ends on: 10/17/2021 12:00:00 AM. Questions? Contact [phone].

## 2020-11-10 ENCOUNTER — Ambulatory Visit: Payer: Medicare PPO

## 2020-11-10 ENCOUNTER — Other Ambulatory Visit: Payer: Self-pay

## 2020-11-10 DIAGNOSIS — L570 Actinic keratosis: Secondary | ICD-10-CM | POA: Diagnosis not present

## 2020-11-10 MED ORDER — AMINOLEVULINIC ACID HCL 20 % EX SOLR
1.0000 "application " | Freq: Once | CUTANEOUS | Status: AC
  Administered 2020-11-10: 354 mg via TOPICAL

## 2020-11-10 NOTE — Patient Instructions (Signed)

## 2020-11-10 NOTE — Progress Notes (Signed)
1. AK (actinic keratosis) °Scalp ° °Photodynamic therapy - Scalp °Procedure discussed: discussed risks, benefits, side effects. and alternatives   °Prep: site scrubbed/prepped with acetone   °Location:  Scalp °Number of lesions:  Multiple °Type of treatment:  Blue light °Aminolevulinic Acid (see MAR for details): Levulan °Number of Levulan sticks used:  1 °Incubation time (minutes):  120 °Number of minutes under lamp:  16 °Number of seconds under lamp:  40 °Cooling:  Floor fan °Outcome: patient tolerated procedure well with no complications   °Post-procedure details: sunscreen applied and aftercare instructions given to patient   ° °Aminolevulinic Acid HCl 20 % SOLR 354 mg - Scalp ° ° °  °

## 2020-11-11 ENCOUNTER — Ambulatory Visit: Payer: Medicare PPO | Admitting: Physical Therapy

## 2020-11-11 ENCOUNTER — Encounter: Payer: Self-pay | Admitting: Physical Therapy

## 2020-11-11 DIAGNOSIS — M25651 Stiffness of right hip, not elsewhere classified: Secondary | ICD-10-CM

## 2020-11-11 DIAGNOSIS — M545 Low back pain, unspecified: Secondary | ICD-10-CM

## 2020-11-11 DIAGNOSIS — G8929 Other chronic pain: Secondary | ICD-10-CM

## 2020-11-11 NOTE — Therapy (Signed)
[facility] [address] [place], [address] Phone: [phone]   Fax:  [phone]  Physical Therapy Treatment  Patient Details  Name: Jeremiah Zhang MRN: [id number] Date of Birth: November 20, 1945 Referring Provider (PT): [name]   Encounter Date: 11/11/2020   PT End of Session - 11/11/20 1820    Visit Number 2    Number of Visits 17    Date for PT Re-Evaluation 12/31/20    Authorization Type [employer] reporting period from 11/05/2020    Authorization Time Period [employer] auth 4 visits 11/10/20-11/17/20 (coverage only during this time) Authorization [id number]    Authorization - Visit Number 1    Authorization - Number of Visits 4    Progress Note Due on Visit 10    PT Start Time 1604    PT Stop Time 1650    PT Time Calculation (min) 46 min    Activity Tolerance Patient tolerated treatment well    Behavior During Therapy [facility] for tasks assessed/performed           Past Medical History:  Diagnosis Date  . Actinic keratosis   . Anticoagulant long-term use    ELIQUIS  . Arthritis   . Basal cell carcinoma 01/27/2010   Right spinal upper back. Superficial.   . Basal cell carcinoma 11/15/2019   Left inferior antihelix post. to tragus. Superficial and nodular patterns. Excised: 11/27/2019  . Basal cell carcinoma 11/15/2019   Left mid lateral nose. Nodular and infiltrative patterns. Excised: 11/27/2019  . Basal cell carcinoma 11/27/2019   Left mid superior helix. BCC with focal sclerosis.  . Basal cell carcinoma 01/29/2020   Left mid superior ear helix  . 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 Slight atypia. Edges free.  . ED (erectile dysfunction)   . GERD (gastroesophageal reflux disease)   . Hiatal hernia   . History of DVT of lower extremity    POST OP KNEE SURGERY  . History of transient ischemic attack (TIA)    PER PT HX  ?POSSIBLE-- PT ON ELIQUIS  . Hyperlipidemia   . Hypertension    . Hypogonadism male   . OSA (obstructive sleep apnea)    per pt positive sleep study yrs ago recommended CPAP --  pt Non- compliant -- states uses Breath Rite Nasal Strips  . Peripheral neuropathy   . Prostate cancer [facility]) urologist-  dr tannenbaum/  oncologist-- dr [name]   dx 03-25-2015  T1c, Gleason 4+3  s/p  cryoablation prostate 06-02-2015/  external  radiation therapy 08-16-2016 to 09-20-2016/  scheduled for radioactive seed implants 10-22-2016  . TIA (transient ischemic attack)    ?? 08/20/2014? Eliquis 07/02/15  . Type 2 diabetes mellitus ([place])    followed by dr [name] [facility] clinic)  last A1c 8.4 on 07-27-2016 per lov note  . Umbilical hernia   . Wears glasses   . Wears hearing aid    bilateral    Past Surgical History:  Procedure Laterality Date  . COLONOSCOPY WITH PROPOFOL N/A 05/30/2017   Procedure: COLONOSCOPY WITH PROPOFOL;  Surgeon: [name], MD;  Location[facility] ENDOSCOPY;  Service: Endoscopy;  Laterality: N/A;  . CRYOABLATION N/A 06/02/2015   Procedure: CRYO ABLATION PROSTATE;  Surgeon: Carolan Clines, MD;  Location: [facility];  Service: Urology;  Laterality: N/A;  . CYSTOSCOPY WITH URETHRAL DILATATION N/A 05/04/2019   Procedure: CYSTOSCOPY WITH URETHRAL DILATATION;  Surgeon: [name], MD;  Location: [facility];  Service: Urology;  Laterality: N/A;  . ELBOW BURSA SURGERY Left Feb 1997  . prostate biopsies     x12, cryotherapy  . RADIOACTIVE SEED IMPLANT N/A 10/22/2016   Procedure: RADIOACTIVE SEED IMPLANT/BRACHYTHERAPY IMPLANT, URETHRAL STRICTURE DIALIATION;  Surgeon: Carolan Clines, MD;  Location: [facility];  Service: Urology;  Laterality: N/A;  . SHOULDER OPEN ROTATOR CUFF REPAIR Left 09-04-2014  . skin cancer L ear     Dr [name]   . TOTAL KNEE ARTHROPLASTY Bilateral right 08-13-2008  &  left 11-14-2008  . UMBILICAL HERNIA REPAIR  06-23-2012  . ureter stretch  05/02/2019   [facility] Dr [name]    There were no vitals filed for this visit.   Subjective Assessment - 11/11/20 1608    Subjective Patient reports he has 2.5/10 pain in the right lower back region (points near QL). States he feels about the same as when he came to his initial eval since he did not do much more than the eval that day. Reported functional limitations patients finds most troubling: Walking, Getting up from sitting, Getting out of bed. Reports his back pain was insideous onset and fairly sudden. Agg: used to be getting into bed and lying down and swinging R leg into bed, getting up out of a chair especially after prolonged sitting, walking over 100 yards or at [employer]. Ease: medication he was recently put on, lidocaine patch. Notes driving a car to new [place] in comfortable seat (9.5 hours) was no problem. Short stairs seem okay. Hobbies: write and publish document 10-12 times a year about relationships, marriage dating, Christian life.    Pertinent History Jeremiah Zhang is a 89yoM who reports several months of 'serious' back issues, no help after attempted chiropractic treatment. Pt denies any particular contributing event.  Pt has been as high at 8-9/10, but since starting new medication 1 month prior, reports pain has been improved to 5-6/10 instance, but does not have pain all the time. Pt denies any pain migration, denies any pain or paresthesias of the legs. Pt has had xrays done showing scoliosis, otherwise no concerning findings. Pt has been taking tylenol daily for a month now. Pt has aggravation of pain with lifting items 35lbs or more, also with getting in/out of bed. Remote hx bilat TKA. 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 His wife is a retired Therapist, sports who worked at [employer].    Currently in Pain? Yes    Pain Score 3     Aggravating Factors  Agg: used to be getting into bed and lying down and swinging R leg into bed, getting up out of a chair especially after  prolonged sitting, walking over 100 yards or at [employer],    Pain Relieving Factors Ease: medication he was recently put on, lidocaine patch,    Effect of Pain on Daily Activities Reported functional limitations patients finds most troubling:   Walking,  Getting up from sitting,  Getting out of bed            TREATMENT:   Therapeutic exercise: to centralize symptoms and improve ROM, strength, muscular endurance, and activity tolerance required for successful completion of functional activities.  - NuStep level 0 using bilateral upper and lower extremities. Seat/handle setting 10. For improved extremity mobility, muscular endurance, and activity tolerance; and to induce the analgesic effect of aerobic exercise, stimulate improved joint nutrition, and prepare body structures and systems for following interventions. x 5 minutes.  Average SPM = 52 - prone hip extension (multifidus kick), 3x10 each side - standing hip abduction 2x10 each side with narrowing hand hold.  - hooklying ower trunk rotation, x 20 each side - Education on HEP including handout   Manual therapy: to reduce pain and tissue tension, improve range of motion, neuromodulation, in order to promote improved ability to complete functional activities.  Prone:  - prone CPA along lower thoracic and entire lumbar spine, grade II-III. Reproduces pain at right lumber region with pressure to lumbar segments.  - STM to lumbar paraspinals, R > L. R tender - STM to L glute region, minimal tenderness.   Pt required multimodal cuing for proper technique and to facilitate improved neuromuscular control, strength, range of motion, and functional ability resulting in improved performance and form.  HOME EXERCISE PROGRAM Access Code: [email or web address] URL: [email or web address] Date: 11/11/2020 Prepared by: Rosita Kea  Exercises Supine Lower Trunk Rotation - 1 x daily - 1 sets - 20 reps Prone Hip Extension - 1 x daily - 3 sets - 10  reps Standing Hip Abduction with Counter Support - 1 x daily - 3 sets - 10 reps     PT Education - 11/11/20 1836    Education Details Exercise purpose/form. Self management techniques. Education on diagnosis, prognosis, POC, anatomy and physiology of current condition Education on HEP including handout    Person(s) Educated Patient    Methods Explanation;Demonstration;Tactile cues;Verbal cues    Comprehension Verbalized understanding;Returned demonstration;Verbal cues required;Tactile cues required;Need further instruction            PT Short Term Goals - 11/05/20 1711      PT SHORT TERM GOAL #1   Title After 4 weeks pt to demonstrate improved Rt SLS >10 sec.    Baseline <3sec    Time 4    Period Weeks    Status New    Target Date 12/03/20             PT Long Term Goals - 11/05/20 1712      PT LONG TERM GOAL #1   Title After 8 weeks pt to demonstrate seated Rt hip ER strength 5/5.    Baseline 4/5    Time 8    Period Weeks    Status New    Target Date 12/31/20      PT LONG TERM GOAL #2   Title After 8 weeks pt to demonstrate improved FOTO score to >70    Baseline 40/100 at eval    Time 8    Period Weeks    Status New    Target Date 12/31/20      PT LONG TERM GOAL #3   Title After 8 weeks pt to demonstrate 5xSTS <11 seconds to demonstrate improve function of hip/back extension.    Time 8    Period Weeks    Status New    Target Date 12/31/20                 Plan - 11/11/20 1831    Clinical Impression Statement Patient tolerated treatment well overall with no significant in change in symptoms by end of session. Concordant was reproduced with CPA to lumbar spine. R glute region mildly tender compared to L but not reproducing patient's chief complaint. Focused on hip and low back strength and mobility interventions and develoment of initial HEP. Patient would benefit from continued management of limiting condition by skilled physical therapist to address  remaining impairments and functional limitations  to work towards stated goals and return to PLOF or maximal functional independence.    Personal Factors and Comorbidities Age;Behavior Pattern;Fitness;Past/Current Experience    Examination-Activity Limitations Bathing;Locomotion Level;Bend;Carry;Sleep;Dressing;Stairs;Stand    Examination-Participation Restrictions Community Activity;Interpersonal Relationship;Yard Work    Stability/Clinical Decision Making Stable/Uncomplicated    Rehab Potential Good    PT Frequency 2x / week    PT Duration 8 weeks    PT Treatment/Interventions ADLs/Self Care Home Management;Gait training;Stair training;Joint Manipulations;Dry needling;Passive range of motion;Patient/family education;Therapeutic exercise;Balance training;Neuromuscular re-education;DME Instruction;Moist Heat;Electrical Stimulation    PT Next Visit Plan lwo back and hip strengthening exercises, manual therapy as needed    PT Home Exercise Plan Medbridge Access Code: [email or web address]    Consulted and Agree with Plan of Care Patient           Patient will benefit from skilled therapeutic intervention in order to improve the following deficits and impairments:  Abnormal gait,Decreased range of motion,Difficulty walking,Increased muscle spasms,Decreased activity tolerance,Decreased knowledge of precautions,Decreased balance,Decreased mobility,Decreased strength,Improper body mechanics  Visit Diagnosis: Chronic right-sided low back pain without sciatica  Stiffness of right hip, not elsewhere classified     Problem List Patient Active Problem List   Diagnosis Date Noted  . Personal history of other malignant neoplasm of skin 01/02/2020  . GERD (gastroesophageal reflux disease) 08/28/2019  . Hyperglycemia 08/28/2019  . Hyperlipidemia 08/28/2019  . Hypertension 08/28/2019  . Obesity 08/28/2019  . Myalgia due to statin 12/28/2018  . Hemorrhoids, internal, with bleeding 11/16/2018  . Erectile  dysfunction following radiation therapy 08/10/2018  . Hypogonadism in male 05/11/2018  . Diabetes mellitus without complication ([place]) 58/52/7782  . Prostate cancer ([place]) 07/14/2016  . Status post rotator cuff repair 01/05/2015  . Encounter for long-term (current) use of other medications 01/15/2014  . Benign prostatic hyperplasia without lower urinary tract symptoms 06/27/2012    Everlean Alstrom. Graylon Good, PT, DPT 11/11/20, 6:37 PM  [place] [facility] [address], [place], [address] Phone: [phone]   Fax:  [phone]  Name: Jeremiah Zhang MRN: [id number] Date of Birth: 03-10-1946

## 2020-11-13 ENCOUNTER — Other Ambulatory Visit: Payer: Self-pay

## 2020-11-13 ENCOUNTER — Ambulatory Visit: Payer: Medicare PPO | Admitting: Physical Therapy

## 2020-11-13 DIAGNOSIS — M545 Low back pain, unspecified: Secondary | ICD-10-CM

## 2020-11-13 DIAGNOSIS — M25651 Stiffness of right hip, not elsewhere classified: Secondary | ICD-10-CM

## 2020-11-13 DIAGNOSIS — G8929 Other chronic pain: Secondary | ICD-10-CM

## 2020-11-13 NOTE — Therapy (Signed)
[facility] [address], [place], [address] Phone: [phone]   Fax:  [phone]  Physical Therapy Treatment  Patient Details  Name: Jeremiah Zhang MRN: [id number] Date of Birth: [birth year] Referring Provider (PT): [name]   Encounter Date: 11/13/2020   PT End of Session - 11/13/20 1139    Visit Number 3    Number of Visits 17    Date for PT Re-Evaluation 12/31/20    Authorization Type [employer] reporting period from 11/05/2020    Authorization Time Period [employer] auth 4 visits 11/10/20-11/17/20 (coverage only during this time) Authorization #[id number]    Authorization - Visit Number 2    Authorization - Number of Visits 4    Progress Note Due on Visit 10    PT Start Time 1120    PT Stop Time 1205    PT Time Calculation (min) 45 min    Activity Tolerance Patient tolerated treatment well    Behavior During Therapy [facility] for tasks assessed/performed           Past Medical History:  Diagnosis Date  . Actinic keratosis   . Anticoagulant long-term use    ELIQUIS  . Arthritis   . Basal cell carcinoma 01/27/2010   Right spinal upper back. Superficial.   . Basal cell carcinoma 11/15/2019   Left inferior antihelix post. to tragus. Superficial and nodular patterns. Excised: 11/27/2019  . Basal cell carcinoma 11/15/2019   Left mid lateral nose. Nodular and infiltrative patterns. Excised: 11/27/2019  . Basal cell carcinoma 11/27/2019   Left mid superior helix. BCC with focal sclerosis.  . Basal cell carcinoma 01/29/2020   Left mid superior ear helix  . 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 Slight atypia. Edges free.  . ED (erectile dysfunction)   . GERD (gastroesophageal reflux disease)   . Hiatal hernia   . History of DVT of lower extremity    POST OP KNEE SURGERY  . History of transient ischemic attack (TIA)    PER PT HX  ?POSSIBLE-- PT ON ELIQUIS  . Hyperlipidemia   . Hypertension    . Hypogonadism male   . OSA (obstructive sleep apnea)    per pt positive sleep study yrs ago recommended CPAP --  pt Non- compliant -- states uses Breath Rite Nasal Strips  . Peripheral neuropathy   . Prostate cancer [facility]) urologist-  dr tannenbaum/  oncologist-- dr [name]   dx 03-25-2015  T1c, Gleason 4+3  s/p  cryoablation prostate 06-02-2015/  external  radiation therapy 08-16-2016 to 09-20-2016/  scheduled for radioactive seed implants 10-22-2016  . TIA (transient ischemic attack)    ?? 08/20/2014? Eliquis 07/02/15  . Type 2 diabetes mellitus ([place])    followed by dr [name] [facility] clinic)  last A1c 8.4 on 07-27-2016 per lov note  . Umbilical hernia   . Wears glasses   . Wears hearing aid    bilateral    Past Surgical History:  Procedure Laterality Date  . COLONOSCOPY WITH PROPOFOL N/A 05/30/2017   Procedure: COLONOSCOPY WITH PROPOFOL;  Surgeon: [name], MD;  Location: [facility] ENDOSCOPY;  Service: Endoscopy;  Laterality: N/A;  . CRYOABLATION N/A 06/02/2015   Procedure: CRYO ABLATION PROSTATE;  Surgeon: Carolan Clines, MD;  Location: [facility];  Service: Urology;  Laterality: N/A;  . CYSTOSCOPY WITH URETHRAL DILATATION N/A 05/04/2019   Procedure: CYSTOSCOPY WITH URETHRAL DILATATION;  Surgeon: [name], MD;  Location: [facility];  Service: Urology;  Laterality: N/A;  . ELBOW BURSA SURGERY Left Feb 1997  . prostate biopsies     x12, cryotherapy  . RADIOACTIVE SEED IMPLANT N/A 10/22/2016   Procedure: RADIOACTIVE SEED IMPLANT/BRACHYTHERAPY IMPLANT, URETHRAL STRICTURE DIALIATION;  Surgeon: Jethro Bolus, MD;  Location: [facility] [place];  Service: Urology;  Laterality: N/A;  . SHOULDER OPEN ROTATOR CUFF REPAIR Left 09-04-2014  . skin cancer L ear     Dr Gwen Pounds   . TOTAL KNEE ARTHROPLASTY Bilateral right 08-13-2008  &  left 11-14-2008  . UMBILICAL HERNIA REPAIR  06-23-2012  . ureter stretch  05/02/2019   [facility] Dr Berneice Heinrich    There were no vitals filed for this visit.   Subjective Assessment - 11/13/20 1123    Subjective Patinet reports he feels about the same. No better, no worse. He reports 1/10 pain at the right lumbar region upon arrival. He state he did his HEP successfully yesterday and has no questions. Hard to say if any of the manual therapy was helpful.    Pertinent History Jeremiah Zhang is a 74yoM who reports several months of 'serious' back issues, no help after attempted chiropractic treatment. Pt denies any particular contributing event.  Pt has been as high at 8-9/10, but since starting new medication 1 month prior, reports pain has been improved to 5-6/10 instance, but does not have pain all the time. Pt denies any pain migration, denies any pain or paresthesias of the legs. Pt has had xrays done showing scoliosis, otherwise no concerning findings. Pt has been taking tylenol daily for a month now. Pt has aggravation of pain with lifting items 35lbs or more, also with getting in/out of bed. Remote hx bilat TKA. Pt is a retired PhD in Psychologist, forensic at [employer]. His wife is a retired Charity fundraiser who worked at [employer].    Currently in Pain? Yes    Pain Score 1             OBJECTIVE 5TSTS: 12.21 seconds from 18.5 chair with no UE support.   TREATMENT:   Therapeutic exercise:to centralize symptoms and improve ROM, strength, muscular endurance, and activity tolerance required for successful completion of functional activities.  - NuStep level 3 using bilateral upper and lower extremities. Seat/handle setting 10. For improved extremity mobility, muscular endurance, and activity tolerance; and to induce the analgesic effect of aerobic exercise, stimulate improved joint nutrition, and prepare body structures and systems for following interventions. x 5 minutes. Average SPM = 63 - standing hip abduction 2x10 each side with narrowing hand hold.  - standing hp abduction with  red theraband around ankles and BUE support, 3x10, 1 second hold - sit to stand from chair x5 for time with no UE support:  12.21 seconds.   - sit <> stand from 18.5 inch chair, with red theraband around knees for isometric hip abduction, holding 10# DB. 3x10. Reports 6/10 RPE first set.  - seated R hip IR against red T band loop x 10 (reports pain in glute up to 7/10 during and 5/10 after so discontinued for today). - ambulation x 300 feet for active rest following ER exercise.   - Education on HEP including handout   Manual therapy: to reduce pain and tissue tension, improve range of motion, neuromodulation, in order to promote improved ability to complete functional activities.  Prone:  - STM to lumbar paraspinals, R > L. R tender - STM to L glute region, minimal tenderness.  Modality: (unbilled) Dry needling performed to right lumbar multifidi and iliocostalis to decrease pain and spasms along patient's low back and glute region with patient in prone utilizing (1) dry needle(s) .21mm x 29mm. (2) sticks to multifidus near L4 and L5 and (1) stick to liocostalis with shelf technique at approximately L4 level. Patient educated about the risks and benefits from therapy and verbally consents to treatment. Patient in prone position.  Pt required multimodal cuing for proper technique and to facilitate improved neuromuscular control, strength, range of motion, and functional ability resulting in improved performance and form.  HOME EXERCISE PROGRAM Access Code: [email or web address] URL: [email or web address] Date: 11/13/2020 Prepared by: Norton Blizzard  Exercises Supine Lower Trunk Rotation - 1 x daily - 1 sets - 20 reps Prone Hip Extension - 1 x daily - 3 sets - 10 reps Standing hip abduction with band around ankles - 1 x daily - 3 sets - 10 reps - 1 second hold    PT Education - 11/13/20 1138    Education Details Exercise purpose/form. Self management techniques.    Person(s) Educated  Patient    Methods Explanation;Demonstration;Tactile cues;Verbal cues    Comprehension Verbalized understanding;Returned demonstration;Verbal cues required;Tactile cues required;Need further instruction            PT Short Term Goals - 11/05/20 1711      PT SHORT TERM GOAL #1   Title After 4 weeks pt to demonstrate improved Rt SLS >10 sec.    Baseline <3sec    Time 4    Period Weeks    Status New    Target Date 12/03/20             PT Long Term Goals - 11/05/20 1712      PT LONG TERM GOAL #1   Title After 8 weeks pt to demonstrate seated Rt hip ER strength 5/5.    Baseline 4/5    Time 8    Period Weeks    Status New    Target Date 12/31/20      PT LONG TERM GOAL #2   Title After 8 weeks pt to demonstrate improved FOTO score to >70    Baseline 40/100 at eval    Time 8    Period Weeks    Status New    Target Date 12/31/20      PT LONG TERM GOAL #3   Title After 8 weeks pt to demonstrate 5xSTS <11 seconds to demonstrate improve function of hip/back extension.    Time 8    Period Weeks    Status New    Target Date 12/31/20                 Plan - 11/13/20 1218    Clinical Impression Statement Patient tolerated treatment well overall. Reported dull ache at right lower back after dry needling and did have pain in the R glute with R hip ER exercise. Focused on hip strengthening this session to decrease compensatory over working at the lumbar spine. Patient would benefit from continued management of limiting condition by skilled physical therapist to address remaining impairments and functional limitations to work towards stated goals and return to PLOF or maximal functional independence.    Personal Factors and Comorbidities Age;Behavior Pattern;Fitness;Past/Current Experience    Examination-Activity Limitations Bathing;Locomotion Level;Bend;Carry;Sleep;Dressing;Stairs;Stand    Examination-Participation Restrictions Community Activity;Interpersonal  Relationship;Yard Work    Stability/Clinical Decision Making Stable/Uncomplicated    Rehab Potential Good    PT Frequency 2x / week  PT Duration 8 weeks    PT Treatment/Interventions ADLs/Self Care Home Management;Gait training;Stair training;Joint Manipulations;Dry needling;Passive range of motion;Patient/family education;Therapeutic exercise;Balance training;Neuromuscular re-education;DME Instruction;Moist Heat;Electrical Stimulation    PT Next Visit Plan low back and hip strengthening exercises, manual therapy as needed    PT Home Exercise Plan Medbridge Access Code: [email or web address]    Consulted and Agree with Plan of Care Patient           Patient will benefit from skilled therapeutic intervention in order to improve the following deficits and impairments:  Abnormal gait,Decreased range of motion,Difficulty walking,Increased muscle spasms,Decreased activity tolerance,Decreased knowledge of precautions,Decreased balance,Decreased mobility,Decreased strength,Improper body mechanics  Visit Diagnosis: Chronic right-sided low back pain without sciatica  Stiffness of right hip, not elsewhere classified     Problem List Patient Active Problem List   Diagnosis Date Noted  . Personal history of other malignant neoplasm of skin 01/02/2020  . GERD (gastroesophageal reflux disease) 08/28/2019  . Hyperglycemia 08/28/2019  . Hyperlipidemia 08/28/2019  . Hypertension 08/28/2019  . Obesity 08/28/2019  . Myalgia due to statin 12/28/2018  . Hemorrhoids, internal, with bleeding 11/16/2018  . Erectile dysfunction following radiation therapy 08/10/2018  . Hypogonadism in male 05/11/2018  . Diabetes mellitus without complication ([place]) 21/30/8657  . Prostate cancer ([place]) 07/14/2016  . Status post rotator cuff repair 01/05/2015  . Encounter for long-term (current) use of other medications 01/15/2014  . Benign prostatic hyperplasia without lower urinary tract symptoms 06/27/2012    Everlean Alstrom. Graylon Good,  PT, DPT 11/13/20, 12:20 PM  [facility] [address] [place], [address] Phone: [phone]   Fax:  [phone]  Name: Jeremiah Zhang MRN: [id number] Date of Birth: 1946-07-04

## 2020-11-18 ENCOUNTER — Ambulatory Visit: Payer: Medicare Other | Attending: Family Medicine | Admitting: Physical Therapy

## 2020-11-18 ENCOUNTER — Other Ambulatory Visit: Payer: Self-pay

## 2020-11-18 ENCOUNTER — Encounter: Payer: Self-pay | Admitting: Physical Therapy

## 2020-11-18 DIAGNOSIS — M25651 Stiffness of right hip, not elsewhere classified: Secondary | ICD-10-CM | POA: Insufficient documentation

## 2020-11-18 DIAGNOSIS — M545 Low back pain, unspecified: Secondary | ICD-10-CM | POA: Insufficient documentation

## 2020-11-18 DIAGNOSIS — G8929 Other chronic pain: Secondary | ICD-10-CM | POA: Insufficient documentation

## 2020-11-18 NOTE — Therapy (Signed)
[facility] [address] [place], [address] Phone: [phone]   Fax:  [phone]  Physical Therapy Treatment  Patient Details  Name: Jeremiah Zhang MRN: [id number] Date of Birth: [birth year] Referring Provider (PT): [name]   Encounter Date: 11/18/2020   PT End of Session - 11/18/20 1129    Visit Number 4    Number of Visits 17    Date for PT Re-Evaluation 12/31/20    Authorization Type Medicare reporting period from 11/05/2020    Progress Note Due on Visit 10    PT Start Time 1120    PT Stop Time 1205    PT Time Calculation (min) 45 min    Activity Tolerance Patient tolerated treatment well    Behavior During Therapy [facility] for tasks assessed/performed           Past Medical History:  Diagnosis Date  . Actinic keratosis   . Anticoagulant long-term use    ELIQUIS  . Arthritis   . Basal cell carcinoma 01/27/2010   Right spinal upper back. Superficial.   . Basal cell carcinoma 11/15/2019   Left inferior antihelix post. to tragus. Superficial and nodular patterns. Excised: 11/27/2019  . Basal cell carcinoma 11/15/2019   Left mid lateral nose. Nodular and infiltrative patterns. Excised: 11/27/2019  . Basal cell carcinoma 11/27/2019   Left mid superior helix. BCC with focal sclerosis.  . Basal cell carcinoma 01/29/2020   Left mid superior ear helix  . 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 Slight atypia. Edges free.  . ED (erectile dysfunction)   . GERD (gastroesophageal reflux disease)   . Hiatal hernia   . History of DVT of lower extremity    POST OP KNEE SURGERY  . History of transient ischemic attack (TIA)    PER PT HX  ?POSSIBLE-- PT ON ELIQUIS  . Hyperlipidemia   . Hypertension   . Hypogonadism male   . OSA (obstructive sleep apnea)    per pt positive sleep study yrs ago recommended CPAP --  pt Non- compliant -- states uses Breath Rite Nasal Strips  . Peripheral neuropathy   .  Prostate cancer [facility]) urologist-  dr tannenbaum/  oncologist-- dr [name]   dx 03-25-2015  T1c, Gleason 4+3  s/p  cryoablation prostate 06-02-2015/  external  radiation therapy 08-16-2016 to 09-20-2016/  scheduled for radioactive seed implants 10-22-2016  . TIA (transient ischemic attack)    ?? 08/20/2014? Eliquis 07/02/15  . Type 2 diabetes mellitus ([place])    followed by dr [name] [facility] clinic)  last A1c 8.4 on 07-27-2016 per lov note  . Umbilical hernia   . Wears glasses   . Wears hearing aid    bilateral    Past Surgical History:  Procedure Laterality Date  . COLONOSCOPY WITH PROPOFOL N/A 05/30/2017   Procedure: COLONOSCOPY WITH PROPOFOL;  Surgeon: [name], MD;  Location: [facility] ENDOSCOPY;  Service: Endoscopy;  Laterality: N/A;  . CRYOABLATION N/A 06/02/2015   Procedure: CRYO ABLATION PROSTATE;  Surgeon: Carolan Clines, MD;  Location: [facility];  Service: Urology;  Laterality: N/A;  . CYSTOSCOPY WITH URETHRAL DILATATION N/A 05/04/2019   Procedure: CYSTOSCOPY WITH URETHRAL DILATATION;  Surgeon: [name], MD;  [facility];  Service: Urology;  Laterality: N/A;  . ELBOW BURSA SURGERY Left Feb 1997  . prostate biopsies     x12, cryotherapy  . RADIOACTIVE SEED IMPLANT N/A 10/22/2016   Procedure: RADIOACTIVE  SEED IMPLANT/BRACHYTHERAPY IMPLANT, URETHRAL STRICTURE DIALIATION;  Surgeon: Carolan Clines, MD;  Location: [facility];  Service: Urology;  Laterality: N/A;  . SHOULDER OPEN ROTATOR CUFF REPAIR Left 09-04-2014  . skin cancer L ear     Dr [name]   . TOTAL KNEE ARTHROPLASTY Bilateral right 08-13-2008  &  left 11-14-2008  . UMBILICAL HERNIA REPAIR  06-23-2012  . ureter stretch  05/02/2019   [facility] Dr [name]    There were no vitals filed for this visit.   Subjective Assessment - 11/18/20 1127    Subjective Patient reports he is feeling the same today. He reports 2-3/10 in his low back.  Reports he has been doing his HEP without difficulty. Remembered his pain started when he stopped going to the [facility] water aerobics.    Pertinent History Jeremiah Zhang is a 35yoM who reports several months of 'serious' back issues, no help after attempted chiropractic treatment. Pt denies any particular contributing event.  Pt has been as high at 8-9/10, but since starting new medication 1 month prior, reports pain has been improved to 5-6/10 instance, but does not have pain all the time. Pt denies any pain migration, denies any pain or paresthesias of the legs. Pt has had xrays done showing scoliosis, otherwise no concerning findings. Pt has been taking tylenol daily for a month now. Pt has aggravation of pain with lifting items 35lbs or more, also with getting in/out of bed. Remote hx bilat TKA. 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 His wife is a retired Therapist, sports who worked at [employer].    Currently in Pain? Yes    Pain Score 3             TREATMENT:  Therapeutic exercise:to centralize symptoms and improve ROM, strength, muscular endurance, and activity tolerance required for successful completion of functional activities. - NuStep level3using bilateral upper and lower extremities. Seat/handle setting 10. For improved extremity mobility, muscular endurance, and activity tolerance; and to induce the analgesic effect of aerobic exercise, stimulate improved joint nutrition, and prepare body structures and systems for following interventions. x5 minutes. Average SPM = 68  (manual/dry needling - see below).   - seated lumbar flexion theraball roll out x 20 to the front, x 20 diagonal alternating sides.  - sit <> stand from 18 inch chair, with red theraband around knees for isometric hip abduction, holding 10# DB for first set only 3x10. Increased concordant pain to 4/10.  - standing hp abduction with slight extension with red theraband around ankles and BUE  support, 3x10, 1 second hold   Manual therapy:to reduce pain and tissue tension, improve range of motion, neuromodulation, in order to promote improved ability to complete functional activities.  Prone:  - STM to R  lumbar paraspinals and QL  Modality: (unbilled) Dry needling performed to right lumbar multifidi and iliocostalis to decrease pain and spasms along patient's low back and glute region utilizing (2) dry needle(s) .67mm x 18mm. (1) sticks to R multifidus at L5 in prone and (1) stick to R QL in sidelying.  Patient educated about the risks and benefits from therapy and verbally consents to treatment.Patient in prone position.   Pt required multimodal cuing for proper technique and to facilitate improved neuromuscular control, strength, range of motion, and functional ability resulting in improved performance and form.  HOME EXERCISE PROGRAM Access Code: [email or web address] URL: [email or web address] Date: 11/13/2020 Prepared by: Rosita Kea  Exercises Supine Lower Trunk Rotation -  1 x daily - 1 sets - 20 reps Prone Hip Extension - 1 x daily - 3 sets - 10 reps Standing hip abduction with band around ankles - 1 x daily - 3 sets - 10 reps - 1 second hold    PT Education - 11/18/20 1129    Education Details Exercise purpose/form. Self management techniques.    Person(s) Educated Patient    Methods Explanation;Demonstration;Tactile cues;Verbal cues    Comprehension Verbalized understanding;Returned demonstration;Verbal cues required;Tactile cues required;Need further instruction            PT Short Term Goals - 11/05/20 1711      PT SHORT TERM GOAL #1   Title After 4 weeks pt to demonstrate improved Rt SLS >10 sec.    Baseline <3sec    Time 4    Period Weeks    Status New    Target Date 12/03/20             PT Long Term Goals - 11/05/20 1712      PT LONG TERM GOAL #1   Title After 8 weeks pt to demonstrate seated Rt hip ER strength 5/5.    Baseline  4/5    Time 8    Period Weeks    Status New    Target Date 12/31/20      PT LONG TERM GOAL #2   Title After 8 weeks pt to demonstrate improved FOTO score to >70    Baseline 40/100 at eval    Time 8    Period Weeks    Status New    Target Date 12/31/20      PT LONG TERM GOAL #3   Title After 8 weeks pt to demonstrate 5xSTS <11 seconds to demonstrate improve function of hip/back extension.    Time 8    Period Weeks    Status New    Target Date 12/31/20                 Plan - 11/18/20 1312    Clinical Impression Statement Patient tolerated treatment well overall but did not seem to feel much benefit from manual therapy or needling. Did have some reproduction of pain with sit <> stand with weight that decreased with weight removed. Reported no change in pain by end of session. Patient would benefit from continued management of limiting condition by skilled physical therapist to address remaining impairments and functional limitations to work towards stated goals and return to PLOF or maximal functional independence.    Personal Factors and Comorbidities Age;Behavior Pattern;Fitness;Past/Current Experience    Examination-Activity Limitations Bathing;Locomotion Level;Bend;Carry;Sleep;Dressing;Stairs;Stand    Examination-Participation Restrictions Community Activity;Interpersonal Relationship;Yard Work    Stability/Clinical Decision Making Stable/Uncomplicated    Rehab Potential Good    PT Frequency 2x / week    PT Duration 8 weeks    PT Treatment/Interventions ADLs/Self Care Home Management;Gait training;Stair training;Joint Manipulations;Dry needling;Passive range of motion;Patient/family education;Therapeutic exercise;Balance training;Neuromuscular re-education;DME Instruction;Moist Heat;Electrical Stimulation    PT Next Visit Plan low back and hip strengthening exercises, manual therapy as needed    PT Home Exercise Plan Medbridge Access Code: [email or web address]    Consulted and Agree  with Plan of Care Patient           Patient will benefit from skilled therapeutic intervention in order to improve the following deficits and impairments:  Abnormal gait,Decreased range of motion,Difficulty walking,Increased muscle spasms,Decreased activity tolerance,Decreased knowledge of precautions,Decreased balance,Decreased mobility,Decreased strength,Improper body mechanics  Visit Diagnosis: Chronic right-sided low back  pain without sciatica  Stiffness of right hip, not elsewhere classified     Problem List Patient Active Problem List   Diagnosis Date Noted  . Personal history of other malignant neoplasm of skin 01/02/2020  . GERD (gastroesophageal reflux disease) 08/28/2019  . Hyperglycemia 08/28/2019  . Hyperlipidemia 08/28/2019  . Hypertension 08/28/2019  . Obesity 08/28/2019  . Myalgia due to statin 12/28/2018  . Hemorrhoids, internal, with bleeding 11/16/2018  . Erectile dysfunction following radiation therapy 08/10/2018  . Hypogonadism in male 05/11/2018  . Diabetes mellitus without complication ([place]) [id number]  . Prostate cancer ([place]) 07/14/2016  . Status post rotator cuff repair 01/05/2015  . Encounter for long-term (current) use of other medications 01/15/2014  . Benign prostatic hyperplasia without lower urinary tract symptoms 06/27/2012    Everlean Alstrom. Graylon Good, PT, DPT 11/18/20, 1:13 PM  [facility] [address] [place], [address] Phone: [phone]   Fax:  [phone]  Name: ELYON YOUNGKIN MRN: [id number] Date of Birth: May 28, 1946

## 2020-11-20 ENCOUNTER — Encounter: Payer: Self-pay | Admitting: Physical Therapy

## 2020-11-20 ENCOUNTER — Ambulatory Visit: Payer: Medicare Other | Admitting: Physical Therapy

## 2020-11-20 ENCOUNTER — Other Ambulatory Visit: Payer: Self-pay

## 2020-11-20 DIAGNOSIS — M545 Low back pain, unspecified: Secondary | ICD-10-CM | POA: Diagnosis not present

## 2020-11-20 DIAGNOSIS — M25651 Stiffness of right hip, not elsewhere classified: Secondary | ICD-10-CM

## 2020-11-20 NOTE — Therapy (Signed)
[place] [facility] [address] [place], [address] Phone: [phone]   Fax:  [phone]  Physical Therapy Treatment  Patient Details  Name: Jeremiah Zhang MRN: [id number] Date of Birth: 1946-05-09 Referring Provider (PT): [name]   Encounter Date: 11/20/2020   PT End of Session - 11/20/20 1613    Visit Number 5    Number of Visits 17    Date for PT Re-Evaluation 12/31/20    Authorization Type Medicare reporting period from 11/05/2020    Progress Note Due on Visit 10    PT Start Time 1606    PT Stop Time 1651    PT Time Calculation (min) 45 min    Activity Tolerance Patient tolerated treatment well    Behavior During Therapy [facility] for tasks assessed/performed           Past Medical History:  Diagnosis Date  . Actinic keratosis   . Anticoagulant long-term use    ELIQUIS  . Arthritis   . Basal cell carcinoma 01/27/2010   Right spinal upper back. Superficial.   . Basal cell carcinoma 11/15/2019   Left inferior antihelix post. to tragus. Superficial and nodular patterns. Excised: 11/27/2019  . Basal cell carcinoma 11/15/2019   Left mid lateral nose. Nodular and infiltrative patterns. Excised: 11/27/2019  . Basal cell carcinoma 11/27/2019   Left mid superior helix. BCC with focal sclerosis.  . Basal cell carcinoma 01/29/2020   Left mid superior ear helix  . 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 Slight atypia. Edges free.  . ED (erectile dysfunction)   . GERD (gastroesophageal reflux disease)   . Hiatal hernia   . History of DVT of lower extremity    POST OP KNEE SURGERY  . History of transient ischemic attack (TIA)    PER PT HX  ?POSSIBLE-- PT ON ELIQUIS  . Hyperlipidemia   . Hypertension   . Hypogonadism male   . OSA (obstructive sleep apnea)    per pt positive sleep study yrs ago recommended CPAP --  pt Non- compliant -- states uses Breath Rite Nasal Strips  . Peripheral neuropathy   .  Prostate cancer [facility]) urologist-  dr tannenbaum/  oncologist-- dr [name]   dx 03-25-2015  T1c, Gleason 4+3  s/p  cryoablation prostate 06-02-2015/  external  radiation therapy 08-16-2016 to 09-20-2016/  scheduled for radioactive seed implants 10-22-2016  . TIA (transient ischemic attack)    ?? 08/20/2014? Eliquis 07/02/15  . Type 2 diabetes mellitus ([place])    followed by dr [name] [facility])  last A1c 8.4 on 07-27-2016 per lov note  . Umbilical hernia   . Wears glasses   . Wears hearing aid    bilateral    Past Surgical History:  Procedure Laterality Date  . COLONOSCOPY WITH PROPOFOL N/A 05/30/2017   Procedure: COLONOSCOPY WITH PROPOFOL;  Surgeon: [name], MD;  Location: [facility] ENDOSCOPY;  Service: Endoscopy;  Laterality: N/A;  . CRYOABLATION N/A 06/02/2015   Procedure: CRYO ABLATION PROSTATE;  Surgeon: Carolan Clines, MD;  Location: [facility];  Service: Urology;  Laterality: N/A;  . CYSTOSCOPY WITH URETHRAL DILATATION N/A 05/04/2019   Procedure: CYSTOSCOPY WITH URETHRAL DILATATION;  Surgeon: [name], MD;  Location: [facility];  Service: Urology;  Laterality: N/A;  . ELBOW BURSA SURGERY Left Feb 1997  . prostate biopsies     x12, cryotherapy  . RADIOACTIVE SEED IMPLANT N/A 10/22/2016   Procedure: RADIOACTIVE  SEED IMPLANT/BRACHYTHERAPY IMPLANT, URETHRAL STRICTURE DIALIATION;  Surgeon: Carolan Clines, MD;  Location: [facility];  Service: Urology;  Laterality: N/A;  . SHOULDER OPEN ROTATOR CUFF REPAIR Left 09-04-2014  . skin cancer L ear     Dr [name]   . TOTAL KNEE ARTHROPLASTY Bilateral right 08-13-2008  &  left 11-14-2008  . UMBILICAL HERNIA REPAIR  06-23-2012  . ureter stretch  05/02/2019   [facility] Dr [name]    There were no vitals filed for this visit.   Subjective Assessment - 11/20/20 1610    Subjective Patient reports he realized on the way to PT today that he hadn't thought about his  back all day. States this is the best he has felt since starting PT. He was a little achy yesterday. States his back is currently not 0/10 but very close in the familiar region in the R lower back. He did some of his HEP yesterday but not today becuase he has been too busy.    Pertinent History Jeremiah Zhang is a 24yoM who reports several months of 'serious' back issues, no help after attempted chiropractic treatment. Pt denies any particular contributing event.  Pt has been as high at 8-9/10, but since starting new medication 1 month prior, reports pain has been improved to 5-6/10 instance, but does not have pain all the time. Pt denies any pain migration, denies any pain or paresthesias of the legs. Pt has had xrays done showing scoliosis, otherwise no concerning findings. Pt has been taking tylenol daily for a month now. Pt has aggravation of pain with lifting items 35lbs or more, also with getting in/out of bed. Remote hx bilat TKA. 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 His wife is a retired Therapist, sports who worked at [employer].    Currently in Pain? Yes    Pain Score 1            TREATMENT:  Therapeutic exercise:to centralize symptoms and improve ROM, strength, muscular endurance, and activity tolerance required for successful completion of functional activities. - NuStep level4 using bilateral upper and lower extremities. Seat/handle setting 10. For improved extremity mobility, muscular endurance, and activity tolerance; and to induce the analgesic effect of aerobic exercise, stimulate improved joint nutrition, and prepare body structures and systems for following interventions. x5 minutes. Average SPM =71  (manual/dry needling - see below).   - seated lumbar flexion theraball roll out x 20 to the front, x 20 diagonal alternating sides.  - sit <>stand from 18 inch chair, with red theraband around knees for isometric hip abduction, holding 10# DB, 3x10 -  standing hp abduction with slight extension with red theraband around ankles and BUE support, 3x10, 1 second hold   Manual therapy:to reduce pain and tissue tension, improve range of motion, neuromodulation, in order to promote improved ability to complete functional activities.  Prone/sidelying:  - STM to R  lumbar paraspinals and QL  Modality: (unbilled) Dry needling performed toright lumbar region todecrease pain and spasms along patient'slow back and glute regionutilizing (2) dry needle(s) .2mm x40mm(2) sticks to R/L multifidus at L5, (1) stick to R iliocostalis at L5 in prone, and utilizing (1) dry needle .62mm x  159mm with (1) stick to right QL in sidelying. Patient educated about the risks and benefits from therapy and verbally consents to treatment.Patient in prone position.   Pt required multimodal cuing for proper technique and to facilitate improved neuromuscular control, strength, range of motion, and  functional ability resulting in improved performance and form.  HOME EXERCISE PROGRAM Access Code: [email or web address] URL: [email or web address] Date: 11/13/2020 Prepared by: Rosita Kea  Exercises Supine Lower Trunk Rotation - 1 x daily - 1 sets - 20 reps Prone Hip Extension - 1 x daily - 3 sets - 10 reps Standing hip abduction with band around ankles - 1 x daily - 3 sets - 10 reps - 1 second hold      PT Education - 11/20/20 1613    Education Details Exercise purpose/form. Self management techniques.    Person(s) Educated Patient    Methods Explanation;Demonstration;Tactile cues;Verbal cues    Comprehension Verbalized understanding;Verbal cues required;Returned demonstration;Tactile cues required;Need further instruction            PT Short Term Goals - 11/05/20 1711      PT SHORT TERM GOAL #1   Title After 4 weeks pt to demonstrate improved Rt SLS >10 sec.    Baseline <3sec    Time 4    Period Weeks    Status New    Target Date 12/03/20              PT Long Term Goals - 11/05/20 1712      PT LONG TERM GOAL #1   Title After 8 weeks pt to demonstrate seated Rt hip ER strength 5/5.    Baseline 4/5    Time 8    Period Weeks    Status New    Target Date 12/31/20      PT LONG TERM GOAL #2   Title After 8 weeks pt to demonstrate improved FOTO score to >70    Baseline 40/100 at eval    Time 8    Period Weeks    Status New    Target Date 12/31/20      PT LONG TERM GOAL #3   Title After 8 weeks pt to demonstrate 5xSTS <11 seconds to demonstrate improve function of hip/back extension.    Time 8    Period Weeks    Status New    Target Date 12/31/20                 Plan - 11/20/20 1701    Clinical Impression Statement Patient tolerated treatment well and was able to advance to holding weight for all sit <> stands today. Reports mild increase in achiness by end of session but continued to feel better than at last session. Patient would benefit from continued management of limiting condition by skilled physical therapist to address remaining impairments and functional limitations to work towards stated goals and return to PLOF or maximal functional independence.    Personal Factors and Comorbidities Age;Behavior Pattern;Fitness;Past/Current Experience    Examination-Activity Limitations Bathing;Locomotion Level;Bend;Carry;Sleep;Dressing;Stairs;Stand    Examination-Participation Restrictions Community Activity;Interpersonal Relationship;Yard Work    Stability/Clinical Decision Making Stable/Uncomplicated    Rehab Potential Good    PT Frequency 2x / week    PT Duration 8 weeks    PT Treatment/Interventions ADLs/Self Care Home Management;Gait training;Stair training;Joint Manipulations;Dry needling;Passive range of motion;Patient/family education;Therapeutic exercise;Balance training;Neuromuscular re-education;DME Instruction;Moist Heat;Electrical Stimulation    PT Next Visit Plan low back and hip strengthening exercises,  manual therapy as needed    PT Home Exercise Plan Medbridge Access Code: [email or web address]    Consulted and Agree with Plan of Care Patient           Patient will benefit from skilled therapeutic intervention in order to improve the following deficits and impairments:  Abnormal  gait,Decreased range of motion,Difficulty walking,Increased muscle spasms,Decreased activity tolerance,Decreased knowledge of precautions,Decreased balance,Decreased mobility,Decreased strength,Improper body mechanics  Visit Diagnosis: Chronic right-sided low back pain without sciatica  Stiffness of right hip, not elsewhere classified     Problem List Patient Active Problem List   Diagnosis Date Noted  . Personal history of other malignant neoplasm of skin 01/02/2020  . GERD (gastroesophageal reflux disease) 08/28/2019  . Hyperglycemia 08/28/2019  . Hyperlipidemia 08/28/2019  . Hypertension 08/28/2019  . Obesity 08/28/2019  . Myalgia due to statin 12/28/2018  . Hemorrhoids, internal, with bleeding 11/16/2018  . Erectile dysfunction following radiation therapy 08/10/2018  . Hypogonadism in male 05/11/2018  . Diabetes mellitus without complication ([place]) [id number]  . Prostate cancer ([place]) 07/14/2016  . Status post rotator cuff repair 01/05/2015  . Encounter for long-term (current) use of other medications 01/15/2014  . Benign prostatic hyperplasia without lower urinary tract symptoms 06/27/2012    Everlean Alstrom. Graylon Good, PT, DPT 11/20/20, 5:02 PM  [place] [facility] [address] [place], [address] Phone: [phone]   Fax:  [phone]  Name: HORRACE LOEWEN MRN: [id number] Date of Birth: November 12, 1945

## 2020-11-24 ENCOUNTER — Other Ambulatory Visit: Payer: Self-pay

## 2020-11-24 ENCOUNTER — Ambulatory Visit: Payer: Medicare Other | Admitting: Physical Therapy

## 2020-11-24 ENCOUNTER — Encounter: Payer: Self-pay | Admitting: Physical Therapy

## 2020-11-24 DIAGNOSIS — M545 Low back pain, unspecified: Secondary | ICD-10-CM | POA: Diagnosis not present

## 2020-11-24 DIAGNOSIS — M25651 Stiffness of right hip, not elsewhere classified: Secondary | ICD-10-CM

## 2020-11-24 DIAGNOSIS — G8929 Other chronic pain: Secondary | ICD-10-CM

## 2020-11-24 NOTE — Therapy (Signed)
[place] [facility] [address] [place], [address] Phone: [phone]   Fax:  [phone]  Physical Therapy Treatment  Patient Details  Name: Jeremiah Zhang MRN: [id number] Date of Birth: 1945/10/22 Referring Provider (PT): [name]   Encounter Date: 11/24/2020   PT End of Session - 11/24/20 0954    Visit Number 6    Number of Visits 17    Date for PT Re-Evaluation 12/31/20    Authorization Type Medicare reporting period from 11/05/2020    Progress Note Due on Visit 10    PT Start Time 0945    PT Stop Time 1025    PT Time Calculation (min) 40 min    Activity Tolerance Patient tolerated treatment well    Behavior During Therapy [facility] for tasks assessed/performed           Past Medical History:  Diagnosis Date  . Actinic keratosis   . Anticoagulant long-term use    ELIQUIS  . Arthritis   . Basal cell carcinoma 01/27/2010   Right spinal upper back. Superficial.   . Basal cell carcinoma 11/15/2019   Left inferior antihelix post. to tragus. Superficial and nodular patterns. Excised: 11/27/2019  . Basal cell carcinoma 11/15/2019   Left mid lateral nose. Nodular and infiltrative patterns. Excised: 11/27/2019  . Basal cell carcinoma 11/27/2019   Left mid superior helix. BCC with focal sclerosis.  . Basal cell carcinoma 01/29/2020   Left mid superior ear helix  . 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 Slight atypia. Edges free.  . ED (erectile dysfunction)   . GERD (gastroesophageal reflux disease)   . Hiatal hernia   . History of DVT of lower extremity    POST OP KNEE SURGERY  . History of transient ischemic attack (TIA)    PER PT HX  ?POSSIBLE-- PT ON ELIQUIS  . Hyperlipidemia   . Hypertension   . Hypogonadism male   . OSA (obstructive sleep apnea)    per pt positive sleep study yrs ago recommended CPAP --  pt Non- compliant -- states uses Breath Rite Nasal Strips  . Peripheral neuropathy   .  Prostate cancer [facility]) urologist-  dr tannenbaum/  oncologist-- dr [name]   dx 03-25-2015  T1c, Gleason 4+3  s/p  cryoablation prostate 06-02-2015/  external  radiation therapy 08-16-2016 to 09-20-2016/  scheduled for radioactive seed implants 10-22-2016  . TIA (transient ischemic attack)    ?? 08/20/2014? Eliquis 07/02/15  . Type 2 diabetes mellitus ([place])    followed by dr [name] [facility])  last A1c 8.4 on 07-27-2016 per lov note  . Umbilical hernia   . Wears glasses   . Wears hearing aid    bilateral    Past Surgical History:  Procedure Laterality Date  . COLONOSCOPY WITH PROPOFOL N/A 05/30/2017   Procedure: COLONOSCOPY WITH PROPOFOL;  Surgeon: [name], MD;  Location: [facility] ENDOSCOPY;  Service: Endoscopy;  Laterality: N/A;  . CRYOABLATION N/A 06/02/2015   Procedure: CRYO ABLATION PROSTATE;  Surgeon: Carolan Clines, MD;  Location: [facility];  Service: Urology;  Laterality: N/A;  . CYSTOSCOPY WITH URETHRAL DILATATION N/A 05/04/2019   Procedure: CYSTOSCOPY WITH URETHRAL DILATATION;  Surgeon: [name], MD;  Location: [facility];  Service: Urology;  Laterality: N/A;  . ELBOW BURSA SURGERY Left Feb 1997  . prostate biopsies     x12, cryotherapy  . RADIOACTIVE SEED IMPLANT N/A 10/22/2016   Procedure: RADIOACTIVE  SEED IMPLANT/BRACHYTHERAPY IMPLANT, URETHRAL STRICTURE DIALIATION;  Surgeon: Carolan Clines, MD;  Location: [facility];  Service: Urology;  Laterality: N/A;  . SHOULDER OPEN ROTATOR CUFF REPAIR Left 09-04-2014  . skin cancer L ear     Dr [name]   . TOTAL KNEE ARTHROPLASTY Bilateral right 08-13-2008  &  left 11-14-2008  . UMBILICAL HERNIA REPAIR  06-23-2012  . ureter stretch  05/02/2019   [facility] Dr [name]    There were no vitals filed for this visit.   Subjective Assessment - 11/24/20 0948    Subjective Patinet reports his right low back pain is 4/10 after he had sudden sharp pain at  the usual spot when in his bed bed lifting his right leg to roll to the right.  His pain was elevated following but he was able to go back to sleep eventually. Before this he had been feeling 0-2/10 pain since last session.    Pertinent History Jeremiah Zhang is a 71yoM who reports several months of 'serious' back issues, no help after attempted chiropractic treatment. Pt denies any particular contributing event.  Pt has been as high at 8-9/10, but since starting new medication 1 month prior, reports pain has been improved to 5-6/10 instance, but does not have pain all the time. Pt denies any pain migration, denies any pain or paresthesias of the legs. Pt has had xrays done showing scoliosis, otherwise no concerning findings. Pt has been taking tylenol daily for a month now. Pt has aggravation of pain with lifting items 35lbs or more, also with getting in/out of bed. Remote hx bilat TKA. 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 His wife is a retired Therapist, sports who worked at [employer].    Currently in Pain? Yes    Pain Score 4     Pain Location Back    Pain Orientation Right           TREATMENT:  Therapeutic exercise:to centralize symptoms and improve ROM, strength, muscular endurance, and activity tolerance required for successful completion of functional activities. - NuStep level0-4 using bilateral upper and lower extremities. Seat/handle setting 10. For improved extremity mobility, muscular endurance, and activity tolerance; and to induce the analgesic effect of aerobic exercise, stimulate improved joint nutrition, and prepare body structures and systems for following interventions. x5 minutes. Average SPM =78  (manual/dry needling - see below).    - seated lumbar flexion theraball roll out x 20 to the front, x 20 diagonal alternating sides.  Manual therapy:to reduce pain and tissue tension, improve range of motion, neuromodulation, in order to promote improved  ability to complete functional activities.  Prone/sidelying:  - STM toRlumbar paraspinals and QL - CPA with and without KE wedge to lower thoracic and lumbar spine, grade II-IV reproduction of symptoms with CPA at ~ T 11 to L3.   Modality: (unbilled) Dry needling performed toright lumbar region todecrease pain and spasms along patient'slow back and glute regionutilizing (2) dry needle(s) .15mm x89mm(3) sticks toR/LmultifidusatL5, and R multifidus at L1 (bony backdrop felt clearly) (1) stick toR iliocostalis at L5 in prone, and utilizing (1) dry needle .33mm x  124mm with (1) stick to right QL in sidelying.Patient educated about the risks and benefits from therapy and verbally consents to treatment.   Pt required multimodal cuing for proper technique and to facilitate improved neuromuscular control, strength, range of motion, and functional ability resulting in improved performance and form.  HOME EXERCISE PROGRAM Access Code: [email or web address]  URL: [email or web address] Date: 11/13/2020 Prepared by: Rosita Kea  Exercises Supine Lower Trunk Rotation - 1 x daily - 1 sets - 20 reps Prone Hip Extension - 1 x daily - 3 sets - 10 reps Standing hip abduction with band around ankles - 1 x daily - 3 sets - 10 reps - 1 second hold    PT Education - 11/24/20 0954    Education Details Exercise purpose/form. Self management techniques.    Person(s) Educated Patient    Methods Explanation;Demonstration;Tactile cues;Verbal cues    Comprehension Verbalized understanding;Returned demonstration;Verbal cues required;Tactile cues required;Need further instruction            PT Short Term Goals - 11/05/20 1711      PT SHORT TERM GOAL #1   Title After 4 weeks pt to demonstrate improved Rt SLS >10 sec.    Baseline <3sec    Time 4    Period Weeks    Status New    Target Date 12/03/20             PT Long Term Goals - 11/05/20 1712      PT LONG TERM GOAL #1   Title  After 8 weeks pt to demonstrate seated Rt hip ER strength 5/5.    Baseline 4/5    Time 8    Period Weeks    Status New    Target Date 12/31/20      PT LONG TERM GOAL #2   Title After 8 weeks pt to demonstrate improved FOTO score to >70    Baseline 40/100 at eval    Time 8    Period Weeks    Status New    Target Date 12/31/20      PT LONG TERM GOAL #3   Title After 8 weeks pt to demonstrate 5xSTS <11 seconds to demonstrate improve function of hip/back extension.    Time 8    Period Weeks    Status New    Target Date 12/31/20                 Plan - 11/24/20 1032    Clinical Impression Statement Patient tolerated treatment well overall and reports decreased pain to 1-2/10 by end of session. Focused more on manual therapy, pain relief this session. Patient would benefit from continued management of limiting condition by skilled physical therapist to address remaining impairments and functional limitations to work towards stated goals and return to PLOF or maximal functional independence.    Personal Factors and Comorbidities Age;Behavior Pattern;Fitness;Past/Current Experience    Examination-Activity Limitations Bathing;Locomotion Level;Bend;Carry;Sleep;Dressing;Stairs;Stand    Examination-Participation Restrictions Community Activity;Interpersonal Relationship;Yard Work    Stability/Clinical Decision Making Stable/Uncomplicated    Rehab Potential Good    PT Frequency 2x / week    PT Duration 8 weeks    PT Treatment/Interventions ADLs/Self Care Home Management;Gait training;Stair training;Joint Manipulations;Dry needling;Passive range of motion;Patient/family education;Therapeutic exercise;Balance training;Neuromuscular re-education;DME Instruction;Moist Heat;Electrical Stimulation    PT Next Visit Plan low back and hip strengthening exercises, manual therapy as needed    PT Home Exercise Plan Medbridge Access Code: [email or web address]    Consulted and Agree with Plan of Care Patient            Patient will benefit from skilled therapeutic intervention in order to improve the following deficits and impairments:  Abnormal gait,Decreased range of motion,Difficulty walking,Increased muscle spasms,Decreased activity tolerance,Decreased knowledge of precautions,Decreased balance,Decreased mobility,Decreased strength,Improper body mechanics  Visit Diagnosis: Chronic right-sided low back pain without sciatica  Stiffness  of right hip, not elsewhere classified     Problem List Patient Active Problem List   Diagnosis Date Noted  . Personal history of other malignant neoplasm of skin 01/02/2020  . GERD (gastroesophageal reflux disease) 08/28/2019  . Hyperglycemia 08/28/2019  . Hyperlipidemia 08/28/2019  . Hypertension 08/28/2019  . Obesity 08/28/2019  . Myalgia due to statin 12/28/2018  . Hemorrhoids, internal, with bleeding 11/16/2018  . Erectile dysfunction following radiation therapy 08/10/2018  . Hypogonadism in male 05/11/2018  . Diabetes mellitus without complication ([place]) 16/07/9603  . Prostate cancer ([place]) 07/14/2016  . Status post rotator cuff repair 01/05/2015  . Encounter for long-term (current) use of other medications 01/15/2014  . Benign prostatic hyperplasia without lower urinary tract symptoms 06/27/2012    Everlean Alstrom. Graylon Good, PT, DPT 11/24/20, 10:33 AM  [place] [facility] [address] [place], [address] Phone: [phone]   Fax:  [phone]  Name: FINIS HENDRICKSEN MRN: [id number] Date of Birth: [birth year]

## 2020-11-26 ENCOUNTER — Ambulatory Visit: Payer: Medicare Other | Admitting: Physical Therapy

## 2020-11-26 ENCOUNTER — Other Ambulatory Visit: Payer: Self-pay

## 2020-11-26 ENCOUNTER — Encounter: Payer: Self-pay | Admitting: Physical Therapy

## 2020-11-26 DIAGNOSIS — M25651 Stiffness of right hip, not elsewhere classified: Secondary | ICD-10-CM

## 2020-11-26 DIAGNOSIS — M545 Low back pain, unspecified: Secondary | ICD-10-CM | POA: Diagnosis not present

## 2020-11-26 NOTE — Therapy (Signed)
[place] [facility] [address], [place], [address] Phone: [phone]   Fax:  [phone]  Physical Therapy Treatment  Patient Details  Name: ROSAIRE Zhang MRN: [id number] Date of Birth: 1946/07/26 Referring Provider (PT): [name]   Encounter Date: 11/26/2020   PT End of Session - 11/26/20 1434    Visit Number 7    Number of Visits 17    Date for PT Re-Evaluation 12/31/20    Authorization Type Medicare reporting period from 11/05/2020    Progress Note Due on Visit 10    PT Start Time 1432    PT Stop Time 1510    PT Time Calculation (min) 38 min    Activity Tolerance Patient tolerated treatment well    Behavior During Therapy [facility] for tasks assessed/performed           Past Medical History:  Diagnosis Date  . Actinic keratosis   . Anticoagulant long-term use    ELIQUIS  . Arthritis   . Basal cell carcinoma 01/27/2010   Right spinal upper back. Superficial.   . Basal cell carcinoma 11/15/2019   Left inferior antihelix post. to tragus. Superficial and nodular patterns. Excised: 11/27/2019  . Basal cell carcinoma 11/15/2019   Left mid lateral nose. Nodular and infiltrative patterns. Excised: 11/27/2019  . Basal cell carcinoma 11/27/2019   Left mid superior helix. BCC with focal sclerosis.  . Basal cell carcinoma 01/29/2020   Left mid superior ear helix  . 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 Slight atypia. Edges free.  . ED (erectile dysfunction)   . GERD (gastroesophageal reflux disease)   . Hiatal hernia   . History of DVT of lower extremity    POST OP KNEE SURGERY  . History of transient ischemic attack (TIA)    PER PT HX  ?POSSIBLE-- PT ON ELIQUIS  . Hyperlipidemia   . Hypertension   . Hypogonadism male   . OSA (obstructive sleep apnea)    per pt positive sleep study yrs ago recommended CPAP --  pt Non- compliant -- states uses Breath Rite Nasal Strips  . Peripheral neuropathy   .  Prostate cancer [facility]) urologist-  dr tannenbaum/  oncologist-- dr [name]   dx 03-25-2015  T1c, Gleason 4+3  s/p  cryoablation prostate 06-02-2015/  external  radiation therapy 08-16-2016 to 09-20-2016/  scheduled for radioactive seed implants 10-22-2016  . TIA (transient ischemic attack)    ?? 08/20/2014? Eliquis 07/02/15  . Type 2 diabetes mellitus ([place])    followed by dr [name] [facility] clinic)  last A1c 8.4 on 07-27-2016 per lov note  . Umbilical hernia   . Wears glasses   . Wears hearing aid    bilateral    Past Surgical History:  Procedure Laterality Date  . COLONOSCOPY WITH PROPOFOL N/A 05/30/2017   Procedure: COLONOSCOPY WITH PROPOFOL;  Surgeon: [name], MD;  Location: [facility] ENDOSCOPY;  Service: Endoscopy;  Laterality: N/A;  . CRYOABLATION N/A 06/02/2015   Procedure: CRYO ABLATION PROSTATE;  Surgeon: Carolan Clines, MD;  Location: [facility];  Service: Urology;  Laterality: N/A;  . CYSTOSCOPY WITH URETHRAL DILATATION N/A 05/04/2019   Procedure: CYSTOSCOPY WITH URETHRAL DILATATION;  Surgeon: [name], MD;  Location: [facility];  Service: Urology;  Laterality: N/A;  . ELBOW BURSA SURGERY Left Feb 1997  . prostate biopsies     x12, cryotherapy  . RADIOACTIVE SEED IMPLANT N/A 10/22/2016   Procedure: RADIOACTIVE  SEED IMPLANT/BRACHYTHERAPY IMPLANT, URETHRAL STRICTURE DIALIATION;  Surgeon: Carolan Clines, MD;  Location: [facility];  Service: Urology;  Laterality: N/A;  . SHOULDER OPEN ROTATOR CUFF REPAIR Left 09-04-2014  . skin cancer L ear     Dr [name]   . TOTAL KNEE ARTHROPLASTY Bilateral right 08-13-2008  &  left 11-14-2008  . UMBILICAL HERNIA REPAIR  06-23-2012  . ureter stretch  05/02/2019   [facility] Dr [name]    There were no vitals filed for this visit.   Subjective Assessment - 11/26/20 1436    Subjective Patient reports 0-2 pain intermittantly in the familiar region at the right low back  over the last two days. States rolling in the bed is a little better. He has gotten a little exercise in since lat time including the standing hip exercises.    Pertinent History Rawlin "Rich" Grein is a 34yoM who reports several months of 'serious' back issues, no help after attempted chiropractic treatment. Pt denies any particular contributing event.  Pt has been as high at 8-9/10, but since starting new medication 1 month prior, reports pain has been improved to 5-6/10 instance, but does not have pain all the time. Pt denies any pain migration, denies any pain or paresthesias of the legs. Pt has had xrays done showing scoliosis, otherwise no concerning findings. Pt has been taking tylenol daily for a month now. Pt has aggravation of pain with lifting items 35lbs or more, also with getting in/out of bed. Remote hx bilat TKA. 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 His wife is a retired Therapist, sports who worked at [employer].    Currently in Pain? Yes    Pain Score 2              TREATMENT:  Therapeutic exercise:to centralize symptoms and improve ROM, strength, muscular endurance, and activity tolerance required for successful completion of functional activities. - NuStep level4using bilateral upper and lower extremities. Seat/handle setting 10. For improved extremity mobility, muscular endurance, and activity tolerance; and to induce the analgesic effect of aerobic exercise, stimulate improved joint nutrition, and prepare body structures and systems for following interventions. x5 minutes. Average SPM =73 - seated lumbar flexion theraball roll out x 20 to the front, x 20 diagonal alternating sides. - sit <>stand from 17 inch chair, with red theraband around knees for isometric hip abduction, holding 10# DB, 3x10-15. pain science education interspersed throughout sets of sit <> stand.  - side stepping with red theraband around ankles 3x20 feet each direction with CGA for  safety.   Manual therapy:to reduce pain and tissue tension, improve range of motion, neuromodulation, in order to promote improved ability to complete functional activities.  Prone:  - STM toRlumbar paraspinals and QL region, upper glutes.  - UPA to R lumbar segements, grade II-IV    Pt required multimodal cuing for proper technique and to facilitate improved neuromuscular control, strength, range of motion, and functional ability resulting in improved performance and form.  HOME EXERCISE PROGRAM Access Code: [email or web address] URL: [email or web address] Date: 11/13/2020 Prepared by: Rosita Kea  Exercises Supine Lower Trunk Rotation - 1 x daily - 1 sets - 20 reps Prone Hip Extension - 1 x daily - 3 sets - 10 reps Standing hip abduction with band around ankles - 1 x daily - 3 sets - 10 reps - 1 second hold     PT Education - 11/26/20 1850    Education Details Exercise purpose/form. Self management  techniques.    Person(s) Educated Patient    Methods Explanation;Demonstration;Tactile cues;Verbal cues    Comprehension Verbalized understanding;Returned demonstration;Verbal cues required;Tactile cues required;Need further instruction            PT Short Term Goals - 11/05/20 1711      PT SHORT TERM GOAL #1   Title After 4 weeks pt to demonstrate improved Rt SLS >10 sec.    Baseline <3sec    Time 4    Period Weeks    Status New    Target Date 12/03/20             PT Long Term Goals - 11/05/20 1712      PT LONG TERM GOAL #1   Title After 8 weeks pt to demonstrate seated Rt hip ER strength 5/5.    Baseline 4/5    Time 8    Period Weeks    Status New    Target Date 12/31/20      PT LONG TERM GOAL #2   Title After 8 weeks pt to demonstrate improved FOTO score to >70    Baseline 40/100 at eval    Time 8    Period Weeks    Status New    Target Date 12/31/20      PT LONG TERM GOAL #3   Title After 8 weeks pt to demonstrate 5xSTS <11 seconds to  demonstrate improve function of hip/back extension.    Time 8    Period Weeks    Status New    Target Date 12/31/20                 Plan - 11/26/20 1856    Clinical Impression Statement Patient tolerated treatment well and reported decreased symptoms at conclusion of session. Reproduction of symptoms with pressure to R lower lumbar parasipnals/upper glute and mild with side stepping. Continued to work on lower quarter strength and spinal mobility. Patient would benefit from continued management of limiting condition by skilled physical therapist to address remaining impairments and functional limitations to work towards stated goals and return to PLOF or maximal functional independence.    Personal Factors and Comorbidities Age;Behavior Pattern;Fitness;Past/Current Experience    Examination-Activity Limitations Bathing;Locomotion Level;Bend;Carry;Sleep;Dressing;Stairs;Stand    Examination-Participation Restrictions Community Activity;Interpersonal Relationship;Yard Work    Stability/Clinical Decision Making Stable/Uncomplicated    Rehab Potential Good    PT Frequency 2x / week    PT Duration 8 weeks    PT Treatment/Interventions ADLs/Self Care Home Management;Gait training;Stair training;Joint Manipulations;Dry needling;Passive range of motion;Patient/family education;Therapeutic exercise;Balance training;Neuromuscular re-education;DME Instruction;Moist Heat;Electrical Stimulation    PT Next Visit Plan low back and hip strengthening exercises, manual therapy as needed    PT Home Exercise Plan Medbridge Access Code: [email or web address]    Consulted and Agree with Plan of Care Patient           Patient will benefit from skilled therapeutic intervention in order to improve the following deficits and impairments:  Abnormal gait,Decreased range of motion,Difficulty walking,Increased muscle spasms,Decreased activity tolerance,Decreased knowledge of precautions,Decreased balance,Decreased  mobility,Decreased strength,Improper body mechanics  Visit Diagnosis: Chronic right-sided low back pain without sciatica  Stiffness of right hip, not elsewhere classified     Problem List Patient Active Problem List   Diagnosis Date Noted  . Personal history of other malignant neoplasm of skin 01/02/2020  . GERD (gastroesophageal reflux disease) 08/28/2019  . Hyperglycemia 08/28/2019  . Hyperlipidemia 08/28/2019  . Hypertension 08/28/2019  . Obesity 08/28/2019  . Myalgia due to statin  12/28/2018  . Hemorrhoids, internal, with bleeding 11/16/2018  . Erectile dysfunction following radiation therapy 08/10/2018  . Hypogonadism in male 05/11/2018  . Diabetes mellitus without complication ([place]) 15/95/3967  . Prostate cancer ([place]) 07/14/2016  . Status post rotator cuff repair 01/05/2015  . Encounter for long-term (current) use of other medications 01/15/2014  . Benign prostatic hyperplasia without lower urinary tract symptoms 06/27/2012    Everlean Alstrom. Graylon Good, PT, DPT 11/26/20, 6:57 PM  [place] [facility] [address], [place], [address] Phone: [phone]   Fax:  [phone]  Name: JASEAN AMBROSIA MRN: [id number] Date of Birth: 02/24/1946

## 2020-12-01 ENCOUNTER — Ambulatory Visit: Payer: Medicare Other | Admitting: Physical Therapy

## 2020-12-01 ENCOUNTER — Other Ambulatory Visit: Payer: Self-pay

## 2020-12-01 ENCOUNTER — Encounter: Payer: Self-pay | Admitting: Physical Therapy

## 2020-12-01 DIAGNOSIS — M25651 Stiffness of right hip, not elsewhere classified: Secondary | ICD-10-CM

## 2020-12-01 DIAGNOSIS — M545 Low back pain, unspecified: Secondary | ICD-10-CM | POA: Diagnosis not present

## 2020-12-01 NOTE — Therapy (Signed)
[place] [facility] [address] [place], [address] Phone: [phone]   Fax:  [phone]  Physical Therapy Treatment  Patient Details  Name: Jeremiah Zhang MRN: [id number] Date of Birth: [birth year] Referring Provider (PT): [name]   Encounter Date: 12/01/2020   PT End of Session - 12/01/20 1519    Visit Number 8    Number of Visits 17    Date for PT Re-Evaluation 12/31/20    Authorization Type Medicare reporting period from 11/05/2020    Progress Note Due on Visit 10    PT Start Time 1519    PT Stop Time 1557    PT Time Calculation (min) 38 min    Activity Tolerance Patient tolerated treatment well    Behavior During Therapy [facility] for tasks assessed/performed           Past Medical History:  Diagnosis Date  . Actinic keratosis   . Anticoagulant long-term use    ELIQUIS  . Arthritis   . Basal cell carcinoma 01/27/2010   Right spinal upper back. Superficial.   . Basal cell carcinoma 11/15/2019   Left inferior antihelix post. to tragus. Superficial and nodular patterns. Excised: 11/27/2019  . Basal cell carcinoma 11/15/2019   Left mid lateral nose. Nodular and infiltrative patterns. Excised: 11/27/2019  . Basal cell carcinoma 11/27/2019   Left mid superior helix. BCC with focal sclerosis.  . Basal cell carcinoma 01/29/2020   Left mid superior ear helix  . 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 Slight atypia. Edges free.  . ED (erectile dysfunction)   . GERD (gastroesophageal reflux disease)   . Hiatal hernia   . History of DVT of lower extremity    POST OP KNEE SURGERY  . History of transient ischemic attack (TIA)    PER PT HX  ?POSSIBLE-- PT ON ELIQUIS  . Hyperlipidemia   . Hypertension   . Hypogonadism male   . OSA (obstructive sleep apnea)    per pt positive sleep study yrs ago recommended CPAP --  pt Non- compliant -- states uses Breath Rite Nasal Strips  . Peripheral neuropathy   .  Prostate cancer [facility]) urologist-  dr tannenbaum/  oncologist-- dr [name]   dx 03-25-2015  T1c, Gleason 4+3  s/p  cryoablation prostate 06-02-2015/  external  radiation therapy 08-16-2016 to 09-20-2016/  scheduled for radioactive seed implants 10-22-2016  . TIA (transient ischemic attack)    ?? 08/20/2014? Eliquis 07/02/15  . Type 2 diabetes mellitus ([place])    followed by dr [name] [facility] clinic)  last A1c 8.4 on 07-27-2016 per lov note  . Umbilical hernia   . Wears glasses   . Wears hearing aid    bilateral    Past Surgical History:  Procedure Laterality Date  . COLONOSCOPY WITH PROPOFOL N/A 05/30/2017   Procedure: COLONOSCOPY WITH PROPOFOL;  Surgeon: [name], MD;  Location: [facility] ENDOSCOPY;  Service: Endoscopy;  Laterality: N/A;  . CRYOABLATION N/A 06/02/2015   Procedure: CRYO ABLATION PROSTATE;  Surgeon: Carolan Clines, MD;  Location: [facility];  Service: Urology;  Laterality: N/A;  . CYSTOSCOPY WITH URETHRAL DILATATION N/A 05/04/2019   Procedure: CYSTOSCOPY WITH URETHRAL DILATATION;  Surgeon: [name], MD;  Location: [facility];  Service: Urology;  Laterality: N/A;  . ELBOW BURSA SURGERY Left Feb 1997  . prostate biopsies     x12, cryotherapy  . RADIOACTIVE SEED IMPLANT N/A 10/22/2016   Procedure: RADIOACTIVE  SEED IMPLANT/BRACHYTHERAPY IMPLANT, URETHRAL STRICTURE DIALIATION;  Surgeon: Carolan Clines, MD;  Location: [facility];  Service: Urology;  Laterality: N/A;  . SHOULDER OPEN ROTATOR CUFF REPAIR Left 09-04-2014  . skin cancer L ear     Dr [name]   . TOTAL KNEE ARTHROPLASTY Bilateral right 08-13-2008  &  left 11-14-2008  . UMBILICAL HERNIA REPAIR  06-23-2012  . ureter stretch  05/02/2019   [facility] Dr [name]    There were no vitals filed for this visit.   Subjective Assessment - 12/01/20 1522    Subjective Patient arrived ready to start at 3:19pm. Reports he has had improved pain 0-2/10 in  the low back that he feels is related to the deep STM performed at last treatment session. States he has been too busy to complete his HEP since last session but brought a valentines book he made for the PT.    Pertinent History Jeremiah Zhang is a 32yoM who reports several months of 'serious' back issues, no help after attempted chiropractic treatment. Pt denies any particular contributing event.  Pt has been as high at 8-9/10, but since starting new medication 1 month prior, reports pain has been improved to 5-6/10 instance, but does not have pain all the time. Pt denies any pain migration, denies any pain or paresthesias of the legs. Pt has had xrays done showing scoliosis, otherwise no concerning findings. Pt has been taking tylenol daily for a month now. Pt has aggravation of pain with lifting items 35lbs or more, also with getting in/out of bed. Remote hx bilat TKA. 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 His wife is a retired Therapist, sports who worked at [employer].    Currently in Pain? No/denies    Pain Score --   not currently but ranges 0-2/10         TREATMENT:  Therapeutic exercise:to centralize symptoms and improve ROM, strength, muscular endurance, and activity tolerance required for successful completion of functional activities. - NuStep level5using bilateral upper and lower extremities. Seat/handle setting 10. For improved extremity mobility, muscular endurance, and activity tolerance; and to induce the analgesic effect of aerobic exercise, stimulate improved joint nutrition, and prepare body structures and systems for following interventions. x5 minutes. Average SPM =73 - seated lumbar flexion theraball roll out x 20 to the front, x 20 diagonal alternating sides. - sit <>stand from 17 inch chair, with green theraband around knees for isometric hip abduction, holding 15# DB, 3x10. Cuing to keep from pushing off legs with UEs.  - side stepping with green  theraband around ankles 3x20 feet each direction with CGA for safety.   Manual therapy:to reduce pain and tissue tension, improve range of motion, neuromodulation, in order to promote improved ability to complete functional activities. Hooklying:  - R hip joint mobilizations, grade III-IV: 2x30 seconds oscillate  with mob belt, lateral and caudal.   Prone:  - STM toRlumbar paraspinals and QL region, upper glutes.  - UPA to R lower thoracic segements, grade II-IV    Pt required multimodal cuing for proper technique and to facilitate improved neuromuscular control, strength, range of motion, and functional ability resulting in improved performance and form.  HOME EXERCISE PROGRAM Access Code: [email or web address] URL: [email or web address] Date: 11/13/2020 Prepared by: Rosita Kea  Exercises Supine Lower Trunk Rotation - 1 x daily - 1 sets - 20 reps Prone Hip Extension - 1 x daily - 3 sets - 10 reps Standing hip abduction with band  around ankles - 1 x daily - 3 sets - 10 reps - 1 second hold     PT Education - 12/01/20 1524    Education Details Exercise purpose/form. Self management techniques.    Person(s) Educated Patient    Methods Explanation;Demonstration;Tactile cues;Verbal cues    Comprehension Verbalized understanding;Returned demonstration;Verbal cues required;Tactile cues required;Need further instruction            PT Short Term Goals - 11/05/20 1711      PT SHORT TERM GOAL #1   Title After 4 weeks pt to demonstrate improved Rt SLS >10 sec.    Baseline <3sec    Time 4    Period Weeks    Status New    Target Date 12/03/20             PT Long Term Goals - 11/05/20 1712      PT LONG TERM GOAL #1   Title After 8 weeks pt to demonstrate seated Rt hip ER strength 5/5.    Baseline 4/5    Time 8    Period Weeks    Status New    Target Date 12/31/20      PT LONG TERM GOAL #2   Title After 8 weeks pt to demonstrate improved FOTO score to >70     Baseline 40/100 at eval    Time 8    Period Weeks    Status New    Target Date 12/31/20      PT LONG TERM GOAL #3   Title After 8 weeks pt to demonstrate 5xSTS <11 seconds to demonstrate improve function of hip/back extension.    Time 8    Period Weeks    Status New    Target Date 12/31/20                 Plan - 12/01/20 1959    Clinical Impression Statement Patient tolerated treatment and seems to be improving overall. Continues to require cuing to stay on task but a little more focused today. Increased resistance in LE exercises. Patient did not notice a big difference in his symptoms immediately after hip joint mobilizations but that joint is tight. May benefit from PA mob next session. Does have strong reproduction of pain over soft tissue of right lower lumbar region. Patient would benefit from continued management of limiting condition by skilled physical therapist to address remaining impairments and functional limitations to work towards stated goals and return to PLOF or maximal functional independence.    Personal Factors and Comorbidities Age;Behavior Pattern;Fitness;Past/Current Experience    Examination-Activity Limitations Bathing;Locomotion Level;Bend;Carry;Sleep;Dressing;Stairs;Stand    Examination-Participation Restrictions Community Activity;Interpersonal Relationship;Yard Work    Stability/Clinical Decision Making Stable/Uncomplicated    Rehab Potential Good    PT Frequency 2x / week    PT Duration 8 weeks    PT Treatment/Interventions ADLs/Self Care Home Management;Gait training;Stair training;Joint Manipulations;Dry needling;Passive range of motion;Patient/family education;Therapeutic exercise;Balance training;Neuromuscular re-education;DME Instruction;Moist Heat;Electrical Stimulation    PT Next Visit Plan low back and hip strengthening exercises, manual therapy as needed    PT Home Exercise Plan Medbridge Access Code: [email or web address]    Consulted and Agree with Plan  of Care Patient           Patient will benefit from skilled therapeutic intervention in order to improve the following deficits and impairments:  Abnormal gait,Decreased range of motion,Difficulty walking,Increased muscle spasms,Decreased activity tolerance,Decreased knowledge of precautions,Decreased balance,Decreased mobility,Decreased strength,Improper body mechanics  Visit Diagnosis: Chronic right-sided low back pain  without sciatica  Stiffness of right hip, not elsewhere classified     Problem List Patient Active Problem List   Diagnosis Date Noted  . Personal history of other malignant neoplasm of skin 01/02/2020  . GERD (gastroesophageal reflux disease) 08/28/2019  . Hyperglycemia 08/28/2019  . Hyperlipidemia 08/28/2019  . Hypertension 08/28/2019  . Obesity 08/28/2019  . Myalgia due to statin 12/28/2018  . Hemorrhoids, internal, with bleeding 11/16/2018  . Erectile dysfunction following radiation therapy 08/10/2018  . Hypogonadism in male 05/11/2018  . Diabetes mellitus without complication ([place]) 88/89/1694  . Prostate cancer ([place]) 07/14/2016  . Status post rotator cuff repair 01/05/2015  . Encounter for long-term (current) use of other medications 01/15/2014  . Benign prostatic hyperplasia without lower urinary tract symptoms 06/27/2012    Everlean Alstrom. Graylon Good, PT, DPT 12/01/20, 8:00 PM  [place] [facility] [address], [place], [address] Phone: [phone]   Fax:  [phone]  Name: Jeremiah Zhang MRN: [id number] Date of Birth: Jul 12, 1946

## 2020-12-02 ENCOUNTER — Ambulatory Visit: Payer: Medicare Other | Admitting: Podiatry

## 2020-12-04 ENCOUNTER — Encounter: Payer: Medicare Other | Admitting: Physical Therapy

## 2020-12-09 ENCOUNTER — Ambulatory Visit: Payer: Medicare Other

## 2020-12-09 ENCOUNTER — Other Ambulatory Visit: Payer: Self-pay

## 2020-12-09 DIAGNOSIS — M25651 Stiffness of right hip, not elsewhere classified: Secondary | ICD-10-CM

## 2020-12-09 DIAGNOSIS — G8929 Other chronic pain: Secondary | ICD-10-CM

## 2020-12-09 DIAGNOSIS — M545 Low back pain, unspecified: Secondary | ICD-10-CM

## 2020-12-09 NOTE — Therapy (Signed)
[facility] [address] [place], [address] Phone: [phone]   Fax:  [phone]  Physical Therapy Treatment  Patient Details  Name: Jeremiah Zhang MRN: [id number] Date of Birth: 1946/09/09 Referring Provider (PT): [name]   Encounter Date: 12/09/2020   PT End of Session - 12/09/20 1256    Visit Number 9    Number of Visits 17    Date for PT Re-Evaluation 12/31/20    Authorization Type Medicare reporting period from 11/05/2020    Progress Note Due on Visit 10    PT Start Time 1115    PT Stop Time 1200    PT Time Calculation (min) 45 min    Activity Tolerance Patient tolerated treatment well    Behavior During Therapy [facility] for tasks assessed/performed           Past Medical History:  Diagnosis Date  . Actinic keratosis   . Anticoagulant long-term use    ELIQUIS  . Arthritis   . Basal cell carcinoma 01/27/2010   Right spinal upper back. Superficial.   . Basal cell carcinoma 11/15/2019   Left inferior antihelix post. to tragus. Superficial and nodular patterns. Excised: 11/27/2019  . Basal cell carcinoma 11/15/2019   Left mid lateral nose. Nodular and infiltrative patterns. Excised: 11/27/2019  . Basal cell carcinoma 11/27/2019   Left mid superior helix. BCC with focal sclerosis.  . Basal cell carcinoma 01/29/2020   Left mid superior ear helix  . 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 Slight atypia. Edges free.  . ED (erectile dysfunction)   . GERD (gastroesophageal reflux disease)   . Hiatal hernia   . History of DVT of lower extremity    POST OP KNEE SURGERY  . History of transient ischemic attack (TIA)    PER PT HX  ?POSSIBLE-- PT ON ELIQUIS  . Hyperlipidemia   . Hypertension   . Hypogonadism male   . OSA (obstructive sleep apnea)    per pt positive sleep study yrs ago recommended CPAP --  pt Non- compliant -- states uses Breath Rite Nasal Strips  . Peripheral neuropathy   .  Prostate cancer [facility]) urologist-  dr tannenbaum/  oncologist-- dr [name]   dx 03-25-2015  T1c, Gleason 4+3  s/p  cryoablation prostate 06-02-2015/  external  radiation therapy 08-16-2016 to 09-20-2016/  scheduled for radioactive seed implants 10-22-2016  . TIA (transient ischemic attack)    ?? 08/20/2014? Eliquis 07/02/15  . Type 2 diabetes mellitus ([place])    followed by dr [name] [facility] clinic)  last A1c 8.4 on 07-27-2016 per lov note  . Umbilical hernia   . Wears glasses   . Wears hearing aid    bilateral    Past Surgical History:  Procedure Laterality Date  . COLONOSCOPY WITH PROPOFOL N/A 05/30/2017   Procedure: COLONOSCOPY WITH PROPOFOL;  Surgeon: [name], MD;  Location: [facility] ENDOSCOPY;  Service: Endoscopy;  Laterality: N/A;  . CRYOABLATION N/A 06/02/2015   Procedure: CRYO ABLATION PROSTATE;  Surgeon: Carolan Clines, MD;  Location: [facility];  Service: Urology;  Laterality: N/A;  . CYSTOSCOPY WITH URETHRAL DILATATION N/A 05/04/2019   Procedure: CYSTOSCOPY WITH URETHRAL DILATATION;  Surgeon: [name], MD;  Location: [facility];  Service: Urology;  Laterality: N/A;  . ELBOW BURSA SURGERY Left Feb 1997  . prostate biopsies     x12, cryotherapy  . RADIOACTIVE SEED IMPLANT N/A 10/22/2016   Procedure: RADIOACTIVE  SEED IMPLANT/BRACHYTHERAPY IMPLANT, URETHRAL STRICTURE DIALIATION;  Surgeon: Carolan Clines, MD;  Location: [facility];  Service: Urology;  Laterality: N/A;  . SHOULDER OPEN ROTATOR CUFF REPAIR Left 09-04-2014  . skin cancer L ear     Dr [name]   . TOTAL KNEE ARTHROPLASTY Bilateral right 08-13-2008  &  left 11-14-2008  . UMBILICAL HERNIA REPAIR  06-23-2012  . ureter stretch  05/02/2019   [facility] Dr [name]    There were no vitals filed for this visit.   Subjective Assessment - 12/09/20 1254    Subjective Pt reports overall his lower back is feeling ~90% better.  He had an episode of food  poisoning last week and this has resolved.    Pertinent History Jeremiah Zhang is a 60yoM who reports several months of 'serious' back issues, no help after attempted chiropractic treatment. Pt denies any particular contributing event.  Pt has been as high at 8-9/10, but since starting new medication 1 month prior, reports pain has been improved to 5-6/10 instance, but does not have pain all the time. Pt denies any pain migration, denies any pain or paresthesias of the legs. Pt has had xrays done showing scoliosis, otherwise no concerning findings. Pt has been taking tylenol daily for a month now. Pt has aggravation of pain with lifting items 35lbs or more, also with getting in/out of bed. Remote hx bilat TKA. 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 His wife is a retired Therapist, sports who worked at [employer].    Currently in Pain? No/denies                 Treatment Today:   Therapeutic exercise: to centralize symptoms and improve ROM, strength, muscular endurance, and activity tolerance required for successful completion of functional activities.   - NuStep level 5 using bilateral upper and lower extremities. Seat/handle setting 10. For improved extremity mobility, muscular endurance, and activity tolerance; and to induce the analgesic effect of aerobic exercise, stimulate improved joint nutrition, and prepare body structures and systems for following interventions. x 5 minutes. Average SPM = 73 - seated lumbar flexion theraball roll out x 20 to the front, x 20 diagonal alternating sides.   - sit <> stand from 17 inch chair, with green theraband around knees for isometric hip abduction, holding 15# DB, 3x10. Cuing to keep from pushing off legs with UEs.  - side stepping with green theraband around ankles 3x20 feet each direction with CGA for safety.     Manual therapy: to reduce pain and tissue tension, improve range of motion, neuromodulation, in order to promote  improved ability to complete functional activities.  Prone:  - STM to R  lumbar paraspinals and QL region, upper glutes.  - UPA to R lower thoracic segements, grade II-IV  - UPA to R lumbar segments, grade II-IV          PT Education - 12/09/20 1255    Education Details exercise technique/form    Person(s) Educated Patient    Methods Explanation;Demonstration;Tactile cues    Comprehension Verbalized understanding;Returned demonstration            PT Short Term Goals - 11/05/20 1711      PT SHORT TERM GOAL #1   Title After 4 weeks pt to demonstrate improved Rt SLS >10 sec.    Baseline <3sec    Time 4    Period Weeks    Status New    Target Date  12/03/20             PT Long Term Goals - 11/05/20 1712      PT LONG TERM GOAL #1   Title After 8 weeks pt to demonstrate seated Rt hip ER strength 5/5.    Baseline 4/5    Time 8    Period Weeks    Status New    Target Date 12/31/20      PT LONG TERM GOAL #2   Title After 8 weeks pt to demonstrate improved FOTO score to >70    Baseline 40/100 at eval    Time 8    Period Weeks    Status New    Target Date 12/31/20      PT LONG TERM GOAL #3   Title After 8 weeks pt to demonstrate 5xSTS <11 seconds to demonstrate improve function of hip/back extension.    Time 8    Period Weeks    Status New    Target Date 12/31/20                 Plan - 12/09/20 1256    Clinical Impression Statement Pt was most challenged with lateral band walking today.  He does require cues to stay on task during session.  He tolerated manual therapy for lumbar spine well to address soft tissue and joint hypomobility.  Overall, he should cont to benefit from skilled PT to progress toward PLOF or maximal functional independence.    Personal Factors and Comorbidities Age;Behavior Pattern;Fitness;Past/Current Experience    Examination-Activity Limitations Bathing;Locomotion Level;Bend;Carry;Sleep;Dressing;Stairs;Stand     Examination-Participation Restrictions Community Activity;Interpersonal Relationship;Yard Work    Stability/Clinical Decision Making Stable/Uncomplicated    Rehab Potential Good    PT Frequency 2x / week    PT Duration 8 weeks    PT Treatment/Interventions ADLs/Self Care Home Management;Gait training;Stair training;Joint Manipulations;Dry needling;Passive range of motion;Patient/family education;Therapeutic exercise;Balance training;Neuromuscular re-education;DME Instruction;Moist Heat;Electrical Stimulation    PT Next Visit Plan low back and hip strengthening exercises, manual therapy as needed    PT Home Exercise Plan Medbridge Access Code: [email or web address]    Consulted and Agree with Plan of Care Patient           Patient will benefit from skilled therapeutic intervention in order to improve the following deficits and impairments:  Abnormal gait,Decreased range of motion,Difficulty walking,Increased muscle spasms,Decreased activity tolerance,Decreased knowledge of precautions,Decreased balance,Decreased mobility,Decreased strength,Improper body mechanics  Visit Diagnosis: Chronic right-sided low back pain without sciatica  Stiffness of right hip, not elsewhere classified     Problem List Patient Active Problem List   Diagnosis Date Noted  . Personal history of other malignant neoplasm of skin 01/02/2020  . GERD (gastroesophageal reflux disease) 08/28/2019  . Hyperglycemia 08/28/2019  . Hyperlipidemia 08/28/2019  . Hypertension 08/28/2019  . Obesity 08/28/2019  . Myalgia due to statin 12/28/2018  . Hemorrhoids, internal, with bleeding 11/16/2018  . Erectile dysfunction following radiation therapy 08/10/2018  . Hypogonadism in male 05/11/2018  . Diabetes mellitus without complication ([place]) 16/96/7893  . Prostate cancer ([place]) 07/14/2016  . Status post rotator cuff repair 01/05/2015  . Encounter for long-term (current) use of other medications 01/15/2014  . Benign prostatic  hyperplasia without lower urinary tract symptoms 06/27/2012    Pincus Badder 12/09/2020, 1:07 PM Merdis Delay, PT, DPT  [facility] [address], [place], [address] Phone: [phone]   Fax:  [phone]  Name: RAHIL PASSEY MRN: [id number]  Date of Birth: [birth year]

## 2020-12-10 ENCOUNTER — Ambulatory Visit (INDEPENDENT_AMBULATORY_CARE_PROVIDER_SITE_OTHER): Payer: Medicare Other | Admitting: Dermatology

## 2020-12-10 DIAGNOSIS — L578 Other skin changes due to chronic exposure to nonionizing radiation: Secondary | ICD-10-CM | POA: Diagnosis not present

## 2020-12-10 DIAGNOSIS — L570 Actinic keratosis: Secondary | ICD-10-CM

## 2020-12-10 DIAGNOSIS — Z85828 Personal history of other malignant neoplasm of skin: Secondary | ICD-10-CM

## 2020-12-10 DIAGNOSIS — L821 Other seborrheic keratosis: Secondary | ICD-10-CM

## 2020-12-10 NOTE — Patient Instructions (Signed)
Cryotherapy Aftercare  . Wash gently with soap and water everyday.   . Apply Vaseline and Band-Aid daily until healed.  

## 2020-12-10 NOTE — Progress Notes (Signed)
   Follow-Up Visit   Subjective  Jeremiah Zhang is a 75 y.o. male who presents for the following: Follow-up (2 months f/u Aks on face, scalp, treated scalp with PDT 1 month ago, treated face with LN2 2 months ago ).  The following portions of the chart were reviewed this encounter and updated as appropriate:   Tobacco  Allergies  Meds  Problems  Med Hx  Surg Hx  Fam Hx      Review of Systems:  No other skin or systemic complaints except as noted in HPI or Assessment and Plan.  Objective  Well appearing patient in no apparent distress; mood and affect are within normal limits.  A focused examination was performed including face, scalp . Relevant physical exam findings are noted in the Assessment and Plan.  Objective  multiple see history: Well healed scar with no evidence of recurrence.   Objective  face, scalp (12): Erythematous thin papules/macules with gritty scale.    Assessment & Plan  History of basal cell carcinoma (BCC) multiple see history  Clear. Observe for recurrence. Call clinic for new or changing lesions.  Recommend regular skin exams, daily broad-spectrum spf 30+ sunscreen use, and photoprotection.     AK (actinic keratosis) (12) face, scalp  Destruction of lesion - face, scalp Complexity: simple   Destruction method: cryotherapy   Informed consent: discussed and consent obtained   Timeout:  patient name, date of birth, surgical site, and procedure verified Lesion destroyed using liquid nitrogen: Yes   Region frozen until ice ball extended beyond lesion: Yes   Outcome: patient tolerated procedure well with no complications   Post-procedure details: wound care instructions given     Actinic Damage - chronic, secondary to cumulative UV radiation exposure/sun exposure over time - diffuse scaly erythematous macules with underlying dyspigmentation - Recommend daily broad spectrum sunscreen SPF 30+ to sun-exposed areas, reapply every 2 hours as needed.   - Call for new or changing lesions.  Seborrheic Keratoses - Stuck-on, waxy, tan-brown papules and plaques  - Discussed benign etiology and prognosis. - Observe - Call for any changes  Return in about 6 months (around 06/09/2021) for Aks .  [name], CMA, am acting as scribe for [name], MD .  Documentation: I have reviewed the above documentation for accuracy and completeness, and I agree with the above.  [name], MD

## 2020-12-11 ENCOUNTER — Encounter: Payer: Self-pay | Admitting: Physical Therapy

## 2020-12-11 ENCOUNTER — Ambulatory Visit: Payer: Medicare Other | Admitting: Physical Therapy

## 2020-12-11 ENCOUNTER — Other Ambulatory Visit: Payer: Self-pay

## 2020-12-11 VITALS — BP 93/66 | HR 79

## 2020-12-11 DIAGNOSIS — M25651 Stiffness of right hip, not elsewhere classified: Secondary | ICD-10-CM

## 2020-12-11 DIAGNOSIS — M545 Low back pain, unspecified: Secondary | ICD-10-CM | POA: Diagnosis not present

## 2020-12-11 DIAGNOSIS — G8929 Other chronic pain: Secondary | ICD-10-CM

## 2020-12-11 NOTE — Therapy (Signed)
[place] [facility] [address] [place], [address] Phone: [phone]   Fax:  [phone]  Physical Therapy Treatment / Progress Note / Re- certification Reporting period: 11/05/2020 - 12/11/2020  Patient Details  Name: Jeremiah Zhang MRN: [id number] Date of Birth: 1946-05-29 Referring Provider (PT): [name]   Encounter Date: 12/11/2020   PT End of Session - 12/11/20 1128    Visit Number 10    Number of Visits 34    Date for PT Re-Evaluation 03/05/21    Authorization Type Medicare reporting period from 11/05/2020    Progress Note Due on Visit 10    PT Start Time 1031    PT Stop Time 1113    PT Time Calculation (min) 42 min    Activity Tolerance Patient limited by fatigue    Behavior During Therapy [facility] for tasks assessed/performed           Past Medical History:  Diagnosis Date  . Actinic keratosis   . Anticoagulant long-term use    ELIQUIS  . Arthritis   . Basal cell carcinoma 01/27/2010   Right spinal upper back. Superficial.   . Basal cell carcinoma 11/15/2019   Left inferior antihelix post. to tragus. Superficial and nodular patterns. Excised: 11/27/2019  . Basal cell carcinoma 11/15/2019   Left mid lateral nose. Nodular and infiltrative patterns. Excised: 11/27/2019  . Basal cell carcinoma 11/27/2019   Left mid superior helix. BCC with focal sclerosis.  . Basal cell carcinoma 01/29/2020   Left mid superior ear helix  . 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 Slight atypia. Edges free.  . ED (erectile dysfunction)   . GERD (gastroesophageal reflux disease)   . Hiatal hernia   . History of DVT of lower extremity    POST OP KNEE SURGERY  . History of transient ischemic attack (TIA)    PER PT HX  ?POSSIBLE-- PT ON ELIQUIS  . Hyperlipidemia   . Hypertension   . Hypogonadism male   . OSA (obstructive sleep apnea)    per pt positive sleep study yrs ago recommended CPAP --  pt Non-  compliant -- states uses Breath Rite Nasal Strips  . Peripheral neuropathy   . Prostate cancer [facility]) urologist-  dr tannenbaum/  oncologist-- dr [name]   dx 03-25-2015  T1c, Gleason 4+3  s/p  cryoablation prostate 06-02-2015/  external  radiation therapy 08-16-2016 to 09-20-2016/  scheduled for radioactive seed implants 10-22-2016  . TIA (transient ischemic attack)    ?? 08/20/2014? Eliquis 07/02/15  . Type 2 diabetes mellitus ([place])    followed by dr [name] [facility] clinic)  last A1c 8.4 on 07-27-2016 per lov note  . Umbilical hernia   . Wears glasses   . Wears hearing aid    bilateral    Past Surgical History:  Procedure Laterality Date  . COLONOSCOPY WITH PROPOFOL N/A 05/30/2017   Procedure: COLONOSCOPY WITH PROPOFOL;  Surgeon: [name], MD;  Location: [facility] ENDOSCOPY;  Service: Endoscopy;  Laterality: N/A;  . CRYOABLATION N/A 06/02/2015   Procedure: CRYO ABLATION PROSTATE;  Surgeon: Carolan Clines, MD;  Location: [facility];  Service: Urology;  Laterality: N/A;  . CYSTOSCOPY WITH URETHRAL DILATATION N/A 05/04/2019   Procedure: CYSTOSCOPY WITH URETHRAL DILATATION;  Surgeon: [name], MD;  Location[facility];  Service: Urology;  Laterality: N/A;  . ELBOW BURSA SURGERY Left Feb 1997  . prostate biopsies     x12, cryotherapy  .  RADIOACTIVE SEED IMPLANT N/A 10/22/2016   Procedure: RADIOACTIVE SEED IMPLANT/BRACHYTHERAPY IMPLANT, URETHRAL STRICTURE DIALIATION;  Surgeon: Carolan Clines, MD;  Location: [place];  Service: Urology;  Laterality: N/A;  . SHOULDER OPEN ROTATOR CUFF REPAIR Left 09-04-2014  . skin cancer L ear     Dr [name]   . TOTAL KNEE ARTHROPLASTY Bilateral right 08-13-2008  &  left 11-14-2008  . UMBILICAL HERNIA REPAIR  06-23-2012  . ureter stretch  05/02/2019   [facility] Dr [name]    Vitals:   12/11/20 1035 12/11/20 1100  BP: 100/70 93/66  Pulse: 84 79  SpO2: 95%      Subjective  Assessment - 12/11/20 1035    Subjective Patient reports he has been feeling better, ~90% better in the back, but for some reason is very tired this morning. He felt the manual therapy last session helped. specifically rolling in and out of bed is virtually no pain now. He has not been very diciplined with his HEP. has been doing "a little." He finds standing at the counter with diagonal kick backs to be the most accessable of the HEP. Rates pain today as 1/10 at right low back. Feels a little dizzy, has loose stool (but not diarrhea). Got 9 hours of sleep that was interrupted for 2 hours between 3-5am due to [place] invading [place]. States prior to food poisoning last week his BP ran in 130s/80s. Blood glucose was 130 mg/dl before eating this morning, 2 hours after eating has not been checked yet. States if he drops below 130 mg/dl he often feels bad.    Pertinent History Jeremiah Zhang is a 31yoM who reports several months of 'serious' back issues, no help after attempted chiropractic treatment. Pt denies any particular contributing event.  Pt has been as high at 8-9/10, but since starting new medication 1 month prior, reports pain has been improved to 5-6/10 instance, but does not have pain all the time. Pt denies any pain migration, denies any pain or paresthesias of the legs. Pt has had xrays done showing scoliosis, otherwise no concerning findings. Pt has been taking tylenol daily for a month now. Pt has aggravation of pain with lifting items 35lbs or more, also with getting in/out of bed. Remote hx bilat TKA. 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 His wife is a retired Therapist, sports who worked at [employer].    Currently in Pain? Yes    Pain Score 1     Pain Location Back    Pain Orientation Right;Lower    Pain Descriptors / Indicators Dull    Effect of Pain on Daily Activities Reported functional limitations patients finds most troubling: Walking, Getting up from sitting, Getting  out of bed (now reports 85-90% better), specifically rolling in and out of bed is virtually no pain now.          OBJECTIVE   FOTO = 55  MMT:  Hip ER in seated: R = 5/5, L = 5/5  FUNCTIONAL TESTING:  5 Time Sit to Stand Test: 12 seconds from 17 inch chair with no UE support or pain.   Objective measurements completed on examination: See above findings.    TREATMENT:   Therapeutic exercise:to centralize symptoms and improve ROM, strength, muscular endurance, and activity tolerance required for successful completion of functional activities.  - NuStep level5using bilateral upper and lower extremities. Seat/handle setting 10. For improved extremity mobility, muscular endurance, and activity tolerance; and to induce the  analgesic effect of aerobic exercise, stimulate improved joint nutrition, and prepare body structures and systems for following interventions. x5 minutes. Average SPM =69. - vitals check due to not feeling well (see above). Blood pressure low. Provided 8 oz of water that patient drank and felt better. Educated patient in importance of hydration and symptom monitoring. Denies any syncope or pre-syncope, chest pain, upper abdominal indigestion.  - seated lumbar flexion theraball roll out x 20 to the front, x 20 diagonal alternating sides. - sit <>stand from 17inch chair for time, 2x5.Cuing to keep from pushing off legs with UEs. - MMT to assess progress (see above).   Pt required multimodal cuing for proper technique and to facilitate improved neuromuscular control, strength, range of motion, and functional ability resulting in improved performance and form.     PT Education - 12/11/20 1127    Education Details exercise technique/form, POC. Educated patient on BP,importance of hydration and symptom monitoring and appropriate action.    Methods Explanation;Demonstration;Tactile cues;Verbal cues    Comprehension Verbalized understanding;Returned  demonstration;Verbal cues required;Tactile cues required;Need further instruction            PT Short Term Goals - 12/11/20 1130      PT SHORT TERM GOAL #1   Title After 4 weeks pt to demonstrate improved Rt SLS >10 sec.    Baseline <3sec; deferred measurement to next session    Time 4    Period Weeks    Status Deferred    Target Date 12/03/20             PT Long Term Goals - 12/11/20 1130      PT LONG TERM GOAL #1   Title After 8 weeks pt to demonstrate seated Rt hip ER strength 5/5.    Baseline 4/5    Time 8    Period Weeks    Status Achieved    Target Date 12/31/20      PT LONG TERM GOAL #2   Title After 12 weeks pt to demonstrate improved FOTO score to >70    Baseline 40/100 at eval (11/02/2020); 55/100 at visit 10 (12/11/2020)    Time 12    Period Weeks    Status Partially Met    Target Date 03/05/21      PT LONG TERM GOAL #3   Title After 8 weeks pt to demonstrate 5xSTS <11 seconds to demonstrate improve function of hip/back extension.    Baseline 12 seconds from 17 inch chair with no UE support (12/11/2020);    Time 8    Period Weeks    Status Partially Met             Plan - 12/11/20 1137    Clinical Impression Statement Patient has attended 10 physical therapy sessions and has made good progress towards several goals and reports he feels about 90% better. He continues to have some pain that does continue to limit his functional mobility and decreased quality of life and has not yet achieved FOTO goal that reflects improved self-reported function. Patient was not feeling well and did have symptoms and measurement of low blood pressure that was not causing syncope or pre-syncope. Provided patient with water and discussed importance of hydration especially since he has had some loose stool and history of recent food poisoning. Patient reported he was feeling better by end of session. He was encouraged to continue close monitoring of symptoms and vitals at home,  which he is used to doing. Patient would  benefit from continued management of limiting condition by skilled physical therapist to address remaining impairments and functional limitations to work towards stated goals and return to PLOF or maximal functional independence.    Personal Factors and Comorbidities Age;Behavior Pattern;Fitness;Past/Current Experience    Examination-Activity Limitations Bathing;Locomotion Level;Bend;Carry;Sleep;Dressing;Stairs;Stand    Examination-Participation Restrictions Community Activity;Interpersonal Relationship;Yard Work    Stability/Clinical Decision Making Stable/Uncomplicated    Rehab Potential Good    PT Frequency 2x / week    PT Duration 12 weeks    PT Treatment/Interventions ADLs/Self Care Home Management;Gait training;Stair training;Joint Manipulations;Dry needling;Passive range of motion;Patient/family education;Therapeutic exercise;Balance training;Neuromuscular re-education;DME Instruction;Moist Heat;Electrical Stimulation    PT Next Visit Plan low back and hip strengthening exercises, manual therapy as needed    PT Home Exercise Plan Medbridge Access Code: [email or web address]    Consulted and Agree with Plan of Care Patient           Patient will benefit from skilled therapeutic intervention in order to improve the following deficits and impairments:  Abnormal gait,Decreased range of motion,Difficulty walking,Increased muscle spasms,Decreased activity tolerance,Decreased knowledge of precautions,Decreased balance,Decreased mobility,Decreased strength,Improper body mechanics  Visit Diagnosis: Chronic right-sided low back pain without sciatica - Plan: PT plan of care cert/re-cert  Stiffness of right hip, not elsewhere classified - Plan: PT plan of care cert/re-cert      Problem List Patient Active Problem List   Diagnosis Date Noted  . Personal history of other malignant neoplasm of skin 01/02/2020  . GERD (gastroesophageal reflux disease) 08/28/2019  .  Hyperglycemia 08/28/2019  . Hyperlipidemia 08/28/2019  . Hypertension 08/28/2019  . Obesity 08/28/2019  . Myalgia due to statin 12/28/2018  . Erectile dysfunction following radiation therapy 08/10/2018  . Hypogonadism in male 05/11/2018  . Diabetes mellitus without complication ([place]) 88/91/6945  . Prostate cancer ([place]) 07/14/2016  . Status post rotator cuff repair 01/05/2015  . Encounter for long-term (current) use of other medications 01/15/2014  . Benign prostatic hyperplasia without lower urinary tract symptoms 06/27/2012    Everlean Alstrom. Graylon Good, PT, DPT 12/11/20, 11:41 AM  [place] [facility] [address] [place], [address] Phone: [phone]   Fax:  [phone]  Name: Jeremiah Zhang MRN: [id number] Date of Birth: 1946/03/18

## 2020-12-13 ENCOUNTER — Encounter: Payer: Self-pay | Admitting: Dermatology

## 2020-12-15 ENCOUNTER — Ambulatory Visit: Payer: Medicare Other | Admitting: Physical Therapy

## 2020-12-15 ENCOUNTER — Encounter: Payer: Self-pay | Admitting: Physical Therapy

## 2020-12-15 ENCOUNTER — Other Ambulatory Visit: Payer: Self-pay

## 2020-12-15 DIAGNOSIS — G8929 Other chronic pain: Secondary | ICD-10-CM

## 2020-12-15 DIAGNOSIS — M545 Low back pain, unspecified: Secondary | ICD-10-CM

## 2020-12-15 DIAGNOSIS — M25651 Stiffness of right hip, not elsewhere classified: Secondary | ICD-10-CM

## 2020-12-15 NOTE — Therapy (Signed)
[place] [facility] [address], [place], [address] Phone: [phone]   Fax:  [phone]  Physical Therapy Treatment  Patient Details  Name: Jeremiah Zhang MRN: [id number] Date of Birth: June 29, 1946 Referring Provider (PT): [name]   Encounter Date: 12/15/2020   PT End of Session - 12/15/20 1651    Visit Number 11    Number of Visits 34    Date for PT Re-Evaluation 03/05/21    Authorization Type Medicare reporting period from 12/09/2020    Progress Note Due on Visit 10    PT Start Time 1646    PT Stop Time 1726    PT Time Calculation (min) 40 min    Activity Tolerance Patient tolerated treatment well    Behavior During Therapy [facility] for tasks assessed/performed           Past Medical History:  Diagnosis Date  . Actinic keratosis   . Anticoagulant long-term use    ELIQUIS  . Arthritis   . Basal cell carcinoma 01/27/2010   Right spinal upper back. Superficial.   . Basal cell carcinoma 11/15/2019   Left inferior antihelix post. to tragus. Superficial and nodular patterns. Excised: 11/27/2019  . Basal cell carcinoma 11/15/2019   Left mid lateral nose. Nodular and infiltrative patterns. Excised: 11/27/2019  . Basal cell carcinoma 11/27/2019   Left mid superior helix. BCC with focal sclerosis.  . Basal cell carcinoma 01/29/2020   Left mid superior ear helix  . 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 Slight atypia. Edges free.  . ED (erectile dysfunction)   . GERD (gastroesophageal reflux disease)   . Hiatal hernia   . History of DVT of lower extremity    POST OP KNEE SURGERY  . History of transient ischemic attack (TIA)    PER PT HX  ?POSSIBLE-- PT ON ELIQUIS  . Hyperlipidemia   . Hypertension   . Hypogonadism male   . OSA (obstructive sleep apnea)    per pt positive sleep study yrs ago recommended CPAP --  pt Non- compliant -- states uses Breath Rite Nasal Strips  . Peripheral neuropathy    . Prostate cancer [facility]) urologist-  dr tannenbaum/  oncologist-- dr [name]   dx 03-25-2015  T1c, Gleason 4+3  s/p  cryoablation prostate 06-02-2015/  external  radiation therapy 08-16-2016 to 09-20-2016/  scheduled for radioactive seed implants 10-22-2016  . TIA (transient ischemic attack)    ?? 08/20/2014? Eliquis 07/02/15  . Type 2 diabetes mellitus ([place])    followed by dr [name] [facility] clinic)  last A1c 8.4 on 07-27-2016 per lov note  . Umbilical hernia   . Wears glasses   . Wears hearing aid    bilateral    Past Surgical History:  Procedure Laterality Date  . COLONOSCOPY WITH PROPOFOL N/A 05/30/2017   Procedure: COLONOSCOPY WITH PROPOFOL;  Surgeon: [name], MD;  Location: [facility]COPY;  Service: Endoscopy;  Laterality: N/A;  . CRYOABLATION N/A 06/02/2015   Procedure: CRYO ABLATION PROSTATE;  Surgeon: Carolan Clines, MD;  Location: [facility];  Service: Urology;  Laterality: N/A;  . CYSTOSCOPY WITH URETHRAL DILATATION N/A 05/04/2019   Procedure: CYSTOSCOPY WITH URETHRAL DILATATION;  Surgeon: [name], MD;  [facility];  Service: Urology;  Laterality: N/A;  . ELBOW BURSA SURGERY Left Feb 1997  . prostate biopsies     x12, cryotherapy  . RADIOACTIVE SEED IMPLANT N/A 10/22/2016   Procedure: RADIOACTIVE  SEED IMPLANT/BRACHYTHERAPY IMPLANT, URETHRAL STRICTURE DIALIATION;  Surgeon: Carolan Clines, MD;  Location: [facility];  Service: Urology;  Laterality: N/A;  . SHOULDER OPEN ROTATOR CUFF REPAIR Left 09-04-2014  . skin cancer L ear     Dr [name]   . TOTAL KNEE ARTHROPLASTY Bilateral right 08-13-2008  &  left 11-14-2008  . UMBILICAL HERNIA REPAIR  06-23-2012  . ureter stretch  05/02/2019   [facility] Dr [name]    There were no vitals filed for this visit.   Subjective Assessment - 12/15/20 1649    Subjective Patient reports he has had the best day he has had in a long time. Is feeling  better generally. Back pain is between 1-3/10 but mostly between 0-1/10. Is cucrrently 1/10 in the familiar region in the right low back. Feels mood is even better. Reports he has been slightly dizzy today and lately. States his wrist BP cuff has continued to read near 130/75 mg/dl at home. Has not been drinking as much water as his wife thinks he should.    Pertinent History Jeremiah Zhang is a 63yoM who reports several months of 'serious' back issues, no help after attempted chiropractic treatment. Pt denies any particular contributing event.  Pt has been as high at 8-9/10, but since starting new medication 1 month prior, reports pain has been improved to 5-6/10 instance, but does not have pain all the time. Pt denies any pain migration, denies any pain or paresthesias of the legs. Pt has had xrays done showing scoliosis, otherwise no concerning findings. Pt has been taking tylenol daily for a month now. Pt has aggravation of pain with lifting items 35lbs or more, also with getting in/out of bed. Remote hx bilat TKA. 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 His wife is a retired Therapist, sports who worked at [employer].    Currently in Pain? Yes    Pain Score 1            TREATMENT:   Therapeutic exercise:to centralize symptoms and improve ROM, strength, muscular endurance, and activity tolerance required for successful completion of functional activities. - NuStep level5using bilateral upper and lower extremities. Seat/handle setting 10. For improved extremity mobility, muscular endurance, and activity tolerance; and to induce the analgesic effect of aerobic exercise, stimulate improved joint nutrition, and prepare body structures and systems for following interventions. x5 minutes. Average SPM =64. - seated lumbar flexion theraball roll out x 20 to the front, x 20 diagonal alternating sides. mbar flexion theraball roll out x 20 to the front, x 20 diagonal alternating  sides. - sit <>stand from 17inch chair, withgreentheraband around knees for isometric hip abduction, holding 15#DB/20/#KB, 3x10.Cuing to keep from pushing off legs with UEs. - standing pallof press with green theraband loop, 1x20, 2x10 each side.  - sidelying hip abduction with slight extension, 1x10, 2x20 each side with cuing to prevent pelvis movement to isolate hip. - prone hip extension (multifidus kick) with pillow under pelvis, 2x20each side  Pt required multimodal cuing for proper technique and to facilitate improved neuromuscular control, strength, range of motion, and functional ability resulting in improved performance and form.   PT Education - 12/15/20 1650    Education Details exercise technique/form    Person(s) Educated Patient    Methods Explanation;Demonstration;Tactile cues;Verbal cues    Comprehension Verbalized understanding;Returned demonstration;Verbal cues required;Tactile cues required;Need further instruction            PT Short Term Goals -  12/11/20 1130      PT SHORT TERM GOAL #1   Title After 4 weeks pt to demonstrate improved Rt SLS >10 sec.    Baseline <3sec; deferred measurement to next session    Time 4    Period Weeks    Status Deferred    Target Date 12/03/20             PT Long Term Goals - 12/11/20 1130      PT LONG TERM GOAL #1   Title After 8 weeks pt to demonstrate seated Rt hip ER strength 5/5.    Baseline 4/5    Time 8    Period Weeks    Status Achieved    Target Date 12/31/20      PT LONG TERM GOAL #2   Title After 12 weeks pt to demonstrate improved FOTO score to >70    Baseline 40/100 at eval (11/02/2020); 55/100 at visit 10 (12/11/2020)    Time 12    Period Weeks    Status Partially Met    Target Date 03/05/21      PT LONG TERM GOAL #3   Title After 8 weeks pt to demonstrate 5xSTS <11 seconds to demonstrate improve function of hip/back extension.    Baseline 12 seconds from 17 inch chair with no UE support  (12/11/2020);    Time 8    Period Weeks    Status Partially Met                 Plan - 12/15/20 1801    Clinical Impression Statement Patient tolerated treatment well with no complaint of dizziness or lightheadedness except briefly at end of session when standing from prone. Able to progress sit <> stand to increased weight without increased pain but notable effort. Notes concordant pain with deepest breath at end of session, improved with more reps. Otherwise reports feeling well at end of session with no increase in pain. Broke a sweat this session with sit <> stands being especially challenging due to effort. Patient would benefit from continued management of limiting condition by skilled physical therapist to address remaining impairments and functional limitations to work towards stated goals and return to PLOF or maximal functional independence.    Personal Factors and Comorbidities Age;Behavior Pattern;Fitness;Past/Current Experience    Examination-Activity Limitations Bathing;Locomotion Level;Bend;Carry;Sleep;Dressing;Stairs;Stand    Examination-Participation Restrictions Community Activity;Interpersonal Relationship;Yard Work    Stability/Clinical Decision Making Stable/Uncomplicated    Rehab Potential Good    PT Frequency 2x / week    PT Duration 12 weeks    PT Treatment/Interventions ADLs/Self Care Home Management;Gait training;Stair training;Joint Manipulations;Dry needling;Passive range of motion;Patient/family education;Therapeutic exercise;Balance training;Neuromuscular re-education;DME Instruction;Moist Heat;Electrical Stimulation    PT Next Visit Plan low back and hip strengthening exercises, manual therapy as needed    PT Home Exercise Plan Medbridge Access Code: [email or web address]    Consulted and Agree with Plan of Care Patient           Patient will benefit from skilled therapeutic intervention in order to improve the following deficits and impairments:  Abnormal  gait,Decreased range of motion,Difficulty walking,Increased muscle spasms,Decreased activity tolerance,Decreased knowledge of precautions,Decreased balance,Decreased mobility,Decreased strength,Improper body mechanics  Visit Diagnosis: Chronic right-sided low back pain without sciatica  Stiffness of right hip, not elsewhere classified     Problem List Patient Active Problem List   Diagnosis Date Noted  . Personal history of other malignant neoplasm of skin 01/02/2020  . GERD (gastroesophageal reflux disease) 08/28/2019  . Hyperglycemia  08/28/2019  . Hyperlipidemia 08/28/2019  . Hypertension 08/28/2019  . Obesity 08/28/2019  . Myalgia due to statin 12/28/2018  . Erectile dysfunction following radiation therapy 08/10/2018  . Hypogonadism in male 05/11/2018  . Diabetes mellitus without complication ([place]) 20/72/1828  . Prostate cancer ([place]) 07/14/2016  . Status post rotator cuff repair 01/05/2015  . Encounter for long-term (current) use of other medications 01/15/2014  . Benign prostatic hyperplasia without lower urinary tract symptoms 06/27/2012    Everlean Alstrom. Graylon Good, PT, DPT 12/15/20, 6:02 PM  [place] [facility] [address] [place], [address] Phone: [phone]   Fax:  [phone]  Name: Jeremiah Zhang MRN: [id number] Date of Birth: [birth year]

## 2020-12-16 ENCOUNTER — Telehealth: Payer: Self-pay | Admitting: *Deleted

## 2020-12-16 NOTE — Telephone Encounter (Signed)
Testosterone approved 12/15/2020-12/16/2023

## 2020-12-17 ENCOUNTER — Ambulatory Visit: Payer: PPO | Attending: Family Medicine | Admitting: Physical Therapy

## 2020-12-17 ENCOUNTER — Encounter: Payer: Self-pay | Admitting: Physical Therapy

## 2020-12-17 ENCOUNTER — Other Ambulatory Visit: Payer: Self-pay

## 2020-12-17 DIAGNOSIS — G8929 Other chronic pain: Secondary | ICD-10-CM | POA: Diagnosis not present

## 2020-12-17 DIAGNOSIS — M545 Low back pain, unspecified: Secondary | ICD-10-CM | POA: Diagnosis not present

## 2020-12-17 DIAGNOSIS — M25651 Stiffness of right hip, not elsewhere classified: Secondary | ICD-10-CM | POA: Diagnosis not present

## 2020-12-17 NOTE — Therapy (Signed)
[facility] [address], [place], [address] Phone: [phone]   Fax:  [phone]  Physical Therapy Treatment  Patient Details  Name: Jeremiah Zhang MRN: [id number] Date of Birth: 05/05/1946 Referring Provider (PT): [name]   Encounter Date: 12/17/2020   PT End of Session - 12/17/20 1318    Visit Number 12    Number of Visits 34    Date for PT Re-Evaluation 03/05/21    Authorization Type Medicare reporting period from 12/09/2020    Progress Note Due on Visit 10    PT Start Time 1300    PT Stop Time 1345    PT Time Calculation (min) 45 min    Activity Tolerance Patient tolerated treatment well    Behavior During Therapy [facility] for tasks assessed/performed           Past Medical History:  Diagnosis Date  . Actinic keratosis   . Anticoagulant long-term use    ELIQUIS  . Arthritis   . Basal cell carcinoma 01/27/2010   Right spinal upper back. Superficial.   . Basal cell carcinoma 11/15/2019   Left inferior antihelix post. to tragus. Superficial and nodular patterns. Excised: 11/27/2019  . Basal cell carcinoma 11/15/2019   Left mid lateral nose. Nodular and infiltrative patterns. Excised: 11/27/2019  . Basal cell carcinoma 11/27/2019   Left mid superior helix. BCC with focal sclerosis.  . Basal cell carcinoma 01/29/2020   Left mid superior ear helix  . 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 Slight atypia. Edges free.  . ED (erectile dysfunction)   . GERD (gastroesophageal reflux disease)   . Hiatal hernia   . History of DVT of lower extremity    POST OP KNEE SURGERY  . History of transient ischemic attack (TIA)    PER PT HX  ?POSSIBLE-- PT ON ELIQUIS  . Hyperlipidemia   . Hypertension   . Hypogonadism male   . OSA (obstructive sleep apnea)    per pt positive sleep study yrs ago recommended CPAP --  pt Non- compliant -- states uses Breath Rite Nasal Strips  . Peripheral neuropathy   .  Prostate cancer [facility]) urologist-  dr tannenbaum/  oncologist-- dr [name]   dx 03-25-2015  T1c, Gleason 4+3  s/p  cryoablation prostate 06-02-2015/  external  radiation therapy 08-16-2016 to 09-20-2016/  scheduled for radioactive seed implants 10-22-2016  . TIA (transient ischemic attack)    ?? 08/20/2014? Eliquis 07/02/15  . Type 2 diabetes mellitus ([place])    followed by dr [name] [facility] clinic)  last A1c 8.4 on 07-27-2016 per lov note  . Umbilical hernia   . Wears glasses   . Wears hearing aid    bilateral    Past Surgical History:  Procedure Laterality Date  . COLONOSCOPY WITH PROPOFOL N/A 05/30/2017   Procedure: COLONOSCOPY WITH PROPOFOL;  Surgeon: [name], MD;  Location: [facility] END[facility]COPY;  Service: Endoscopy;  Laterality: N/A;  . CRYOABLATION N/A 06/02/2015   Procedure: CRYO ABLATION PROSTATE;  Surgeon: Carolan Clines, MD;  Location: [facility];  Service: Urology;  Laterality: N/A;  . CYSTOSCOPY WITH URETHRAL DILATATION N/A 05/04/2019   Procedure: CYSTOSCOPY WITH URETHRAL DILATATION;  Surgeon: [name], MD;  Location: [facility];  Service: Urology;  Laterality: N/A;  . ELBOW BURSA SURGERY Left Feb 1997  . prostate biopsies     x12, cryotherapy  . RADIOACTIVE SEED IMPLANT N/A 10/22/2016   Procedure: RADIOACTIVE  SEED IMPLANT/BRACHYTHERAPY IMPLANT, URETHRAL STRICTURE DIALIATION;  Surgeon: Carolan Clines, MD;  Location: [facility];  Service: Urology;  Laterality: N/A;  . SHOULDER OPEN ROTATOR CUFF REPAIR Left 09-04-2014  . skin cancer L ear     Dr [name]   . TOTAL KNEE ARTHROPLASTY Bilateral right 08-13-2008  &  left 11-14-2008  . UMBILICAL HERNIA REPAIR  06-23-2012  . ureter stretch  05/02/2019   [facility] Dr [name]    There were no vitals filed for this visit.   Subjective Assessment - 12/17/20 1303    Subjective Patient reports he is feeling okay today. States his pain is 1.5/10 but would rate  1/10 if he must choose whole numbers. States he feels 85-90% better than prior to PT. 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    Pertinent History [facility] is a 3yoM who reports several months of 'serious' back issues, no help after attempted chiropractic treatment. Pt denies any particular contributing event.  Pt has been as high at 8-9/10, but since starting new medication 1 month prior, reports pain has been improved to 5-6/10 instance, but does not have pain all the time. Pt denies any pain migration, denies any pain or paresthesias of the legs. Pt has had xrays done showing scoliosis, otherwise no concerning findings. Pt has been taking tylenol daily for a month now. Pt has aggravation of pain with lifting items 35lbs or more, also with getting in/out of bed. Remote hx bilat TKA. 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 His wife is a retired Therapist, sports who worked at [employer].    Currently in Pain? Yes    Pain Score 1            TREATMENT:  Therapeutic exercise:to centralize symptoms and improve ROM, strength, muscular endurance, and activity tolerance required for successful completion of functional activities. - NuStep level5using bilateral upper and lower extremities. Seat/handle setting 10. For improved extremity mobility, muscular endurance, and activity tolerance; and to induce the analgesic effect of aerobic exercise, stimulate improved joint nutrition, and prepare body structures and systems for following interventions. x5 minutes. Average SPM =69. - seated lumbar flexion theraball roll out x 20 to the front, x 20 diagonal alternating sides. - sit <>stand from 17inch chair, withgreentheraband around knees for isometric hip abduction, holding 15#DB/20/#KB, 3x10.Cuing to keep from pushing off legs with UEs. - Deadlift with 20#KB, 3x10 - KB swing  with 20#KB x ~ 10 working on improving form (started to irritate back).  - standing pallof press with 10# cable, 2x20 each side.  - sidelying hip abduction with slight extension, 2x20 each side with 2#AW with cuing to prevent pelvis movement to isolate hip. - prone hip extension (multifidus kick) with pillow under pelvis and 2#AW, 2x20each side  Pt required multimodal cuing for proper technique and to facilitate improved neuromuscular control, strength, range of motion, and functional ability resulting in improved performance and form.    PT Education - 12/17/20 1318    Education Details exercise technique/form    Person(s) Educated Patient    Methods Explanation;Demonstration;Tactile cues;Verbal cues    Comprehension Verbalized understanding;Returned demonstration;Verbal cues required;Tactile cues required;Need further instruction            PT Short Term Goals - 12/11/20 1130      PT SHORT TERM GOAL #1   Title  After 4 weeks pt to demonstrate improved Rt SLS >10 sec.    Baseline <3sec; deferred measurement to next session    Time 4    Period Weeks    Status Deferred    Target Date 12/03/20             PT Long Term Goals - 12/11/20 1130      PT LONG TERM GOAL #1   Title After 8 weeks pt to demonstrate seated Rt hip ER strength 5/5.    Baseline 4/5    Time 8    Period Weeks    Status Achieved    Target Date 12/31/20      PT LONG TERM GOAL #2   Title After 12 weeks pt to demonstrate improved FOTO score to >70    Baseline 40/100 at eval (11/02/2020); 55/100 at visit 10 (12/11/2020)    Time 12    Period Weeks    Status Partially Met    Target Date 03/05/21      PT LONG TERM GOAL #3   Title After 8 weeks pt to demonstrate 5xSTS <11 seconds to demonstrate improve function of hip/back extension.    Baseline 12 seconds from 17 inch chair with no UE support (12/11/2020);    Time 8    Period Weeks    Status Partially Met                 Plan - 12/17/20 1343     Clinical Impression Statement Patient tolerated treatment well overall with mild increase in pain with attempt at kettlebell swing. Continued to focus on trunk and LE strengthening. Found prone hip extension particularly challenging. Patient would benefit from continued management of limiting condition by skilled physical therapist to address remaining impairments and functional limitations to work towards stated goals and return to PLOF or maximal functional independence    Personal Factors and Comorbidities Age;Behavior Pattern;Fitness;Past/Current Experience    Examination-Activity Limitations Bathing;Locomotion Level;Bend;Carry;Sleep;Dressing;Stairs;Stand    Examination-Participation Restrictions Community Activity;Interpersonal Relationship;Yard Work    Stability/Clinical Decision Making Stable/Uncomplicated    Rehab Potential Good    PT Frequency 2x / week    PT Duration 12 weeks    PT Treatment/Interventions ADLs/Self Care Home Management;Gait training;Stair training;Joint Manipulations;Dry needling;Passive range of motion;Patient/family education;Therapeutic exercise;Balance training;Neuromuscular re-education;DME Instruction;Moist Heat;Electrical Stimulation    PT Next Visit Plan low back and hip strengthening exercises, manual therapy as needed    PT Home Exercise Plan Medbridge Access Code: [email or web address]    Consulted and Agree with Plan of Care Patient           Patient will benefit from skilled therapeutic intervention in order to improve the following deficits and impairments:  Abnormal gait,Decreased range of motion,Difficulty walking,Increased muscle spasms,Decreased activity tolerance,Decreased knowledge of precautions,Decreased balance,Decreased mobility,Decreased strength,Improper body mechanics  Visit Diagnosis: Chronic right-sided low back pain without sciatica  Stiffness of right hip, not elsewhere classified     Problem List Patient Active Problem List   Diagnosis  Date Noted  . Personal history of other malignant neoplasm of skin 01/02/2020  . GERD (gastroesophageal reflux disease) 08/28/2019  . Hyperglycemia 08/28/2019  . Hyperlipidemia 08/28/2019  . Hypertension 08/28/2019  . Obesity 08/28/2019  . Myalgia due to statin 12/28/2018  . Erectile dysfunction following radiation therapy 08/10/2018  . Hypogonadism in male 05/11/2018  . Diabetes mellitus without complication ([place]) 56/21/3086  . Prostate cancer ([place]) 07/14/2016  . Status post rotator cuff repair 01/05/2015  . Encounter for long-term (current) use of  other medications 01/15/2014  . Benign prostatic hyperplasia without lower urinary tract symptoms 06/27/2012    Everlean Alstrom. Graylon Good, PT, DPT 12/17/20, 1:47 PM  [facility] [address] [place], [address] Phone: [phone]   Fax:  [phone]  Name: KHARTER BREW MRN: [id number] Date of Birth: June 25, 1946

## 2020-12-22 ENCOUNTER — Other Ambulatory Visit: Payer: Self-pay

## 2020-12-22 ENCOUNTER — Ambulatory Visit: Payer: PPO | Admitting: Physical Therapy

## 2020-12-22 DIAGNOSIS — M545 Low back pain, unspecified: Secondary | ICD-10-CM

## 2020-12-22 DIAGNOSIS — G4733 Obstructive sleep apnea (adult) (pediatric): Secondary | ICD-10-CM | POA: Diagnosis not present

## 2020-12-22 DIAGNOSIS — Z9989 Dependence on other enabling machines and devices: Secondary | ICD-10-CM | POA: Diagnosis not present

## 2020-12-22 DIAGNOSIS — M25651 Stiffness of right hip, not elsewhere classified: Secondary | ICD-10-CM

## 2020-12-22 NOTE — Therapy (Signed)
[facility] [address], [place], [address] Phone: [phone]   Fax:  [phone]  Physical Therapy Treatment  Patient Details  Name: Jeremiah Zhang MRN: [id number] Date of Birth: June 16, 1946 Referring Provider (PT): [name]   Encounter Date: 12/22/2020   PT End of Session - 12/22/20 1046    Visit Number 13    Number of Visits 34    Date for PT Re-Evaluation 03/05/21    Authorization Type Medicare reporting period from 12/09/2020    Progress Note Due on Visit 10    PT Start Time 1038    PT Stop Time 1114    PT Time Calculation (min) 36 min    Activity Tolerance Patient tolerated treatment well    Behavior During Therapy [facility] for tasks assessed/performed           Past Medical History:  Diagnosis Date  . Actinic keratosis   . Anticoagulant long-term use    ELIQUIS  . Arthritis   . Basal cell carcinoma 01/27/2010   Right spinal upper back. Superficial.   . Basal cell carcinoma 11/15/2019   Left inferior antihelix post. to tragus. Superficial and nodular patterns. Excised: 11/27/2019  . Basal cell carcinoma 11/15/2019   Left mid lateral nose. Nodular and infiltrative patterns. Excised: 11/27/2019  . Basal cell carcinoma 11/27/2019   Left mid superior helix. BCC with focal sclerosis.  . Basal cell carcinoma 01/29/2020   Left mid superior ear helix  . 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 Slight atypia. Edges free.  . ED (erectile dysfunction)   . GERD (gastroesophageal reflux disease)   . Hiatal hernia   . History of DVT of lower extremity    POST OP KNEE SURGERY  . History of transient ischemic attack (TIA)    PER PT HX  ?POSSIBLE-- PT ON ELIQUIS  . Hyperlipidemia   . Hypertension   . Hypogonadism male   . OSA (obstructive sleep apnea)    per pt positive sleep study yrs ago recommended CPAP --  pt Non- compliant -- states uses Breath Rite Nasal Strips  . Peripheral neuropathy   .  Prostate cancer Jeremiah Zhang   dx 03-25-2015  T1c, Gleason 4+3  s/p  cryoablation prostate 06-02-2015/  external  radiation therapy 08-16-2016 to 09-20-2016/  scheduled for radioactive seed implants 10-22-2016  . TIA (transient ischemic attack)    ?? 08/20/2014? Eliquis 07/02/15  . Type 2 diabetes mellitus ([place])    followed by dr [name] [facility] clinic)  last A1c 8.4 on 07-27-2016 per lov note  . Umbilical hernia   . Wears glasses   . Wears hearing aid    bilateral    Past Surgical History:  Procedure Laterality Date  . COLONOSCOPY WITH PROPOFOL N/A 05/30/2017   Procedure: COLONOSCOPY WITH PROPOFOL;  Surgeon: [name], MD;  Location: [facility] ENDOSCOPY;  Service: Endoscopy;  Laterality: N/A;  . CRYOABLATION N/A 06/02/2015   Procedure: CRYO ABLATION PROSTATE;  Surgeon: Carolan Clines, MD;  Location: [facility];  Service: Urology;  Laterality: N/A;  . CYSTOSCOPY WITH URETHRAL DILATATION N/A 05/04/2019   Procedure: CYSTOSCOPY WITH URETHRAL DILATATION;  Surgeon: [name], MD;  Location: [facility];  Service: Urology;  Laterality: N/A;  . ELBOW BURSA SURGERY Left Feb 1997  . prostate biopsies     x12, cryotherapy  . RADIOACTIVE SEED IMPLANT N/A 10/22/2016   Procedure: RADIOACTIVE  SEED IMPLANT/BRACHYTHERAPY IMPLANT, URETHRAL STRICTURE DIALIATION;  Surgeon: Carolan Clines, MD;  Location: [facility];  Service: Urology;  Laterality: N/A;  . SHOULDER OPEN ROTATOR CUFF REPAIR Left 09-04-2014  . skin cancer L ear     Dr [name]   . TOTAL KNEE ARTHROPLASTY Bilateral right 08-13-2008  &  left 11-14-2008  . UMBILICAL HERNIA REPAIR  06-23-2012  . ureter stretch  05/02/2019   [facility] Dr [name]    There were no vitals filed for this visit.   Subjective Assessment - 12/22/20 1038    Subjective Patient reports his back is 1/10 and it is the lowest it has been for a while. He  states his back has been closer to 2/10 over the weekend and some difficulty with lightheadedness upon standing that he thinks may be related to low blood pressure. States his wife told him she has never seen him this inactive. He comes in and sits down after just a little bit of activity. He is also taking 2 hour naps a day. BP has been running lower than his normal at home. Reports his normal is 130/80 mmHg. Patient reports he has an appointment with his doctor later this afternoon. States he is wondering if what we are doing is really effective. States he felt the manual therapy and needling was the most helpful. He felt about the same after last session but then his pain was minimal yesterday and today. Does not feel that he was worse following last treatment session.    Pertinent History Jeremiah Zhang is a 63yoM who reports several months of 'serious' back issues, no help after attempted chiropractic treatment. Pt denies any particular contributing event.  Pt has been as high at 8-9/10, but since starting new medication 1 month prior, reports pain has been improved to 5-6/10 instance, but does not have pain all the time. Pt denies any pain migration, denies any pain or paresthesias of the legs. Pt has had xrays done showing scoliosis, otherwise no concerning findings. Pt has been taking tylenol daily for a month now. Pt has aggravation of pain with lifting items 35lbs or more, also with getting in/out of bed. Remote hx bilat TKA. 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 His wife is a retired Therapist, sports who worked at [employer].    Currently in Pain? Yes    Pain Score 1              TREATMENT:  Therapeutic exercise:to centralize symptoms and improve ROM, strength, muscular endurance, and activity tolerance required for successful completion of functional activities. - seated lumbar flexion theraball roll out x 15 to the front, x 15 diagonal alternating sides. - sit  <>stand from 17inch chair, withgreentheraband around knees for isometric hip abduction, holding 20#KB, 3x10.Cuing for glute squeeze.  - Deadlift with 20#KB, 3x10 - standing pallof press with 10# cable, 2x20 each side.  Modality: (unbilled) Dry needling performed to right lower back to decrease pain and spasms along patient's lumbar region with patient in prone utilizing (1) dry needle(s) .62m x 755mwith (2) sticks at right lumbar multifidi and Iliocostalis at approximately L4 level . Patient educated about the risks and benefits from therapy and verbally consents to treatment.  Dry needling performed by SaEverlean AlstromSnGraylon GoodT, DPT who is certified in this technique.  Pt required multimodal cuing for proper technique and to facilitate improved neuromuscular control, strength, range of motion, and functional ability resulting in improved performance  and form.     PT Education - 12/22/20 1255    Education Details exercise technique/form    Person(s) Educated Patient    Methods Explanation;Demonstration;Tactile cues;Verbal cues    Comprehension Verbalized understanding;Returned demonstration;Verbal cues required;Tactile cues required;Need further instruction            PT Short Term Goals - 12/11/20 1130      PT SHORT TERM GOAL #1   Title After 4 weeks pt to demonstrate improved Rt SLS >10 sec.    Baseline <3sec; deferred measurement to next session    Time 4    Period Weeks    Status Deferred    Target Date 12/03/20             PT Long Term Goals - 12/11/20 1130      PT LONG TERM GOAL #1   Title After 8 weeks pt to demonstrate seated Rt hip ER strength 5/5.    Baseline 4/5    Time 8    Period Weeks    Status Achieved    Target Date 12/31/20      PT LONG TERM GOAL #2   Title After 12 weeks pt to demonstrate improved FOTO score to >70    Baseline 40/100 at eval (11/02/2020); 55/100 at visit 10 (12/11/2020)    Time 12    Period Weeks    Status Partially Met    Target  Date 03/05/21      PT LONG TERM GOAL #3   Title After 8 weeks pt to demonstrate 5xSTS <11 seconds to demonstrate improve function of hip/back extension.    Baseline 12 seconds from 17 inch chair with no UE support (12/11/2020);    Time 8    Period Weeks    Status Partially Met                 Plan - 12/22/20 1252    Clinical Impression Statement Patient arrived late but called ahead to let PT know. Continued with strengthening and motor control exercises. Continues to need frequent re-direction to stay on task. Did not experience dizziness during session. Consider returning to some manual therapy next session as time allows.  Patient would benefit from continued management of limiting condition by skilled physical therapist to address remaining impairments and functional limitations to work towards stated goals and return to PLOF or maximal functional independence.    Personal Factors and Comorbidities Age;Behavior Pattern;Fitness;Past/Current Experience    Examination-Activity Limitations Bathing;Locomotion Level;Bend;Carry;Sleep;Dressing;Stairs;Stand    Examination-Participation Restrictions Community Activity;Interpersonal Relationship;Yard Work    Stability/Clinical Decision Making Stable/Uncomplicated    Rehab Potential Good    PT Frequency 2x / week    PT Duration 12 weeks    PT Treatment/Interventions ADLs/Self Care Home Management;Gait training;Stair training;Joint Manipulations;Dry needling;Passive range of motion;Patient/family education;Therapeutic exercise;Balance training;Neuromuscular re-education;DME Instruction;Moist Heat;Electrical Stimulation    PT Next Visit Plan low back and hip strengthening exercises, manual therapy as needed    PT Home Exercise Plan Medbridge Access Code: [email or web address]    Consulted and Agree with Plan of Care Patient           Patient will benefit from skilled therapeutic intervention in order to improve the following deficits and impairments:   Abnormal gait,Decreased range of motion,Difficulty walking,Increased muscle spasms,Decreased activity tolerance,Decreased knowledge of precautions,Decreased balance,Decreased mobility,Decreased strength,Improper body mechanics  Visit Diagnosis: Chronic right-sided low back pain without sciatica  Stiffness of right hip, not elsewhere classified     Problem List Patient Active Problem List  Diagnosis Date Noted  . Personal history of other malignant neoplasm of skin 01/02/2020  . GERD (gastroesophageal reflux disease) 08/28/2019  . Hyperglycemia 08/28/2019  . Hyperlipidemia 08/28/2019  . Hypertension 08/28/2019  . Obesity 08/28/2019  . Myalgia due to statin 12/28/2018  . Erectile dysfunction following radiation therapy 08/10/2018  . Hypogonadism in male 05/11/2018  . Diabetes mellitus without complication ([place]) 29/47/6546  . Prostate cancer ([place]) 07/14/2016  . Status post rotator cuff repair 01/05/2015  . Encounter for long-term (current) use of other medications 01/15/2014  . Benign prostatic hyperplasia without lower urinary tract symptoms 06/27/2012    Everlean Alstrom. Graylon Good, PT, DPT 12/22/20, 12:56 PM  [facility] [address], [place], [address] Phone: [phone]   Fax:  [phone]  Name: EDMUNDO TEDESCO MRN: [id number] Date of Birth: 04-23-1946

## 2020-12-24 ENCOUNTER — Ambulatory Visit: Payer: PPO | Admitting: Physical Therapy

## 2020-12-24 ENCOUNTER — Other Ambulatory Visit: Payer: Self-pay

## 2020-12-24 ENCOUNTER — Encounter: Payer: Self-pay | Admitting: Physical Therapy

## 2020-12-24 DIAGNOSIS — M545 Low back pain, unspecified: Secondary | ICD-10-CM | POA: Diagnosis not present

## 2020-12-24 DIAGNOSIS — G8929 Other chronic pain: Secondary | ICD-10-CM

## 2020-12-24 DIAGNOSIS — M25651 Stiffness of right hip, not elsewhere classified: Secondary | ICD-10-CM

## 2020-12-24 NOTE — Therapy (Signed)
[place] [facility] [address] [place], [address] Phone: [phone]   Fax:  [phone]  Physical Therapy Treatment  Patient Details  Name: Jeremiah Zhang MRN: [id number] Date of Birth: 10/09/1946 Referring Provider (PT): [name]   Encounter Date: 12/24/2020   PT End of Session - 12/24/20 1416    Visit Number 14    Number of Visits 34    Date for PT Re-Evaluation 03/05/21    Authorization Type Medicare reporting period from 12/09/2020    Progress Note Due on Visit 10    PT Start Time 1346    PT Stop Time 1428    PT Time Calculation (min) 42 min    Activity Tolerance Patient tolerated treatment well    Behavior During Therapy [facility] for tasks assessed/performed           Past Medical History:  Diagnosis Date  . Actinic keratosis   . Anticoagulant long-term use    ELIQUIS  . Arthritis   . Basal cell carcinoma 01/27/2010   Right spinal upper back. Superficial.   . Basal cell carcinoma 11/15/2019   Left inferior antihelix post. to tragus. Superficial and nodular patterns. Excised: 11/27/2019  . Basal cell carcinoma 11/15/2019   Left mid lateral nose. Nodular and infiltrative patterns. Excised: 11/27/2019  . Basal cell carcinoma 11/27/2019   Left mid superior helix. BCC with focal sclerosis.  . Basal cell carcinoma 01/29/2020   Left mid superior ear helix  . 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 Slight atypia. Edges free.  . ED (erectile dysfunction)   . GERD (gastroesophageal reflux disease)   . Hiatal hernia   . History of DVT of lower extremity    POST OP KNEE SURGERY  . History of transient ischemic attack (TIA)    PER PT HX  ?POSSIBLE-- PT ON ELIQUIS  . Hyperlipidemia   . Hypertension   . Hypogonadism male   . OSA (obstructive sleep apnea)    per pt positive sleep study yrs ago recommended CPAP --  pt Non- compliant -- states uses Breath Rite Nasal Strips  . Peripheral neuropathy   .  Prostate cancer [facility]) urologist-  dr tannenbaum/  oncologist-- dr [name]   dx 03-25-2015  T1c, Gleason 4+3  s/p  cryoablation prostate 06-02-2015/  external  radiation therapy 08-16-2016 to 09-20-2016/  scheduled for radioactive seed implants 10-22-2016  . TIA (transient ischemic attack)    ?? 08/20/2014? Eliquis 07/02/15  . Type 2 diabetes mellitus ([place])    followed by dr [name] [facility])  last A1c 8.4 on 07-27-2016 per lov note  . Umbilical hernia   . Wears glasses   . Wears hearing aid    bilateral    Past Surgical History:  Procedure Laterality Date  . COLONOSCOPY WITH PROPOFOL N/A 05/30/2017   Procedure: COLONOSCOPY WITH PROPOFOL;  Surgeon: [name], MD;  Location: [facility] ENDOSCOPY;  Service: Endoscopy;  Laterality: N/A;  . CRYOABLATION N/A 06/02/2015   Procedure: CRYO ABLATION PROSTATE;  Surgeon: Carolan Clines, MD;  Location: [facility];  Service: Urology;  Laterality: N/A;  . CYSTOSCOPY WITH URETHRAL DILATATION N/A 05/04/2019   Procedure: CYSTOSCOPY WITH URETHRAL DILATATION;  Surgeon: [name], MD;  Location: [facility];  Service: Urology;  Laterality: N/A;  . ELBOW BURSA SURGERY Left Feb 1997  . prostate biopsies     x12, cryotherapy  . RADIOACTIVE SEED IMPLANT N/A 10/22/2016   Procedure: RADIOACTIVE  SEED IMPLANT/BRACHYTHERAPY IMPLANT, URETHRAL STRICTURE DIALIATION;  Surgeon: Carolan Clines, MD;  Location: [facility];  Service: Urology;  Laterality: N/A;  . SHOULDER OPEN ROTATOR CUFF REPAIR Left 09-04-2014  . skin cancer L ear     Dr [name]   . TOTAL KNEE ARTHROPLASTY Bilateral right 08-13-2008  &  left 11-14-2008  . UMBILICAL HERNIA REPAIR  06-23-2012  . ureter stretch  05/02/2019   [facility] Dr [name]    There were no vitals filed for this visit.   Subjective Assessment - 12/24/20 1350    Subjective Patient reports today he is feeling the best he has felt in the last 8 weeks. He  reports no dizziness today and back pain is 0-1/10 currently. States he saw his doctor for sleep apnea and doctor thought his blood pressure was fun. Reports he felt good after last treatment session and feels dry needling and manual therapy is the most helpful to him.    Pertinent History Cullin "Jeremiah" Zhang is a 32yoM who reports several months of 'serious' back issues, no help after attempted chiropractic treatment. Pt denies any particular contributing event.  Pt has been as high at 8-9/10, but since starting new medication 1 month prior, reports pain has been improved to 5-6/10 instance, but does not have pain all the time. Pt denies any pain migration, denies any pain or paresthesias of the legs. Pt has had xrays done showing scoliosis, otherwise no concerning findings. Pt has been taking tylenol daily for a month now. Pt has aggravation of pain with lifting items 35lbs or more, also with getting in/out of bed. Remote hx bilat TKA. 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 His wife is a retired Therapist, sports who worked at [employer].    Currently in Pain? Yes    Pain Score 1            TREATMENT:  Therapeutic exercise:to centralize symptoms and improve ROM, strength, muscular endurance, and activity tolerance required for successful completion of functional activities.  - NuStep level5using bilateral upper and lower extremities. Seat/handle setting 10. For improved extremity mobility, muscular endurance, and activity tolerance; and to induce the analgesic effect of aerobic exercise, stimulate improved joint nutrition, and prepare body structures and systems for following interventions. x5 minutes. Average SPM =60. - seated lumbar flexion theraball roll out x 11 to the front, x 15 diagonal alternating sides. (needling/manual - see below) - sit <>stand from 17inch chair, withgreentheraband around knees for isometric hip abduction, holding 20#KB, 3x10.Cuing for glute  squeeze.  - Deadlift with 20#KB, 3x10 - standing pallof press with10# cable,2x20 each side.  Manual therapy: to reduce pain and tissue tension, improve range of motion, neuromodulation, in order to promote improved ability to complete functional activities. - prone STM to lumbar paraspinals, QL, and proximal glutes.   Modality: (unbilled) Dry needling performed to right lower back to decrease pain and spasms along patient's lumbar region with patient in prone utilizing (2) dry needle(s) .51m x 728mwith (3) sticks at right lumbar multifidi and (1) [facility] approximately L4 level and (1) stick at R proximal glute max . Patient educated about the risks and benefits from therapy and verbally consents to treatment. Dry needling performed by SaEverlean AlstromSnGraylon GoodT, DPT who is certified in this technique.  Pt required multimodal cuing for proper technique and to facilitate improved neuromuscular control, strength, range of motion, and functional ability resulting in improved performance and form.  PT Education - 12/24/20 1359    Education Details exercise technique/form    Person(s) Educated Patient    Methods Explanation;Demonstration;Tactile cues;Verbal cues;Handout    Comprehension Verbalized understanding;Returned demonstration;Verbal cues required;Tactile cues required;Need further instruction            PT Short Term Goals - 12/11/20 1130      PT SHORT TERM GOAL #1   Title After 4 weeks pt to demonstrate improved Rt SLS >10 sec.    Baseline <3sec; deferred measurement to next session    Time 4    Period Weeks    Status Deferred    Target Date 12/03/20             PT Long Term Goals - 12/11/20 1130      PT LONG TERM GOAL #1   Title After 8 weeks pt to demonstrate seated Rt hip ER strength 5/5.    Baseline 4/5    Time 8    Period Weeks    Status Achieved    Target Date 12/31/20      PT LONG TERM GOAL #2   Title After 12 weeks pt to demonstrate  improved FOTO score to >70    Baseline 40/100 at eval (11/02/2020); 55/100 at visit 10 (12/11/2020)    Time 12    Period Weeks    Status Partially Met    Target Date 03/05/21      PT LONG TERM GOAL #3   Title After 8 weeks pt to demonstrate 5xSTS <11 seconds to demonstrate improve function of hip/back extension.    Baseline 12 seconds from 17 inch chair with no UE support (12/11/2020);    Time 8    Period Weeks    Status Partially Met                 Plan - 12/24/20 1423    Clinical Impression Statement Patient tolerated treatment well overall and continues to feel better between sessions. Continued with dry needling and manual therapy due to good results after last session Continued with functional strengthening but pt continues to report discomfort R glute/back during deadlifts. Patient would benefit from continued management of limiting condition by skilled physical therapist to address remaining impairments and functional limitations to work towards stated goals and return to PLOF or maximal functional independence.    Personal Factors and Comorbidities Age;Behavior Pattern;Fitness;Past/Current Experience    Examination-Activity Limitations Bathing;Locomotion Level;Bend;Carry;Sleep;Dressing;Stairs;Stand    Examination-Participation Restrictions Community Activity;Interpersonal Relationship;Yard Work    Stability/Clinical Decision Making Stable/Uncomplicated    Rehab Potential Good    PT Frequency 2x / week    PT Duration 12 weeks    PT Treatment/Interventions ADLs/Self Care Home Management;Gait training;Stair training;Joint Manipulations;Dry needling;Passive range of motion;Patient/family education;Therapeutic exercise;Balance training;Neuromuscular re-education;DME Instruction;Moist Heat;Electrical Stimulation    PT Next Visit Plan low back and hip strengthening exercises, manual therapy as needed    PT Home Exercise Plan Medbridge Access Code: [email or web address]    Consulted and Agree  with Plan of Care Patient           Patient will benefit from skilled therapeutic intervention in order to improve the following deficits and impairments:  Abnormal gait,Decreased range of motion,Difficulty walking,Increased muscle spasms,Decreased activity tolerance,Decreased knowledge of precautions,Decreased balance,Decreased mobility,Decreased strength,Improper body mechanics  Visit Diagnosis: Chronic right-sided low back pain without sciatica  Stiffness of right hip, not elsewhere classified     Problem List Patient Active Problem List   Diagnosis Date Noted  . Personal history of  other malignant neoplasm of skin 01/02/2020  . GERD (gastroesophageal reflux disease) 08/28/2019  . Hyperglycemia 08/28/2019  . Hyperlipidemia 08/28/2019  . Hypertension 08/28/2019  . Obesity 08/28/2019  . Myalgia due to statin 12/28/2018  . Erectile dysfunction following radiation therapy 08/10/2018  . Hypogonadism in male 05/11/2018  . Diabetes mellitus without complication ([place]) 06/81/6619  . Prostate cancer ([place]) 07/14/2016  . Status post rotator cuff repair 01/05/2015  . Encounter for long-term (current) use of other medications 01/15/2014  . Benign prostatic hyperplasia without lower urinary tract symptoms 06/27/2012    Everlean Alstrom. Graylon Good, PT, DPT 12/24/20, 2:25 PM   [facility] [address] [place], [address] Phone: [phone]   Fax:  [phone]  Name: BYRANT VALENT MRN: [id number] Date of Birth: Jan 18, 1946

## 2020-12-26 DIAGNOSIS — G4733 Obstructive sleep apnea (adult) (pediatric): Secondary | ICD-10-CM | POA: Diagnosis not present

## 2020-12-29 ENCOUNTER — Ambulatory Visit: Payer: PPO | Admitting: Physical Therapy

## 2020-12-29 ENCOUNTER — Encounter: Payer: Self-pay | Admitting: Physical Therapy

## 2020-12-29 ENCOUNTER — Other Ambulatory Visit: Payer: Self-pay

## 2020-12-29 DIAGNOSIS — M545 Low back pain, unspecified: Secondary | ICD-10-CM

## 2020-12-29 DIAGNOSIS — G8929 Other chronic pain: Secondary | ICD-10-CM

## 2020-12-29 DIAGNOSIS — M25651 Stiffness of right hip, not elsewhere classified: Secondary | ICD-10-CM

## 2020-12-29 NOTE — Therapy (Signed)
[place] [facility] [address], [place], [address] Phone: [phone]   Fax:  [phone]  Physical Therapy Treatment  Patient Details  Name: Jeremiah Zhang MRN: [id number] Date of Birth: 1946/04/28 Referring Provider (PT): [name]   Encounter Date: 12/29/2020   PT End of Session - 12/29/20 1128    Visit Number 15    Number of Visits 34    Date for PT Re-Evaluation 03/05/21    Authorization Type Medicare reporting period from 12/09/2020    Progress Note Due on Visit 10    PT Start Time 1120    PT Stop Time 1200    PT Time Calculation (min) 40 min    Activity Tolerance Patient tolerated treatment well    Behavior During Therapy [facility] for tasks assessed/performed           Past Medical History:  Diagnosis Date  . Actinic keratosis   . Anticoagulant long-term use    ELIQUIS  . Arthritis   . Basal cell carcinoma 01/27/2010   Right spinal upper back. Superficial.   . Basal cell carcinoma 11/15/2019   Left inferior antihelix post. to tragus. Superficial and nodular patterns. Excised: 11/27/2019  . Basal cell carcinoma 11/15/2019   Left mid lateral nose. Nodular and infiltrative patterns. Excised: 11/27/2019  . Basal cell carcinoma 11/27/2019   Left mid superior helix. BCC with focal sclerosis.  . Basal cell carcinoma 01/29/2020   Left mid superior ear helix  . 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 Slight atypia. Edges free.  . ED (erectile dysfunction)   . GERD (gastroesophageal reflux disease)   . Hiatal hernia   . History of DVT of lower extremity    POST OP KNEE SURGERY  . History of transient ischemic attack (TIA)    PER PT HX  ?POSSIBLE-- PT ON ELIQUIS  . Hyperlipidemia   . Hypertension   . Hypogonadism male   . OSA (obstructive sleep apnea)    per pt positive sleep study yrs ago recommended CPAP --  pt Non- compliant -- states uses Breath Rite Nasal Strips  . Peripheral neuropathy    . Prostate cancer [facility]) urologist-  dr tannenbaum/  oncologist-- dr [name]   dx 03-25-2015  T1c, Gleason 4+3  s/p  cryoablation prostate 06-02-2015/  external  radiation therapy 08-16-2016 to 09-20-2016/  scheduled for radioactive seed implants 10-22-2016  . TIA (transient ischemic attack)    ?? 08/20/2014? Eliquis 07/02/15  . Type 2 diabetes mellitus ([place])    followed by dr [name] [facility] clinic)  last A1c 8.4 on 07-27-2016 per lov note  . Umbilical hernia   . Wears glasses   . Wears hearing aid    bilateral    Past Surgical History:  Procedure Laterality Date  . COLONOSCOPY WITH PROPOFOL N/A 05/30/2017   Procedure: COLONOSCOPY WITH PROPOFOL;  Surgeon: [name], MD;  Location: [facility] ENDOSCOPY;  Service: Endoscopy;  Laterality: N/A;  . CRYOABLATION N/A 06/02/2015   Procedure: CRYO ABLATION PROSTATE;  Surgeon: Carolan Clines, MD;  Location: [facility];  Service: Urology;  Laterality: N/A;  . CYSTOSCOPY WITH URETHRAL DILATATION N/A 05/04/2019   Procedure: CYSTOSCOPY WITH URETHRAL DILATATION;  Surgeon: [name], MD;  Location: [facility];  Service: Urology;  Laterality: N/A;  . ELBOW BURSA SURGERY Left Feb 1997  . prostate biopsies     x12, cryotherapy  . RADIOACTIVE SEED IMPLANT N/A 10/22/2016   Procedure: RADIOACTIVE  SEED IMPLANT/BRACHYTHERAPY IMPLANT, URETHRAL STRICTURE DIALIATION;  Surgeon: Carolan Clines, MD;  Location: [facility];  Service: Urology;  Laterality: N/A;  . SHOULDER OPEN ROTATOR CUFF REPAIR Left 09-04-2014  . skin cancer L ear     Dr [name]   . TOTAL KNEE ARTHROPLASTY Bilateral right 08-13-2008  &  left 11-14-2008  . UMBILICAL HERNIA REPAIR  06-23-2012  . ureter stretch  05/02/2019   [facility] Dr [name]    There were no vitals filed for this visit.   Subjective Assessment - 12/29/20 1125    Subjective Patient reports he is feeling well today but continues to have 1/10 pain in the  right lower back. States he felt okay following last treatment session but he feels like this last bit of pain is never quite reached during PT.    Pertinent History Jeremiah Zhang is a 63yoM who reports several months of 'serious' back issues, no help after attempted chiropractic treatment. Pt denies any particular contributing event.  Pt has been as high at 8-9/10, but since starting new medication 1 month prior, reports pain has been improved to 5-6/10 instance, but does not have pain all the time. Pt denies any pain migration, denies any pain or paresthesias of the legs. Pt has had xrays done showing scoliosis, otherwise no concerning findings. Pt has been taking tylenol daily for a month now. Pt has aggravation of pain with lifting items 35lbs or more, also with getting in/out of bed. Remote hx bilat TKA. 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 His wife is a retired Therapist, sports who worked at [employer].    Currently in Pain? Yes    Pain Score 1           FOTO = 61 (12/29/2020)   TREATMENT:  Therapeutic exercise:to centralize symptoms and improve ROM, strength, muscular endurance, and activity tolerance required for successful completion of functional activities.  - NuStep level5using bilateral upper and lower extremities. Seat/handle setting 10. For improved extremity mobility, muscular endurance, and activity tolerance; and to induce the analgesic effect of aerobic exercise, stimulate improved joint nutrition, and prepare body structures and systems for following interventions. x65 minutes. Average SPM =74. (needling/manual - see below) - supine QL stretch 3x30 seconds each side - Education on HEP including handout   Manual therapy: to reduce pain and tissue tension, improve range of motion, neuromodulation, in order to promote improved ability to complete functional activities. - prone STM to lumbar paraspinals, QL.   Modality: (unbilled) Dry needling  performed toright lower backto decrease pain and spasms along patient'slumbarregion with patient inproneutilizing (3) dry needle(s) .88m x7110mwith (5) sticks atright lumbar multifidi levels 4-5 and (1) stick at R Iliocostalis at approximately L4 level. Patient educated about the risks and benefits from therapy and verbally consents to treatment. Dry needling performed by SaEverlean AlstromSnGraylon GoodT, DPT who is certified in this technique.  Pt required multimodal cuing for proper technique and to facilitate improved neuromuscular control, strength, range of motion, and functional ability resulting in improved performance and form.   HOME EXERCISE PROGRAM Access Code: [email or web address]: [email or web address] Date: 12/29/2020 Prepared by: SaRosita KeaExercises Supine Lower Trunk Rotation - 1 x daily - 1 sets - 20 reps Prone Hip Extension - 1 x daily - 3 sets - 10 reps Standing hip abduction with band around ankles - 1 x daily - 3 sets - 10 reps - 1 second hold Supine Quadratus Lumborum Stretch -  1-2 x daily - 2-3 reps - 30 seconds hold    PT Education - 12/29/20 1128    Education Details exercise technique/form    Person(s) Educated Patient    Methods Explanation;Demonstration;Tactile cues;Verbal cues;Handout    Comprehension Verbalized understanding;Returned demonstration;Verbal cues required;Tactile cues required;Need further instruction            PT Short Term Goals - 12/11/20 1130      PT SHORT TERM GOAL #1   Title After 4 weeks pt to demonstrate improved Rt SLS >10 sec.    Baseline <3sec; deferred measurement to next session    Time 4    Period Weeks    Status Deferred    Target Date 12/03/20             PT Long Term Goals - 12/11/20 1130      PT LONG TERM GOAL #1   Title After 8 weeks pt to demonstrate seated Rt hip ER strength 5/5.    Baseline 4/5    Time 8    Period Weeks    Status Achieved    Target Date 12/31/20      PT LONG TERM GOAL #2    Title After 12 weeks pt to demonstrate improved FOTO score to >70    Baseline 40/100 at eval (11/02/2020); 55/100 at visit 10 (12/11/2020)    Time 12    Period Weeks    Status Partially Met    Target Date 03/05/21      PT LONG TERM GOAL #3   Title After 8 weeks pt to demonstrate 5xSTS <11 seconds to demonstrate improve function of hip/back extension.    Baseline 12 seconds from 17 inch chair with no UE support (12/11/2020);    Time 8    Period Weeks    Status Partially Met                 Plan - 12/29/20 1732    Clinical Impression Statement Patient tolerated treatment well overall. More time with needles inserted due to stronger and longer muscle response. Patient had period with no pain as muscles responded with twitch and relaxation. Reported slightly less pain at end of session. Has experienced overall improvement demonstrated on FOTO score of 61 today (improved from 42 at initial eval) but continues to be bothered by 1/10 pain that has not yet been fully addressed. Patient would benefit from continued management of limiting condition by skilled physical therapist to address remaining impairments and functional limitations to work towards stated goals and return to PLOF or maximal functional independence.    Personal Factors and Comorbidities Age;Behavior Pattern;Fitness;Past/Current Experience    Examination-Activity Limitations Bathing;Locomotion Level;Bend;Carry;Sleep;Dressing;Stairs;Stand    Examination-Participation Restrictions Community Activity;Interpersonal Relationship;Yard Work    Stability/Clinical Decision Making Stable/Uncomplicated    Rehab Potential Good    PT Frequency 2x / week    PT Duration 12 weeks    PT Treatment/Interventions ADLs/Self Care Home Management;Gait training;Stair training;Joint Manipulations;Dry needling;Passive range of motion;Patient/family education;Therapeutic exercise;Balance training;Neuromuscular re-education;DME Instruction;Moist  Heat;Electrical Stimulation    PT Next Visit Plan low back and hip strengthening exercises, manual therapy as needed    PT Home Exercise Plan Medbridge Access Code: [email or web address]    Consulted and Agree with Plan of Care Patient           Patient will benefit from skilled therapeutic intervention in order to improve the following deficits and impairments:  Abnormal gait,Decreased range of motion,Difficulty walking,Increased muscle spasms,Decreased activity tolerance,Decreased knowledge of precautions,Decreased  balance,Decreased mobility,Decreased strength,Improper body mechanics  Visit Diagnosis: Chronic right-sided low back pain without sciatica  Stiffness of right hip, not elsewhere classified     Problem List Patient Active Problem List   Diagnosis Date Noted  . Personal history of other malignant neoplasm of skin 01/02/2020  . GERD (gastroesophageal reflux disease) 08/28/2019  . Hyperglycemia 08/28/2019  . Hyperlipidemia 08/28/2019  . Hypertension 08/28/2019  . Obesity 08/28/2019  . Myalgia due to statin 12/28/2018  . Erectile dysfunction following radiation therapy 08/10/2018  . Hypogonadism in male 05/11/2018  . Diabetes mellitus without complication ([place]) 92/95/7473  . Prostate cancer ([place]) 07/14/2016  . Status post rotator cuff repair 01/05/2015  . Encounter for long-term (current) use of other medications 01/15/2014  . Benign prostatic hyperplasia without lower urinary tract symptoms 06/27/2012    Everlean Alstrom. Graylon Good, PT, DPT 12/29/20, 5:34 PM  [facility] [address] [place], [address] Phone: [phone]   Fax:  [phone]  Name: Jeremiah Zhang MRN: [id number] Date of Birth: 06/13/1946

## 2020-12-31 ENCOUNTER — Other Ambulatory Visit: Payer: Self-pay

## 2020-12-31 ENCOUNTER — Ambulatory Visit: Payer: PPO | Admitting: Physical Therapy

## 2020-12-31 DIAGNOSIS — M545 Low back pain, unspecified: Secondary | ICD-10-CM | POA: Diagnosis not present

## 2020-12-31 DIAGNOSIS — M25651 Stiffness of right hip, not elsewhere classified: Secondary | ICD-10-CM

## 2020-12-31 DIAGNOSIS — G8929 Other chronic pain: Secondary | ICD-10-CM

## 2020-12-31 NOTE — Therapy (Signed)
[place] [facility] [address] [place], [address] Phone: [phone]   Fax:  [phone]  Physical Therapy Treatment  Patient Details  Name: Jeremiah Zhang MRN: [id number] Date of Birth: September 02, 1946 Referring Provider (PT): [name]   Encounter Date: 12/31/2020   PT End of Session - 12/31/20 1357    Visit Number 16    Number of Visits 34    Date for PT Re-Evaluation 03/05/21    Authorization Type Medicare reporting period from 12/09/2020    Progress Note Due on Visit 10    PT Start Time 8341    PT Stop Time 1427    PT Time Calculation (min) 38 min    Activity Tolerance Patient tolerated treatment well    Behavior During Therapy [facility] for tasks assessed/performed           Past Medical History:  Diagnosis Date  . Actinic keratosis   . Anticoagulant long-term use    ELIQUIS  . Arthritis   . Basal cell carcinoma 01/27/2010   Right spinal upper back. Superficial.   . Basal cell carcinoma 11/15/2019   Left inferior antihelix post. to tragus. Superficial and nodular patterns. Excised: 11/27/2019  . Basal cell carcinoma 11/15/2019   Left mid lateral nose. Nodular and infiltrative patterns. Excised: 11/27/2019  . Basal cell carcinoma 11/27/2019   Left mid superior helix. BCC with focal sclerosis.  . Basal cell carcinoma 01/29/2020   Left mid superior ear helix  . 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 Slight atypia. Edges free.  . ED (erectile dysfunction)   . GERD (gastroesophageal reflux disease)   . Hiatal hernia   . History of DVT of lower extremity    POST OP KNEE SURGERY  . History of transient ischemic attack (TIA)    PER PT HX  ?POSSIBLE-- PT ON ELIQUIS  . Hyperlipidemia   . Hypertension   . Hypogonadism male   . OSA (obstructive sleep apnea)    per pt positive sleep study yrs ago recommended CPAP --  pt Non- compliant -- states uses Breath Rite Nasal Strips  . Peripheral neuropathy    . Prostate cancer [facility]) urologist-  dr tannenbaum/  oncologist-- dr [name]   dx 03-25-2015  T1c, Gleason 4+3  s/p  cryoablation prostate 06-02-2015/  external  radiation therapy 08-16-2016 to 09-20-2016/  scheduled for radioactive seed implants 10-22-2016  . TIA (transient ischemic attack)    ?? 08/20/2014? Eliquis 07/02/15  . Type 2 diabetes mellitus ([place])    followed by dr [name] [facility] clinic)  last A1c 8.4 on 07-27-2016 per lov note  . Umbilical hernia   . Wears glasses   . Wears hearing aid    bilateral    Past Surgical History:  Procedure Laterality Date  . COLONOSCOPY WITH PROPOFOL N/A 05/30/2017   Procedure: COLONOSCOPY WITH PROPOFOL;  Surgeon: [name], MD;  Location: [facility] ENDOSCOPY;  Service: Endoscopy;  Laterality: N/A;  . CRYOABLATION N/A 06/02/2015   Procedure: CRYO ABLATION PROSTATE;  Surgeon: Carolan Clines, MD;  Location: [facility];  Service: Urology;  Laterality: N/A;  . CYSTOSCOPY WITH URETHRAL DILATATION N/A 05/04/2019   Procedure: CYSTOSCOPY WITH URETHRAL DILATATION;  Surgeon: [name], MD;  Location: [facility];  Service: Urology;  Laterality: N/A;  . ELBOW BURSA SURGERY Left Feb 1997  . prostate biopsies     x12, cryotherapy  . RADIOACTIVE SEED IMPLANT N/A 10/22/2016   Procedure: RADIOACTIVE  SEED IMPLANT/BRACHYTHERAPY IMPLANT, URETHRAL STRICTURE DIALIATION;  Surgeon: Carolan Clines, MD;  Location: [facility];  Service: Urology;  Laterality: N/A;  . SHOULDER OPEN ROTATOR CUFF REPAIR Left 09-04-2014  . skin cancer L ear     Dr [name]   . TOTAL KNEE ARTHROPLASTY Bilateral right 08-13-2008  &  left 11-14-2008  . UMBILICAL HERNIA REPAIR  06-23-2012  . ureter stretch  05/02/2019   [facility] Dr [name]    There were no vitals filed for this visit.   Subjective Assessment - 12/31/20 1355    Subjective Patient reports he is feeling better today than any other day since he started  PT. He states his pain is 1/10 in his right lumbar region and pain is "less than the pain of seeing high gas prices"    Pertinent History Jeremiah Zhang is a 74yoM who reports several months of 'serious' back issues, no help after attempted chiropractic treatment. Pt denies any particular contributing event.  Pt has been as high at 8-9/10, but since starting new medication 1 month prior, reports pain has been improved to 5-6/10 instance, but does not have pain all the time. Pt denies any pain migration, denies any pain or paresthesias of the legs. Pt has had xrays done showing scoliosis, otherwise no concerning findings. Pt has been taking tylenol daily for a month now. Pt has aggravation of pain with lifting items 35lbs or more, also with getting in/out of bed. Remote hx bilat TKA. 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 His wife is a retired Therapist, sports who worked at [employer].    Currently in Pain? Yes    Pain Score 1     Pain Location Back    Pain Orientation Right;Lower    Pain Descriptors / Indicators Dull    Pain Type Chronic pain           TREATMENT:  Therapeutic exercise:to centralize symptoms and improve ROM, strength, muscular endurance, and activity tolerance required for successful completion of functional activities. - NuStep level5using bilateral upper and lower extremities. Seat/handle setting 10. For improved extremity mobility, muscular endurance, and activity tolerance; and to induce the analgesic effect of aerobic exercise, stimulate improved joint nutrition, and prepare body structures and systems for following interventions. x65 minutes. Average SPM =68.  (needling/manual - see below)  - supine QL stretch 3x30 seconds each side   Manual therapy:to reduce pain and tissue tension, improve range of motion, neuromodulation, in order to promote improved ability to complete functional activities. - prone STM to R lumbar/thopracic paraspinals,  QL.   Modality: (unbilled) Dry needling performed toright lower backto decrease pain and spasms along patient'slumbarregion with patient inproneutilizing (2) dry needle(s) .85m x726mwith (4) sticks atright lumbar multifidi levels 4-5 and(1) stick at RIliocostalis at approximately L4 level. Patient educated about the risks and benefits from therapy and verbally consents to treatment. Dry needling performed by SaEverlean AlstromSnGraylon GoodT, DPT who is certified in this technique.  Pt required multimodal cuing for proper technique and to facilitate improved neuromuscular control, strength, range of motion, and functional ability resulting in improved performance and form.  HOME EXERCISE PROGRAM Access Code: [email or web address]: [email or web address] Date: 12/29/2020 Prepared by: SaRosita KeaExercises Supine Lower Trunk Rotation - 1 x daily - 1 sets - 20 reps Prone Hip Extension - 1 x daily - 3 sets - 10 reps Standing hip abduction with band around ankles - 1 x daily - 3  sets - 10 reps - 1 second hold Supine Quadratus Lumborum Stretch - 1-2 x daily - 2-3 reps - 30 seconds hold    PT Education - 12/31/20 1357    Education Details exercise technique/form. Dry Needling    Person(s) Educated Patient    Methods Explanation;Demonstration;Tactile cues;Verbal cues    Comprehension Verbalized understanding;Returned demonstration;Verbal cues required;Tactile cues required;Need further instruction            PT Short Term Goals - 12/11/20 1130      PT SHORT TERM GOAL #1   Title After 4 weeks pt to demonstrate improved Rt SLS >10 sec.    Baseline <3sec; deferred measurement to next session    Time 4    Period Weeks    Status Deferred    Target Date 12/03/20             PT Long Term Goals - 12/11/20 1130      PT LONG TERM GOAL #1   Title After 8 weeks pt to demonstrate seated Rt hip ER strength 5/5.    Baseline 4/5    Time 8    Period Weeks    Status Achieved     Target Date 12/31/20      PT LONG TERM GOAL #2   Title After 12 weeks pt to demonstrate improved FOTO score to >70    Baseline 40/100 at eval (11/02/2020); 55/100 at visit 10 (12/11/2020)    Time 12    Period Weeks    Status Partially Met    Target Date 03/05/21      PT LONG TERM GOAL #3   Title After 8 weeks pt to demonstrate 5xSTS <11 seconds to demonstrate improve function of hip/back extension.    Baseline 12 seconds from 17 inch chair with no UE support (12/11/2020);    Time 8    Period Weeks    Status Partially Met                 Plan - 12/31/20 1427    Clinical Impression Statement Patient tolerated treatment well overall. Continued with similar intervention to last session due to improved pain following. Patient gets better QL stretch if he rolls leg toward leg that is crossed over. Patient would benefit from continued management of limiting condition by skilled physical therapist to address remaining impairments and functional limitations to work towards stated goals and return to PLOF or maximal functional independence.    Personal Factors and Comorbidities Age;Behavior Pattern;Fitness;Past/Current Experience    Examination-Activity Limitations Bathing;Locomotion Level;Bend;Carry;Sleep;Dressing;Stairs;Stand    Examination-Participation Restrictions Community Activity;Interpersonal Relationship;Yard Work    Stability/Clinical Decision Making Stable/Uncomplicated    Rehab Potential Good    PT Frequency 2x / week    PT Duration 12 weeks    PT Treatment/Interventions ADLs/Self Care Home Management;Gait training;Stair training;Joint Manipulations;Dry needling;Passive range of motion;Patient/family education;Therapeutic exercise;Balance training;Neuromuscular re-education;DME Instruction;Moist Heat;Electrical Stimulation    PT Next Visit Plan low back and hip strengthening exercises, manual therapy as needed    PT Home Exercise Plan Medbridge Access Code: [email or web address]    Consulted  and Agree with Plan of Care Patient           Patient will benefit from skilled therapeutic intervention in order to improve the following deficits and impairments:  Abnormal gait,Decreased range of motion,Difficulty walking,Increased muscle spasms,Decreased activity tolerance,Decreased knowledge of precautions,Decreased balance,Decreased mobility,Decreased strength,Improper body mechanics  Visit Diagnosis: Chronic right-sided low back pain without sciatica  Stiffness of right hip, not elsewhere classified  Problem List Patient Active Problem List   Diagnosis Date Noted  . Personal history of other malignant neoplasm of skin 01/02/2020  . GERD (gastroesophageal reflux disease) 08/28/2019  . Hyperglycemia 08/28/2019  . Hyperlipidemia 08/28/2019  . Hypertension 08/28/2019  . Obesity 08/28/2019  . Myalgia due to statin 12/28/2018  . Erectile dysfunction following radiation therapy 08/10/2018  . Hypogonadism in male 05/11/2018  . Diabetes mellitus without complication ([place]) 29/29/0903  . Prostate cancer ([place]) 07/14/2016  . Status post rotator cuff repair 01/05/2015  . Encounter for long-term (current) use of other medications 01/15/2014  . Benign prostatic hyperplasia without lower urinary tract symptoms 06/27/2012    Everlean Alstrom. Graylon Good, PT, DPT 12/31/20, 2:28 PM  [place] [facility] [address], [place], [address] Phone: [phone]   Fax:  [phone]  Name: Jeremiah Zhang MRN: [id number] Date of Birth: 1946/05/29

## 2021-01-05 ENCOUNTER — Encounter: Payer: Self-pay | Admitting: Physical Therapy

## 2021-01-05 ENCOUNTER — Ambulatory Visit: Payer: PPO | Admitting: Physical Therapy

## 2021-01-05 ENCOUNTER — Other Ambulatory Visit: Payer: Self-pay

## 2021-01-05 DIAGNOSIS — M545 Low back pain, unspecified: Secondary | ICD-10-CM | POA: Diagnosis not present

## 2021-01-05 DIAGNOSIS — M25651 Stiffness of right hip, not elsewhere classified: Secondary | ICD-10-CM

## 2021-01-05 DIAGNOSIS — G8929 Other chronic pain: Secondary | ICD-10-CM

## 2021-01-05 NOTE — Therapy (Signed)
[place] [facility] [address] [place], [address] Phone: [phone]   Fax:  [phone]  Physical Therapy Treatment  Patient Details  Name: Jeremiah Zhang MRN: [id number] Date of Birth: 01/21/1946 Referring Provider (PT): [name]   Encounter Date: 01/05/2021   PT End of Session - 01/06/21 1459    Visit Number 17    Number of Visits 34    Date for PT Re-Evaluation 03/05/21    Authorization Type Medicare reporting period from 12/09/2020    Progress Note Due on Visit 10    PT Start Time 1347    PT Stop Time 1427    PT Time Calculation (min) 40 min    Activity Tolerance Patient tolerated treatment well    Behavior During Therapy [facility] for tasks assessed/performed           Past Medical History:  Diagnosis Date   Actinic keratosis    Anticoagulant long-term use    ELIQUIS   Arthritis    Basal cell carcinoma 01/27/2010   Right spinal upper back. Superficial.    Basal cell carcinoma 11/15/2019   Left inferior antihelix post. to tragus. Superficial and nodular patterns. Excised: 11/27/2019   Basal cell carcinoma 11/15/2019   Left mid lateral nose. Nodular and infiltrative patterns. Excised: 11/27/2019   Basal cell carcinoma 11/27/2019   Left mid superior helix. BCC with focal sclerosis.   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 Slight atypia. Edges free.   ED (erectile dysfunction)    GERD (gastroesophageal reflux disease)    Hiatal hernia    History of DVT of lower extremity    POST OP KNEE SURGERY   History of transient ischemic attack (TIA)    PER PT HX  ?POSSIBLE-- PT ON ELIQUIS   Hyperlipidemia    Hypertension    Hypogonadism male    OSA (obstructive sleep apnea)    per pt positive sleep study yrs ago recommended CPAP --  pt Non- compliant -- states uses Breath Rite Nasal Strips   Peripheral neuropathy     Prostate cancer [facility]) urologist-  dr [name]  oncologist-- dr [name]   dx 03-25-2015  T1c, Gleason 4+3  s/p  cryoablation prostate 06-02-2015/  external  radiation therapy 08-16-2016 to 09-20-2016/  scheduled for radioactive seed implants 10-22-2016   TIA (transient ischemic attack)    ?? 08/20/2014? Eliquis 07/02/15   Type 2 diabetes mellitus ([place])    followed by dr [name] [facility] clinic)  last A1c 8.4 on 07-27-2016 per lov note   Umbilical hernia    Wears glasses    Wears hearing aid    bilateral    Past Surgical History:  Procedure Laterality Date   COLONOSCOPY WITH PROPOFOL N/A 05/30/2017   Procedure: COLONOSCOPY WITH PROPOFOL;  Surgeon: [name], MD;  [facility]: [facility] ENDOSCOPY;  Service: Endoscopy;  Laterality: N/A;   CRYOABLATION N/A 06/02/2015   Procedure: CRYO ABLATION PROSTATE;  Surgeon: Carolan Clines, MD;  Location: [facility];  Service: Urology;  Laterality: N/A;   CYSTOSCOPY WITH URETHRAL DILATATION N/A 05/04/2019   Procedure: CYSTOSCOPY WITH URETHRAL DILATATION;  Surgeon: [name], MD;  Location: [facility];  Service: Urology;  Laterality: N/A;   ELBOW BURSA SURGERY Left Feb 1997   prostate biopsies     x12, cryotherapy   RADIOACTIVE SEED IMPLANT N/A 10/22/2016   Procedure: RADIOACTIVE  SEED IMPLANT/BRACHYTHERAPY IMPLANT, URETHRAL STRICTURE DIALIATION;  Surgeon: Carolan Clines, MD;  Location: [facility];  Service: Urology;  Laterality: N/A;   SHOULDER OPEN ROTATOR CUFF REPAIR Left 09-04-2014   skin cancer L ear     Dr [name]    TOTAL KNEE ARTHROPLASTY Bilateral right 08-13-2008  &  left [id number]   UMBILICAL HERNIA REPAIR  06-23-2012   ureter stretch  05/02/2019   [facility] Dr [name]    There were no vitals filed for this visit.   Subjective Assessment - 01/05/21 1353    Subjective Patient reports 2/10 pain in the right lower back. He has felt that PT helped  him overall but cannot get past the last bit of pain of about 1/10. He lifted about 7 small children's toy cars that are  45-50 lbs each with help from a relative. He did this Saturday morning and it didn't bother him much at the time, but then gradually got worse over time and by evening he was unsure if he was going to feel good enough to go to church in the morning. He woke up feeling better and was able to go to church and is continueing to feel better today. Is thinking he may transition to going back to water aerobics class after his wife is cleared by her her doctor after her second eye surgery tomorrow (maybe 2 weeks from surgery).    Pertinent History Jeremiah Zhang is a 39yoM who reports several months of 'serious' back issues, no help after attempted chiropractic treatment. Pt denies any particular contributing event.  Pt has been as high at 8-9/10, but since starting new medication 1 month prior, reports pain has been improved to 5-6/10 instance, but does not have pain all the time. Pt denies any pain migration, denies any pain or paresthesias of the legs. Pt has had xrays done showing scoliosis, otherwise no concerning findings. Pt has been taking tylenol daily for a month now. Pt has aggravation of pain with lifting items 35lbs or more, also with getting in/out of bed. Remote hx bilat TKA. 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 His wife is a retired Therapist, sports who worked at [employer].    Currently in Pain? Yes    Pain Score 2     Pain Location Back    Pain Orientation Right;Lower           TREATMENT:  Therapeutic exercise:to centralize symptoms and improve ROM, strength, muscular endurance, and activity tolerance required for successful completion of functional activities. - NuStep level5using bilateral upper and lower extremities. Seat/handle setting 10. For improved extremity mobility, muscular endurance, and activity tolerance; and to induce the analgesic  effect of aerobic exercise, stimulate improved joint nutrition, and prepare body structures and systems for following interventions. x5 minutes. Average SPM =70 - standing lumbar extension with hips against table, 1x10 (end range pain, feels like it may be better after), continues to lean back and to each side.  - standing lumbar extension 2x10, (with some rotating side to side while performing this, improved pain following both sets) - prone lumbar extension x 5 (manual - see below) - prone lumbar extension x 5 (improved motion).   -Education on HEP including handout    Manual therapy:to reduce pain and tissue tension, improve range of motion, neuromodulation, in order to promote improved ability to complete functional activities. - prone CPA and R UPA along mid/lower thoracic through lumbar spine with/without KE wedge assist.  Tender at ~ T10 but responsive to repeated CPA and R UPA at that level.   Pt required multimodal cuing for proper technique and to facilitate improved neuromuscular control, strength, range of motion, and functional ability resulting in improved performance and form.  HOME EXERCISE PROGRAM Access Code: [email or web address]  URL: [email or web address] Date: 12/29/2020 Prepared by: Rosita Kea  Exercises Supine Lower Trunk Rotation - 1 x daily - 1 sets - 20 reps Prone Hip Extension - 1 x daily - 3 sets - 10 reps Standing hip abduction with band around ankles - 1 x daily - 3 sets - 10 reps - 1 second hold Supine Quadratus Lumborum Stretch - 1-2 x daily - 2-3 reps - 30 seconds hold  [email or web address] [id number] Home Exercise Program [[id number]] Pick one:   Lumbar Extension in Standing -  Repeat 15 Times, Hold 2 Seconds, Complete 1 Set, Perform 5 Times a Day  Standing lumbar extension vs counter - Dustin -  Repeat 15 Times, Hold 2 Seconds, Complete 1 Set, Perform 5 Times a Day  Standing lumbar extension -  Repeat 15 Times, Hold 2 Seconds, Complete 1 Set, Perform  5 Times a Day     PT Education - 01/06/21 1459    Education Details exercise technique/form.    Person(s) Educated Patient    Methods Explanation;Demonstration;Tactile cues;Verbal cues    Comprehension Verbalized understanding;Returned demonstration;Verbal cues required;Tactile cues required;Need further instruction            PT Short Term Goals - 12/11/20 1130      PT SHORT TERM GOAL #1   Title After 4 weeks pt to demonstrate improved Rt SLS >10 sec.    Baseline <3sec; deferred measurement to next session    Time 4    Period Weeks    Status Deferred    Target Date 12/03/20             PT Long Term Goals - 12/11/20 1130      PT LONG TERM GOAL #1   Title After 8 weeks pt to demonstrate seated Rt hip ER strength 5/5.    Baseline 4/5    Time 8    Period Weeks    Status Achieved    Target Date 12/31/20      PT LONG TERM GOAL #2   Title After 12 weeks pt to demonstrate improved FOTO score to >70    Baseline 40/100 at eval (11/02/2020); 55/100 at visit 10 (12/11/2020)    Time 12    Period Weeks    Status Partially Met    Target Date 03/05/21      PT LONG TERM GOAL #3   Title After 8 weeks pt to demonstrate 5xSTS <11 seconds to demonstrate improve function of hip/back extension.    Baseline 12 seconds from 17 inch chair with no UE support (12/11/2020);    Time 8    Period Weeks    Status Partially Met                 Plan - 01/06/21 1502    Clinical Impression Statement Patient tolerated treatment well overall and appeared to respond to extension based repeated motions exercise with decreased pain. Added to HEP with trial of more frequent sets throughout day to try to improve last bit of pain that patient continues to experience. Patient would benefit from continued management of limiting condition by skilled physical therapist to address remaining impairments and functional limitations to work towards stated goals and return to  PLOF or maximal functional  independence.    Personal Factors and Comorbidities Age;Behavior Pattern;Fitness;Past/Current Experience    Examination-Activity Limitations Bathing;Locomotion Level;Bend;Carry;Sleep;Dressing;Stairs;Stand    Examination-Participation Restrictions Community Activity;Interpersonal Relationship;Yard Work    Stability/Clinical Decision Making Stable/Uncomplicated    Rehab Potential Good    PT Frequency 2x / week    PT Duration 12 weeks    PT Treatment/Interventions ADLs/Self Care Home Management;Gait training;Stair training;Joint Manipulations;Dry needling;Passive range of motion;Patient/family education;Therapeutic exercise;Balance training;Neuromuscular re-education;DME Instruction;Moist Heat;Electrical Stimulation    PT Next Visit Plan low back and hip strengthening exercises, manual therapy as needed;   [email or web address]  [id number] Home Exercise Program ([id number])    PT Home Exercise Plan Medbridge Access Code: [email or web address]    Consulted and Agree with Plan of Care Patient           Patient will benefit from skilled therapeutic intervention in order to improve the following deficits and impairments:  Abnormal gait,Decreased range of motion,Difficulty walking,Increased muscle spasms,Decreased activity tolerance,Decreased knowledge of precautions,Decreased balance,Decreased mobility,Decreased strength,Improper body mechanics  Visit Diagnosis: Chronic right-sided low back pain without sciatica  Stiffness of right hip, not elsewhere classified     Problem List Patient Active Problem List   Diagnosis Date Noted   Personal history of other malignant neoplasm of skin 01/02/2020   GERD (gastroesophageal reflux disease) 08/28/2019   Hyperglycemia 08/28/2019   Hyperlipidemia 08/28/2019   Hypertension 08/28/2019   Obesity 08/28/2019   Myalgia due to statin 12/28/2018   Erectile dysfunction following radiation therapy 08/10/2018   Hypogonadism in male 05/11/2018   Diabetes mellitus without  complication ([place]) 60/67/7034   Prostate cancer ([place]) 07/14/2016   Status post rotator cuff repair 01/05/2015   Encounter for long-term (current) use of other medications 01/15/2014   Benign prostatic hyperplasia without lower urinary tract symptoms 06/27/2012    Everlean Alstrom. Graylon Good, PT, DPT 01/06/21, 3:03 PM  [place] [facility] [address] [place], [address] Phone: [phone]   Fax:  [phone]  Name: AJENE CARCHI MRN: [id number] Date of Birth: 1946-05-11

## 2021-01-06 ENCOUNTER — Encounter: Payer: Self-pay | Admitting: Physical Therapy

## 2021-01-07 ENCOUNTER — Ambulatory Visit: Payer: PPO | Admitting: Physical Therapy

## 2021-01-07 ENCOUNTER — Other Ambulatory Visit: Payer: Self-pay

## 2021-01-07 ENCOUNTER — Encounter: Payer: Self-pay | Admitting: Physical Therapy

## 2021-01-07 DIAGNOSIS — M545 Low back pain, unspecified: Secondary | ICD-10-CM

## 2021-01-07 DIAGNOSIS — G8929 Other chronic pain: Secondary | ICD-10-CM

## 2021-01-07 DIAGNOSIS — M25651 Stiffness of right hip, not elsewhere classified: Secondary | ICD-10-CM

## 2021-01-07 NOTE — Therapy (Signed)
[place] [facility] [address], [place], [address] Phone: [phone]   Fax:  [phone]  Physical Therapy Treatment  Patient Details  Name: Jeremiah Zhang MRN: [id number] Date of Birth: 06-25-1946 Referring Provider (PT): [name]   Encounter Date: 01/07/2021   PT End of Session - 01/07/21 1405    Visit Number 18    Number of Visits 34    Date for PT Re-Evaluation 03/05/21    Authorization Type [employer] reporting period from 12/09/2020    Progress Note Due on Visit 10    PT Start Time 1350    PT Stop Time 1428    PT Time Calculation (min) 38 min    Activity Tolerance Patient tolerated treatment well    Behavior During Therapy [facility] for tasks assessed/performed           Past Medical History:  Diagnosis Date  . Actinic keratosis   . Anticoagulant long-term use    ELIQUIS  . Arthritis   . Basal cell carcinoma 01/27/2010   Right spinal upper back. Superficial.   . Basal cell carcinoma 11/15/2019   Left inferior antihelix post. to tragus. Superficial and nodular patterns. Excised: 11/27/2019  . Basal cell carcinoma 11/15/2019   Left mid lateral nose. Nodular and infiltrative patterns. Excised: 11/27/2019  . Basal cell carcinoma 11/27/2019   Left mid superior helix. BCC with focal sclerosis.  . Basal cell carcinoma 01/29/2020   Left mid superior ear helix  . 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 Slight atypia. Edges free.  . ED (erectile dysfunction)   . GERD (gastroesophageal reflux disease)   . Hiatal hernia   . History of DVT of lower extremity    POST OP KNEE SURGERY  . History of transient ischemic attack (TIA)    PER PT HX  ?POSSIBLE-- PT ON ELIQUIS  . Hyperlipidemia   . Hypertension   . Hypogonadism male   . OSA (obstructive sleep apnea)    per pt positive sleep study yrs ago recommended CPAP --  pt Non- compliant -- states uses Breath Rite Nasal Strips  . Peripheral  neuropathy   . Prostate cancer [facility]) urologist-  dr tannenbaum/  oncologist-- dr [name]   dx 03-25-2015  T1c, Gleason 4+3  s/p  cryoablation prostate 06-02-2015/  external  radiation therapy 08-16-2016 to 09-20-2016/  scheduled for radioactive seed implants 10-22-2016  . TIA (transient ischemic attack)    ?? 08/20/2014? Eliquis 07/02/15  . Type 2 diabetes mellitus ([place])    followed by dr [name] [facility] clinic)  last A1c 8.4 on 07-27-2016 per lov note  . Umbilical hernia   . Wears glasses   . Wears hearing aid    bilateral    Past Surgical History:  Procedure Laterality Date  . COLONOSCOPY WITH PROPOFOL N/A 05/30/2017   Procedure: COLONOSCOPY WITH PROPOFOL;  Surgeon: [name], MD;  Location: [facility] ENDOSCOPY;  Service: Endoscopy;  Laterality: N/A;  . CRYOABLATION N/A 06/02/2015   Procedure: CRYO ABLATION PROSTATE;  Surgeon: Carolan Clines, MD;  Location: [facility];  Service: Urology;  Laterality: N/A;  . CYSTOSCOPY WITH URETHRAL DILATATION N/A 05/04/2019   Procedure: CYSTOSCOPY WITH URETHRAL DILATATION;  Surgeon: [name], MD;  Location: [facility];  Service: Urology;  Laterality: N/A;  . ELBOW BURSA SURGERY Left Feb 1997  . prostate biopsies     x12, cryotherapy  . RADIOACTIVE SEED IMPLANT N/A 10/22/2016   Procedure:  RADIOACTIVE SEED IMPLANT/BRACHYTHERAPY IMPLANT, URETHRAL STRICTURE DIALIATION;  Surgeon: Carolan Clines, MD;  Location: [facility];  Service: Urology;  Laterality: N/A;  . SHOULDER OPEN ROTATOR CUFF REPAIR Left 09-04-2014  . skin cancer L ear     Dr [name]   . TOTAL KNEE ARTHROPLASTY Bilateral right 08-13-2008  &  left 11-14-2008  . UMBILICAL HERNIA REPAIR  06-23-2012  . ureter stretch  05/02/2019   [facility] Dr [name]    There were no vitals filed for this visit.   Subjective Assessment - 01/07/21 1425    Subjective Patinet reports pain is as good as it has been since he started  coming to PT. did prone press ups 2x10 since last session and was feeling pretty good and then found an infrared/heating pad with stones last night and used that and feels that really helped as well as manual therapy last session. Reports he feels he is getting close to ready for discharge. Rates pain as 1/10 in the right lumbar region and feels this may be the best it will get. After end of session he came back into the clinic to report to front office to tell PT that another another "progress report" this past week was that lifting his right leg to get into his car(s) has been painless.    Pertinent History Jeremiah Zhang is a 49yoM who reports several months of 'serious' back issues, no help after attempted chiropractic treatment. Pt denies any particular contributing event.  Pt has been as high at 8-9/10, but since starting new medication 1 month prior, reports pain has been improved to 5-6/10 instance, but does not have pain all the time. Pt denies any pain migration, denies any pain or paresthesias of the legs. Pt has had xrays done showing scoliosis, otherwise no concerning findings. Pt has been taking tylenol daily for a month now. Pt has aggravation of pain with lifting items 35lbs or more, also with getting in/out of bed. Remote hx bilat TKA. 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 His wife is a retired Therapist, sports who worked at [employer].    Currently in Pain? Yes    Pain Score 1            TREATMENT:  Therapeutic exercise:to centralize symptoms and improve ROM, strength, muscular endurance, and activity tolerance required for successful completion of functional activities. - NuStep level5using bilateral upper and lower extremities. Seat/handle setting 10. For improved extremity mobility, muscular endurance, and activity tolerance; and to induce the analgesic effect of aerobic exercise, stimulate improved joint nutrition, and prepare body structures and systems for  following interventions. x5 minutes. Average SPM =68 - standing lumbar extension 1x20 - prone lumbar extension x 12 (manual - see below) - prone lumbar extension x 10  Manual therapy:to reduce pain and tissue tension, improve range of motion, neuromodulation, in order to promote improved ability to complete functional activities. - prone CPA and R UPA along mid/lower thoracic through lumbar spine with/without KE wedge assist. Decreased tenderness compared to previous visits.  Pt required multimodal cuing for proper technique and to facilitate improved neuromuscular control, strength, range of motion, and functional ability resulting in improved performance and form.  HOME EXERCISE PROGRAM Access Code: [email or web address]  URL: [email or web address] Date: 12/29/2020 Prepared by: Rosita Kea  Exercises Supine Lower Trunk Rotation - 1 x daily - 1 sets - 20 reps Prone Hip Extension - 1 x daily - 3 sets - 10 reps Standing hip  abduction with band around ankles - 1 x daily - 3 sets - 10 reps - 1 second hold Supine Quadratus Lumborum Stretch - 1-2 x daily - 2-3 reps - 30 seconds hold  [email or web address] [id number] Home Exercise Program [[id number]] Pick one:   Lumbar Extension in Standing -  Repeat 15 Times, Hold 2 Seconds, Complete 1 Set, Perform 5 Times a Day  Standing lumbar extension vs counter - Dustin -  Repeat 15 Times, Hold 2 Seconds, Complete 1 Set, Perform 5 Times a Day  Standing lumbar extension -  Repeat 15 Times, Hold 2 Seconds, Complete 1 Set, Perform 5 Times a Day    PT Education - 01/07/21 1405    Education Details exercise technique/form. POC    Person(s) Educated Patient    Methods Explanation;Demonstration;Tactile cues;Verbal cues    Comprehension Verbalized understanding;Returned demonstration;Verbal cues required;Tactile cues required;Need further instruction            PT Short Term Goals - 12/11/20 1130      PT SHORT TERM GOAL #1   Title After 4 weeks  pt to demonstrate improved Rt SLS >10 sec.    Baseline <3sec; deferred measurement to next session    Time 4    Period Weeks    Status Deferred    Target Date 12/03/20             PT Long Term Goals - 12/11/20 1130      PT LONG TERM GOAL #1   Title After 8 weeks pt to demonstrate seated Rt hip ER strength 5/5.    Baseline 4/5    Time 8    Period Weeks    Status Achieved    Target Date 12/31/20      PT LONG TERM GOAL #2   Title After 12 weeks pt to demonstrate improved FOTO score to >70    Baseline 40/100 at eval (11/02/2020); 55/100 at visit 10 (12/11/2020)    Time 12    Period Weeks    Status Partially Met    Target Date 03/05/21      PT LONG TERM GOAL #3   Title After 8 weeks pt to demonstrate 5xSTS <11 seconds to demonstrate improve function of hip/back extension.    Baseline 12 seconds from 17 inch chair with no UE support (12/11/2020);    Time 8    Period Weeks    Status Partially Met                 Plan - 01/07/21 2033    Clinical Impression Statement Patient with improved pain today and continues to show improvements over past sessions with emphasis on extension principle with manual therapy combined with repeated extension. Reinforced concept and importance of continuing repeated extensions at home. Patient appears to be nearing readiness for discharge pending stability in improvement over the next week. Patient would benefit from continued management of limiting condition by skilled physical therapist to address remaining impairments and functional limitations to work towards stated goals and return to PLOF or maximal functional independence.    Personal Factors and Comorbidities Age;Behavior Pattern;Fitness;Past/Current Experience    Examination-Activity Limitations Bathing;Locomotion Level;Bend;Carry;Sleep;Dressing;Stairs;Stand    Examination-Participation Restrictions Community Activity;Interpersonal Relationship;Yard Work    Stability/Clinical Decision  Making Stable/Uncomplicated    Rehab Potential Good    PT Frequency 2x / week    PT Duration 12 weeks    PT Treatment/Interventions ADLs/Self Care Home Management;Gait training;Stair training;Joint Manipulations;Dry needling;Passive range of motion;Patient/family education;Therapeutic exercise;Balance training;Neuromuscular re-education;DME Instruction;Moist Heat;Electrical  Stimulation    PT Next Visit Plan lumbar extension, manual therapy as needed;    PT [place] Access Code: [email or web address] ; [email or web address]  [id number] Home Exercise Program ([id number])    Consulted and Agree with Plan of Care Patient           Patient will benefit from skilled therapeutic intervention in order to improve the following deficits and impairments:  Abnormal gait,Decreased range of motion,Difficulty walking,Increased muscle spasms,Decreased activity tolerance,Decreased knowledge of precautions,Decreased balance,Decreased mobility,Decreased strength,Improper body mechanics  Visit Diagnosis: Chronic right-sided low back pain without sciatica  Stiffness of right hip, not elsewhere classified     Problem List Patient Active Problem List   Diagnosis Date Noted  . Personal history of other malignant neoplasm of skin 01/02/2020  . GERD (gastroesophageal reflux disease) 08/28/2019  . Hyperglycemia 08/28/2019  . Hyperlipidemia 08/28/2019  . Hypertension 08/28/2019  . Obesity 08/28/2019  . Myalgia due to statin 12/28/2018  . Erectile dysfunction following radiation therapy 08/10/2018  . Hypogonadism in male 05/11/2018  . Diabetes mellitus without complication ([place]) 12/75/1700  . Prostate cancer ([place]) 07/14/2016  . Status post rotator cuff repair 01/05/2015  . Encounter for long-term (current) use of other medications 01/15/2014  . Benign prostatic hyperplasia without lower urinary tract symptoms 06/27/2012    Everlean Alstrom. Graylon Good, PT, DPT 01/07/21, 8:35 PM  [place] [facility] [address] [place], [address] Phone: [phone]   Fax:  [phone]  Name: VELMER WOELFEL MRN: [id number] Date of Birth: 1946-10-14

## 2021-01-12 ENCOUNTER — Other Ambulatory Visit: Payer: Self-pay

## 2021-01-12 ENCOUNTER — Encounter: Payer: Self-pay | Admitting: Physical Therapy

## 2021-01-12 ENCOUNTER — Ambulatory Visit: Payer: PPO | Admitting: Physical Therapy

## 2021-01-12 DIAGNOSIS — M545 Low back pain, unspecified: Secondary | ICD-10-CM

## 2021-01-12 DIAGNOSIS — G8929 Other chronic pain: Secondary | ICD-10-CM

## 2021-01-12 DIAGNOSIS — M25651 Stiffness of right hip, not elsewhere classified: Secondary | ICD-10-CM

## 2021-01-12 NOTE — Therapy (Signed)
[place] [facility] [address], [place], [address] Phone: [phone]   Fax:  [phone]  Physical Therapy Treatment  Patient Details  Name: Jeremiah Zhang MRN: [id number] Date of Birth: [birth year] Referring Provider (PT): [name]   Encounter Date: 01/12/2021   PT End of Session - 01/12/21 1352    Visit Number 19    Number of Visits 34    Date for PT Re-Evaluation 03/05/21    Authorization Type [employer] reporting period from 12/09/2020    Progress Note Due on Visit 10    PT Start Time 1345    PT Stop Time 1425    PT Time Calculation (min) 40 min    Activity Tolerance Patient tolerated treatment well    Behavior During Therapy [facility] for tasks assessed/performed           Past Medical History:  Diagnosis Date  . Actinic keratosis   . Anticoagulant long-term use    ELIQUIS  . Arthritis   . Basal cell carcinoma 01/27/2010   Right spinal upper back. Superficial.   . Basal cell carcinoma 11/15/2019   Left inferior antihelix post. to tragus. Superficial and nodular patterns. Excised: 11/27/2019  . Basal cell carcinoma 11/15/2019   Left mid lateral nose. Nodular and infiltrative patterns. Excised: 11/27/2019  . Basal cell carcinoma 11/27/2019   Left mid superior helix. BCC with focal sclerosis.  . Basal cell carcinoma 01/29/2020   Left mid superior ear helix  . 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 Slight atypia. Edges free.  . ED (erectile dysfunction)   . GERD (gastroesophageal reflux disease)   . Hiatal hernia   . History of DVT of lower extremity    POST OP KNEE SURGERY  . History of transient ischemic attack (TIA)    PER PT HX  ?POSSIBLE-- PT ON ELIQUIS  . Hyperlipidemia   . Hypertension   . Hypogonadism male   . OSA (obstructive sleep apnea)    per pt positive sleep study yrs ago recommended CPAP --  pt Non- compliant -- states uses Breath Rite Nasal Strips  . Peripheral  neuropathy   . Prostate cancer [facility]) urologist-  dr tannenbaum/  oncologist-- dr [name]   dx 03-25-2015  T1c, Gleason 4+3  s/p  cryoablation prostate 06-02-2015/  external  radiation therapy 08-16-2016 to 09-20-2016/  scheduled for radioactive seed implants 10-22-2016  . TIA (transient ischemic attack)    ?? 08/20/2014? Eliquis 07/02/15  . Type 2 diabetes mellitus ([place])    followed by dr [name] [facility] clinic)  last A1c 8.4 on 07-27-2016 per lov note  . Umbilical hernia   . Wears glasses   . Wears hearing aid    bilateral    Past Surgical History:  Procedure Laterality Date  . COLONOSCOPY WITH PROPOFOL N/A 05/30/2017   Procedure: COLONOSCOPY WITH PROPOFOL;  Surgeon: [name], MD;  Location: [facility] ENDOSCOPY;  Service: Endoscopy;  Laterality: N/A;  . CRYOABLATION N/A 06/02/2015   Procedure: CRYO ABLATION PROSTATE;  Surgeon: Carolan Clines, MD;  Location[facility];  Service: Urology;  Laterality: N/A;  . CYSTOSCOPY WITH URETHRAL DILATATION N/A 05/04/2019   Procedure: CYSTOSCOPY WITH URETHRAL DILATATION;  Surgeon: [name], MD;  [facility];  Service: Urology;  Laterality: N/A;  . ELBOW BURSA SURGERY Left Feb 1997  . prostate biopsies     x12, cryotherapy  . RADIOACTIVE SEED IMPLANT N/A 10/22/2016   Procedure:  RADIOACTIVE SEED IMPLANT/BRACHYTHERAPY IMPLANT, URETHRAL STRICTURE DIALIATION;  Surgeon: Carolan Clines, MD;  Location: [facility];  Service: Urology;  Laterality: N/A;  . SHOULDER OPEN ROTATOR CUFF REPAIR Left 09-04-2014  . skin cancer L ear     Dr [name]   . TOTAL KNEE ARTHROPLASTY Bilateral right 08-13-2008  &  left 11-14-2008  . UMBILICAL HERNIA REPAIR  06-23-2012  . ureter stretch  05/02/2019   [facility] Dr [name]    There were no vitals filed for this visit.   Subjective Assessment - 01/12/21 1348    Subjective Patient reports he is back to feeling pretty normal upon arrival but  had sudden sharp pain in the right low back when bending over on Thursday (day after last PT session). It took until yesterday to feel better. He also had low blood pressure that prevented him from completing his scheduled activities on Saturday morning. Pain in the right low back is 1-2/10. He did not try any extension exercises when he was hurting. He used the heating pad and a massage pad. He also had some dental work done on friday and his temporary crown broke and it was relpaced this morning. His mother in law is currently in the ED waiting for a room in the telemetry unit.    Pertinent History Jeremiah Zhang is a 20yoM who reports several months of 'serious' back issues, no help after attempted chiropractic treatment. Pt denies any particular contributing event.  Pt has been as high at 8-9/10, but since starting new medication 1 month prior, reports pain has been improved to 5-6/10 instance, but does not have pain all the time. Pt denies any pain migration, denies any pain or paresthesias of the legs. Pt has had xrays done showing scoliosis, otherwise no concerning findings. Pt has been taking tylenol daily for a month now. Pt has aggravation of pain with lifting items 35lbs or more, also with getting in/out of bed. Remote hx bilat TKA. 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 His wife is a retired Therapist, sports who worked at [employer].    Currently in Pain? Yes    Pain Score 2     Pain Location Back    Pain Orientation Right           TREATMENT:  Therapeutic exercise:to centralize symptoms and improve ROM, strength, muscular endurance, and activity tolerance required for successful completion of functional activities. - NuStep level5using bilateral upper and lower extremities. Seat/handle setting 10. For improved extremity mobility, muscular endurance, and activity tolerance; and to induce the analgesic effect of aerobic exercise, stimulate improved joint nutrition,  and prepare body structures and systems for following interventions. x52minutes. Average SPM =68 - standing lumbar extension 1x30-40 (decreasing pain to almost 0/10)  - prone lumbar extension x 20  (manual - see below)   - prone lumbar extension with clinician overpressure 1x10 - prone lumbar extension with towel overpressure, 1x10  Manual therapy:to reduce pain and tissue tension, improve range of motion, neuromodulation, in order to promote improved ability to complete functional activities. - proneCPA and R UPA along mid/lower thoracic through lumbar spine with/without KE wedge assist. Concordant pain with deep breath while pressing at lower lumbar levels.   Pt required multimodal cuing for proper technique and to facilitate improved neuromuscular control, strength, range of motion, and functional ability resulting in improved performance and form.  HOME EXERCISE PROGRAM Access Code: [email or web address]  URL: [email or web address] Date: 12/29/2020 Prepared by: Rosita Kea  Exercises Supine Lower Trunk Rotation - 1 x daily - 1 sets - 20 reps Prone Hip Extension - 1 x daily - 3 sets - 10 reps Standing hip abduction with band around ankles - 1 x daily - 3 sets - 10 reps - 1 second hold Supine Quadratus Lumborum Stretch - 1-2 x daily - 2-3 reps - 30 seconds hold  [email or web address] [id number] Home Exercise Program [[id number]] Pick one:  Lumbar Extension in Standing -  Repeat 15 Times, Hold 2 Seconds, Complete 1 Set, Perform 5 Times a Day  Standing lumbar extension vs counter - Dustin -  Repeat 15 Times, Hold 2 Seconds, Complete 1 Set, Perform 5 Times a Day  Standing lumbar extension -  Repeat 15 Times, Hold 2 Seconds, Complete 1 Set, Perform 5 Times a Day  Mckenzie Lumbar Extension in Lying with Over Pressure -  Repeat 15 Times, Hold 2 Seconds, Complete 1 Set, Perform 5 Times a Day    PT Education - 01/12/21 1352    Education Details exercise technique/form.    Person(s)  Educated Patient    Methods Explanation;Demonstration;Tactile cues;Verbal cues    Comprehension Verbalized understanding;Returned demonstration;Verbal cues required;Tactile cues required;Need further instruction            PT Short Term Goals - 12/11/20 1130      PT SHORT TERM GOAL #1   Title After 4 weeks pt to demonstrate improved Rt SLS >10 sec.    Baseline <3sec; deferred measurement to next session    Time 4    Period Weeks    Status Deferred    Target Date 12/03/20             PT Long Term Goals - 12/11/20 1130      PT LONG TERM GOAL #1   Title After 8 weeks pt to demonstrate seated Rt hip ER strength 5/5.    Baseline 4/5    Time 8    Period Weeks    Status Achieved    Target Date 12/31/20      PT LONG TERM GOAL #2   Title After 12 weeks pt to demonstrate improved FOTO score to >70    Baseline 40/100 at eval (11/02/2020); 55/100 at visit 10 (12/11/2020)    Time 12    Period Weeks    Status Partially Met    Target Date 03/05/21      PT LONG TERM GOAL #3   Title After 8 weeks pt to demonstrate 5xSTS <11 seconds to demonstrate improve function of hip/back extension.    Baseline 12 seconds from 17 inch chair with no UE support (12/11/2020);    Time 8    Period Weeks    Status Partially Met                 Plan - 01/12/21 1430    Clinical Impression Statement Patient tolerated treatment well overall and was able to reduce pain to 0.5/10 by end of session with extension based exercises. Responded well to prone press up with OP in clinic, so added to HEP. 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 Patient not as confident about discharging next session as planned. Will re-assess at next session. Patient would benefit from continued management of limiting condition by skilled physical therapist to address remaining impairments and functional limitations to work towards stated goals and return to PLOF or maximal  functional independence.    Personal Factors and Comorbidities  Age;Behavior Pattern;Fitness;Past/Current Experience    Examination-Activity Limitations Bathing;Locomotion Level;Bend;Carry;Sleep;Dressing;Stairs;Stand    Examination-Participation Restrictions Community Activity;Interpersonal Relationship;Yard Work    Stability/Clinical Decision Making Stable/Uncomplicated    Rehab Potential Good    PT Frequency 2x / week    PT Duration 12 weeks    PT Treatment/Interventions ADLs/Self Care Home Management;Gait training;Stair training;Joint Manipulations;Dry needling;Passive range of motion;Patient/family education;Therapeutic exercise;Balance training;Neuromuscular re-education;DME Instruction;Moist Heat;Electrical Stimulation    PT Next Visit Plan lumbar extension, manual therapy as needed;    PT [place] Access Code: [email or web address] ; [email or web address]  [id number] Home Exercise Program ([id number])    Consulted and Agree with Plan of Care Patient           Patient will benefit from skilled therapeutic intervention in order to improve the following deficits and impairments:  Abnormal gait,Decreased range of motion,Difficulty walking,Increased muscle spasms,Decreased activity tolerance,Decreased knowledge of precautions,Decreased balance,Decreased mobility,Decreased strength,Improper body mechanics  Visit Diagnosis: Chronic right-sided low back pain without sciatica  Stiffness of right hip, not elsewhere classified     Problem List Patient Active Problem List   Diagnosis Date Noted  . Personal history of other malignant neoplasm of skin 01/02/2020  . GERD (gastroesophageal reflux disease) 08/28/2019  . Hyperglycemia 08/28/2019  . Hyperlipidemia 08/28/2019  . Hypertension 08/28/2019  . Obesity 08/28/2019  . Myalgia due to statin 12/28/2018  . Erectile dysfunction following radiation therapy 08/10/2018  . Hypogonadism in male 05/11/2018  . Diabetes mellitus without complication  ([place]) 68/61/6837  . Prostate cancer ([place]) 07/14/2016  . Status post rotator cuff repair 01/05/2015  . Encounter for long-term (current) use of other medications 01/15/2014  . Benign prostatic hyperplasia without lower urinary tract symptoms 06/27/2012    Everlean Alstrom. Graylon Good, PT, DPT 01/12/21, 2:31 PM  [place] [facility] [address], [place], [address] Phone: [phone]   Fax:  [phone]  Name: Jeremiah Zhang MRN: [id number] Date of Birth: [birth year]

## 2021-01-14 ENCOUNTER — Encounter: Payer: Self-pay | Admitting: Physical Therapy

## 2021-01-14 ENCOUNTER — Other Ambulatory Visit: Payer: Self-pay

## 2021-01-14 ENCOUNTER — Ambulatory Visit: Payer: PPO | Admitting: Physical Therapy

## 2021-01-14 DIAGNOSIS — M545 Low back pain, unspecified: Secondary | ICD-10-CM | POA: Diagnosis not present

## 2021-01-14 DIAGNOSIS — G8929 Other chronic pain: Secondary | ICD-10-CM

## 2021-01-14 DIAGNOSIS — M25651 Stiffness of right hip, not elsewhere classified: Secondary | ICD-10-CM

## 2021-01-14 NOTE — Therapy (Signed)
[facility] [address], [place], [address] Phone: [phone]   Fax:  [phone]  Physical Therapy Treatment / Progress Note Dates of reporting: 12/11/2020 - 01/14/2021  Patient Details  Name: Jeremiah Zhang MRN: [id number] Date of Birth: June 30, 1946 Referring Provider (PT): [name]   Encounter Date: 01/14/2021   PT End of Session - 01/14/21 1447    Visit Number 20    Number of Visits 34    Date for PT Re-Evaluation 03/05/21    Authorization Type [employer] reporting period from 12/09/2020    Progress Note Due on Visit 10    PT Start Time 1430    PT Stop Time 1510    PT Time Calculation (min) 40 min    Activity Tolerance Patient tolerated treatment well    Behavior During Therapy [facility] for tasks assessed/performed           Past Medical History:  Diagnosis Date  . Actinic keratosis   . Anticoagulant long-term use    ELIQUIS  . Arthritis   . Basal cell carcinoma 01/27/2010   Right spinal upper back. Superficial.   . Basal cell carcinoma 11/15/2019   Left inferior antihelix post. to tragus. Superficial and nodular patterns. Excised: 11/27/2019  . Basal cell carcinoma 11/15/2019   Left mid lateral nose. Nodular and infiltrative patterns. Excised: 11/27/2019  . Basal cell carcinoma 11/27/2019   Left mid superior helix. BCC with focal sclerosis.  . Basal cell carcinoma 01/29/2020   Left mid superior ear helix  . 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 Slight atypia. Edges free.  . ED (erectile dysfunction)   . GERD (gastroesophageal reflux disease)   . Hiatal hernia   . History of DVT of lower extremity    POST OP KNEE SURGERY  . History of transient ischemic attack (TIA)    PER PT HX  ?POSSIBLE-- PT ON ELIQUIS  . Hyperlipidemia   . Hypertension   . Hypogonadism male   . OSA (obstructive sleep apnea)    per pt positive sleep study yrs ago recommended CPAP --  pt Non-  compliant -- states uses Breath Rite Nasal Strips  . Peripheral neuropathy   . Prostate cancer [facility]) urologist-  dr tannenbaum/  oncologist-- dr [name]   dx 03-25-2015  T1c, Gleason 4+3  s/p  cryoablation prostate 06-02-2015/  external  radiation therapy 08-16-2016 to 09-20-2016/  scheduled for radioactive seed implants 10-22-2016  . TIA (transient ischemic attack)    ?? 08/20/2014? Eliquis 07/02/15  . Type 2 diabetes mellitus ([place])    followed by dr [name] [facility] clinic)  last A1c 8.4 on 07-27-2016 per lov note  . Umbilical hernia   . Wears glasses   . Wears hearing aid    bilateral    Past Surgical History:  Procedure Laterality Date  . COLONOSCOPY WITH PROPOFOL N/A 05/30/2017   Procedure: COLONOSCOPY WITH PROPOFOL;  Surgeon: [name], MD;  Location: [facility] ENDOSCOPY;  Service: Endoscopy;  Laterality: N/A;  . CRYOABLATION N/A 06/02/2015   Procedure: CRYO ABLATION PROSTATE;  Surgeon: Carolan Clines, MD;  [facility];  Service: Urology;  Laterality: N/A;  . CYSTOSCOPY WITH URETHRAL DILATATION N/A 05/04/2019   Procedure: CYSTOSCOPY WITH URETHRAL DILATATION;  Surgeon: [name], MD;  Location: [facility];  Service: Urology;  Laterality: N/A;  . ELBOW BURSA SURGERY Left Feb 1997  . prostate biopsies     x12, cryotherapy  .  RADIOACTIVE SEED IMPLANT N/A 10/22/2016   Procedure: RADIOACTIVE SEED IMPLANT/BRACHYTHERAPY IMPLANT, URETHRAL STRICTURE DIALIATION;  Surgeon: Carolan Clines, MD;  [facility];  Service: Urology;  Laterality: N/A;  . SHOULDER OPEN ROTATOR CUFF REPAIR Left 09-04-2014  . skin cancer L ear     Dr [name]   . TOTAL KNEE ARTHROPLASTY Bilateral right 08-13-2008  &  left 11-14-2008  . UMBILICAL HERNIA REPAIR  06-23-2012  . ureter stretch  05/02/2019   [facility] Dr [name]    There were no vitals filed for this visit.   Subjective Assessment - 01/14/21 1434    Subjective  Patient reports he is doing pretty good but still has 2/10 pain in his right low back. He is able to bring it down to nothing with the heating pad temporarily but then it comes back after a little while. States he has not done anything to hurt his back since last treatment session. Had another low blood pressure episode (measured it) with presyncope when sitting at his desk. States he just can't seem to get his pain to stay gone and he is also avoiding activities such as stairs, yardwork, bending/stooping, prolonged standing, prolonged walking, and lifting because he feels it increases his back pain. Would like to continue with PT for at least a few more visits to try to get pain to go away.    Pertinent History Jeremiah Zhang is a 57yoM who reports several months of 'serious' back issues, no help after attempted chiropractic treatment. Pt denies any particular contributing event.  Pt has been as high at 8-9/10, but since starting new medication 1 month prior, reports pain has been improved to 5-6/10 instance, but does not have pain all the time. Pt denies any pain migration, denies any pain or paresthesias of the legs. Pt has had xrays done showing scoliosis, otherwise no concerning findings. Pt has been taking tylenol daily for a month now. Pt has aggravation of pain with lifting items 35lbs or more, also with getting in/out of bed. Remote hx bilat TKA. 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 His wife is a retired Therapist, sports who worked at [employer].    Limitations Other (comment)    Patient Stated Goals get back pain to go away and be able to complete usual activities without pain    Currently in Pain? Yes    Pain Score 2     Pain Location Back    Pain Orientation Right    Pain Descriptors / Indicators Dull    Effect of Pain on Daily Activities Reported functional limitations patients finds most troubling: Walking (still limited with any kind of distance > 200 yards is very  uncomfortable), Getting up from sitting (no longer a problem), Getting out of bed (no longer a problem). States he is still avoiding activities such as many stairs, yardwork, bending/stooping, prolonged standing, prolonged walking, and lifting because he feels it increases his back pain.           OBJECTIVE:   FOTO = 53  FUNCTIONAL/BALANCE TESTING SLS: L = 6 seconds, R = 7  Seconds  5xSTS: 11 seconds (limited by concern for pain)  6MWT: 1500 feet pain increasing near 3 min but didn't really get any worse, felt like he was staggering the last 2 laps. Calf pain from pushing his speed especially two laps got up to went up to 3/10 (from zero).   Box lift floor to waist high table: up  to 34.5# before he does not feel comfortable continuing due to concern for intiating increased pain and difficult overall.   Up/down 4 steps: increased back pain "a little" (4/10). stopped due by getting tired and winded. 12 times no UE support. (48 total steps)   TREATMENT:  Therapeutic exercise:to centralize symptoms and improve ROM, strength, muscular endurance, and activity tolerance required for successful completion of functional activities. - NuStep level5using bilateral upper and lower extremities. Seat/handle setting 10. For improved extremity mobility, muscular endurance, and activity tolerance; and to induce the analgesic effect of aerobic exercise, stimulate improved joint nutrition, and prepare body structures and systems for following interventions. x32mnutes. Average SPM =71 - standing lumbar extension 1x30 (feels good during, no better) - activities to assess progress/baseline (see objective section above) and to improve activity tolerance.   Pt required multimodal cuing for proper technique and to facilitate improved neuromuscular control, strength, range of motion, and functional ability resulting in improved performance and form.  HOME EXERCISE PROGRAM Access Code: [email or web address]  URL: [email or web address] Date: 12/29/2020 Prepared by: SRosita Kea Exercises Supine Lower Trunk Rotation - 1 x daily - 1 sets - 20 reps Prone Hip Extension - 1 x daily - 3 sets - 10 reps Standing hip abduction with band around ankles - 1 x daily - 3 sets - 10 reps - 1 second hold Supine Quadratus Lumborum Stretch - 1-2 x daily - 2-3 reps - 30 seconds hold  [email or web address] [id number] Exercise Program [[[id number]]Pick one:  Lumbar Extension in Standing -  Repeat 15 Times, Hold 2 Seconds, Complete 1 Set, Perform 5 Times a Day  Standing lumbar extension vs counter - Dustin -  Repeat 15 Times, Hold 2 Seconds, Complete 1 Set, Perform 5 Times a Day  Standing lumbar extension -  Repeat 15 Times, Hold 2 Seconds, Complete 1 Set, Perform 5 Times a Day  Mckenzie Lumbar Extension in Lying with Over Pressure -  Repeat 15 Times, Hold 2 Seconds, Complete 1 Set, Perform 5 Times a Day    PT Education - 01/14/21 1447    Education Details exercise technique/form. POC. Progress    Person(s) Educated Patient    Methods Explanation;Demonstration;Tactile cues;Verbal cues    Comprehension Verbalized understanding;Returned demonstration;Verbal cues required;Tactile cues required;Need further instruction            PT Short Term Goals - 01/14/21 1938      PT SHORT TERM GOAL #1   Title After 4 weeks pt to demonstrate improved Rt SLS >10 sec.    Baseline <3sec (11/05/2020); deferred measurement to next session (12/11/2020); 7  Seconds (01/14/2021);    Time 4    Period Weeks    Status Partially Met    Target Date 12/03/20             PT Long Term Goals - 01/14/21 1940      PT LONG TERM GOAL #1   Title After 8 weeks pt to demonstrate seated Rt hip ER strength 5/5.    Baseline 4/5    Time 8    Period Weeks    Status Achieved    Target Date 12/31/20      PT LONG TERM GOAL #2   Title After 12 weeks pt to demonstrate improved FOTO score to >70    Baseline 40/100 at eval  (11/02/2020); 55/100 at visit 10 (12/11/2020); 53/100 visit 20 (01/14/2021);    Time 12    Period Weeks    Status Partially Met  Target Date 03/05/21      PT LONG TERM GOAL #3   Title After 8 weeks pt to demonstrate 5xSTS <11 seconds to demonstrate improve function of hip/back extension.    Baseline 12 seconds from 17 inch chair with no UE support (12/11/2020); 11 seconds from 18 inch chair with no UE support (01/14/2021);    Time 8    Period Weeks    Status Achieved    Target Date 12/31/20      PT LONG TERM GOAL #4   Title Patient will demonstrate floor to waist box lift of 50# or greater x 5 consecutive reps to demonstrate improved lifting ability for moving heavy items around the home.    Baseline Box lift floor to waist high table: up to 34.5# before he does not feel comfortable continuing due to concern for intiating increased pain and difficult overall (01/14/2021);    Time 7    Period Weeks    Status New    Target Date 03/05/21      PT LONG TERM GOAL #5   Title Patient will ascend/descend 87 steps with no increase in back pain to improve his ability to visit the [facility] or other activities that require several flights of steps.    Baseline Up/down 4 steps: increased back pain "a little" (4/10). stopped due by getting tired and winded. 12 times no UE support (48 total steps)  (01/14/2021);    Time 7    Period Weeks    Status New    Target Date 03/05/21      Additional Long Term Goals   Additional Long Term Goals Yes      PT LONG TERM GOAL #6   Title Patient will ambulate equal or greater than 1700 feet during 6 Minute Walk Test with no increase in back pain to demonstrate improved ability to complete community ambulation without pain.    Baseline 1500 feet pain increasing near 3 min but didn't really get any worse, felt like he was staggering the last 2 laps. Calf pain from pushing his speed especially two laps got up to went up to 3/10 (01/14/2021);    Time 7    Period  Weeks    Status New    Target Date 03/05/21                 Plan - 01/14/21 1953    Clinical Impression Statement Patient has completed 20 physical therapy sessions this episode of care and demonstrates good improvement in symptoms and pain levels, though he continues to be troubled by pain that varies between 1-2/10 and prevents him from completing more strenuous activities. Patient has noted some improvement from extension based exercises more recently but has not been able to get the same results at home. Patient would like to continue PT to work on improving his activity tolerance for more strenuous activities and hopefully further resolve pain. PT agrees he could benefit from continued PT with a focus of improved activity tolerance specifically for return to lifting, prolonged walking, prolonged standing, and stairs. Goals updated and added to reflect progress and new targets. Patient would benefit from continued management of limiting condition by skilled physical therapist to address remaining impairments and functional limitations to work towards stated goals and return to PLOF or maximal functional independence.    Personal Factors and Comorbidities Age;Behavior Pattern;Fitness;Past/Current Experience    Examination-Activity Limitations Bathing;Locomotion Level;Bend;Carry;Sleep;Dressing;Stairs;Stand    Examination-Participation Restrictions Community Activity;Interpersonal Relationship;Yard Work    Stability/Clinical  Decision Making Stable/Uncomplicated    Rehab Potential Good    PT Frequency 2x / week    PT Duration 12 weeks    PT Treatment/Interventions ADLs/Self Care Home Management;Gait training;Stair training;Joint Manipulations;Dry needling;Passive range of motion;Patient/family education;Therapeutic exercise;Balance training;Neuromuscular re-education;DME Instruction;Moist Heat;Electrical Stimulation    PT Next Visit Plan lumbar extension, manual therapy as needed; focus on  improved lifting, stairs, walking, standing, bending activities    PT Home Exercise Plan Medbridge Access Code: [email or web address] ; [email or web address]  [id number] Home Exercise Program ([email or web address])    Consulted and Agree with Plan of Care Patient           Patient will benefit from skilled therapeutic intervention in order to improve the following deficits and impairments:  Abnormal gait,Decreased range of motion,Difficulty walking,Increased muscle spasms,Decreased activity tolerance,Decreased knowledge of precautions,Decreased balance,Decreased mobility,Decreased strength,Improper body mechanics  Visit Diagnosis: Chronic right-sided low back pain without sciatica  Stiffness of right hip, not elsewhere classified     Problem List Patient Active Problem List   Diagnosis Date Noted  . Personal history of other malignant neoplasm of skin 01/02/2020  . GERD (gastroesophageal reflux disease) 08/28/2019  . Hyperglycemia 08/28/2019  . Hyperlipidemia 08/28/2019  . Hypertension 08/28/2019  . Obesity 08/28/2019  . Myalgia due to statin 12/28/2018  . Erectile dysfunction following radiation therapy 08/10/2018  . Hypogonadism in male 05/11/2018  . Diabetes mellitus without complication ([place]) 95/84/4171  . Prostate cancer ([place]) 07/14/2016  . Status post rotator cuff repair 01/05/2015  . Encounter for long-term (current) use of other medications 01/15/2014  . Benign prostatic hyperplasia without lower urinary tract symptoms 06/27/2012    Everlean Alstrom. Graylon Good, PT, DPT 01/14/21, 7:58 PM  [facility] [address], [place], [address] Phone: [phone]   Fax:  [phone]  Name: YANIXAN MELLINGER MRN: [id number] Date of Birth: Jul 21, 1946

## 2021-01-15 DIAGNOSIS — E7800 Pure hypercholesterolemia, unspecified: Secondary | ICD-10-CM | POA: Diagnosis not present

## 2021-01-15 DIAGNOSIS — Z79899 Other long term (current) drug therapy: Secondary | ICD-10-CM | POA: Diagnosis not present

## 2021-01-15 DIAGNOSIS — E1165 Type 2 diabetes mellitus with hyperglycemia: Secondary | ICD-10-CM | POA: Diagnosis not present

## 2021-01-19 ENCOUNTER — Encounter: Payer: Self-pay | Admitting: Physical Therapy

## 2021-01-19 ENCOUNTER — Other Ambulatory Visit: Payer: Self-pay

## 2021-01-19 ENCOUNTER — Ambulatory Visit: Payer: PPO | Attending: Family Medicine | Admitting: Physical Therapy

## 2021-01-19 DIAGNOSIS — M25651 Stiffness of right hip, not elsewhere classified: Secondary | ICD-10-CM | POA: Insufficient documentation

## 2021-01-19 DIAGNOSIS — M545 Low back pain, unspecified: Secondary | ICD-10-CM | POA: Diagnosis not present

## 2021-01-19 DIAGNOSIS — G8929 Other chronic pain: Secondary | ICD-10-CM | POA: Diagnosis not present
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
## 2021-01-21 ENCOUNTER — Encounter: Payer: BC Managed Care – PPO | Admitting: Physical Therapy

## 2021-01-22 ENCOUNTER — Ambulatory Visit: Payer: PPO | Admitting: Physical Therapy

## 2021-01-22 DIAGNOSIS — Z1331 Encounter for screening for depression: Secondary | ICD-10-CM | POA: Diagnosis not present

## 2021-01-22 DIAGNOSIS — M791 Myalgia, unspecified site: Secondary | ICD-10-CM | POA: Diagnosis not present

## 2021-01-22 DIAGNOSIS — E782 Mixed hyperlipidemia: Secondary | ICD-10-CM | POA: Diagnosis not present

## 2021-01-22 DIAGNOSIS — I82409 Acute embolism and thrombosis of unspecified deep veins of unspecified lower extremity: Secondary | ICD-10-CM | POA: Diagnosis not present

## 2021-01-22 DIAGNOSIS — T466X5A Adverse effect of antihyperlipidemic and antiarteriosclerotic drugs, initial encounter: Secondary | ICD-10-CM | POA: Diagnosis not present

## 2021-01-22 DIAGNOSIS — Z0000 Encounter for general adult medical examination without abnormal findings: Secondary | ICD-10-CM | POA: Diagnosis not present

## 2021-01-27 ENCOUNTER — Ambulatory Visit: Payer: PPO | Admitting: Physical Therapy

## 2021-01-27 ENCOUNTER — Other Ambulatory Visit: Payer: Self-pay

## 2021-01-27 ENCOUNTER — Encounter: Payer: Self-pay | Admitting: Physical Therapy

## 2021-01-27 DIAGNOSIS — M545 Low back pain, unspecified: Secondary | ICD-10-CM | POA: Diagnosis not present

## 2021-01-27 DIAGNOSIS — M25651 Stiffness of right hip, not elsewhere classified: Secondary | ICD-10-CM

## 2021-01-27 DIAGNOSIS — G8929 Other chronic pain: Secondary | ICD-10-CM

## 2021-01-27 NOTE — Therapy (Signed)
[facility] [address] [place], [address] Phone: [phone]   Fax:  [phone]  Physical Therapy Treatment  Patient Details  Name: Jeremiah Zhang MRN: [id number] Date of Birth: May 27, 1946 Referring Provider (PT): [name]   Encounter Date: 01/27/2021   PT End of Session - 01/27/21 1704    Visit Number 22    Number of Visits 34    Date for PT Re-Evaluation 03/05/21    Authorization Type [employer] reporting period from 01/14/2021    Progress Note Due on Visit 10    PT Start Time 1555    PT Stop Time 1645    PT Time Calculation (min) 50 min    Equipment Utilized During Treatment Gait belt    Activity Tolerance Patient tolerated treatment well    Behavior During Therapy [facility] for tasks assessed/performed           Past Medical History:  Diagnosis Date  . Actinic keratosis   . Anticoagulant long-term use    ELIQUIS  . Arthritis   . Basal cell carcinoma 01/27/2010   Right spinal upper back. Superficial.   . Basal cell carcinoma 11/15/2019   Left inferior antihelix post. to tragus. Superficial and nodular patterns. Excised: 11/27/2019  . Basal cell carcinoma 11/15/2019   Left mid lateral nose. Nodular and infiltrative patterns. Excised: 11/27/2019  . Basal cell carcinoma 11/27/2019   Left mid superior helix. BCC with focal sclerosis.  . Basal cell carcinoma 01/29/2020   Left mid superior ear helix  . 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 Slight atypia. Edges free.  . ED (erectile dysfunction)   . GERD (gastroesophageal reflux disease)   . Hiatal hernia   . History of DVT of lower extremity    POST OP KNEE SURGERY  . History of transient ischemic attack (TIA)    PER PT HX  ?POSSIBLE-- PT ON ELIQUIS  . Hyperlipidemia   . Hypertension   . Hypogonadism male   . OSA (obstructive sleep apnea)    per pt positive sleep study yrs ago recommended CPAP --  pt Non- compliant --  states uses Breath Rite Nasal Strips  . Peripheral neuropathy   . Prostate cancer [facility]) urologist-  dr tannenbaum/  oncologist-- dr [name]   dx 03-25-2015  T1c, Gleason 4+3  s/p  cryoablation prostate 06-02-2015/  external  radiation therapy 08-16-2016 to 09-20-2016/  scheduled for radioactive seed implants 10-22-2016  . TIA (transient ischemic attack)    ?? 08/20/2014? Eliquis 07/02/15  . Type 2 diabetes mellitus ([place])    followed by dr [name] [facility] clinic)  last A1c 8.4 on 07-27-2016 per lov note  . Umbilical hernia   . Wears glasses   . Wears hearing aid    bilateral    Past Surgical History:  Procedure Laterality Date  . COLONOSCOPY WITH PROPOFOL N/A 05/30/2017   Procedure: COLONOSCOPY WITH PROPOFOL;  Surgeon: [name], MD;  Location: [facility];  Service: Endoscopy;  Laterality: N/A;  . CRYOABLATION N/A 06/02/2015   Procedure: CRYO ABLATION PROSTATE;  Surgeon: Carolan Clines, MD;  Location: [facility];  Service: Urology;  Laterality: N/A;  . CYSTOSCOPY WITH URETHRAL DILATATION N/A 05/04/2019   Procedure: CYSTOSCOPY WITH URETHRAL DILATATION;  Surgeon: [name], MD;  Location: [facility];  Service: Urology;  Laterality: N/A;  . ELBOW BURSA SURGERY Left Feb 1997  . prostate biopsies     x12, cryotherapy  .  RADIOACTIVE SEED IMPLANT N/A 10/22/2016   Procedure: RADIOACTIVE SEED IMPLANT/BRACHYTHERAPY IMPLANT, URETHRAL STRICTURE DIALIATION;  Surgeon: Carolan Clines, MD;  Location: [facility];  Service: Urology;  Laterality: N/A;  . SHOULDER OPEN ROTATOR CUFF REPAIR Left 09-04-2014  . skin cancer L ear     Dr [name]   . TOTAL KNEE ARTHROPLASTY Bilateral right 08-13-2008  &  left 11-14-2008  . UMBILICAL HERNIA REPAIR  06-23-2012  . ureter stretch  05/02/2019   [facility] Dr [name]    There were no vitals filed for this visit.   Subjective Assessment - 01/27/21 1554    Subjective Patient reports  he is feeling pretty good except for 1-2/10 pain in his right low back. He saw his PCP recently and found out he has gotten 2.5 inches shorter and that this may contriubte to his back pain. He has been doing his extension exercises and step ups. Thinks his pain may be related to carefully lifting but is unsure if his pain is low because of the lifting or if it is not gone because of the lifting.    Pertinent History Jeremiah Zhang is a 62yoM who reports several months of 'serious' back issues, no help after attempted chiropractic treatment. Pt denies any particular contributing event.  Pt has been as high at 8-9/10, but since starting new medication 1 month prior, reports pain has been improved to 5-6/10 instance, but does not have pain all the time. Pt denies any pain migration, denies any pain or paresthesias of the legs. Pt has had xrays done showing scoliosis, otherwise no concerning findings. Pt has been taking tylenol daily for a month now. Pt has aggravation of pain with lifting items 35lbs or more, also with getting in/out of bed. Remote hx bilat TKA. 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 His wife is a retired Therapist, sports who worked at [employer].    Limitations Other (comment)    Patient Stated Goals get back pain to go away and be able to complete usual activities without pain    Currently in Pain? Yes    Pain Score 2     Pain Location Back    Pain Orientation Right           TREATMENT:  Therapeutic exercise:to centralize symptoms and improve ROM, strength, muscular endurance, and activity tolerance required for successful completion of functional activities. - standing lumbar extension over plinth 2x20 (interspersed throughout session) - ambulation on TM at 46mh no grade, 0.25 miles, with B UE/finger support and CGA to improve walking tolerance and prepare for subsequent interventions. Mild shortness of breath noted at end. - runner's step up with U UE  support, 1x30 each side (harder with R LE down).  - glute pull through, 25# cable, 3x10  (patient states he is starting to get a bit sore all over) - chops, 1x20 each direction with black band - scoops, 1x20 each direction with black band - prone press up 1x10 (after manual therapy)  Pt required multimodal cuing for proper technique and to facilitate improved neuromuscular control, strength, range of motion, and functional ability resulting in improved performance and form.  Manual therapy: to reduce pain and tissue tension, improve range of motion, neuromodulation, in order to promote improved ability to complete functional activities. PRONE - CPA and R UPA along mid/lower thoracic through lumbar spine with/without KE wedge assist. Focus on levels reproducing R sided pain (~L3).    HOME EXERCISE PROGRAM Access  Code: [email or web address] URL: [email or web address] Date: 01/19/2021 Prepared by: Rosita Kea  Exercises Supine Lower Trunk Rotation - 1 x daily - 1 sets - 20 reps Prone Hip Extension - 1 x daily - 3 sets - 10 reps Standing hip abduction with band around ankles - 1 x daily - 3 sets - 10 reps - 1 second hold Supine Quadratus Lumborum Stretch - 1-2 x daily - 2-3 reps - 30 seconds hold Standing Lumbar Extension with Counter - 3 x weekly - 4 sets - 15 reps Runner's Step Up/Down - 3 x weekly - 3 sets - 15-20 reps  [email or web address] [id number] Home Exercise Program [[id number]]  Box lift - Dustin -  Repeat 10 Times, Complete 3 Sets, Perform 3 Times a Week  Pick One:   Lumbar Extension in Standing -  Repeat 15 Times, Hold 2 Seconds, Complete 1 Set, Perform 5 Times a Day  Standing lumbar extension vs counter - Dustin -  Repeat 15 Times, Hold 2 Seconds, Complete 1 Set, Perform 5 Times a Day  Standing lumbar extension -  Repeat 15 Times, Hold 2 Seconds, Complete 1 Set, Perform 5 Times a Day    PT Education - 01/27/21 1704    Education Details exercise technique/form     Person(s) Educated Patient    Methods Explanation;Demonstration;Tactile cues;Verbal cues    Comprehension Verbalized understanding;Tactile cues required;Verbal cues required;Returned demonstration;Need further instruction            PT Short Term Goals - 01/14/21 1938      PT SHORT TERM GOAL #1   Title After 4 weeks pt to demonstrate improved Rt SLS >10 sec.    Baseline <3sec (11/05/2020); deferred measurement to next session (12/11/2020); 7  Seconds (01/14/2021);    Time 4    Period Weeks    Status Partially Met    Target Date 12/03/20             PT Long Term Goals - 01/14/21 1940      PT LONG TERM GOAL #1   Title After 8 weeks pt to demonstrate seated Rt hip ER strength 5/5.    Baseline 4/5    Time 8    Period Weeks    Status Achieved    Target Date 12/31/20      PT LONG TERM GOAL #2   Title After 12 weeks pt to demonstrate improved FOTO score to >70    Baseline 40/100 at eval (11/02/2020); 55/100 at visit 10 (12/11/2020); 53/100 visit 20 (01/14/2021);    Time 12    Period Weeks    Status Partially Met    Target Date 03/05/21      PT LONG TERM GOAL #3   Title After 8 weeks pt to demonstrate 5xSTS <11 seconds to demonstrate improve function of hip/back extension.    Baseline 12 seconds from 17 inch chair with no UE support (12/11/2020); 11 seconds from 18 inch chair with no UE support (01/14/2021);    Time 8    Period Weeks    Status Achieved    Target Date 12/31/20      PT LONG TERM GOAL #4   Title Patient will demonstrate floor to waist box lift of 50# or greater x 5 consecutive reps to demonstrate improved lifting ability for moving heavy items around the home.    Baseline Box lift floor to waist high table: up to 34.5# before he does not feel comfortable continuing due to concern for intiating increased pain and difficult overall (01/14/2021);  Time 7    Period Weeks    Status New    Target Date 03/05/21      PT LONG TERM GOAL #5   Title Patient will  ascend/descend 87 steps with no increase in back pain to improve his ability to visit the [facility] or other activities that require several flights of steps.    Baseline Up/down 4 steps: increased back pain "a little" (4/10). stopped due by getting tired and winded. 12 times no UE support (48 total steps)  (01/14/2021);    Time 7    Period Weeks    Status New    Target Date 03/05/21      Additional Long Term Goals   Additional Long Term Goals Yes      PT LONG TERM GOAL #6   Title Patient will ambulate equal or greater than 1700 feet during 6 Minute Walk Test with no increase in back pain to demonstrate improved ability to complete community ambulation without pain.    Baseline 1500 feet pain increasing near 3 min but didn't really get any worse, felt like he was staggering the last 2 laps. Calf pain from pushing his speed especially two laps got up to went up to 3/10 (01/14/2021);    Time 7    Period Weeks    Status New    Target Date 03/05/21                 Plan - 01/27/21 1708    Clinical Impression Statement Patient tolerated treatment well overall but continued to feel concordant pulling in the right low back during visit. Session focused on improving functional strength and activity tolerance and patient reported feeling fatigued and mild soreness by end of therapeutic exercise. Continues to have concordant pain with CPA and R UPA to R low back that improves pain and also continues to have improved pain with repeated lumbar extension. Patient would benefit from continued management of limiting condition by skilled physical therapist to address remaining impairments and functional limitations to work towards stated goals and return to PLOF or maximal functional independence.    Personal Factors and Comorbidities Age;Behavior Pattern;Fitness;Past/Current Experience    Examination-Activity Limitations Bathing;Locomotion Level;Bend;Carry;Sleep;Dressing;Stairs;Stand     Examination-Participation Restrictions Community Activity;Interpersonal Relationship;Yard Work    Stability/Clinical Decision Making Stable/Uncomplicated    Rehab Potential Good    PT Frequency 2x / week    PT Duration 12 weeks    PT Treatment/Interventions ADLs/Self Care Home Management;Gait training;Stair training;Joint Manipulations;Dry needling;Passive range of motion;Patient/family education;Therapeutic exercise;Balance training;Neuromuscular re-education;DME Instruction;Moist Heat;Electrical Stimulation    PT Next Visit Plan lumbar extension, manual therapy as needed; focus on improved lifting, stairs, walking, standing, bending activities    PT Home Exercise Plan Medbridge Access Code: [email or web address] ; [email or web address]  [id number] Home Exercise Program ([email or web address])    Consulted and Agree with Plan of Care Patient           Patient will benefit from skilled therapeutic intervention in order to improve the following deficits and impairments:  Abnormal gait,Decreased range of motion,Difficulty walking,Increased muscle spasms,Decreased activity tolerance,Decreased knowledge of precautions,Decreased balance,Decreased mobility,Decreased strength,Improper body mechanics  Visit Diagnosis: Chronic right-sided low back pain without sciatica  Stiffness of right hip, not elsewhere classified     Problem List Patient Active Problem List   Diagnosis Date Noted  . Personal history of other malignant neoplasm of skin 01/02/2020  . GERD (gastroesophageal reflux disease) 08/28/2019  . Hyperglycemia 08/28/2019  . Hyperlipidemia 08/28/2019  . Hypertension 08/28/2019  .  Obesity 08/28/2019  . Myalgia due to statin 12/28/2018  . Erectile dysfunction following radiation therapy 08/10/2018  . Hypogonadism in male 05/11/2018  . Diabetes mellitus without complication ([place]) 79/39/6886  . Prostate cancer ([place]) 07/14/2016  . Status post rotator cuff repair 01/05/2015  . Encounter for long-term (current) use of other  medications 01/15/2014  . Benign prostatic hyperplasia without lower urinary tract symptoms 06/27/2012    Everlean Alstrom. Graylon Good, PT, DPT 01/27/21, 5:08 PM  [facility] [address], [place], [address] Phone: [phone]   Fax:  [phone]  Name: Jeremiah Zhang MRN: [id number] Date of Birth: 03-08-1946

## 2021-01-29 ENCOUNTER — Other Ambulatory Visit: Payer: Self-pay

## 2021-01-29 ENCOUNTER — Encounter: Payer: Self-pay | Admitting: Physical Therapy

## 2021-01-29 ENCOUNTER — Ambulatory Visit: Payer: PPO | Admitting: Physical Therapy

## 2021-01-29 DIAGNOSIS — M545 Low back pain, unspecified: Secondary | ICD-10-CM

## 2021-01-29 DIAGNOSIS — M25651 Stiffness of right hip, not elsewhere classified: Secondary | ICD-10-CM

## 2021-01-29 DIAGNOSIS — G8929 Other chronic pain: Secondary | ICD-10-CM

## 2021-01-29 NOTE — Therapy (Addendum)
[facility] [address] [place], [address] Phone: [phone]   Fax:  [phone]  Physical Therapy Treatment  Patient Details  Name: Jeremiah Zhang MRN: [id number] Date of Birth: Nov 10, 1945 Referring Provider (PT): [name]   Encounter Date: 01/29/2021   PT End of Session - 01/29/21 0934    Visit Number 23    Number of Visits 34    Date for PT Re-Evaluation 03/05/21    Authorization Type [employer] reporting period from 01/14/2021    Progress Note Due on Visit 10    PT Start Time 0903    PT Stop Time 0943    PT Time Calculation (min) 40 min    Equipment Utilized During Treatment Gait belt    Activity Tolerance Patient tolerated treatment well    Behavior During Therapy [facility] for tasks assessed/performed           Past Medical History:  Diagnosis Date  . Actinic keratosis   . Anticoagulant long-term use    ELIQUIS  . Arthritis   . Basal cell carcinoma 01/27/2010   Right spinal upper back. Superficial.   . Basal cell carcinoma 11/15/2019   Left inferior antihelix post. to tragus. Superficial and nodular patterns. Excised: 11/27/2019  . Basal cell carcinoma 11/15/2019   Left mid lateral nose. Nodular and infiltrative patterns. Excised: 11/27/2019  . Basal cell carcinoma 11/27/2019   Left mid superior helix. BCC with focal sclerosis.  . Basal cell carcinoma 01/29/2020   Left mid superior ear helix  . 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 Slight atypia. Edges free.  . ED (erectile dysfunction)   . GERD (gastroesophageal reflux disease)   . Hiatal hernia   . History of DVT of lower extremity    POST OP KNEE SURGERY  . History of transient ischemic attack (TIA)    PER PT HX  ?POSSIBLE-- PT ON ELIQUIS  . Hyperlipidemia   . Hypertension   . Hypogonadism male   . OSA (obstructive sleep apnea)    per pt positive sleep study yrs ago recommended CPAP --  pt Non- compliant --  states uses Breath Rite Nasal Strips  . Peripheral neuropathy   . Prostate cancer [facility]) urologist-  dr tannenbaum/  oncologist-- dr [name]   dx 03-25-2015  T1c, Gleason 4+3  s/p  cryoablation prostate 06-02-2015/  external  radiation therapy 08-16-2016 to 09-20-2016/  scheduled for radioactive seed implants 10-22-2016  . TIA (transient ischemic attack)    ?? 08/20/2014? Eliquis 07/02/15  . Type 2 diabetes mellitus ([place])    followed by dr [name] [facility] clinic)  last A1c 8.4 on 07-27-2016 per lov note  . Umbilical hernia   . Wears glasses   . Wears hearing aid    bilateral    Past Surgical History:  Procedure Laterality Date  . COLONOSCOPY WITH PROPOFOL N/A 05/30/2017   Procedure: COLONOSCOPY WITH PROPOFOL;  Surgeon: [name], MD;  Location: [facility];  Service: Endoscopy;  Laterality: N/A;  . CRYOABLATION N/A 06/02/2015   Procedure: CRYO ABLATION PROSTATE;  Surgeon: Carolan Clines, MD;  Location: [facility];  Service: Urology;  Laterality: N/A;  . CYSTOSCOPY WITH URETHRAL DILATATION N/A 05/04/2019   Procedure: CYSTOSCOPY WITH URETHRAL DILATATION;  Surgeon: [name], MD;  Location: [facility];  Service: Urology;  Laterality: N/A;  . ELBOW BURSA SURGERY Left Feb 1997  . prostate biopsies     x12, cryotherapy  .  RADIOACTIVE SEED IMPLANT N/A 10/22/2016   Procedure: RADIOACTIVE SEED IMPLANT/BRACHYTHERAPY IMPLANT, URETHRAL STRICTURE DIALIATION;  Surgeon: Carolan Clines, MD;  Location: ;  Service: Urology;  Laterality: N/A;  . SHOULDER OPEN ROTATOR CUFF REPAIR Left 09-04-2014  . skin cancer L ear     Dr [name]   . TOTAL KNEE ARTHROPLASTY Bilateral right 08-13-2008  &  left 11-14-2008  . UMBILICAL HERNIA REPAIR  06-23-2012  . ureter stretch  05/02/2019   [facility] Dr [name]    There were no vitals filed for this visit.   Subjective Assessment - 01/29/21 0912    Subjective Pateint reports  he is feeling well today and is having a good day. He printed out his FOTO stats and would like to fill it out again today because he feels his score would be higher. Reports when he got up this morning and had breakfast his pain was 0/10 but it went back up to 1.5/10 after standing, walking, talking to neighbor, driving over here. He was a little sore in the right low back after last session.    Pertinent History Jeremiah Zhang is a 83yoM who reports several months of 'serious' back issues, no help after attempted chiropractic treatment. Pt denies any particular contributing event.  Pt has been as high at 8-9/10, but since starting new medication 1 month prior, reports pain has been improved to 5-6/10 instance, but does not have pain all the time. Pt denies any pain migration, denies any pain or paresthesias of the legs. Pt has had xrays done showing scoliosis, otherwise no concerning findings. Pt has been taking tylenol daily for a month now. Pt has aggravation of pain with lifting items 35lbs or more, also with getting in/out of bed. Remote hx bilat TKA. 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 His wife is a retired Therapist, sports who worked at [employer].    Limitations Other (comment)    Patient Stated Goals get back pain to go away and be able to complete usual activities without pain    Currently in Pain? Yes    Pain Score 2     Pain Location Back    Pain Orientation Right          OBJECTIVE FOTO = 60 (01/29/2021)   TREATMENT:  Therapeutic exercise:to centralize symptoms and improve ROM, strength, muscular endurance, and activity tolerance required for successful completion of functional activities. -ambulation on TM at 2.1 mph no grade, 0.25 miles, with B UE/finger support and CGA to improve walking tolerance and prepare for subsequent interventions. Mild shortness of breath noted at end. - standing lumbar extensionover plinth2x20 (interspersed throughout  session) - runner's step up with U UE support, 1x20, 1x10 each side (harder with R LE down). - glute pull through, 35# cable, 3x10  - chops, 1x20 each direction with black band - scoops, 1x20 each direction with black band  Pt required multimodal cuing for proper technique and to facilitate improved neuromuscular control, strength, range of motion, and functional ability resulting in improved performance and form.   HOME EXERCISE PROGRAM Access Code: [email or web address] URL: [email or web address] Date: 01/19/2021 Prepared by: Rosita Kea  Exercises Supine Lower Trunk Rotation - 1 x daily - 1 sets - 20 reps Prone Hip Extension - 1 x daily - 3 sets - 10 reps Standing hip abduction with band around ankles - 1 x daily - 3 sets - 10 reps - 1 second hold Supine Quadratus Lumborum Stretch - 1-2  x daily - 2-3 reps - 30 seconds hold Standing Lumbar Extension with Counter - 3 x weekly - 4 sets - 15 reps Runner's Step Up/Down - 3 x weekly - 3 sets - 15-20 reps  [email or web address] [id number] Home Exercise Program [[id number]]  Box lift - Dustin - Repeat 10 Times, Complete 3 Sets, Perform 3 Times a Week  Pick One:  Lumbar Extension in Standing - Repeat 15 Times, Hold 2 Seconds, Complete 1 Set, Perform 5 Times a Day  Standing lumbar extension vs counter - Dustin - Repeat 15 Times, Hold 2 Seconds, Complete 1 Set, Perform 5 Times a Day  Standing lumbar extension - Repeat 15 Times, Hold 2 Seconds, Complete 1 Set, Perform 5 Times a Day    PT Education - 01/29/21 0934    Education Details exercise technique/form    Person(s) Educated Patient    Methods Explanation;Demonstration;Tactile cues;Verbal cues    Comprehension Verbalized understanding;Returned demonstration;Verbal cues required;Tactile cues required;Need further instruction            PT Short Term Goals - 01/14/21 1938      PT SHORT TERM GOAL #1   Title After 4 weeks pt to demonstrate improved Rt SLS >10 sec.     Baseline <3sec (11/05/2020); deferred measurement to next session (12/11/2020); 7  Seconds (01/14/2021);    Time 4    Period Weeks    Status Partially Met    Target Date 12/03/20             PT Long Term Goals - 01/14/21 1940      PT LONG TERM GOAL #1   Title After 8 weeks pt to demonstrate seated Rt hip ER strength 5/5.    Baseline 4/5    Time 8    Period Weeks    Status Achieved    Target Date 12/31/20      PT LONG TERM GOAL #2   Title After 12 weeks pt to demonstrate improved FOTO score to >70    Baseline 40/100 at eval (11/02/2020); 55/100 at visit 10 (12/11/2020); 53/100 visit 20 (01/14/2021);    Time 12    Period Weeks    Status Partially Met    Target Date 03/05/21      PT LONG TERM GOAL #3   Title After 8 weeks pt to demonstrate 5xSTS <11 seconds to demonstrate improve function of hip/back extension.    Baseline 12 seconds from 17 inch chair with no UE support (12/11/2020); 11 seconds from 18 inch chair with no UE support (01/14/2021);    Time 8    Period Weeks    Status Achieved    Target Date 12/31/20      PT LONG TERM GOAL #4   Title Patient will demonstrate floor to waist box lift of 50# or greater x 5 consecutive reps to demonstrate improved lifting ability for moving heavy items around the home.    Baseline Box lift floor to waist high table: up to 34.5# before he does not feel comfortable continuing due to concern for intiating increased pain and difficult overall (01/14/2021);    Time 7    Period Weeks    Status New    Target Date 03/05/21      PT LONG TERM GOAL #5   Title Patient will ascend/descend 87 steps with no increase in back pain to improve his ability to visit the [facility] or other activities that require several flights of steps.    Baseline Up/down 4 steps: increased  back pain "a little" (4/10). stopped due by getting tired and winded. 12 times no UE support (48 total steps)  (01/14/2021);    Time 7    Period Weeks    Status New    Target  Date 03/05/21      Additional Long Term Goals   Additional Long Term Goals Yes      PT LONG TERM GOAL #6   Title Patient will ambulate equal or greater than 1700 feet during 6 Minute Walk Test with no increase in back pain to demonstrate improved ability to complete community ambulation without pain.    Baseline 1500 feet pain increasing near 3 min but didn't really get any worse, felt like he was staggering the last 2 laps. Calf pain from pushing his speed especially two laps got up to went up to 3/10 (01/14/2021);    Time 7    Period Weeks    Status New    Target Date 03/05/21                 Plan - 01/29/21 0956    Clinical Impression Statement Patient tolerated treatment well with minimal increase in pain by end of session. Demo improved balance/tolerance/form but still found exercises challenging. Reports left scoops works the painful spot most. Patient would benefit from continued management of limiting condition by skilled physical therapist to address remaining impairments and functional limitations to work towards stated goals and return to PLOF or maximal functional independence.    Personal Factors and Comorbidities Age;Behavior Pattern;Fitness;Past/Current Experience    Examination-Activity Limitations Bathing;Locomotion Level;Bend;Carry;Sleep;Dressing;Stairs;Stand    Examination-Participation Restrictions Community Activity;Interpersonal Relationship;Yard Work    Stability/Clinical Decision Making Stable/Uncomplicated    Rehab Potential Good    PT Frequency 2x / week    PT Duration 12 weeks    PT Treatment/Interventions ADLs/Self Care Home Management;Gait training;Stair training;Joint Manipulations;Dry needling;Passive range of motion;Patient/family education;Therapeutic exercise;Balance training;Neuromuscular re-education;DME Instruction;Moist Heat;Electrical Stimulation    PT Next Visit Plan lumbar extension, manual therapy as needed; focus on improved lifting, stairs,  walking, standing, bending activities    PT Home Exercise Plan Medbridge Access Code: [email or web address] ; [email or web address]  [id number] Home Exercise Program ([email or web address])    Consulted and Agree with Plan of Care Patient           Patient will benefit from skilled therapeutic intervention in order to improve the following deficits and impairments:  Abnormal gait,Decreased range of motion,Difficulty walking,Increased muscle spasms,Decreased activity tolerance,Decreased knowledge of precautions,Decreased balance,Decreased mobility,Decreased strength,Improper body mechanics  Visit Diagnosis: Chronic right-sided low back pain without sciatica  Stiffness of right hip, not elsewhere classified     Problem List Patient Active Problem List   Diagnosis Date Noted  . Personal history of other malignant neoplasm of skin 01/02/2020  . GERD (gastroesophageal reflux disease) 08/28/2019  . Hyperglycemia 08/28/2019  . Hyperlipidemia 08/28/2019  . Hypertension 08/28/2019  . Obesity 08/28/2019  . Myalgia due to statin 12/28/2018  . Erectile dysfunction following radiation therapy 08/10/2018  . Hypogonadism in male 05/11/2018  . Diabetes mellitus without complication ([place]) 36/14/4315  . Prostate cancer ([place]) 07/14/2016  . Status post rotator cuff repair 01/05/2015  . Encounter for long-term (current) use of other medications 01/15/2014  . Benign prostatic hyperplasia without lower urinary tract symptoms 06/27/2012   Everlean Alstrom. Graylon Good, PT, DPT 01/29/21, 9:56 AM  [facility] [address] [place], [address] Phone: [phone]   Fax:  [phone]  Name: Jeremiah Zhang  Jeremiah Zhang MRN: [id number] Date of Birth: June 22, 1946

## 2021-02-03 ENCOUNTER — Other Ambulatory Visit: Payer: Self-pay

## 2021-02-03 ENCOUNTER — Encounter: Payer: Self-pay | Admitting: Podiatry

## 2021-02-03 ENCOUNTER — Ambulatory Visit (INDEPENDENT_AMBULATORY_CARE_PROVIDER_SITE_OTHER): Payer: PPO | Admitting: Podiatry

## 2021-02-03 ENCOUNTER — Encounter: Payer: Self-pay | Admitting: Physical Therapy

## 2021-02-03 ENCOUNTER — Ambulatory Visit: Payer: PPO | Admitting: Physical Therapy

## 2021-02-03 DIAGNOSIS — M79674 Pain in right toe(s): Secondary | ICD-10-CM | POA: Diagnosis not present

## 2021-02-03 DIAGNOSIS — G8929 Other chronic pain: Secondary | ICD-10-CM

## 2021-02-03 DIAGNOSIS — B351 Tinea unguium: Secondary | ICD-10-CM | POA: Diagnosis not present

## 2021-02-03 DIAGNOSIS — M79675 Pain in left toe(s): Secondary | ICD-10-CM | POA: Diagnosis not present

## 2021-02-03 DIAGNOSIS — M545 Low back pain, unspecified: Secondary | ICD-10-CM

## 2021-02-03 DIAGNOSIS — E1149 Type 2 diabetes mellitus with other diabetic neurological complication: Secondary | ICD-10-CM | POA: Diagnosis not present

## 2021-02-03 DIAGNOSIS — M25651 Stiffness of right hip, not elsewhere classified: Secondary | ICD-10-CM

## 2021-02-03 NOTE — Therapy (Addendum)
[place] [facility] [address], [place], [address] Phone: [phone]   Fax:  [phone]  Physical Therapy Treatment  Patient Details  Name: Jeremiah Zhang MRN: [id number] Date of Birth: 02-16-1946 Referring Provider (PT): [name]   Encounter Date: 02/03/2021   PT End of Session - 02/03/21 1112    Visit Number 24    Number of Visits 34    Date for PT Re-Evaluation 03/05/21    Authorization Type [employer] reporting period from 01/14/2021    Progress Note Due on Visit 10    PT Start Time 1031    PT Stop Time 1113    PT Time Calculation (min) 42 min    Equipment Utilized During Treatment Gait belt    Activity Tolerance Patient tolerated treatment well    Behavior During Therapy [facility] for tasks assessed/performed           Past Medical History:  Diagnosis Date  . Actinic keratosis   . Anticoagulant long-term use    ELIQUIS  . Arthritis   . Basal cell carcinoma 01/27/2010   Right spinal upper back. Superficial.   . Basal cell carcinoma 11/15/2019   Left inferior antihelix post. to tragus. Superficial and nodular patterns. Excised: 11/27/2019  . Basal cell carcinoma 11/15/2019   Left mid lateral nose. Nodular and infiltrative patterns. Excised: 11/27/2019  . Basal cell carcinoma 11/27/2019   Left mid superior helix. BCC with focal sclerosis.  . Basal cell carcinoma 01/29/2020   Left mid superior ear helix  . 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 Slight atypia. Edges free.  . ED (erectile dysfunction)   . GERD (gastroesophageal reflux disease)   . Hiatal hernia   . History of DVT of lower extremity    POST OP KNEE SURGERY  . History of transient ischemic attack (TIA)    PER PT HX  ?POSSIBLE-- PT ON ELIQUIS  . Hyperlipidemia   . Hypertension   . Hypogonadism male   . OSA (obstructive sleep apnea)    per pt positive sleep study yrs ago recommended CPAP --  pt Non- compliant --  states uses Breath Rite Nasal Strips  . Peripheral neuropathy   . Prostate cancer [facility]) urologist-  dr tannenbaum/  oncologist-- dr [name]   dx 03-25-2015  T1c, Gleason 4+3  s/p  cryoablation prostate 06-02-2015/  external  radiation therapy 08-16-2016 to 09-20-2016/  scheduled for radioactive seed implants 10-22-2016  . TIA (transient ischemic attack)    ?? 08/20/2014? Eliquis 07/02/15  . Type 2 diabetes mellitus ([place])    followed by dr [name] [facility] clinic)  last A1c 8.4 on 07-27-2016 per lov note  . Umbilical hernia   . Wears glasses   . Wears hearing aid    bilateral    Past Surgical History:  Procedure Laterality Date  . COLONOSCOPY WITH PROPOFOL N/A 05/30/2017   Procedure: COLONOSCOPY WITH PROPOFOL;  Surgeon: [name], MD;  Location[facility] ENDOSCOPY;  Service: Endoscopy;  Laterality: N/A;  . CRYOABLATION N/A 06/02/2015   Procedure: CRYO ABLATION PROSTATE;  Surgeon: Carolan Clines, MD;  Location: [facility];  Service: Urology;  Laterality: N/A;  . CYSTOSCOPY WITH URETHRAL DILATATION N/A 05/04/2019   Procedure: CYSTOSCOPY WITH URETHRAL DILATATION;  Surgeon: [name], MD;  Location: [facility];  Service: Urology;  Laterality: N/A;  . ELBOW BURSA SURGERY Left Feb 1997  . prostate biopsies     x12, cryotherapy  .  RADIOACTIVE SEED IMPLANT N/A 10/22/2016   Procedure: RADIOACTIVE SEED IMPLANT/BRACHYTHERAPY IMPLANT, URETHRAL STRICTURE DIALIATION;  Surgeon: Carolan Clines, MD;  Location: [place];  Service: Urology;  Laterality: N/A;  . SHOULDER OPEN ROTATOR CUFF REPAIR Left 09-04-2014  . skin cancer L ear     Dr [name]   . TOTAL KNEE ARTHROPLASTY Bilateral right 08-13-2008  &  left 11-14-2008  . UMBILICAL HERNIA REPAIR  06-23-2012  . ureter stretch  05/02/2019   [facility] Dr [name]    There were no vitals filed for this visit.   Subjective Assessment - 02/03/21 1107    Subjective Patient reports  he is feeling well today and his back has been feeling better over this weekend than he has since starting PT. Rates pain as less than 1/10 at the left. Felt good following last treatment session. Brought pictures of his lumbar radiographs that show levoscoliosis with apex between L4 and L5.    Pertinent History Jeremiah Zhang is a 21yoM who reports several months of 'serious' back issues, no help after attempted chiropractic treatment. Pt denies any particular contributing event.  Pt has been as high at 8-9/10, but since starting new medication 1 month prior, reports pain has been improved to 5-6/10 instance, but does not have pain all the time. Pt denies any pain migration, denies any pain or paresthesias of the legs. Pt has had xrays done showing scoliosis, otherwise no concerning findings. Pt has been taking tylenol daily for a month now. Pt has aggravation of pain with lifting items 35lbs or more, also with getting in/out of bed. Remote hx bilat TKA. 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 His wife is a retired Therapist, sports who worked at [employer].    Limitations Other (comment)    Patient Stated Goals get back pain to go away and be able to complete usual activities without pain    Currently in Pain? Yes    Pain Score --   0.5/10          OBJECTIVE FOTO = 60 (01/29/2021)   TREATMENT:  Therapeutic exercise:to centralize symptoms and improve ROM, strength, muscular endurance, and activity tolerance required for successful completion of functional activities. -ambulationon TM at 2.2 mph no grade, 0.25 miles, with B UE/finger support and CGAto improve walking tolerance and prepare for subsequent interventions. Mild shortness of breath noted at end.  - standing lumbar extensionover plinth1x15 (interspersed throughout session) - runner's step up with U UE support,3x10,each side (harder with R LE down). - glute pull through, 45# cable, 3x10  -chops, 1x20 each  direction with black band - scoops, 1x20 each direction with black band  Pt required multimodal cuing for proper technique and to facilitate improved neuromuscular control, strength, range of motion, and functional ability resulting in improved performance and form.   HOME EXERCISE PROGRAM Access Code: [email or web address] URL: [email or web address] Date: 01/19/2021 Prepared by: Rosita Kea  Exercises Supine Lower Trunk Rotation - 1 x daily - 1 sets - 20 reps Prone Hip Extension - 1 x daily - 3 sets - 10 reps Standing hip abduction with band around ankles - 1 x daily - 3 sets - 10 reps - 1 second hold Supine Quadratus Lumborum Stretch - 1-2 x daily - 2-3 reps - 30 seconds hold Standing Lumbar Extension with Counter - 3 x weekly - 4 sets - 15 reps Runner's Step Up/Down - 3 x weekly - 3 sets - 15-20 reps  [email or web address] [id number] Home Exercise  Program [[id number]]  Box lift - Dustin - Repeat 10 Times, Complete 3 Sets, Perform 3 Times a Week  Pick One:  Lumbar Extension in Standing - Repeat 15 Times, Hold 2 Seconds, Complete 1 Set, Perform 5 Times a Day  Standing lumbar extension vs counter - Dustin - Repeat 15 Times, Hold 2 Seconds, Complete 1 Set, Perform 5 Times a Day  Standing lumbar extension - Repeat 15 Times, Hold 2 Seconds, Complete 1 Set, Perform 5 Times a Day    PT Education - 02/03/21 1114    Education Details exercise technique/form    Person(s) Educated Patient    Methods Explanation;Demonstration;Tactile cues;Verbal cues    Comprehension Verbalized understanding;Returned demonstration;Verbal cues required;Tactile cues required;Need further instruction            PT Short Term Goals - 01/14/21 1938      PT SHORT TERM GOAL #1   Title After 4 weeks pt to demonstrate improved Rt SLS >10 sec.    Baseline <3sec (11/05/2020); deferred measurement to next session (12/11/2020); 7  Seconds (01/14/2021);    Time 4    Period Weeks    Status Partially Met     Target Date 12/03/20             PT Long Term Goals - 01/14/21 1940      PT LONG TERM GOAL #1   Title After 8 weeks pt to demonstrate seated Rt hip ER strength 5/5.    Baseline 4/5    Time 8    Period Weeks    Status Achieved    Target Date 12/31/20      PT LONG TERM GOAL #2   Title After 12 weeks pt to demonstrate improved FOTO score to >70    Baseline 40/100 at eval (11/02/2020); 55/100 at visit 10 (12/11/2020); 53/100 visit 20 (01/14/2021);    Time 12    Period Weeks    Status Partially Met    Target Date 03/05/21      PT LONG TERM GOAL #3   Title After 8 weeks pt to demonstrate 5xSTS <11 seconds to demonstrate improve function of hip/back extension.    Baseline 12 seconds from 17 inch chair with no UE support (12/11/2020); 11 seconds from 18 inch chair with no UE support (01/14/2021);    Time 8    Period Weeks    Status Achieved    Target Date 12/31/20      PT LONG TERM GOAL #4   Title Patient will demonstrate floor to waist box lift of 50# or greater x 5 consecutive reps to demonstrate improved lifting ability for moving heavy items around the home.    Baseline Box lift floor to waist high table: up to 34.5# before he does not feel comfortable continuing due to concern for intiating increased pain and difficult overall (01/14/2021);    Time 7    Period Weeks    Status New    Target Date 03/05/21      PT LONG TERM GOAL #5   Title Patient will ascend/descend 87 steps with no increase in back pain to improve his ability to visit the [facility] or other activities that require several flights of steps.    Baseline Up/down 4 steps: increased back pain "a little" (4/10). stopped due by getting tired and winded. 12 times no UE support (48 total steps)  (01/14/2021);    Time 7    Period Weeks    Status New    Target  Date 03/05/21      Additional Long Term Goals   Additional Long Term Goals Yes      PT LONG TERM GOAL #6   Title Patient will ambulate equal or greater  than 1700 feet during 6 Minute Walk Test with no increase in back pain to demonstrate improved ability to complete community ambulation without pain.    Baseline 1500 feet pain increasing near 3 min but didn't really get any worse, felt like he was staggering the last 2 laps. Calf pain from pushing his speed especially two laps got up to went up to 3/10 (01/14/2021);    Time 7    Period Weeks    Status New    Target Date 03/05/21                 Plan - 02/03/21 1112    Clinical Impression Statement Patient tolerated treatment well overall with no increase in pain by end of session. Able to progress several exercises targeting functional strength and posterior chain. Patient would benefit from continued management of limiting condition by skilled physical therapist to address remaining impairments and functional limitations to work towards stated goals and return to PLOF or maximal functional independence.    Personal Factors and Comorbidities Age;Behavior Pattern;Fitness;Past/Current Experience    Examination-Activity Limitations Bathing;Locomotion Level;Bend;Carry;Sleep;Dressing;Stairs;Stand    Examination-Participation Restrictions Community Activity;Interpersonal Relationship;Yard Work    Stability/Clinical Decision Making Stable/Uncomplicated    Rehab Potential Good    PT Frequency 2x / week    PT Duration 12 weeks    PT Treatment/Interventions ADLs/Self Care Home Management;Gait training;Stair training;Joint Manipulations;Dry needling;Passive range of motion;Patient/family education;Therapeutic exercise;Balance training;Neuromuscular re-education;DME Instruction;Moist Heat;Electrical Stimulation    PT Next Visit Plan lumbar extension, manual therapy as needed; focus on improved lifting, stairs, walking, standing, bending activities    PT Home Exercise Plan Medbridge Access Code: [email or web address] ; [email or web address]  [id number] Home Exercise Program ([email or web address])    Consulted and Agree with Plan of Care  Patient           Patient will benefit from skilled therapeutic intervention in order to improve the following deficits and impairments:  Abnormal gait,Decreased range of motion,Difficulty walking,Increased muscle spasms,Decreased activity tolerance,Decreased knowledge of precautions,Decreased balance,Decreased mobility,Decreased strength,Improper body mechanics  Visit Diagnosis: Chronic right-sided low back pain without sciatica  Stiffness of right hip, not elsewhere classified     Problem List Patient Active Problem List   Diagnosis Date Noted  . Personal history of other malignant neoplasm of skin 01/02/2020  . GERD (gastroesophageal reflux disease) 08/28/2019  . Hyperglycemia 08/28/2019  . Hyperlipidemia 08/28/2019  . Hypertension 08/28/2019  . Obesity 08/28/2019  . Myalgia due to statin 12/28/2018  . Erectile dysfunction following radiation therapy 08/10/2018  . Hypogonadism in male 05/11/2018  . Diabetes mellitus without complication ([employer]) 65/78/4696  . Prostate cancer ([place]) 07/14/2016  . Status post rotator cuff repair 01/05/2015  . Encounter for long-term (current) use of other medications 01/15/2014  . Benign prostatic hyperplasia without lower urinary tract symptoms 06/27/2012    Everlean Alstrom. Graylon Good, PT, DPT 02/03/21, 11:14 AM  [place] [facility] [address], [place], [address] Phone: [phone]   Fax:  [phone]  Name: Jeremiah Zhang MRN: [id number] Date of Birth: 10-May-1946

## 2021-02-05 ENCOUNTER — Ambulatory Visit: Payer: PPO | Admitting: Physical Therapy

## 2021-02-05 ENCOUNTER — Other Ambulatory Visit: Payer: Self-pay

## 2021-02-05 ENCOUNTER — Encounter: Payer: Self-pay | Admitting: Physical Therapy

## 2021-02-05 DIAGNOSIS — M545 Low back pain, unspecified: Secondary | ICD-10-CM | POA: Diagnosis not present

## 2021-02-05 DIAGNOSIS — M25651 Stiffness of right hip, not elsewhere classified: Secondary | ICD-10-CM

## 2021-02-05 NOTE — Therapy (Addendum)
[facility] [address], [place], [address] Phone: [phone]   Fax:  [phone]  Physical Therapy Treatment  Patient Details  Name: Jeremiah Zhang MRN: [id number] Date of Birth: August 24, 1946 Referring Provider (PT): [name]   Encounter Date: 02/05/2021   PT End of Session - 02/05/21 1954    Visit Number 25    Number of Visits 34    Date for PT Re-Evaluation 03/05/21    Authorization Type [employer] reporting period from 01/14/2021    Progress Note Due on Visit 10    PT Start Time 1605    PT Stop Time 1643    PT Time Calculation (min) 38 min    Equipment Utilized During Treatment Gait belt    Activity Tolerance Patient tolerated treatment well;Patient limited by fatigue    Behavior During Therapy [facility] for tasks assessed/performed           Past Medical History:  Diagnosis Date  . Actinic keratosis   . Anticoagulant long-term use    ELIQUIS  . Arthritis   . Basal cell carcinoma 01/27/2010   Right spinal upper back. Superficial.   . Basal cell carcinoma 11/15/2019   Left inferior antihelix post. to tragus. Superficial and nodular patterns. Excised: 11/27/2019  . Basal cell carcinoma 11/15/2019   Left mid lateral nose. Nodular and infiltrative patterns. Excised: 11/27/2019  . Basal cell carcinoma 11/27/2019   Left mid superior helix. BCC with focal sclerosis.  . Basal cell carcinoma 01/29/2020   Left mid superior ear helix  . 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 Slight atypia. Edges free.  . ED (erectile dysfunction)   . GERD (gastroesophageal reflux disease)   . Hiatal hernia   . History of DVT of lower extremity    POST OP KNEE SURGERY  . History of transient ischemic attack (TIA)    PER PT HX  ?POSSIBLE-- PT ON ELIQUIS  . Hyperlipidemia   . Hypertension   . Hypogonadism male   . OSA (obstructive sleep apnea)    per pt positive sleep study yrs ago recommended CPAP --   pt Non- compliant -- states uses Breath Rite Nasal Strips  . Peripheral neuropathy   . Prostate cancer [facility]) urologist-  dr tannenbaum/  oncologist-- dr [name]   dx 03-25-2015  T1c, Gleason 4+3  s/p  cryoablation prostate 06-02-2015/  external  radiation therapy 08-16-2016 to 09-20-2016/  scheduled for radioactive seed implants 10-22-2016  . TIA (transient ischemic attack)    ?? 08/20/2014? Eliquis 07/02/15  . Type 2 diabetes mellitus ([place] )    followed by dr [name] [facility])  last A1c 8.4 on 07-27-2016 per lov note  . Umbilical hernia   . Wears glasses   . Wears hearing aid    bilateral    Past Surgical History:  Procedure Laterality Date  . COLONOSCOPY WITH PROPOFOL N/A 05/30/2017   Procedure: COLONOSCOPY WITH PROPOFOL;  Surgeon: [name], MD;  Location: [facility] ENDOSCOPY;  Service: Endoscopy;  Laterality: N/A;  . CRYOABLATION N/A 06/02/2015   Procedure: CRYO ABLATION PROSTATE;  Surgeon: Carolan Clines, MD;  Location: [facility];  Service: Urology;  Laterality: N/A;  . CYSTOSCOPY WITH URETHRAL DILATATION N/A 05/04/2019   Procedure: CYSTOSCOPY WITH URETHRAL DILATATION;  Surgeon: [name], MD;  Location: [facility];  Service: Urology;  Laterality: N/A;  . ELBOW BURSA SURGERY Left Feb 1997  . prostate biopsies  x12, cryotherapy  . RADIOACTIVE SEED IMPLANT N/A 10/22/2016   Procedure: RADIOACTIVE SEED IMPLANT/BRACHYTHERAPY IMPLANT, URETHRAL STRICTURE DIALIATION;  Surgeon: Carolan Clines, MD;  Location: [facility];  Service: Urology;  Laterality: N/A;  . SHOULDER OPEN ROTATOR CUFF REPAIR Left 09-04-2014  . skin cancer L ear     Dr [name]   . TOTAL KNEE ARTHROPLASTY Bilateral right 08-13-2008  &  left 11-14-2008  . UMBILICAL HERNIA REPAIR  06-23-2012  . ureter stretch  05/02/2019   [facility] Dr [name]    There were no vitals filed for this visit.   Subjective Assessment - 02/05/21 1952     Subjective Patient states his back pain remains below 1/10 and he has had one of the best weeks ever this week in regards to back pain. He saw a podiatris who provided him with shoe inserts that he feels is helping as well. Still notices he wobbles when walking barefoot. His mother in law passed away this week, so he has been busy with helping with arrangements and is getting ready to travel for funeral next week. He will not be able to come to PT next week. Mother in law was in hospice care locally. No soreness following last PT session.    Pertinent History Jeremiah Zhang is a 16yoM who reports several months of 'serious' back issues, no help after attempted chiropractic treatment. Pt denies any particular contributing event.  Pt has been as high at 8-9/10, but since starting new medication 1 month prior, reports pain has been improved to 5-6/10 instance, but does not have pain all the time. Pt denies any pain migration, denies any pain or paresthesias of the legs. Pt has had xrays done showing scoliosis, otherwise no concerning findings. Pt has been taking tylenol daily for a month now. Pt has aggravation of pain with lifting items 35lbs or more, also with getting in/out of bed. Remote hx bilat TKA. 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 His wife is a retired Therapist, sports who worked at [employer].    Limitations Other (comment)    Patient Stated Goals get back pain to go away and be able to complete usual activities without pain    Currently in Pain? Yes    Pain Score --   less than 1/10   Pain Location Back    Pain Orientation Right           OBJECTIVE FOTO = 60 (01/29/2021)  TREATMENT:  Therapeutic exercise:to centralize symptoms and improve ROM, strength, muscular endurance, and activity tolerance required for successful completion of functional activities. -ambulationon TM at 2.16mh no grade, 0.25 miles, with B UE/finger support and CGAto improve walking  tolerance and prepare for subsequent interventions. Mild shortness of breath noted at end. - standing lumbar extensionover [id number]  - runner's step up with U UE support,3x15/15/10,each side (harder with R LE down).Limited by shortness of breath.  - standing wall sags, 1x15-20 - glute pull through,45# cable, 2x10   Pt required multimodal cuing for proper technique and to facilitate improved neuromuscular control, strength, range of motion, and functional ability resulting in improved performance and form.   HOME EXERCISE PROGRAM Access Code: [email or web address]: [email or web address] Date: 01/19/2021 Prepared by: SRosita Kea Exercises Supine Lower Trunk Rotation - 1 x daily - 1 sets - 20 reps Prone Hip Extension - 1 x daily - 3 sets - 10 reps Standing hip abduction with band around ankles - 1 x daily - 3  sets - 10 reps - 1 second hold Supine Quadratus Lumborum Stretch - 1-2 x daily - 2-3 reps - 30 seconds hold Standing Lumbar Extension with Counter - 3 x weekly - 4 sets - 15 reps Runner's Step Up/Down - 3 x weekly - 3 sets - 15-20 reps  [email or web address] [id number] Home Exercise Program [[id number]]  Box lift - Dustin - Repeat 10 Times, Complete 3 Sets, Perform 3 Times a Week  Pick One:  Lumbar Extension in Standing - Repeat 15 Times, Hold 2 Seconds, Complete 1 Set, Perform 5 Times a Day  Standing lumbar extension vs counter - Dustin - Repeat 15 Times, Hold 2 Seconds, Complete 1 Set, Perform 5 Times a Day  Standing lumbar extension - Repeat 15 Times, Hold 2 Seconds, Complete 1 Set, Perform 5 Times a Day   PT Education - 02/05/21 1954    Education Details exercise technique/form    Person(s) Educated Patient    Methods Explanation;Demonstration;Tactile cues;Verbal cues    Comprehension Verbalized understanding;Returned demonstration;Verbal cues required;Tactile cues required;Need further instruction            PT Short Term Goals - 01/14/21 1938       PT SHORT TERM GOAL #1   Title After 4 weeks pt to demonstrate improved Rt SLS >10 sec.    Baseline <3sec (11/05/2020); deferred measurement to next session (12/11/2020); 7  Seconds (01/14/2021);    Time 4    Period Weeks    Status Partially Met    Target Date 12/03/20             PT Long Term Goals - 01/14/21 1940      PT LONG TERM GOAL #1   Title After 8 weeks pt to demonstrate seated Rt hip ER strength 5/5.    Baseline 4/5    Time 8    Period Weeks    Status Achieved    Target Date 12/31/20      PT LONG TERM GOAL #2   Title After 12 weeks pt to demonstrate improved FOTO score to >70    Baseline 40/100 at eval (11/02/2020); 55/100 at visit 10 (12/11/2020); 53/100 visit 20 (01/14/2021);    Time 12    Period Weeks    Status Partially Met    Target Date 03/05/21      PT LONG TERM GOAL #3   Title After 8 weeks pt to demonstrate 5xSTS <11 seconds to demonstrate improve function of hip/back extension.    Baseline 12 seconds from 17 inch chair with no UE support (12/11/2020); 11 seconds from 18 inch chair with no UE support (01/14/2021);    Time 8    Period Weeks    Status Achieved    Target Date 12/31/20      PT LONG TERM GOAL #4   Title Patient will demonstrate floor to waist box lift of 50# or greater x 5 consecutive reps to demonstrate improved lifting ability for moving heavy items around the home.    Baseline Box lift floor to waist high table: up to 34.5# before he does not feel comfortable continuing due to concern for intiating increased pain and difficult overall (01/14/2021);    Time 7    Period Weeks    Status New    Target Date 03/05/21      PT LONG TERM GOAL #5   Title Patient will ascend/descend 87 steps with no increase in back pain to improve his ability to visit the [facility] or other activities that  require several flights of steps.    Baseline Up/down 4 steps: increased back pain "a little" (4/10). stopped due by getting tired and winded. 12 times no UE  support (48 total steps)  (01/14/2021);    Time 7    Period Weeks    Status New    Target Date 03/05/21      Additional Long Term Goals   Additional Long Term Goals Yes      PT LONG TERM GOAL #6   Title Patient will ambulate equal or greater than 1700 feet during 6 Minute Walk Test with no increase in back pain to demonstrate improved ability to complete community ambulation without pain.    Baseline 1500 feet pain increasing near 3 min but didn't really get any worse, felt like he was staggering the last 2 laps. Calf pain from pushing his speed especially two laps got up to went up to 3/10 (01/14/2021);    Time 7    Period Weeks    Status New    Target Date 03/05/21                 Plan - 02/05/21 1958    Clinical Impression Statement Patient tolerated treatment well overall without lasting increase in pain but did request to stop before planned exercises were completed due to fatigue. He was limited most by shortness of breath due to heavy and repetative nature of exercises completed. He was able to do a total of 80 step ups to a 12 inch step with touch down UE support (with rests every 15-10 step ups) and was able to walk more quickly in the treadmill this session. Focus of therapy continues to be improving activity tolerance for activities patient reported were limited for him. Appears to be making good progress. Patient would benefit from continued management of limiting condition by skilled physical therapist to address remaining impairments and functional limitations to work towards stated goals and return to PLOF or maximal functional independence.    Personal Factors and Comorbidities Age;Behavior Pattern;Fitness;Past/Current Experience    Examination-Activity Limitations Bathing;Locomotion Level;Bend;Carry;Sleep;Dressing;Stairs;Stand    Examination-Participation Restrictions Community Activity;Interpersonal Relationship;Yard Work    Stability/Clinical Decision Making  Stable/Uncomplicated    Rehab Potential Good    PT Frequency 2x / week    PT Duration 12 weeks    PT Treatment/Interventions ADLs/Self Care Home Management;Gait training;Stair training;Joint Manipulations;Dry needling;Passive range of motion;Patient/family education;Therapeutic exercise;Balance training;Neuromuscular re-education;DME Instruction;Moist Heat;Electrical Stimulation    PT Next Visit Plan lumbar extension, manual therapy as needed; focus on improved lifting, stairs, walking, standing, bending activities    PT Home Exercise Plan Medbridge Access Code: [email or web address] ; [email or web address]  [id number] Home Exercise Program ([email or web address])    Consulted and Agree with Plan of Care Patient           Patient will benefit from skilled therapeutic intervention in order to improve the following deficits and impairments:  Abnormal gait,Decreased range of motion,Difficulty walking,Increased muscle spasms,Decreased activity tolerance,Decreased knowledge of precautions,Decreased balance,Decreased mobility,Decreased strength,Improper body mechanics  Visit Diagnosis: Chronic right-sided low back pain without sciatica  Stiffness of right hip, not elsewhere classified     Problem List Patient Active Problem List   Diagnosis Date Noted  . Personal history of other malignant neoplasm of skin 01/02/2020  . GERD (gastroesophageal reflux disease) 08/28/2019  . Hyperglycemia 08/28/2019  . Hyperlipidemia 08/28/2019  . Hypertension 08/28/2019  . Obesity 08/28/2019  . Myalgia due to statin 12/28/2018  . Erectile dysfunction following radiation therapy 08/10/2018  . Hypogonadism  in male 05/11/2018  . Diabetes mellitus without complication ([place]) 55/20/8022  . Prostate cancer ([place]) 07/14/2016  . Status post rotator cuff repair 01/05/2015  . Encounter for long-term (current) use of other medications 01/15/2014  . Benign prostatic hyperplasia without lower urinary tract symptoms 06/27/2012    Everlean Alstrom. Graylon Good, PT,  DPT 02/05/21, 7:59 PM  [facility] [address] [place], [address] Phone: [phone]   Fax:  [phone]  Name: Jeremiah Zhang MRN: [id number] Date of Birth: [birth year]

## 2021-02-08 NOTE — Progress Notes (Signed)
Subjective: 75 y.o. returns the office today for painful, elongated, thickened toenails which he cannot trim himself.  No redness or drainage or signs of infection noted.  No recent injury or falls. Denies any systemic complaints such as fevers, chills, nausea, vomiting.   Currently physical therapy for his back.  PCP: [name], MD  Objective: AAO 3, NAD DP/PT pulses palpable, CRT less than 3 seconds Nails hypertrophic, dystrophic, elongated, brittle, discolored 10. There is tenderness overlying the nails 1-5 bilaterally. There is no surrounding erythema or drainage along the nail sites. No open lesions or pre-ulcerative lesions are identified  Bunion present however not causing any pain currently. No pain with calf compression, swelling, warmth, erythema.  Assessment: Patient presents with symptomatic onychomycosis  Plan: -Treatment options including alternatives, risks, complications were discussed -Nails sharply debrided 10 without complication/bleeding.   -Discussed daily foot inspection. If there are any changes, to call the office immediately.  -Follow-up in 3 months or sooner if any problems are to arise. In the meantime, encouraged to call the office with any questions, concerns, changes symptoms.  Celesta Gentile, DPM

## 2021-02-09 ENCOUNTER — Ambulatory Visit: Payer: PPO | Admitting: Physical Therapy

## 2021-02-11 ENCOUNTER — Ambulatory Visit: Payer: PPO | Admitting: Physical Therapy

## 2021-02-16 ENCOUNTER — Ambulatory Visit: Payer: PPO | Admitting: Physical Therapy

## 2021-02-16 DIAGNOSIS — G4733 Obstructive sleep apnea (adult) (pediatric): Secondary | ICD-10-CM | POA: Diagnosis not present

## 2021-02-18 ENCOUNTER — Ambulatory Visit: Payer: PPO | Attending: Family Medicine | Admitting: Physical Therapy

## 2021-02-18 ENCOUNTER — Other Ambulatory Visit: Payer: Self-pay

## 2021-02-18 DIAGNOSIS — G8929 Other chronic pain: Secondary | ICD-10-CM | POA: Diagnosis not present

## 2021-02-18 DIAGNOSIS — M25651 Stiffness of right hip, not elsewhere classified: Secondary | ICD-10-CM | POA: Insufficient documentation

## 2021-02-18 DIAGNOSIS — M545 Low back pain, unspecified: Secondary | ICD-10-CM | POA: Insufficient documentation

## 2021-02-18 NOTE — Therapy (Signed)
[place] [facility] [address], [place], [address] Phone: [phone]   Fax:  [phone]  Physical Therapy Treatment / Discharge Summary Dates of reporting: 11/05/2020 - 02/18/2021  Patient Details  Name: Jeremiah Zhang MRN: [id number] Date of Birth: May 10, 1946 Referring Provider (Jeremiah Zhang): [name]   Encounter Date: 02/18/2021   Jeremiah Zhang End of Session - 02/19/21 0919    Visit Number 26    Number of Visits 34    Date for Jeremiah Zhang Re-Evaluation 03/05/21    Authorization Type [employer] reporting period from 01/14/2021    Progress Note Due on Visit 30    Jeremiah Zhang Start Time 1443    Jeremiah Zhang Stop Time 1513    Jeremiah Zhang Time Calculation (min) 30 min    Equipment Utilized During Treatment --    Activity Tolerance Patient tolerated treatment well;Patient limited by fatigue    Behavior During Therapy [facility] for tasks assessed/performed           Past Medical History:  Diagnosis Date  . Actinic keratosis   . Anticoagulant long-term use    ELIQUIS  . Arthritis   . Basal cell carcinoma 01/27/2010   Right spinal upper back. Superficial.   . Basal cell carcinoma 11/15/2019   Left inferior antihelix post. to tragus. Superficial and nodular patterns. Excised: 11/27/2019  . Basal cell carcinoma 11/15/2019   Left mid lateral nose. Nodular and infiltrative patterns. Excised: 11/27/2019  . Basal cell carcinoma 11/27/2019   Left mid superior helix. BCC with focal sclerosis.  . Basal cell carcinoma 01/29/2020   Left mid superior ear helix  . 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 Slight atypia. Edges free.  . ED (erectile dysfunction)   . GERD (gastroesophageal reflux disease)   . Hiatal hernia   . History of DVT of lower extremity    POST OP KNEE SURGERY  . History of transient ischemic attack (TIA)    PER Jeremiah Zhang HX  ?POSSIBLE-- Jeremiah Zhang ON ELIQUIS  . Hyperlipidemia   . Hypertension   . Hypogonadism male   . OSA (obstructive sleep apnea)     per Jeremiah Zhang positive sleep study yrs ago recommended CPAP --  Jeremiah Zhang Non- compliant -- states uses Breath Rite Nasal Strips  . Peripheral neuropathy   . Prostate cancer [facility]) urologist-  dr tannenbaum/  oncologist-- dr [name]   dx 03-25-2015  T1c, Gleason 4+3  s/p  cryoablation prostate 06-02-2015/  external  radiation therapy 08-16-2016 to 09-20-2016/  scheduled for radioactive seed implants 10-22-2016  . TIA (transient ischemic attack)    ?? 08/20/2014? Eliquis 07/02/15  . Type 2 diabetes mellitus ([place])    followed by dr [name] [facility])  last A1c 8.4 on 07-27-2016 per lov note  . Umbilical hernia   . Wears glasses   . Wears hearing aid    bilateral    Past Surgical History:  Procedure Laterality Date  . COLONOSCOPY WITH PROPOFOL N/A 05/30/2017   Procedure: COLONOSCOPY WITH PROPOFOL;  Surgeon: [name], MD;  Location: [facility] ENDOSCOPY;  Service: Endoscopy;  Laterality: N/A;  . CRYOABLATION N/A 06/02/2015   Procedure: CRYO ABLATION PROSTATE;  Surgeon: Carolan Clines, MD;  Location: [facility];  Service: Urology;  Laterality: N/A;  . CYSTOSCOPY WITH URETHRAL DILATATION N/A 05/04/2019   Procedure: CYSTOSCOPY WITH URETHRAL DILATATION;  Surgeon: [name], MD;  Location: [facility];  Service: Urology;  Laterality: N/A;  . ELBOW BURSA SURGERY Left Feb 1997  .  prostate biopsies     x12, cryotherapy  . RADIOACTIVE SEED IMPLANT N/A 10/22/2016   Procedure: RADIOACTIVE SEED IMPLANT/BRACHYTHERAPY IMPLANT, URETHRAL STRICTURE DIALIATION;  Surgeon: Carolan Clines, MD;  Location: [place];  Service: Urology;  Laterality: N/A;  . SHOULDER OPEN ROTATOR CUFF REPAIR Left 09-04-2014  . skin cancer L ear     Dr [name]   . TOTAL KNEE ARTHROPLASTY Bilateral right 08-13-2008  &  left 11-14-2008  . UMBILICAL HERNIA REPAIR  06-23-2012  . ureter stretch  05/02/2019   [facility] Dr [name]    There were no vitals filed for this  visit.   Subjective Assessment - 02/19/21 0918    Subjective Patient reports "all things considered I'm doing pretty well." He is thinking that he has made Zhang progress but has now come to a standstill with his progress. He would like to discharge from Jeremiah Zhang today.  Reports his pain is between 1-2/10 depending on what he is doing. Did okay with his trip to [place]. States he has no questions about his HEP and is planning to start water exercise as soon as his wife's eye doctor approves her to participate.    Pertinent History Jeremiah Zhang is a 32yoM who reports several months of 'serious' back issues, no help after attempted chiropractic treatment. Jeremiah Zhang denies any particular contributing event.  Jeremiah Zhang has been as high at 8-9/10, but since starting new medication 1 month prior, reports pain has been improved to 5-6/10 instance, but does not have pain all the time. Jeremiah Zhang denies any pain migration, denies any pain or paresthesias of the legs. Jeremiah Zhang has had xrays done showing scoliosis, otherwise no concerning findings. Jeremiah Zhang has been taking tylenol daily for a month now. Jeremiah Zhang has aggravation of pain with lifting items 35lbs or more, also with getting in/out of bed. Remote hx bilat TKA. 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 His wife is a retired Therapist, sports who worked at [employer].    Limitations Other (comment)    Patient Stated Goals get back pain to go away and be able to complete usual activities without pain    Currently in Pain? Yes    Pain Score 1     Pain Location Back    Pain Orientation Right              OBJECTIVE FOTO = 61 (02/18/2021) 6MWT: 1544 feet with no increase in pain. Stair climb: ascent/descent 4 steps with no UE support x 22 reps (to simulate the amount needed for [facility]). Not limited by back pain.  Floor To Waist lift: increasing by 10# each rep until 50# box, then completed 5 reps ant 50# (challenging but did not cause any back pain).     TREATMENT:  Therapeutic exercise:to centralize symptoms and improve ROM, strength, muscular endurance, and activity tolerance required for successful completion of functional activities. - standing lumbar extension at plinth, 1x20 - ascent/descent 4 steps with no UE support x 22 reps (to simulate the amount needed for [facility]). Not limited by back pain.  - floor to waist lift, increasing by 10# each rep until 50# box, then completed 5 reps ant 50# (challenging but did not cause any back pain).  - standing lumbar extension at plinth, 1x20 6MWT: 1544 feet with no increase in pain. - standing lumbar extensionover plinth1x20  Jeremiah Zhang required multimodal cuing for proper technique and to facilitate improved neuromuscular control, strength, range of motion, and functional ability resulting  in improved performance and form.   HOME EXERCISE PROGRAM Access Code: [email or web address] URL: [email or web address] Date: 01/19/2021 Prepared by: Rosita Kea  Exercises Supine Lower Trunk Rotation - 1 x daily - 1 sets - 20 reps Prone Hip Extension - 1 x daily - 3 sets - 10 reps Standing hip abduction with band around ankles - 1 x daily - 3 sets - 10 reps - 1 second hold Supine Quadratus Lumborum Stretch - 1-2 x daily - 2-3 reps - 30 seconds hold Standing Lumbar Extension with Counter - 3 x weekly - 4 sets - 15 reps Runner's Step Up/Down - 3 x weekly - 3 sets - 15-20 reps  [email or web address] [id number] Home Exercise Program [[id number]]  Box lift - Dustin - Repeat 10 Times, Complete 3 Sets, Perform 3 Times a Week  Pick One:  Lumbar Extension in Standing - Repeat 15 Times, Hold 2 Seconds, Complete 1 Set, Perform 5 Times a Day  Standing lumbar extension vs counter - Dustin - Repeat 15 Times, Hold 2 Seconds, Complete 1 Set, Perform 5 Times a Day  Standing lumbar extension - Repeat 15 Times, Hold 2 Seconds, Complete 1 Set, Perform 5 Times a Day     Jeremiah Zhang Education - 02/19/21 0918     Education Details exercise technique/form. discharge reccomendations.    Person(s) Educated Patient    Methods Explanation;Demonstration    Comprehension Verbalized understanding;Returned demonstration            Jeremiah Zhang Short Term Goals - 02/19/21 0924      Jeremiah Zhang SHORT TERM GOAL #1   Title After 4 weeks Jeremiah Zhang to demonstrate improved Rt SLS >10 sec.    Baseline <3sec (11/05/2020); deferred measurement to next session (12/11/2020); 7  Seconds (01/14/2021);    Time 4    Period Weeks    Status Partially Met    Target Date 12/03/20             Jeremiah Zhang Long Term Goals - 02/18/21 1504      Jeremiah Zhang LONG TERM GOAL #1   Title After 8 weeks Jeremiah Zhang to demonstrate seated Rt hip ER strength 5/5.    Baseline 4/5    Time 8    Period Weeks    Status Achieved      Jeremiah Zhang LONG TERM GOAL #2   Title After 12 weeks Jeremiah Zhang to demonstrate improved FOTO score to >70    Baseline 40/100 at eval (11/02/2020); 55/100 at visit 10 (12/11/2020); 53/100 visit 20 (01/14/2021); 61/100 at visit # 26(02/18/2021);    Time 12    Period Weeks    Status Partially Met      Jeremiah Zhang LONG TERM GOAL #3   Title After 8 weeks Jeremiah Zhang to demonstrate 5xSTS <11 seconds to demonstrate improve function of hip/back extension.    Baseline 12 seconds from 17 inch chair with no UE support (12/11/2020); 11 seconds from 18 inch chair with no UE support (01/14/2021);    Time 8    Period Weeks    Status Achieved      Jeremiah Zhang LONG TERM GOAL #4   Title Patient will demonstrate floor to waist box lift of 50# or greater x 5 consecutive reps to demonstrate improved lifting ability for moving heavy items around the home.    Baseline Box lift floor to waist high table: up to 34.5# before he does not feel comfortable continuing due to concern for intiating increased pain and difficult overall (01/14/2021); floor to waist lift, increasing by 10# each rep until 50# box,  then completed 5 reps ant 50# (challenging but did not cause any back pain) (02/18/2021);    Time 7    Period Weeks     Status Achieved      Jeremiah Zhang LONG TERM GOAL #5   Title Patient will ascend/descend 87 steps with no increase in back pain to improve his ability to visit the [facility] or other activities that require several flights of steps.    Baseline Up/down 4 steps: increased back pain "a little" (4/10). stopped due by getting tired and winded. 12 times no UE support (48 total steps)  (01/14/2021); ascent/descent 4 steps with no UE support x 22 reps (to simulate the amount needed for [facility]). Not limited by back pain. (02/18/2021);    Time 7    Period Weeks    Status Achieved      Jeremiah Zhang LONG TERM GOAL #6   Title Patient will ambulate equal or greater than 1700 feet during 6 Minute Walk Test with no increase in back pain to demonstrate improved ability to complete community ambulation without pain.    Baseline 1500 feet pain increasing near 3 min but didn't really get any worse, felt like he was staggering the last 2 laps. Calf pain from pushing his speed especially two laps got up to went up to 3/10 (01/14/2021); 1544 feet with no increase in pain (02/18/2021);    Time 7    Period Weeks    Status Partially Met                 Plan - 02/19/21 0923    Clinical Impression Statement Patient attended 26 physical therapy sessions this episode of care and made steady progress towards goals, meeting or nearly meeting all of them and improving sufficiently for discharge. The remaining goal of the patient to be pain free was unable to be met, but he was able to keep pain below 2/10 while significantly increasing his functional capacity. Due to no improvement in pain over the last few weeks, and patient satisfaction with functional ability, patient is now discharged from Jeremiah Zhang.    Personal Factors and Comorbidities Age;Behavior Pattern;Fitness;Past/Current Experience    Examination-Activity Limitations Bathing;Locomotion Level;Bend;Carry;Sleep;Dressing;Stairs;Stand    Examination-Participation Restrictions  Community Activity;Interpersonal Relationship;Yard Work    Stability/Clinical Decision Making Stable/Uncomplicated    Rehab Potential Zhang    Jeremiah Zhang Frequency 2x / week    Jeremiah Zhang Duration 12 weeks    Jeremiah Zhang Treatment/Interventions ADLs/Self Care Home Management;Gait training;Stair training;Joint Manipulations;Dry needling;Passive range of motion;Patient/family education;Therapeutic exercise;Balance training;Neuromuscular re-education;DME Instruction;Moist Heat;Electrical Stimulation    Jeremiah Zhang Next Visit Plan Patient is now discharged from [place] Access Code: [email or web address] ; [email or web address]  [id number] Home Exercise Program ([id number])    Consulted and Agree with Plan of Care Patient           Patient will benefit from skilled therapeutic intervention in order to improve the following deficits and impairments:  Abnormal gait,Decreased range of motion,Difficulty walking,Increased muscle spasms,Decreased activity tolerance,Decreased knowledge of precautions,Decreased balance,Decreased mobility,Decreased strength,Improper body mechanics  Visit Diagnosis: Chronic right-sided low back pain without sciatica  Stiffness of right hip, not elsewhere classified     Problem List Patient Active Problem List   Diagnosis Date Noted  . Personal history of other malignant neoplasm of skin 01/02/2020  . GERD (gastroesophageal reflux disease) 08/28/2019  . Hyperglycemia 08/28/2019  . Hyperlipidemia 08/28/2019  . Hypertension 08/28/2019  . Obesity 08/28/2019  . Myalgia due to statin 12/28/2018  .  Erectile dysfunction following radiation therapy 08/10/2018  . Hypogonadism in male 05/11/2018  . Diabetes mellitus without complication ([place]) 41/75/3010  . Prostate cancer ([place]) 07/14/2016  . Status post rotator cuff repair 01/05/2015  . Encounter for long-term (current) use of other medications 01/15/2014  . Benign prostatic hyperplasia without lower urinary tract symptoms 06/27/2012    Everlean Alstrom.  Jeremiah Zhang, Jeremiah Zhang, Jeremiah Zhang 02/19/21, 9:26 AM  [place] [facility] [address], [place], [address] Phone: [phone]   Fax:  [phone]  Name: Jeremiah Zhang MRN: [id number] Date of Birth: Nov 15, 1945

## 2021-02-18 NOTE — Therapy (Deleted)
[place] [facility] [address], [place], [address] Phone: [phone]   Fax:  [phone]  Patient Details  Name: Jeremiah Zhang MRN: [id number] Date of Birth: August 09, 1946 Referring Provider:  [name], MD  Encounter Date: 02/18/2021   Nancy Nordmann 02/18/2021, 2:51 PM  [place] [facility] [address], [place], [address] Phone: [phone]   Fax:  [phone]

## 2021-02-19 ENCOUNTER — Encounter: Payer: Self-pay | Admitting: Physical Therapy

## 2021-02-23 ENCOUNTER — Ambulatory Visit: Payer: PPO | Admitting: Physical Therapy

## 2021-02-25 ENCOUNTER — Ambulatory Visit: Payer: PPO | Admitting: Physical Therapy

## 2021-03-02 ENCOUNTER — Encounter: Payer: BC Managed Care – PPO | Admitting: Physical Therapy

## 2021-03-04 ENCOUNTER — Encounter: Payer: BC Managed Care – PPO | Admitting: Physical Therapy

## 2021-03-10 DIAGNOSIS — H5203 Hypermetropia, bilateral: Secondary | ICD-10-CM | POA: Diagnosis not present

## 2021-03-10 DIAGNOSIS — H524 Presbyopia: Secondary | ICD-10-CM | POA: Diagnosis not present

## 2021-03-10 DIAGNOSIS — H43813 Vitreous degeneration, bilateral: Secondary | ICD-10-CM | POA: Diagnosis not present

## 2021-03-10 DIAGNOSIS — H2513 Age-related nuclear cataract, bilateral: Secondary | ICD-10-CM | POA: Diagnosis not present

## 2021-03-10 DIAGNOSIS — D3132 Benign neoplasm of left choroid: Secondary | ICD-10-CM | POA: Diagnosis not present

## 2021-03-10 DIAGNOSIS — Z7984 Long term (current) use of oral hypoglycemic drugs: Secondary | ICD-10-CM | POA: Diagnosis not present

## 2021-03-10 DIAGNOSIS — D3131 Benign neoplasm of right choroid: Secondary | ICD-10-CM | POA: Diagnosis not present

## 2021-03-10 DIAGNOSIS — H52223 Regular astigmatism, bilateral: Secondary | ICD-10-CM | POA: Diagnosis not present

## 2021-03-10 DIAGNOSIS — E119 Type 2 diabetes mellitus without complications: Secondary | ICD-10-CM | POA: Diagnosis not present

## 2021-03-19 DIAGNOSIS — G4733 Obstructive sleep apnea (adult) (pediatric): Secondary | ICD-10-CM | POA: Diagnosis not present

## 2021-03-26 DIAGNOSIS — G4733 Obstructive sleep apnea (adult) (pediatric): Secondary | ICD-10-CM | POA: Diagnosis not present

## 2021-04-18 DIAGNOSIS — G4733 Obstructive sleep apnea (adult) (pediatric): Secondary | ICD-10-CM | POA: Diagnosis not present

## 2021-04-30 ENCOUNTER — Other Ambulatory Visit: Payer: Self-pay

## 2021-04-30 ENCOUNTER — Other Ambulatory Visit: Payer: PPO

## 2021-04-30 DIAGNOSIS — E291 Testicular hypofunction: Secondary | ICD-10-CM

## 2021-05-01 ENCOUNTER — Telehealth: Payer: Self-pay | Admitting: Urology

## 2021-05-01 LAB — TESTOSTERONE: Testosterone: 230 ng/dL — ABNORMAL LOW (ref 264–916)

## 2021-05-01 LAB — HEMATOCRIT: Hematocrit: 41.2 % (ref 37.5–51.0)

## 2021-05-01 LAB — PSA: Prostate Specific Ag, Serum: <0.1 ng/mL (ref 0.0–4.0)

## 2021-05-01 NOTE — Telephone Encounter (Signed)
Notified patient as instructed, patient does not apply gel regularly.

## 2021-05-01 NOTE — Telephone Encounter (Signed)
PSA remains undetectable at <0.1.  Hematocrit was normal.  Testosterone level was low at 230.  Has he been applying the gel regularly and if so does he want to increase the dose?

## 2021-05-05 ENCOUNTER — Other Ambulatory Visit: Payer: Self-pay

## 2021-05-05 ENCOUNTER — Ambulatory Visit (INDEPENDENT_AMBULATORY_CARE_PROVIDER_SITE_OTHER): Payer: PPO | Admitting: Podiatry

## 2021-05-05 ENCOUNTER — Encounter: Payer: Self-pay | Admitting: Podiatry

## 2021-05-05 DIAGNOSIS — M79674 Pain in right toe(s): Secondary | ICD-10-CM | POA: Diagnosis not present

## 2021-05-05 DIAGNOSIS — M79675 Pain in left toe(s): Secondary | ICD-10-CM

## 2021-05-05 DIAGNOSIS — E1149 Type 2 diabetes mellitus with other diabetic neurological complication: Secondary | ICD-10-CM

## 2021-05-05 DIAGNOSIS — B351 Tinea unguium: Secondary | ICD-10-CM

## 2021-05-08 DIAGNOSIS — S41112A Laceration without foreign body of left upper arm, initial encounter: Secondary | ICD-10-CM | POA: Diagnosis not present

## 2021-05-08 NOTE — Progress Notes (Signed)
Subjective: 75 y.o. returns the office today for painful, elongated, thickened toenails which he cannot trim himself.  He is interested in having his second toenails removed on both feet as they get thick and mycotic in the skin.  No redness or drainage or signs of infection noted.  Denies any systemic complaints such as fevers, chills, nausea, vomiting.   PCP: [name], MD  Last A1c was 7.9 on 01/15/2021  Objective: AAO 3, NAD DP/PT pulses palpable, CRT less than 3 seconds Nails hypertrophic, dystrophic, elongated, brittle, discolored 10. There is tenderness overlying the nails 1-5 bilaterally. There is no surrounding erythema or drainage along the nail sites.  Second digit nails and was hypertrophic and dystrophic.  No open lesions. Bunion present however not causing any pain currently. No pain with calf compression, swelling, warmth, erythema.  Assessment: Patient presents with symptomatic onychomycosis  Plan: -Treatment options including alternatives, risks, complications were discussed -Nails sharply debrided 10 without complication/bleeding.   -Discussed toenail avulsion bilateral second toenails.  Discussed the procedures of postoperative course.  He wants to have this done but he is going out of town planned and the skin gets back. -Discussed daily foot inspection. If there are any changes, to call the office immediately.  -Follow-up in 3 months or sooner if any problems are to arise. In the meantime, encouraged to call the office with any questions, concerns, changes symptoms.  Celesta Gentile, DPM

## 2021-05-25 DIAGNOSIS — U071 COVID-19: Secondary | ICD-10-CM | POA: Diagnosis not present

## 2021-05-25 DIAGNOSIS — Z20822 Contact with and (suspected) exposure to covid-19: Secondary | ICD-10-CM | POA: Diagnosis not present

## 2021-06-08 DIAGNOSIS — R5383 Other fatigue: Secondary | ICD-10-CM | POA: Diagnosis not present

## 2021-06-08 DIAGNOSIS — E1122 Type 2 diabetes mellitus with diabetic chronic kidney disease: Secondary | ICD-10-CM | POA: Diagnosis not present

## 2021-06-08 DIAGNOSIS — N1831 Chronic kidney disease, stage 3a: Secondary | ICD-10-CM | POA: Diagnosis not present

## 2021-06-08 DIAGNOSIS — Z9989 Dependence on other enabling machines and devices: Secondary | ICD-10-CM | POA: Diagnosis not present

## 2021-06-08 DIAGNOSIS — G4733 Obstructive sleep apnea (adult) (pediatric): Secondary | ICD-10-CM | POA: Diagnosis not present

## 2021-06-10 ENCOUNTER — Ambulatory Visit (INDEPENDENT_AMBULATORY_CARE_PROVIDER_SITE_OTHER): Payer: PPO | Admitting: Dermatology

## 2021-06-10 ENCOUNTER — Encounter: Payer: Self-pay | Admitting: Dermatology

## 2021-06-10 ENCOUNTER — Other Ambulatory Visit: Payer: Self-pay

## 2021-06-10 DIAGNOSIS — Z872 Personal history of diseases of the skin and subcutaneous tissue: Secondary | ICD-10-CM | POA: Diagnosis not present

## 2021-06-10 DIAGNOSIS — D492 Neoplasm of unspecified behavior of bone, soft tissue, and skin: Secondary | ICD-10-CM

## 2021-06-10 DIAGNOSIS — W19XXXA Unspecified fall, initial encounter: Secondary | ICD-10-CM | POA: Diagnosis not present

## 2021-06-10 DIAGNOSIS — L570 Actinic keratosis: Secondary | ICD-10-CM | POA: Diagnosis not present

## 2021-06-10 DIAGNOSIS — L578 Other skin changes due to chronic exposure to nonionizing radiation: Secondary | ICD-10-CM | POA: Diagnosis not present

## 2021-06-10 DIAGNOSIS — S59902A Unspecified injury of left elbow, initial encounter: Secondary | ICD-10-CM

## 2021-06-10 NOTE — Patient Instructions (Addendum)

## 2021-06-10 NOTE — Progress Notes (Signed)
   Follow-Up Visit   Subjective  Jeremiah Zhang is a 75 y.o. male who presents for the following: Actinic Keratosis (6 months f/u hx of Aks on the face and scalp ). Pt fell 6 weeks ago now his elbow is swollen pt think it could be infected.   The following portions of the chart were reviewed this encounter and updated as appropriate:   Tobacco  Allergies  Meds  Problems  Med Hx  Surg Hx  Fam Hx     Review of Systems:  No other skin or systemic complaints except as noted in HPI or Assessment and Plan.  Objective  Well appearing patient in no apparent distress; mood and affect are within normal limits.  A focused examination was performed including face,scalp,left elbow. Relevant physical exam findings are noted in the Assessment and Plan.  scalp, forehead (16) Erythematous thin papules/macules with gritty scale.   left elbow Fluctuation and fluid, no infection noticed   Assessment & Plan  AK (actinic keratosis) (16) scalp, forehead  Destruction of lesion - scalp, forehead Complexity: simple   Destruction method: cryotherapy   Informed consent: discussed and consent obtained   Timeout:  patient name, date of birth, surgical site, and procedure verified Lesion destroyed using liquid nitrogen: Yes   Region frozen until ice ball extended beyond lesion: Yes   Outcome: patient tolerated procedure well with no complications   Post-procedure details: wound care instructions given    Actinic Damage - chronic, secondary to cumulative UV radiation exposure/sun exposure over time - diffuse scaly erythematous macules with underlying dyspigmentation - Recommend daily broad spectrum sunscreen SPF 30+ to sun-exposed areas, reapply every 2 hours as needed.  - Recommend staying in the shade or wearing long sleeves, sun glasses (UVA+UVB protection) and wide brim hats (4-inch brim around the entire circumference of the hat). - Call for new or changing lesions.  Traumatic injury of Left  elbow with fluid on elbow No evidence of infection today. left elbow Recommend consult with PCP or orthopedic doctor  Return in about 6 months (around 12/11/2021) for AKs.  [name], CMA, am acting as scribe for [name], MD .  Documentation: I have reviewed the above documentation for accuracy and completeness, and I agree with the above.  [name], MD

## 2021-06-18 DIAGNOSIS — M10229 Drug-induced gout, unspecified elbow: Secondary | ICD-10-CM | POA: Diagnosis not present

## 2021-06-18 DIAGNOSIS — M7022 Olecranon bursitis, left elbow: Secondary | ICD-10-CM | POA: Diagnosis not present

## 2021-06-19 DIAGNOSIS — G4733 Obstructive sleep apnea (adult) (pediatric): Secondary | ICD-10-CM | POA: Diagnosis not present

## 2021-06-26 DIAGNOSIS — G4733 Obstructive sleep apnea (adult) (pediatric): Secondary | ICD-10-CM | POA: Diagnosis not present

## 2021-06-29 ENCOUNTER — Other Ambulatory Visit: Payer: Self-pay | Admitting: Family Medicine

## 2021-06-29 DIAGNOSIS — M7022 Olecranon bursitis, left elbow: Secondary | ICD-10-CM | POA: Diagnosis not present

## 2021-07-03 MED ORDER — TESTOSTERONE 20.25 MG/ACT (1.62%) TD GEL: TRANSDERMAL | 3 refills | Status: DC

## 2021-07-07 DIAGNOSIS — Z6833 Body mass index (BMI) 33.0-33.9, adult: Secondary | ICD-10-CM | POA: Diagnosis not present

## 2021-07-07 DIAGNOSIS — C61 Malignant neoplasm of prostate: Secondary | ICD-10-CM | POA: Diagnosis not present

## 2021-07-07 DIAGNOSIS — K921 Melena: Secondary | ICD-10-CM | POA: Diagnosis not present

## 2021-07-07 DIAGNOSIS — E669 Obesity, unspecified: Secondary | ICD-10-CM | POA: Diagnosis not present

## 2021-07-07 DIAGNOSIS — Z800 Family history of malignant neoplasm of digestive organs: Secondary | ICD-10-CM | POA: Diagnosis not present

## 2021-07-07 DIAGNOSIS — I10 Essential (primary) hypertension: Secondary | ICD-10-CM | POA: Diagnosis not present

## 2021-07-07 DIAGNOSIS — K219 Gastro-esophageal reflux disease without esophagitis: Secondary | ICD-10-CM | POA: Diagnosis not present

## 2021-07-17 DIAGNOSIS — M7022 Olecranon bursitis, left elbow: Secondary | ICD-10-CM | POA: Diagnosis not present

## 2021-07-19 DIAGNOSIS — G4733 Obstructive sleep apnea (adult) (pediatric): Secondary | ICD-10-CM | POA: Diagnosis not present

## 2021-07-27 DIAGNOSIS — M5136 Other intervertebral disc degeneration, lumbar region: Secondary | ICD-10-CM | POA: Diagnosis not present

## 2021-07-28 DIAGNOSIS — E782 Mixed hyperlipidemia: Secondary | ICD-10-CM | POA: Diagnosis not present

## 2021-07-28 DIAGNOSIS — C61 Malignant neoplasm of prostate: Secondary | ICD-10-CM | POA: Diagnosis not present

## 2021-07-28 DIAGNOSIS — Z79899 Other long term (current) drug therapy: Secondary | ICD-10-CM | POA: Diagnosis not present

## 2021-07-28 DIAGNOSIS — E291 Testicular hypofunction: Secondary | ICD-10-CM | POA: Diagnosis not present

## 2021-07-28 DIAGNOSIS — N1831 Chronic kidney disease, stage 3a: Secondary | ICD-10-CM | POA: Diagnosis not present

## 2021-07-28 DIAGNOSIS — E1122 Type 2 diabetes mellitus with diabetic chronic kidney disease: Secondary | ICD-10-CM | POA: Diagnosis not present

## 2021-07-28 DIAGNOSIS — E1165 Type 2 diabetes mellitus with hyperglycemia: Secondary | ICD-10-CM | POA: Diagnosis not present

## 2021-07-28 DIAGNOSIS — I10 Essential (primary) hypertension: Secondary | ICD-10-CM | POA: Diagnosis not present

## 2021-08-10 ENCOUNTER — Other Ambulatory Visit: Payer: Self-pay

## 2021-08-10 ENCOUNTER — Encounter: Payer: Self-pay | Admitting: Podiatry

## 2021-08-10 ENCOUNTER — Ambulatory Visit (INDEPENDENT_AMBULATORY_CARE_PROVIDER_SITE_OTHER): Payer: HMO | Admitting: Podiatry

## 2021-08-10 ENCOUNTER — Ambulatory Visit (INDEPENDENT_AMBULATORY_CARE_PROVIDER_SITE_OTHER): Payer: HMO

## 2021-08-10 DIAGNOSIS — M79674 Pain in right toe(s): Secondary | ICD-10-CM

## 2021-08-10 DIAGNOSIS — M2022 Hallux rigidus, left foot: Secondary | ICD-10-CM

## 2021-08-10 DIAGNOSIS — M79675 Pain in left toe(s): Secondary | ICD-10-CM | POA: Diagnosis not present

## 2021-08-10 DIAGNOSIS — E1149 Type 2 diabetes mellitus with other diabetic neurological complication: Secondary | ICD-10-CM

## 2021-08-10 DIAGNOSIS — B351 Tinea unguium: Secondary | ICD-10-CM

## 2021-08-10 DIAGNOSIS — M2062 Acquired deformities of toe(s), unspecified, left foot: Secondary | ICD-10-CM

## 2021-08-10 DIAGNOSIS — M2021 Hallux rigidus, right foot: Secondary | ICD-10-CM | POA: Diagnosis not present

## 2021-08-16 NOTE — Progress Notes (Signed)
Subjective: 75 y.o. returns the office today for painful, elongated, thickened toenails which he cannot trim himself.  We previously discussed nail avulsions of the toenails and was discussed this versus other options today.  Denies any redness or drainage  tothe toenail sites.  He states the nails are weird shaped.  He states he did get some numbness sensation to his left big toe.  No significant pain.  No recent injury.  PCP: [name], MD  Last A1c was 8.1 on 07/28/2021  Objective: AAO 3, NAD DP/PT pulses palpable, CRT less than 3 seconds Nails hypertrophic, dystrophic, elongated, brittle, discolored 10. There is tenderness overlying the nails 1-5 bilaterally. There is no surrounding erythema or drainage along the nail sites.  Second digit nails remain hypertrophic and dystrophic.  No open lesions. Decreased under motion of the first MPJ, bunions.  No significant pain.  Describes some numbness sensation of the left bunion area.  No radiating pain or weakness otherwise. No pain with calf compression, swelling, warmth, erythema.  Assessment: Patient presents with symptomatic onychomycosis; onychodystrophy second digit toenails; arthritis first MPJs bilaterally  Plan: -Treatment options including alternatives, risks, complications were discussed -Nails sharply debrided 10 without complication/bleeding.  Daily discussion regards to treatment options for the nails in particular secondary to tenderness.  Discussed nail removal versus conservative care.  After discussion was held to proceed and continue with conservative treatment.  Discussed urea nail gel to help with the thickening. -X-rays obtained reviewed.  Significant arthritic changes present the first MPJs bilaterally.  No evidence of acute fracture. -Numbness could be likely due to localized nerve compression given the arthritis and bunion.  Also monitor for any worsening for concerns of possible neuropathy.  As it could also be  from the neuropathy.  Trula Slade DPM    -Discussed toenail avulsion bilateral second toenails.  Discussed the procedures of postoperative course.  He wants to have this done but he is going out of town planned and the skin gets back. -Discussed daily foot inspection. If there are any changes, to call the office immediately.  -Follow-up in 3 months or sooner if any problems are to arise. In the meantime, encouraged to call the office with any questions, concerns, changes symptoms.  Celesta Gentile, DPM

## 2021-08-19 DIAGNOSIS — G4733 Obstructive sleep apnea (adult) (pediatric): Secondary | ICD-10-CM | POA: Diagnosis not present

## 2021-09-09 DIAGNOSIS — M7022 Olecranon bursitis, left elbow: Secondary | ICD-10-CM | POA: Diagnosis not present

## 2021-09-18 DIAGNOSIS — G4733 Obstructive sleep apnea (adult) (pediatric): Secondary | ICD-10-CM | POA: Diagnosis not present

## 2021-10-09 ENCOUNTER — Encounter: Payer: Self-pay | Admitting: Gastroenterology

## 2021-10-13 ENCOUNTER — Encounter: Admission: RE | Payer: Self-pay | Source: Home / Self Care

## 2021-10-13 ENCOUNTER — Ambulatory Visit
Admission: RE | Admit: 2021-10-13 | Payer: BC Managed Care – PPO | Source: Home / Self Care | Admitting: Gastroenterology

## 2021-10-13 DIAGNOSIS — M7022 Olecranon bursitis, left elbow: Secondary | ICD-10-CM | POA: Diagnosis not present

## 2021-10-13 DIAGNOSIS — M25429 Effusion, unspecified elbow: Secondary | ICD-10-CM | POA: Diagnosis not present

## 2021-10-13 SURGERY — COLONOSCOPY
Anesthesia: General

## 2021-10-14 ENCOUNTER — Other Ambulatory Visit: Payer: Self-pay

## 2021-10-14 DIAGNOSIS — E291 Testicular hypofunction: Secondary | ICD-10-CM

## 2021-10-15 ENCOUNTER — Encounter: Payer: Self-pay | Admitting: Family Medicine

## 2021-11-02 ENCOUNTER — Other Ambulatory Visit: Payer: Self-pay

## 2021-11-02 ENCOUNTER — Other Ambulatory Visit: Payer: Medicare PPO

## 2021-11-02 DIAGNOSIS — E291 Testicular hypofunction: Secondary | ICD-10-CM

## 2021-11-03 LAB — HEMATOCRIT: Hematocrit: 45.3 % (ref 37.5–51.0)

## 2021-11-03 LAB — PSA: Prostate Specific Ag, Serum: <0.1 ng/mL (ref 0.0–4.0)

## 2021-11-03 LAB — TESTOSTERONE: Testosterone: 255 ng/dL — ABNORMAL LOW (ref 264–916)

## 2021-11-05 ENCOUNTER — Ambulatory Visit: Payer: Self-pay | Admitting: Urology

## 2021-11-06 ENCOUNTER — Ambulatory Visit (INDEPENDENT_AMBULATORY_CARE_PROVIDER_SITE_OTHER): Payer: Medicare PPO | Admitting: Urology

## 2021-11-06 ENCOUNTER — Other Ambulatory Visit: Payer: Self-pay

## 2021-11-06 ENCOUNTER — Encounter: Payer: Self-pay | Admitting: Urology

## 2021-11-06 VITALS — BP 132/84 | HR 76 | Ht 69.0 in | Wt 219.0 lb

## 2021-11-06 DIAGNOSIS — Z8546 Personal history of malignant neoplasm of prostate: Secondary | ICD-10-CM

## 2021-11-06 DIAGNOSIS — E291 Testicular hypofunction: Secondary | ICD-10-CM

## 2021-11-06 MED ORDER — TESTOSTERONE 20.25 MG/ACT (1.62%) TD GEL: TRANSDERMAL | 1 refills | Status: DC

## 2021-11-06 NOTE — Progress Notes (Signed)
11/06/2021 10:23 AM   Jeremiah Zhang 05-16-1946 [id number]  Referring provider: [name], MD [phone] [facility] [place],  [place] [address]  Chief Complaint  Patient presents with   Hypogonadism    Urologic history: 1.  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  Hypogonadism -Started TRT July 2019   3.  Erectile dysfunction  HPI: 76 y.o. male presents for annual follow-up.  Labs 11/02/2021: Testosterone level 255 ng/dL; PSA <0.1; HCT 45.3 +/- Tiredness fatigue No bothersome LUTS No dysuria gross hematuria No flank, abdominal or pelvic pain   PMH: Past Medical History:  Diagnosis Date   Actinic keratosis    Anticoagulant long-term use    ELIQUIS   Arthritis    Basal cell carcinoma 01/27/2010   Right spinal upper back. Superficial.    Basal cell carcinoma 11/15/2019   Left inferior antihelix post. to tragus. Superficial and nodular patterns. Excised: 11/27/2019   Basal cell carcinoma 11/15/2019   Left mid lateral nose. Nodular and infiltrative patterns. Excised: 11/27/2019   Basal cell carcinoma 11/27/2019   Left mid superior helix. BCC with focal sclerosis.   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 Slight atypia. Edges free.   ED (erectile dysfunction)    GERD (gastroesophageal reflux disease)    Hiatal hernia    History of DVT of lower extremity    POST OP KNEE SURGERY   History of transient ischemic attack (TIA)    PER PT HX  ?POSSIBLE-- PT ON ELIQUIS   Hyperlipidemia    Hypertension    Hypogonadism male    OSA (obstructive sleep apnea)    per pt positive sleep study yrs ago recommended CPAP --  pt Non-  compliant -- states uses Breath Rite Nasal Strips   Peripheral neuropathy    Prostate cancer [facility]) urologist-  dr [name]  oncologist-- dr [name]   dx 03-25-2015  T1c, Gleason 4+3  s/p  cryoablation prostate 06-02-2015/  external  radiation therapy 08-16-2016 to 09-20-2016/  scheduled for radioactive seed implants 10-22-2016   TIA (transient ischemic attack)    ?? 08/20/2014? Eliquis 07/02/15   Type 2 diabetes mellitus ([place])    followed by dr [name] [facility] clinic)  last A1c 8.4 on 07-27-2016 per lov note   Umbilical hernia    Wears glasses    Wears hearing aid    bilateral    Surgical History: Past Surgical History:  Procedure Laterality Date   COLONOSCOPY WITH PROPOFOL N/A 05/30/2017   Procedure: COLONOSCOPY WITH PROPOFOL;  Surgeon: [name], MD;  Location: [facility] ENDOSCOPY;  Service: Endoscopy;  Laterality: N/A;   CRYOABLATION N/A 06/02/2015   Procedure: CRYO ABLATION PROSTATE;  Surgeon: Carolan Clines, MD;  Location[facility];  Service: Urology;  Laterality: N/A;   CYSTOSCOPY WITH URETHRAL DILATATION N/A 05/04/2019   Procedure: CYSTOSCOPY WITH URETHRAL DILATATION;  Surgeon: [name], MD;  Location: [facility];  Service: Urology;  Laterality: N/A;   ELBOW BURSA SURGERY Left Feb 1997   prostate biopsies     x12, cryotherapy   RADIOACTIVE SEED IMPLANT N/A 10/22/2016   Procedure: RADIOACTIVE SEED IMPLANT/BRACHYTHERAPY IMPLANT, URETHRAL STRICTURE DIALIATION;  Surgeon: Carolan Clines, MD;  Location: [place];  Service: Urology;  Laterality: N/A;   SHOULDER OPEN ROTATOR CUFF REPAIR Left 09-04-2014   skin cancer L ear     Dr [name]    TOTAL KNEE ARTHROPLASTY Bilateral right 08-13-2008  &  left [id number]   UMBILICAL HERNIA REPAIR  06-23-2012   ureter stretch  05/02/2019   [facility] Dr [name]    Home Medications:  Allergies as of 11/06/2021       Reactions   Bee Venom Anaphylaxis   Crestor  [rosuvastatin] Other (See Comments)   "muscle weakness and discomfort"   Vytorin [ezetimibe-simvastatin] Other (See Comments)   "muscle weakness and discomfort"   Dynapen [dicloxacillin] Rash        Medication List        Accurate as of November 06, 2021 10:23 AM. If you have any questions, ask your nurse or doctor.          Accu-Chek FastClix Lancets Misc   ADVIL PO Take 2 tablets by mouth as needed.   amLODipine 5 MG tablet Commonly known as: NORVASC TAKE 1 TABLET(5 MG) BY MOUTH EVERY DAY   amLODipine 5 MG tablet Commonly known as: NORVASC Take by mouth.   amoxicillin 875 MG tablet Commonly known as: AMOXIL SMARTSIG:1 Tablet(s) By Mouth Every 12 Hours   apixaban 5 MG Tabs tablet Commonly known as: ELIQUIS TAKE 1 TABLET(5 MG) BY MOUTH TWICE DAILY   ascorbic acid 500 MG tablet Commonly known as: VITAMIN C Take 1,000 mg by mouth daily.   bisoprolol-hydrochlorothiazide 10-6.25 MG tablet Commonly known as: ZIAC TAKE 1 TABLET BY MOUTH EVERY DAY   EPINEPHrine 0.3 mg/0.3 mL Soaj injection Commonly known as: EPI-PEN Inject into the muscle as needed.   EPINEPHrine 0.3 mg/0.3 mL Soaj injection Commonly known as: EPI-PEN INJECT 0.3 MG INTO THE MUSCLE ONCE AS NEEDED FOR ANAPHYLAXIS   esomeprazole 40 MG capsule Commonly known as: NEXIUM Take 40 mg by mouth every evening.   EXCEDRIN PO Take 2-3 tablets by mouth. Rarely as needed for headache. Once in 3 mo max. +2 more in 1/2 if a severe headache.   Flowflex COVID-19 Ag Home Test Kit Generic drug: COVID-19 At Home Antigen Test TEST AS DIRECTED TODAY   fluticasone 50 MCG/ACT nasal spray Commonly known as: FLONASE Place into both nostrils.   furosemide 40 MG tablet Commonly known as: LASIX Take 40 mg by mouth daily.   glimepiride 2 MG tablet Commonly known as: AMARYL TAKE 1 TABLET BY MOUTH TWICE DAILY   glucose blood test strip TEST TWICE DAILY AS INSTRUCTED   metFORMIN 1000 MG tablet Commonly known  as: GLUCOPHAGE Take by mouth.   metFORMIN 1000 MG tablet Commonly known as: GLUCOPHAGE TAKE 1 TABLET(1000 MG) BY MOUTH TWICE DAILY WITH MEALS   Na Sulfate-K Sulfate-Mg Sulf 17.5-3.13-1.6 GM/177ML Soln See admin instructions.   Procysbi 300 MG Pack Generic drug: Cysteamine Bitartrate Use 1 each 2 (two) times daily Use as instructed.   quinapril 20 MG tablet Commonly known as: ACCUPRIL TAKE 1 TABLET BY MOUTH TWICE DAILY   RED YEAST RICE PO Take by mouth 2 (two) times daily.   sitaGLIPtin 100 MG tablet Commonly known as: JANUVIA Take by mouth.   Testosterone 20.25 MG/ACT (1.62%) Gel Apply 1 pump to each shoulder daily   tiZANidine 2 MG tablet Commonly known as: ZANAFLEX Take 2 mg by mouth 3 (three) times daily.   TYLENOL PO Take 2-3 tablets by mouth as needed.   Vitamin D3 50  MCG (2000 UT) Tabs Take 1 capsule by mouth daily.        Allergies:  Allergies  Allergen Reactions   Bee Venom Anaphylaxis   Crestor [Rosuvastatin] Other (See Comments)    "muscle weakness and discomfort"   Vytorin [Ezetimibe-Simvastatin] Other (See Comments)    "muscle weakness and discomfort"   Dynapen [Dicloxacillin] Rash    Family History: Family History  Problem Relation Age of Onset   Cancer Mother 22       colon/resection    Social History:  reports that he has never smoked. He has never used smokeless tobacco. He reports that he does not drink alcohol and does not use drugs.   Physical Exam: BP 132/84    Pulse 76    Ht _0  (1.753 m)    Wt 219 lb (99.3 kg)    BMI 32.34 kg/m   Constitutional:  Alert and oriented, No acute distress. HEENT: [place] AT, moist mucus membranes.  Trachea midline, no masses. Cardiovascular: No clubbing, cyanosis, or edema. Respiratory: Normal respiratory effort, no increased work of breathing. GI: Abdomen is soft, nontender, nondistended, no abdominal masses GU: No CVA tenderness Skin: No rashes, bruises or suspicious lesions. Neurologic: Grossly  intact, no focal deficits, moving all 4 extremities. Psychiatric: Normal mood and affect.   Assessment & Plan:    1.  Hypogonadism Levels the last 2 years have been consistently <300 He was interested in increasing dose to 3 pumps total per day Lab visit 6 months testosterone, hematocrit Continue annual follow-up testosterone, PSA, hematocrit  2.  History of prostate cancer PSA remains undetectable   Abbie Sons, MD  [facility] [address], [place], [place] [address] [phone]

## 2021-11-10 ENCOUNTER — Other Ambulatory Visit: Payer: Self-pay

## 2021-11-10 ENCOUNTER — Ambulatory Visit (INDEPENDENT_AMBULATORY_CARE_PROVIDER_SITE_OTHER): Payer: Medicare PPO | Admitting: Podiatry

## 2021-11-10 DIAGNOSIS — B351 Tinea unguium: Secondary | ICD-10-CM

## 2021-11-10 DIAGNOSIS — E1149 Type 2 diabetes mellitus with other diabetic neurological complication: Secondary | ICD-10-CM | POA: Diagnosis not present

## 2021-11-10 DIAGNOSIS — M79674 Pain in right toe(s): Secondary | ICD-10-CM

## 2021-11-10 DIAGNOSIS — M79675 Pain in left toe(s): Secondary | ICD-10-CM | POA: Diagnosis not present

## 2021-11-11 ENCOUNTER — Telehealth: Payer: Self-pay | Admitting: Family Medicine

## 2021-11-11 NOTE — Telephone Encounter (Signed)
Patient notified Testosterone Gel has been approved Approval date: 11/11/2021-12/312023

## 2021-11-16 NOTE — Progress Notes (Signed)
Subjective: 76 y.o. returns the office today for painful, elongated, thickened toenails which he cannot trim himself.  He denies any open sores.  No recent injury or changes otherwise since I last saw him.  He has no new concerns today.  PCP: [name], MD Seen July 27, 2021  Last A1c was 8.1 on 07/28/2021  Objective: AAO 3, NAD DP/PT pulses palpable, CRT less than 3 seconds Nails hypertrophic, dystrophic, elongated, brittle, discolored 10. There is tenderness overlying the nails 1-5 bilaterally. There is no surrounding erythema or drainage along the nail sites.  Second digit nails remain hypertrophic and dystrophic.  No open lesions. Decreased under motion of the first MPJ, bunions.  No significant pain.   No pain with calf compression, swelling, warmth, erythema.  Assessment: Patient presents with symptomatic onychomycosis  Plan: -Treatment options including alternatives, risks, complications were discussed -Nails sharply debrided 10 without complication/bleeding. -Discussed daily foot inspection, glucose control.  Trula Slade DPM

## 2021-12-10 ENCOUNTER — Ambulatory Visit: Payer: BC Managed Care – PPO | Admitting: Dermatology

## 2021-12-17 DIAGNOSIS — G4733 Obstructive sleep apnea (adult) (pediatric): Secondary | ICD-10-CM | POA: Diagnosis not present

## 2021-12-23 DIAGNOSIS — G4733 Obstructive sleep apnea (adult) (pediatric): Secondary | ICD-10-CM | POA: Diagnosis not present

## 2021-12-27 ENCOUNTER — Encounter: Payer: Self-pay | Admitting: Gastroenterology

## 2021-12-27 NOTE — H&P (Signed)
Pre-Procedure H&P   Patient ID: Jeremiah Zhang is a 76 y.o. male.  Gastroenterology Provider: Annamaria Helling, DO  PCP: [name], MD  Date: 12/28/2021  HPI Jeremiah Zhang is a 76 y.o. male who presents today for Colonoscopy for Surveillance colonoscopy-family history of colon cancer (mother~75). Patient typically has 1 regular well-formed bowel movement per day he has occasionally noted blood streaked on his stool but very rarely.  No melena diarrhea or constipation.  No other acute GI complaints He is on Eliquis for DVT history which has been held for this procedure. Last dose Friday night Personal history of prostate cancer status post cryotherapy and radiation Mother with history of colon cancer around age 74 Most recent lab work hemoglobin 13.6 MCV 92 platelets 275,000 creatinine 1.4 Status post bilateral knee replacement  Last colonoscopy 2018 noting internal hemorrhoids and diverticulosis  Past Medical History:  Diagnosis Date   Actinic keratosis    Anticoagulant long-term use    ELIQUIS   Arthritis    Basal cell carcinoma 01/27/2010   Right spinal upper back. Superficial.    Basal cell carcinoma 11/15/2019   Left inferior antihelix post. to tragus. Superficial and nodular patterns. Excised: 11/27/2019   Basal cell carcinoma 11/15/2019   Left mid lateral nose. Nodular and infiltrative patterns. Excised: 11/27/2019   Basal cell carcinoma 11/27/2019   Left mid superior helix. BCC with focal sclerosis.   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 Slight atypia. Edges free.   ED (erectile dysfunction)    GERD (gastroesophageal reflux disease)    Hiatal hernia    History of DVT of lower extremity    POST OP KNEE SURGERY   History of transient ischemic attack (TIA)    PER PT HX  ?POSSIBLE-- PT ON ELIQUIS   Hyperlipidemia    Hypertension    Hypogonadism male    OSA (obstructive sleep  apnea)    per pt positive sleep study yrs ago recommended CPAP --  pt Non- compliant -- states uses Breath Rite Nasal Strips   Peripheral neuropathy    Prostate cancer [facility]) urologist-  dr [name]  oncologist-- dr [name]   dx 03-25-2015  T1c, Gleason 4+3  s/p  cryoablation prostate 06-02-2015/  external  radiation therapy 08-16-2016 to 09-20-2016/  scheduled for radioactive seed implants 10-22-2016   TIA (transient ischemic attack)    ?? 08/20/2014? Eliquis 07/02/15   Type 2 diabetes mellitus ([place])    followed by dr [name] [facility] clinic)  last A1c 8.4 on 07-27-2016 per lov note   Umbilical hernia    Wears glasses    Wears hearing aid    bilateral    Past Surgical History:  Procedure Laterality Date   COLONOSCOPY WITH PROPOFOL N/A 05/30/2017   Procedure: COLONOSCOPY WITH PROPOFOL;  Surgeon: [name], MD;  Location: [facility] ENDOSCOPY;  Service: Endoscopy;  Laterality: N/A;   CRYOABLATION N/A 06/02/2015   Procedure: CRYO ABLATION PROSTATE;  Surgeon: Carolan Clines, MD;  Location: [facility];  Service: Urology;  Laterality: N/A;   CYSTOSCOPY WITH URETHRAL DILATATION N/A 05/04/2019   Procedure: CYSTOSCOPY WITH URETHRAL DILATATION;  Surgeon: [name], MD;  Location: [facility];  Service: Urology;  Laterality: N/A;   ELBOW BURSA SURGERY Left Feb 1997   prostate biopsies     x12, cryotherapy   RADIOACTIVE SEED IMPLANT N/A 10/22/2016   Procedure: RADIOACTIVE SEED  IMPLANT/BRACHYTHERAPY IMPLANT, URETHRAL STRICTURE DIALIATION;  Surgeon: Carolan Clines, MD;  Location: [facility];  Service: Urology;  Laterality: N/A;   SHOULDER OPEN ROTATOR CUFF REPAIR Left 09-04-2014   skin cancer L ear     Dr [name]    TOTAL KNEE ARTHROPLASTY Bilateral right 08-13-2008  &  left [phone]   UMBILICAL HERNIA REPAIR  06-23-2012   ureter stretch  05/02/2019   [facility] Dr [name]    Family History Mother-colorectal cancer- ~5  years old No h/o GI disease or malignancy  Review of Systems  Constitutional:  Negative for activity change, appetite change, chills, diaphoresis, fatigue, fever and unexpected weight change.  HENT:  Negative for trouble swallowing and voice change.   Respiratory:  Negative for shortness of breath and wheezing.   Cardiovascular:  Negative for chest pain, palpitations and leg swelling.  Gastrointestinal:  Positive for anal bleeding. Negative for abdominal distention, abdominal pain, blood in stool, constipation, diarrhea, nausea and vomiting.  Musculoskeletal:  Negative for arthralgias and myalgias.  Skin:  Negative for color change and pallor.  Neurological:  Negative for dizziness, syncope and weakness.  Psychiatric/Behavioral:  Negative for confusion. The patient is not nervous/anxious.   All other systems reviewed and are negative.   Medications Current Facility-Administered Medications on File Prior to Encounter  Medication Dose Route Frequency Provider Last Rate Last Admin   acetaminophen (TYLENOL) tablet 975 mg  975 mg Oral Q6H PRN Carolan Clines, MD       Current Outpatient Medications on File Prior to Encounter  Medication Sig Dispense Refill   ACCU-CHEK FASTCLIX LANCETS MISC   0   amLODipine (NORVASC) 5 MG tablet TAKE 1 TABLET(5 MG) BY MOUTH EVERY DAY     ascorbic acid (VITAMIN C) 500 MG tablet Take 1,000 mg by mouth daily.     bisoprolol-hydrochlorothiazide (ZIAC) 10-6.25 MG tablet TAKE 1 TABLET BY MOUTH EVERY DAY     Cholecalciferol (VITAMIN D3) 2000 UNITS TABS Take 1 capsule by mouth daily.     esomeprazole (NEXIUM) 40 MG capsule Take 40 mg by mouth every evening.      furosemide (LASIX) 40 MG tablet Take 40 mg by mouth daily.      glimepiride (AMARYL) 2 MG tablet TAKE 1 TABLET BY MOUTH TWICE DAILY     glucose blood test strip TEST TWICE DAILY AS INSTRUCTED     metFORMIN (GLUCOPHAGE) 1000 MG tablet TAKE 1 TABLET(1000 MG) BY MOUTH TWICE DAILY WITH MEALS     Na  Sulfate-K Sulfate-Mg Sulf 17.5-3.13-1.6 GM/177ML SOLN See admin instructions.     quinapril (ACCUPRIL) 20 MG tablet TAKE 1 TABLET BY MOUTH TWICE DAILY     Red Yeast Rice Extract (RED YEAST RICE PO) Take by mouth 2 (two) times daily.     Acetaminophen (TYLENOL PO) Take 2-3 tablets by mouth as needed.     amLODipine (NORVASC) 5 MG tablet Take by mouth.     amoxicillin (AMOXIL) 875 MG tablet SMARTSIG:1 Tablet(s) By Mouth Every 12 Hours (Patient not taking: Reported on 12/28/2021)     apixaban (ELIQUIS) 5 MG TABS tablet TAKE 1 TABLET(5 MG) BY MOUTH TWICE DAILY     Aspirin-Acetaminophen-Caffeine (EXCEDRIN PO) Take 2-3 tablets by mouth. Rarely as needed for headache. Once in 3 mo max. +2 more in 1/2 if a severe headache.     Cysteamine Bitartrate (PROCYSBI) 300 MG PACK Use 1 each 2 (two) times daily Use as instructed.     EPINEPHrine 0.3 mg/0.3 mL IJ SOAJ  injection Inject into the muscle as needed.     EPINEPHrine 0.3 mg/0.3 mL IJ SOAJ injection INJECT 0.3 MG INTO THE MUSCLE ONCE AS NEEDED FOR ANAPHYLAXIS     FLOWFLEX COVID-19 AG HOME TEST KIT TEST AS DIRECTED TODAY     fluticasone (FLONASE) 50 MCG/ACT nasal spray Place into both nostrils. (Patient not taking: Reported on 12/28/2021)     Ibuprofen (ADVIL PO) Take 2 tablets by mouth as needed.     metFORMIN (GLUCOPHAGE) 1000 MG tablet Take by mouth. (Patient not taking: Reported on 12/28/2021)     sitaGLIPtin (JANUVIA) 100 MG tablet Take by mouth.     tiZANidine (ZANAFLEX) 2 MG tablet Take 2 mg by mouth 3 (three) times daily. (Patient not taking: Reported on 12/28/2021)      Pertinent medications related to GI and procedure were reviewed by me with the patient prior to the procedure   Current Facility-Administered Medications:    0.9 %  sodium chloride infusion, , Intravenous, Continuous, Annamaria Helling, DO, Last Rate: 20 mL/hr at 12/28/21 0827, Continued from Pre-op at 12/28/21 0827  Facility-Administered Medications Ordered in Other  Encounters:    acetaminophen (TYLENOL) tablet 975 mg, 975 mg, Oral, Q6H PRN, Carolan Clines, MD      Allergies  Allergen Reactions   Bee Venom Anaphylaxis   Crestor [Rosuvastatin] Other (See Comments)    "muscle weakness and discomfort"   Vytorin [Ezetimibe-Simvastatin] Other (See Comments)    "muscle weakness and discomfort"   Dynapen [Dicloxacillin] Rash   Allergies were reviewed by me prior to the procedure  Objective    Vitals:   12/28/21 0752  BP: (!) 131/95  Pulse: 96  Resp: 18  Temp: (!) 96.6 F (35.9 C)  TempSrc: Temporal  SpO2: 96%  Weight: 101.4 kg  Height: 5' 9"  (1.753 m)     Physical Exam Vitals and nursing note reviewed.  Constitutional:      General: He is not in acute distress.    Appearance: Normal appearance. He is not ill-appearing, toxic-appearing or diaphoretic.  HENT:     Head: Normocephalic and atraumatic.     Nose: Nose normal.     Mouth/Throat:     Mouth: Mucous membranes are moist.     Pharynx: Oropharynx is clear.  Eyes:     General: No scleral icterus.    Extraocular Movements: Extraocular movements intact.  Cardiovascular:     Rate and Rhythm: Normal rate and regular rhythm.     Heart sounds: Normal heart sounds. No murmur heard.   No friction rub. No gallop.  Pulmonary:     Effort: Pulmonary effort is normal. No respiratory distress.     Breath sounds: Normal breath sounds. No wheezing, rhonchi or rales.  Abdominal:     General: Bowel sounds are normal. There is no distension.     Palpations: Abdomen is soft.     Tenderness: There is no abdominal tenderness. There is no guarding or rebound.  Musculoskeletal:     Cervical back: Neck supple.     Right lower leg: No edema.     Left lower leg: No edema.  Skin:    General: Skin is warm and dry.     Coloration: Skin is not jaundiced or pale.  Neurological:     General: No focal deficit present.     Mental Status: He is alert and oriented to person, place, and time. Mental  status is at baseline.  Psychiatric:  Mood and Affect: Mood normal.        Behavior: Behavior normal.        Thought Content: Thought content normal.        Judgment: Judgment normal.     Assessment:  Mr. ELTON HEID is a 76 y.o. male  who presents today for Colonoscopy for Surveillance colonoscopy-family history of colon cancer (mother~75).  Plan:  Colonoscopy with possible intervention today  Colonoscopy with possible biopsy, control of bleeding, polypectomy, and interventions as necessary has been discussed with the patient/patient representative. Informed consent was obtained from the patient/patient representative after explaining the indication, nature, and risks of the procedure including but not limited to death, bleeding, perforation, missed neoplasm/lesions, cardiorespiratory compromise, and reaction to medications. Opportunity for questions was given and appropriate answers were provided. Patient/patient representative has verbalized understanding is amenable to undergoing the procedure.   Annamaria Helling, DO  [facility]  Portions of the record may have been created with voice recognition software. Occasional wrong-word or 'sound-a-like' substitutions may have occurred due to the inherent limitations of voice recognition software.  Read the chart carefully and recognize, using context, where substitutions may have occurred.

## 2021-12-28 ENCOUNTER — Ambulatory Visit: Payer: HMO | Admitting: Anesthesiology

## 2021-12-28 ENCOUNTER — Encounter
Admission: RE | Disposition: A | Discharge status: Home / Self Care | Payer: Self-pay | Source: Home / Self Care | Attending: Gastroenterology

## 2021-12-28 ENCOUNTER — Ambulatory Visit
Admission: RE | Admit: 2021-12-28 | Discharge: 2021-12-28 | Disposition: A | Discharge status: Home / Self Care | Payer: HMO | Attending: Gastroenterology | Admitting: Gastroenterology

## 2021-12-28 ENCOUNTER — Encounter: Payer: Self-pay | Admitting: Gastroenterology

## 2021-12-28 DIAGNOSIS — I10 Essential (primary) hypertension: Secondary | ICD-10-CM | POA: Diagnosis not present

## 2021-12-28 DIAGNOSIS — Z800 Family history of malignant neoplasm of digestive organs: Secondary | ICD-10-CM | POA: Diagnosis not present

## 2021-12-28 DIAGNOSIS — Z86718 Personal history of other venous thrombosis and embolism: Secondary | ICD-10-CM | POA: Diagnosis not present

## 2021-12-28 DIAGNOSIS — Z79899 Other long term (current) drug therapy: Secondary | ICD-10-CM | POA: Diagnosis not present

## 2021-12-28 DIAGNOSIS — K5520 Angiodysplasia of colon without hemorrhage: Secondary | ICD-10-CM | POA: Diagnosis not present

## 2021-12-28 DIAGNOSIS — G4730 Sleep apnea, unspecified: Secondary | ICD-10-CM | POA: Diagnosis not present

## 2021-12-28 DIAGNOSIS — K219 Gastro-esophageal reflux disease without esophagitis: Secondary | ICD-10-CM | POA: Insufficient documentation

## 2021-12-28 DIAGNOSIS — E119 Type 2 diabetes mellitus without complications: Secondary | ICD-10-CM | POA: Insufficient documentation

## 2021-12-28 DIAGNOSIS — Z7984 Long term (current) use of oral hypoglycemic drugs: Secondary | ICD-10-CM | POA: Diagnosis not present

## 2021-12-28 DIAGNOSIS — Z7901 Long term (current) use of anticoagulants: Secondary | ICD-10-CM | POA: Insufficient documentation

## 2021-12-28 DIAGNOSIS — Z1211 Encounter for screening for malignant neoplasm of colon: Secondary | ICD-10-CM | POA: Diagnosis not present

## 2021-12-28 DIAGNOSIS — K627 Radiation proctitis: Secondary | ICD-10-CM | POA: Diagnosis not present

## 2021-12-28 DIAGNOSIS — K589 Irritable bowel syndrome without diarrhea: Secondary | ICD-10-CM | POA: Insufficient documentation

## 2021-12-28 DIAGNOSIS — G4733 Obstructive sleep apnea (adult) (pediatric): Secondary | ICD-10-CM | POA: Insufficient documentation

## 2021-12-28 DIAGNOSIS — E785 Hyperlipidemia, unspecified: Secondary | ICD-10-CM | POA: Diagnosis not present

## 2021-12-28 DIAGNOSIS — Q438 Other specified congenital malformations of intestine: Secondary | ICD-10-CM | POA: Insufficient documentation

## 2021-12-28 DIAGNOSIS — Z8546 Personal history of malignant neoplasm of prostate: Secondary | ICD-10-CM | POA: Insufficient documentation

## 2021-12-28 DIAGNOSIS — Z538 Procedure and treatment not carried out for other reasons: Secondary | ICD-10-CM | POA: Diagnosis not present

## 2021-12-28 DIAGNOSIS — K5730 Diverticulosis of large intestine without perforation or abscess without bleeding: Secondary | ICD-10-CM | POA: Diagnosis not present

## 2021-12-28 HISTORY — PX: COLONOSCOPY WITH PROPOFOL: SHX5780

## 2021-12-28 LAB — GLUCOSE, CAPILLARY: Glucose-Capillary: 217 mg/dL — ABNORMAL HIGH (ref 70–99)

## 2021-12-28 SURGERY — COLONOSCOPY WITH PROPOFOL
Anesthesia: General

## 2021-12-28 MED ORDER — SODIUM CHLORIDE 0.9 % IV SOLN
INTRAVENOUS | Status: DC
  Administered 2021-12-28: 1000 mL via INTRAVENOUS

## 2021-12-28 MED ORDER — PROPOFOL 500 MG/50ML IV EMUL
INTRAVENOUS | Status: AC
  Filled 2021-12-28: qty 50

## 2021-12-28 MED ORDER — PROPOFOL 500 MG/50ML IV EMUL
INTRAVENOUS | Status: DC | PRN
  Administered 2021-12-28: 150 ug/kg/min via INTRAVENOUS

## 2021-12-28 NOTE — Anesthesia Preprocedure Evaluation (Addendum)
Anesthesia Evaluation  ?Patient identified by MRN, date of birth, ID band ?Patient awake ? ? ? ?Reviewed: ?Allergy & Precautions, NPO status , Patient's Chart, lab work & pertinent test results ? ?Airway ?Mallampati: III ? ?TM Distance: >3 FB ?Neck ROM: full ? ? ? Dental ? ?(+) Poor Dentition, Implants, Caps ?  ?Pulmonary ?sleep apnea and Continuous Positive Airway Pressure Ventilation ,  ?  ?Pulmonary exam normal ? ? ? ? ? ? ? Cardiovascular ?Exercise Tolerance: Good ?hypertension, Normal cardiovascular exam ? 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  ?Neuro/Psych ?TIAnegative psych ROS  ? GI/Hepatic ?Neg liver ROS, hiatal hernia, GERD  Controlled and Medicated,  ?Endo/Other  ?diabetes, Type 2, Oral Hypoglycemic Agents ? Renal/GU ?negative Renal ROS  ?negative genitourinary ?  ?Musculoskeletal ? ?(+) Arthritis ,  ? Abdominal ?Normal abdominal exam  (+)   ?Peds ? Hematology ?negative hematology ROS ?(+)   ?Anesthesia Other Findings ?Past Medical History: ?No date: Actinic keratosis ?No date: Anticoagulant long-term use ?    Comment:  ELIQUIS ?No date: Arthritis ?01/27/2010: Basal cell carcinoma ?    Comment:  Right spinal upper back. Superficial.  ?11/15/2019: Basal cell carcinoma ?    Comment:  Left inferior antihelix post. to tragus. Superficial and ?             nodular patterns. Excised: 11/27/2019 ?11/15/2019: Basal cell carcinoma ?    Comment:  Left mid lateral nose. Nodular and infiltrative  ?             patterns. Excised: 11/27/2019 ?11/27/2019: Basal cell carcinoma ?    Comment:  Left mid superior helix. BCC with focal sclerosis. ?01/29/2020: Basal cell carcinoma ?    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    Comment:  Left scapula. Slight atypia. Edges free. ?No date: ED (erectile dysfunction) ?No date: GERD (gastroesophageal reflux disease) ?No date: Hiatal hernia ?No date: History of DVT of lower extremity ?    Comment:  POST OP KNEE  SURGERY ?No date: History of transient ischemic attack (TIA) ?    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    Comment:  per pt positive sleep study yrs ago recommended CPAP --  ?             pt Non- compliant -- states uses Breath Rite Nasal Strips ?No date: Peripheral neuropathy ?urologist-  dr [name]  oncologist-- dr [name]: Prostate cancer  ?([facility]) ?    Comment:  dx 03-25-2015  T1c, Gleason 4+3  s/p  cryoablation  ?             prostate 06-02-2015/  external  radiation therapy  ?             08-16-2016 to 09-20-2016/  scheduled for radioactive seed ?             implants 10-22-2016 ?No date: TIA (transient ischemic attack) ?    Comment:  ?? 08/20/2014? Eliquis 07/02/15 ?No date: Type 2 diabetes mellitus ([place]) ?    Comment:  followed by dr [name] [facility] clinic)  last  ?             A1c 8.4 on 07-27-2016 per lov note ?No date: Umbilical hernia ?No date: Wears glasses ?No date: Wears hearing aid ?    Comment:  bilateral ? ?Past Surgical History: ?05/30/2017:  COLONOSCOPY WITH PROPOFOL; N/A ?    Comment:  Procedure: COLONOSCOPY WITH PROPOFOL;  Surgeon: Vira Agar, ?             Gavin Pound, MD;  Location: [facility] ENDOSCOPY;  Service:  ?             Endoscopy;  Laterality: N/A; ?06/02/2015: CRYOABLATION; N/A ?    Comment:  Procedure: CRYO ABLATION PROSTATE;  Surgeon: Pierre Bali  ?             [name], MD;  Location: [facility];   ?             Service: Urology;  Laterality: N/A; ?05/04/2019: CYSTOSCOPY WITH URETHRAL DILATATION; N/A ?    Comment:  Procedure: CYSTOSCOPY WITH URETHRAL DILATATION;   ?             Surgeon: [name], MD;  Location: [place]  ?             [facility];  Service: Urology;  Laterality: N/A; ?Feb 1997: ELBOW BURSA SURGERY; Left ?No date: prostate biopsies ?    Comment:  x12, cryotherapy ?10/22/2016: RADIOACTIVE SEED IMPLANT; N/A ?    Comment:   Procedure: RADIOACTIVE SEED IMPLANT/BRACHYTHERAPY  ?             IMPLANT, URETHRAL STRICTURE DIALIATION;  Surgeon: Pierre Bali ?             [name], MD;  Location: [facility];   ?             Service: Urology;  Laterality: N/A; ?09-04-2014: SHOULDER OPEN ROTATOR CUFF REPAIR; Left ?No date: skin cancer L ear ?    Comment:  Dr [name]  ?right 08-13-2008  &  left 11-14-2008: TOTAL KNEE ARTHROPLASTY;  ?Bilateral ?[phone]: UMBILICAL HERNIA REPAIR ?05/02/2019: ureter stretch ?    Comment:  [facility] Dr [name] ? ? ? ? Reproductive/Obstetrics ?negative OB ROS ? ?  ? ? ? ? ? ? ? ? ? ? ? ? ? ?  ?  ? ? ? ? ? ? ? ?Anesthesia Physical ?Anesthesia Plan ? ?ASA: 3 ? ?Anesthesia Plan: General  ? ?Post-op Pain Management:   ? ?Induction: Intravenous ? ?PONV Risk Score and Plan: Propofol infusion and TIVA ? ?Airway Management Planned: Natural Airway and Simple Face Mask ? ?Additional Equipment:  ? ?Intra-op Plan:  ? ?Post-operative Plan:  ? ?Informed Consent: I have reviewed the patients History and Physical, chart, labs and discussed the procedure including the risks, benefits and alternatives for the proposed anesthesia with the patient or authorized representative who has indicated his/her understanding and acceptance.  ? ? ? ?Dental advisory given ? ?Plan Discussed with: Anesthesiologist, CRNA and Surgeon ? ?Anesthesia Plan Comments:   ? ? ? ? ? ?Anesthesia Quick Evaluation ? ?

## 2021-12-28 NOTE — Op Note (Signed)
[facility] ?Gastroenterology ?Patient Name: Jeremiah Zhang ?Procedure Date: 12/28/2021 8:28 AM ?MRN: [id number] ?Account #: [id number] ?Date of Birth: 06/11/1946 ?Admit Type: Outpatient ?Age: 76 ?Room: [facility] ENDO ROOM 2 ?Gender: Male ?Note Status: Finalized ?Instrument Name: Colonoscope [id number] ?Procedure:             Colonoscopy ?Indications:           Screening patient at increased risk: Family history of  ?                       1st-degree relative with colorectal cancer at age 66  ?                       years (or older) ?Providers:             Annamaria Helling DO, DO ?Referring MD:          No Local Md, MD (Referring MD) ?Medicines:             Monitored Anesthesia Care ?Complications:         No immediate complications. Estimated blood loss: None. ?Procedure:             Pre-Anesthesia Assessment: ?                       - Prior to the procedure, a History and Physical was  ?                       performed, and patient medications and allergies were  ?                       reviewed. The patient is competent. The risks and  ?                       benefits of the procedure and the sedation options and  ?                       risks were discussed with the patient. All questions  ?                       were answered and informed consent was obtained.  ?                       Patient identification and proposed procedure were  ?                       verified by the physician, the nurse, the anesthetist  ?                       and the technician in the endoscopy suite. Mental  ?                       Status Examination: alert and oriented. Airway  ?                       Examination: normal oropharyngeal airway and neck  ?                       mobility. Respiratory Examination: clear to  ?  auscultation. CV Examination: RRR, no murmurs, no S3  ?                       or S4. Prophylactic Antibiotics: The patient does not  ?                       require prophylactic  antibiotics. Prior  ?                       Anticoagulants: The patient has taken no previous  ?                       anticoagulant or antiplatelet agents. ASA Grade  ?                       Assessment: III - A patient with severe systemic  ?                       disease. After reviewing the risks and benefits, the  ?                       patient was deemed in satisfactory condition to  ?                       undergo the procedure. The anesthesia plan was to use  ?                       monitored anesthesia care (MAC). Immediately prior to  ?                       administration of medications, the patient was  ?                       re-assessed for adequacy to receive sedatives. The  ?                       heart rate, respiratory rate, oxygen saturations,  ?                       blood pressure, adequacy of pulmonary ventilation, and  ?                       response to care were monitored throughout the  ?                       procedure. The physical status of the patient was  ?                       re-assessed after the procedure. ?                       After obtaining informed consent, the colonoscope was  ?                       passed under direct vision. Throughout the procedure,  ?                       the patient's blood pressure, pulse, and oxygen  ?  saturations were monitored continuously. The  ?                       Colonoscope was introduced through the anus and  ?                       advanced to the the ascending colon. The colonoscopy  ?                       was technically difficult and complex due to a  ?                       redundant colon, significant looping and a tortuous  ?                       colon. Successful completion of the procedure was  ?                       aided by changing the patient to a prone position,  ?                       using manual pressure, straightening and shortening  ?                       the scope to obtain bowel loop  reduction, using scope  ?                       torsion, applying abdominal pressure and lavage. The  ?                       patient tolerated the procedure well. The quality of  ?                       the bowel preparation was excellent. The rectum was  ?                       photographed. ?Findings: ?     The perianal and digital rectal examinations were normal. Pertinent  ?     negatives include normal sphincter tone. ?     A few small-mouthed diverticula were found in the recto-sigmoid colon  ?     and sigmoid colon. Estimated blood loss: none. ?     A few small localized angioectasias without bleeding were found in the  ?     rectum. Consistent with radiation proctitis Estimated blood loss: none. ?     Exam otherwise nromal of what was able to be visualzed. Right colon not  ?     able to be completely evaluated. ?     There was moderate spasm in the recto-sigmoid colon, in the sigmoid  ?     colon and in the descending colon. Estimated blood loss: none. ?Impression:            - Diverticulosis in the recto-sigmoid colon and in the  ?                       sigmoid colon. ?                       - A few non-bleeding colonic  angioectasias. ?                       - Moderate colonic spasm. ?                       - No specimens collected. ?Recommendation:        - Discharge patient to home. ?                       - Resume previous diet. ?                       - Continue present medications. ?                       - Resume Eliquis (apixaban) at prior dose today. Refer  ?                       to managing physician for further adjustment of  ?                       therapy. ?                       - Repeat colonoscopy 6-12 months because the  ?                       examination was incomplete. ?                       - Return to referring physician as previously  ?                       scheduled. ?                       - The findings and recommendations were discussed with  ?                       the  patient. ?Procedure Code(s):     --- Professional --- ?                       [phone], 53, Colonoscopy, flexible; diagnostic,  ?                       including collection of specimen(s) by brushing or  ?                       washing, when performed (separate procedure) ?Diagnosis Code(s):     --- Professional --- ?                       Z80.0, Family history of malignant neoplasm of  ?                       digestive organs ?                       K58.9, Irritable bowel syndrome without diarrhea ?                       K55.20, Angiodysplasia of colon without hemorrhage ?  K57.30, Diverticulosis of large intestine without  ?                       perforation or abscess without bleeding ?CPT copyright 2019 [employer]. All rights reserved. ?The codes documented in this report are preliminary and upon coder review may  ?be revised to meet current compliance requirements. ?Attending Participation: ?     I personally performed the entire procedure. ?Volney American, DO ?Annamaria Helling DO, DO ?12/28/2021 9:22:13 AM ?This report has been signed electronically. ?Number of Addenda: 0 ?Note Initiated On: 12/28/2021 8:28 AM ?Scope Withdrawal Time: 0 hours 23 minutes 11 seconds  ?Total Procedure Duration: 0 hours 39 minutes 10 seconds  ?Estimated Blood Loss:  Estimated blood loss: none. ?     [facility] ?

## 2021-12-28 NOTE — Anesthesia Postprocedure Evaluation (Signed)
Anesthesia Post Note ? ?Patient: Jeremiah Zhang ? ?Procedure(s) Performed: COLONOSCOPY WITH PROPOFOL ? ?Patient location during evaluation: Endoscopy ?Anesthesia Type: General ?Level of consciousness: awake and alert ?Pain management: pain level controlled ?Vital Signs Assessment: post-procedure vital signs reviewed and stable ?Respiratory status: spontaneous breathing, nonlabored ventilation and respiratory function stable ?Cardiovascular status: blood pressure returned to baseline and stable ?Postop Assessment: no apparent nausea or vomiting ?Anesthetic complications: no ? ? ?No notable events documented. ? ? ?Last Vitals:  ?Vitals:  ? 12/28/21 0940 12/28/21 0950  ?BP: (!) 130/91 (!) 143/93  ?Pulse:    ?Resp:    ?Temp:    ?SpO2: 95%   ?  ?Last Pain:  ?Vitals:  ? 12/28/21 0950  ?TempSrc:   ?PainSc: 0-No pain  ? ? ?  ?  ?  ?  ?  ?  ? ?[place] Ouch ? ? ? ? ?

## 2021-12-28 NOTE — Transfer of Care (Signed)
Immediate Anesthesia Transfer of Care Note ? ?Patient: Jeremiah Zhang ? ?Procedure(s) Performed: COLONOSCOPY WITH PROPOFOL ? ?Patient Location: PACU ? ?Anesthesia Type:General ? ?Level of Consciousness: awake and sedated ? ?Airway & Oxygen Therapy: Patient Spontanous Breathing and Patient connected to face mask oxygen ? ?Post-op Assessment: Report given to RN and Post -op Vital signs reviewed and stable ? ?Post vital signs: Reviewed and stable ? ?Last Vitals:  ?Vitals Value Taken Time  ?BP    ?Temp    ?Pulse    ?Resp    ?SpO2    ? ? ?Last Pain:  ?Vitals:  ? 12/28/21 0752  ?TempSrc: Temporal  ?PainSc: 0-No pain  ?   ? ?  ? ?Complications: No notable events documented. ?

## 2021-12-28 NOTE — Interval H&P Note (Signed)
History and Physical Interval Note: Preprocedure H&P from 12/28/21 ? was reviewed and there was no interval change after seeing and examining the patient.  Written consent was obtained from the patient after discussion of risks, benefits, and alternatives. Patient has consented to proceed with Colonoscopy with possible intervention ? ? ?12/28/2021 ?8:28 AM ? ?Jeremiah Zhang  has presented today for surgery, with the diagnosis of Z80.0 Family history of colon cancer requiring screening colonoscopy.  The various methods of treatment have been discussed with the patient and family. After consideration of risks, benefits and other options for treatment, the patient has consented to  Procedure(s): ?COLONOSCOPY WITH PROPOFOL (N/A) as a surgical intervention.  The patient's history has been reviewed, patient examined, no change in status, stable for surgery.  I have reviewed the patient's chart and labs.  Questions were answered to the patient's satisfaction.   ? ? ?Annamaria Helling ? ? ?

## 2021-12-28 NOTE — Anesthesia Procedure Notes (Signed)
Date/Time: 12/28/2021 8:40 AM ?Performed by: Vaughan Sine ?Pre-anesthesia Checklist: Patient identified, Emergency Drugs available, Suction available, Patient being monitored and Timeout performed ?Patient Re-evaluated:Patient Re-evaluated prior to induction ?Oxygen Delivery Method: Supernova nasal CPAP ?Preoxygenation: Pre-oxygenation with 100% oxygen ?Induction Type: IV induction ?Placement Confirmation: positive ETCO2 and CO2 detector ? ? ? ? ?

## 2022-01-04 ENCOUNTER — Telehealth: Payer: Self-pay

## 2022-01-04 NOTE — Telephone Encounter (Signed)
Incoming call on triage line from pt in regards to testosterone gel. Pt states he was switched to 3 pumps instead of 2. Pt states his arm and shoulder turns to pins and needles when he applies the 3 pumps. Pt has decided to go back to 2 pumps. He would like to know if you have any alternative suggestions that could be taken in place of the additional pump.  ?

## 2022-01-05 NOTE — Telephone Encounter (Signed)
Notified patient as instructed, He states he is doing 2 pumps. It is working better no pins and needles feeling.  ? ?

## 2022-01-05 NOTE — Addendum Note (Signed)
Addended by: Abbie Sons on: 01/05/2022 02:43 PM ? ? Modules accepted: Orders ? ?

## 2022-01-05 NOTE — Telephone Encounter (Signed)
There is not anything to supplement the additional pump.  He could be switched from a topical gel to injections ?

## 2022-01-11 ENCOUNTER — Ambulatory Visit: Payer: HMO | Admitting: Dermatology

## 2022-01-11 ENCOUNTER — Other Ambulatory Visit: Payer: Self-pay

## 2022-01-11 DIAGNOSIS — Z872 Personal history of diseases of the skin and subcutaneous tissue: Secondary | ICD-10-CM

## 2022-01-11 DIAGNOSIS — L578 Other skin changes due to chronic exposure to nonionizing radiation: Secondary | ICD-10-CM

## 2022-01-11 DIAGNOSIS — L821 Other seborrheic keratosis: Secondary | ICD-10-CM

## 2022-01-11 DIAGNOSIS — C44311 Basal cell carcinoma of skin of nose: Secondary | ICD-10-CM

## 2022-01-11 DIAGNOSIS — D489 Neoplasm of uncertain behavior, unspecified: Secondary | ICD-10-CM

## 2022-01-11 DIAGNOSIS — L570 Actinic keratosis: Secondary | ICD-10-CM | POA: Diagnosis not present

## 2022-01-11 DIAGNOSIS — Z85828 Personal history of other malignant neoplasm of skin: Secondary | ICD-10-CM

## 2022-01-11 DIAGNOSIS — C4491 Basal cell carcinoma of skin, unspecified: Secondary | ICD-10-CM

## 2022-01-11 HISTORY — DX: Basal cell carcinoma of skin, unspecified: C44.91

## 2022-01-11 NOTE — Patient Instructions (Addendum)
Electrodesiccation and Curettage (?Scrape and Burn?) Wound Care Instructions ? ?Leave the original bandage on for 24 hours if possible.  If the bandage becomes soaked or soiled before that time, it is OK to remove it and examine the wound.  A small amount of post-operative bleeding is normal.  If excessive bleeding occurs, remove the bandage, place gauze over the site and apply continuous pressure (no peeking) over the area for 30 minutes. If this does not work, please call our clinic as soon as possible or page your doctor if it is after hours.  ? ?Once a day, cleanse the wound with soap and water. It is fine to shower. If a thick crust develops you may use a Q-tip dipped into dilute hydrogen peroxide (mix 1:1 with water) to dissolve it.  Hydrogen peroxide can slow the healing process, so use it only as needed.   ? ?After washing, apply petroleum jelly (Vaseline) or an antibiotic ointment if your doctor prescribed one for you, followed by a bandage.   ? ?For best healing, the wound should be covered with a layer of ointment at all times. If you are not able to keep the area covered with a bandage to hold the ointment in place, this may mean re-applying the ointment several times a day.  Continue this wound care until the wound has healed and is no longer open. It may take several weeks for the wound to heal and close. ? ?Itching and mild discomfort is normal during the healing process. ? ?If you have any discomfort, you can take Tylenol (acetaminophen) or ibuprofen as directed on the bottle. (Please do not take these if you have an allergy to them or cannot take them for another reason). ? ?Some redness, tenderness and white or yellow material in the wound is normal healing.  If the area becomes very sore and red, or develops a thick yellow-green material (pus), it may be infected; please notify us.   ? ?Wound healing continues for up to one year following surgery. It is not unusual to experience pain in the scar  from time to time during the interval.  If the pain becomes severe or the scar thickens, you should notify the office.   ? ?A slight amount of redness in a scar is expected for the first six months.  After six months, the redness will fade and the scar will soften and fade.  The color difference becomes less noticeable with time.  If there are any problems, return for a post-op surgery check at your earliest convenience. ? ?To improve the appearance of the scar, you can use silicone scar gel, cream, or sheets (such as Mederma or Serica) every night for up to one year. These are available over the counter (without a prescription). ? ?Please call our office at [phone] for any questions or concerns. ? ? ? ? ? ?Biopsy Wound Care Instructions ? ?Leave the original bandage on for 24 hours if possible.  If the bandage becomes soaked or soiled before that time, it is OK to remove it and examine the wound.  A small amount of post-operative bleeding is normal.  If excessive bleeding occurs, remove the bandage, place gauze over the site and apply continuous pressure (no peeking) over the area for 30 minutes. If this does not work, please call our clinic as soon as possible or page your doctor if it is after hours.  ? ?Once a day, cleanse the wound with soap and water. It is  fine to shower. If a thick crust develops you may use a Q-tip dipped into dilute hydrogen peroxide (mix 1:1 with water) to dissolve it.  Hydrogen peroxide can slow the healing process, so use it only as needed.   ? ?After washing, apply petroleum jelly (Vaseline) or an antibiotic ointment if your doctor prescribed one for you, followed by a bandage.   ? ?For best healing, the wound should be covered with a layer of ointment at all times. If you are not able to keep the area covered with a bandage to hold the ointment in place, this may mean re-applying the ointment several times a day.  Continue this wound care until the wound has healed and is no  longer open.  ? ?Itching and mild discomfort is normal during the healing process. However, if you develop pain or severe itching, please call our office.  ? ?If you have any discomfort, you can take Tylenol (acetaminophen) or ibuprofen as directed on the bottle. (Please do not take these if you have an allergy to them or cannot take them for another reason). ? ?Some redness, tenderness and white or yellow material in the wound is normal healing.  If the area becomes very sore and red, or develops a thick yellow-green material (pus), it may be infected; please notify us.   ? ?If you have stitches, return to clinic as directed to have the stitches removed. You will continue wound care for 2-3 days after the stitches are removed.  ? ?Wound healing continues for up to one year following surgery. It is not unusual to experience pain in the scar from time to time during the interval.  If the pain becomes severe or the scar thickens, you should notify the office.   ? ?A slight amount of redness in a scar is expected for the first six months.  After six months, the redness will fade and the scar will soften and fade.  The color difference becomes less noticeable with time.  If there are any problems, return for a post-op surgery check at your earliest convenience. ? ?To improve the appearance of the scar, you can use silicone scar gel, cream, or sheets (such as Mederma or Serica) every night for up to one year. These are available over the counter (without a prescription). ? ?Please call our office at [phone] for any questions or concerns. ? ? ? ? ? ? ?Cryotherapy Aftercare ? ?Wash gently with soap and water everyday.   ?Apply Vaseline and Band-Aid daily until healed.  ? ? ? ? ? ?Actinic keratoses are precancerous spots that appear secondary to cumulative UV radiation exposure/sun exposure over time. They are chronic with expected duration over 1 year. A portion of actinic keratoses will progress to squamous cell  carcinoma of the skin. It is not possible to reliably predict which spots will progress to skin cancer and so treatment is recommended to prevent development of skin cancer. ? ?Recommend daily broad spectrum sunscreen SPF 30+ to sun-exposed areas, reapply every 2 hours as needed.  ?Recommend staying in the shade or wearing long sleeves, sun glasses (UVA+UVB protection) and wide brim hats (4-inch brim around the entire circumference of the hat). ?Call for new or changing lesions.  ? ? ?If You Need Anything After Your Visit ? ?If you have any questions or concerns for your doctor, please call our main line at [phone] and press option 4 to reach your doctor's medical assistant. If no one answers, please leave a  voicemail as directed and we will return your call as soon as possible. Messages left after 4 pm will be answered the following business day.  ? ?You may also send [place] a message via MyChart. We typically respond to MyChart messages within 1-2 business days. ? ?For prescription refills, please ask your pharmacy to contact our office. Our fax number is [phone]. ? ?If you have an urgent issue when the clinic is closed that cannot wait until the next business day, you can page your doctor at the number below.   ? ?Please note that while we do our best to be available for urgent issues outside of office hours, we are not available 24/7.  ? ?If you have an urgent issue and are unable to reach [place], you may choose to seek medical care at your doctor's office, retail clinic, urgent care center, or emergency room. ? ?If you have a medical emergency, please immediately call 911 or go to the emergency department. ? ?Pager Numbers ? ?- Dr. [name]: [phone] ? ?- Dr. Laurence Ferrari: [phone] ? ?- Dr. Nicole Kindred: [phone] ? ?In the event of inclement weather, please call our main line at [phone] for an update on the status of any delays or closures. ? ?Dermatology Medication Tips: ?Please keep the boxes that  topical medications come in in order to help keep track of the instructions about where and how to use these. Pharmacies typically print the medication instructions only on the boxes and not directly on the medic

## 2022-01-11 NOTE — Progress Notes (Signed)
? ?Follow-Up Visit ?  ?Subjective  ?Jeremiah Zhang is a 76 y.o. male who presents for the following: Follow-up (Patient here today for 6 month follow up. Patient reports a bump inside of left ear, bump at right side of nose , spot at left forehead and scalp he would like checked today. Patient has history of aks at scalp and forehead. ). ?The patient has spots, moles and lesions to be evaluated, some may be new or changing and the patient has concerns that these could be cancer. ? ?The following portions of the chart were reviewed this encounter and updated as appropriate:  Tobacco  Allergies  Meds  Problems  Med Hx  Surg Hx  Fam Hx   ?  ?Review of Systems: No other skin or systemic complaints except as noted in HPI or Assessment and Plan. ? ?Objective  ?Well appearing patient in no apparent distress; mood and affect are within normal limits. ? ?A focused examination was performed including face, scalp, nose, bilateral ears, . Relevant physical exam findings are noted in the Assessment and Plan. ? ?scalp x 10 (10) ?Erythematous thin papules/macules with gritty scale.  ? ?right nose ?0.6 cm pink papule  ? ? ? ? ? ? ? ? ? 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 They are chronic with expected duration over 1 year. A portion of actinic keratoses will progress to squamous cell carcinoma of the skin. It is not possible to reliably predict which spots will progress to skin cancer and so treatment is recommended to prevent development of skin cancer. ? ?Recommend daily broad spectrum sunscreen SPF 30+ to sun-exposed areas, reapply every 2 hours as needed.  ?Recommend staying in the shade or wearing long sleeves, sun glasses (UVA+UVB protection) and wide brim hats (4-inch brim around the entire circumference of the hat). ?Call for new or changing lesions. ? ?Destruction of lesion  - scalp x 10 ?Complexity: simple   ?Destruction method: cryotherapy   ?Informed consent: discussed and consent obtained   ?Timeout:  patient name, date of birth, surgical site, and procedure verified ?Lesion destroyed using liquid nitrogen: Yes   ?Region frozen until ice ball extended beyond lesion: Yes   ?Outcome: patient tolerated procedure well with no complications   ?Post-procedure details: wound care instructions given   ?Additional details:  Prior to procedure, discussed risks of blister formation, small wound, skin dyspigmentation, or rare scar following cryotherapy. Recommend Vaseline ointment to treated areas while healing. ? ?Neoplasm of uncertain behavior ?right nose ?Epidermal / dermal shaving ? ?Lesion diameter (cm):  0.6 ?Informed consent: discussed and consent obtained   ?Timeout: patient name, date of birth, surgical site, and procedure verified   ?Procedure prep:  Patient was prepped and draped in usual sterile fashion ?Prep type:  Isopropyl alcohol ?Anesthesia: the lesion was anesthetized in a standard fashion   ?Anesthetic:  1% lidocaine w/ epinephrine 1-100,000 buffered w/ 8.4% NaHCO3 ?Instrument used: flexible razor blade   ?Hemostasis achieved with: pressure, aluminum chloride and electrodesiccation   ?Outcome: patient tolerated procedure well   ?Post-procedure details: sterile dressing applied and wound care instructions given   ?Dressing type: bandage and petrolatum   ? ?Destruction of lesion - post tx defect 1.1 cm ?Complexity: extensive   ?Destruction method: electrodesiccation and curettage   ?Informed consent: discussed and consent obtained   ?Timeout:  patient name, date of birth, surgical site,  and procedure verified ?Procedure prep:  Patient was prepped and draped in usual sterile fashion ?Prep type:  Isopropyl alcohol ?Anesthesia: the lesion was anesthetized in a standard fashion   ?Anesthetic:  1% lidocaine w/ epinephrine 1-100,000 buffered w/ 8.4% NaHCO3 ?Curettage performed in  three different directions: Yes   ?Electrodesiccation performed over the curetted area: Yes   ?Hemostasis achieved with:  pressure, aluminum chloride and electrodesiccation ?Outcome: patient tolerated procedure well with no complications   ?Post-procedure details: sterile dressing applied and wound care instructions given   ?Dressing type: bandage and petrolatum   ?Additional details:  1.1 cm post defect  ? ?Specimen 1 - Surgical pathology ?Differential Diagnosis: r/o bcc  ?Check Margins: No ?R/o bcc  ? ?Seborrheic Keratoses ?- Stuck-on, waxy, tan-brown papules and/or plaques  ?- Benign-appearing ?- Discussed benign etiology and prognosis. ?- Observe ?- Call for any changes ? ?Actinic Damage ?- chronic, secondary to cumulative UV radiation exposure/sun exposure over time ?- diffuse scaly erythematous macules with underlying dyspigmentation ?- Recommend daily broad spectrum sunscreen SPF 30+ to sun-exposed areas, reapply every 2 hours as needed.  ?- Recommend staying in the shade or wearing long sleeves, sun glasses (UVA+UVB protection) and wide brim hats (4-inch brim around the entire circumference of the hat). ?- Call for new or changing lesions. ? ?History of Basal Cell Carcinoma of the Skin ?- No evidence of recurrence today multiple see history  ?- Recommend regular full body skin exams ?- Recommend daily broad spectrum sunscreen SPF 30+ to sun-exposed areas, reapply every 2 hours as needed.  ?- Call if any new or changing lesions are noted between office visits ? ?Return in about 6 months (around 07/14/2022) for tbse, h/o bcc  . ?Garry Heater, CMA, am acting as scribe for [name], MD. ?Documentation: I have reviewed the above documentation for accuracy and completeness, and I agree with the above. ? ?[name], MD ? ?

## 2022-01-12 ENCOUNTER — Encounter: Payer: Self-pay | Admitting: Dermatology

## 2022-01-13 DIAGNOSIS — M7022 Olecranon bursitis, left elbow: Secondary | ICD-10-CM | POA: Diagnosis not present

## 2022-01-13 DIAGNOSIS — M778 Other enthesopathies, not elsewhere classified: Secondary | ICD-10-CM | POA: Diagnosis not present

## 2022-01-13 DIAGNOSIS — M25522 Pain in left elbow: Secondary | ICD-10-CM | POA: Diagnosis not present

## 2022-01-14 ENCOUNTER — Telehealth: Payer: Self-pay

## 2022-01-14 NOTE — Telephone Encounter (Signed)
Patient informed of pathology results 

## 2022-01-14 NOTE — Telephone Encounter (Signed)
-----   Message from Ralene Bathe, MD sent at 01/12/2022  5:48 PM EDT ----- ?Diagnosis ?Skin , right nose ?BASAL CELL CARCINOMA, NODULAR PATTERN, BASE INVOLVED ? ?Cancer-BCC ?Already treated ?Recheck next visit ?

## 2022-01-17 DIAGNOSIS — G4733 Obstructive sleep apnea (adult) (pediatric): Secondary | ICD-10-CM | POA: Diagnosis not present

## 2022-01-19 DIAGNOSIS — Z79899 Other long term (current) drug therapy: Secondary | ICD-10-CM | POA: Diagnosis not present

## 2022-01-19 DIAGNOSIS — E1122 Type 2 diabetes mellitus with diabetic chronic kidney disease: Secondary | ICD-10-CM | POA: Diagnosis not present

## 2022-01-19 DIAGNOSIS — N1831 Chronic kidney disease, stage 3a: Secondary | ICD-10-CM | POA: Diagnosis not present

## 2022-01-19 DIAGNOSIS — I10 Essential (primary) hypertension: Secondary | ICD-10-CM | POA: Diagnosis not present

## 2022-01-19 DIAGNOSIS — E1165 Type 2 diabetes mellitus with hyperglycemia: Secondary | ICD-10-CM | POA: Diagnosis not present

## 2022-01-26 DIAGNOSIS — Z0000 Encounter for general adult medical examination without abnormal findings: Secondary | ICD-10-CM | POA: Diagnosis not present

## 2022-01-26 DIAGNOSIS — Z1331 Encounter for screening for depression: Secondary | ICD-10-CM | POA: Diagnosis not present

## 2022-01-26 DIAGNOSIS — E1142 Type 2 diabetes mellitus with diabetic polyneuropathy: Secondary | ICD-10-CM | POA: Diagnosis not present

## 2022-02-01 DIAGNOSIS — M25572 Pain in left ankle and joints of left foot: Secondary | ICD-10-CM | POA: Diagnosis not present

## 2022-02-01 DIAGNOSIS — T148XXA Other injury of unspecified body region, initial encounter: Secondary | ICD-10-CM | POA: Diagnosis not present

## 2022-02-16 DIAGNOSIS — G4733 Obstructive sleep apnea (adult) (pediatric): Secondary | ICD-10-CM | POA: Diagnosis not present

## 2022-03-19 ENCOUNTER — Encounter: Payer: Self-pay | Admitting: Physician Assistant

## 2022-03-19 ENCOUNTER — Ambulatory Visit: Payer: HMO | Admitting: Physician Assistant

## 2022-03-19 VITALS — BP 152/93 | HR 76 | Ht 69.0 in | Wt 223.0 lb

## 2022-03-19 DIAGNOSIS — G4733 Obstructive sleep apnea (adult) (pediatric): Secondary | ICD-10-CM | POA: Diagnosis not present

## 2022-03-19 DIAGNOSIS — R319 Hematuria, unspecified: Secondary | ICD-10-CM | POA: Diagnosis not present

## 2022-03-19 DIAGNOSIS — B3749 Other urogenital candidiasis: Secondary | ICD-10-CM

## 2022-03-19 LAB — MICROSCOPIC EXAMINATION
Bacteria, UA: NONE SEEN
Epithelial Cells (non renal): NONE SEEN /hpf (ref 0–10)

## 2022-03-19 LAB — URINALYSIS, COMPLETE
Bilirubin, UA: NEGATIVE
Ketones, UA: NEGATIVE
Leukocytes,UA: NEGATIVE
Nitrite, UA: NEGATIVE
Protein,UA: NEGATIVE
RBC, UA: NEGATIVE
Specific Gravity, UA: 1.015 (ref 1.005–1.030)
Urobilinogen, Ur: 0.2 mg/dL (ref 0.2–1.0)
pH, UA: 5.5 (ref 5.0–7.5)

## 2022-03-19 MED ORDER — FLUCONAZOLE 150 MG PO TABS: ORAL_TABLET | ORAL | 0 refills | Status: DC

## 2022-03-19 NOTE — Progress Notes (Signed)
03/19/2022 9:51 AM   Gillermina Phy 1946/06/18 [id number]  CC: Chief Complaint  Patient presents with   Hematuria   HPI: Jeremiah Zhang is a 76 y.o. 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   Today he reports an approximate 2 week history of soreness and redness of the glans penis and foreskin.  He has been using Neosporin but does not think that has helped.  He noted 3-4 spots of blood on his absorbent pad associated with the symptoms.  Today, he reports that he is having some increased sensitivity and burning discomfort along the ventral aspect of the penile shaft and scrotum.  Notably, he reports that he has been having issues with hyperglycemia after undergoing a steroid injection in his left elbow several months ago.  In-office UA today positive for 3+ glucose; urine microscopy pan negative.  PMH: Past Medical History:  Diagnosis Date   Actinic keratosis    Anticoagulant long-term use    ELIQUIS   Arthritis    Basal cell carcinoma 01/27/2010   Right spinal upper back. Superficial.    Basal cell carcinoma 11/15/2019   Left inferior antihelix post. to tragus. Superficial and nodular patterns. Excised: 11/27/2019   Basal cell carcinoma 11/15/2019   Left mid lateral nose. Nodular and infiltrative patterns. Excised: 11/27/2019   Basal cell carcinoma 11/27/2019   Left mid superior helix. BCC with focal sclerosis.   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 Slight atypia. Edges free.   ED (erectile dysfunction)    GERD (gastroesophageal reflux disease)    Hiatal hernia    History of DVT of lower extremity    POST OP  KNEE SURGERY   History of transient ischemic attack (TIA)    PER PT HX  ?POSSIBLE-- PT ON ELIQUIS   Hyperlipidemia    Hypertension    Hypogonadism male    OSA (obstructive sleep apnea)    per pt positive sleep study yrs ago recommended CPAP --  pt Non- compliant -- states uses Breath Rite Nasal Strips   Peripheral neuropathy    Prostate cancer [facility]) urologist-  dr [name]  oncologist-- dr [name]   dx 03-25-2015  T1c, Gleason 4+3  s/p  cryoablation prostate 06-02-2015/  external  radiation therapy 08-16-2016 to 09-20-2016/  scheduled for radioactive seed implants 10-22-2016   TIA (transient ischemic attack)    ?? 08/20/2014? Eliquis 07/02/15   Type 2 diabetes mellitus ([place])    followed by dr [name] [facility])  last A1c 8.4 on 07-27-2016 per lov note   Umbilical hernia    Wears glasses    Wears hearing aid    bilateral    Surgical History: Past Surgical History:  Procedure Laterality Date   COLONOSCOPY WITH PROPOFOL N/A 05/30/2017   Procedure: COLONOSCOPY WITH PROPOFOL;  Surgeon: [name], MD;  Location: [facility] ENDOSCOPY;  Service: Endoscopy;  Laterality: N/A;   COLONOSCOPY WITH PROPOFOL N/A 12/28/2021   Procedure: COLONOSCOPY WITH PROPOFOL;  Surgeon: Annamaria Helling, DO;  Location: [facility] ENDOSCOPY;  Service: Gastroenterology;  Laterality: N/A;   CRYOABLATION N/A 06/02/2015   Procedure: CRYO ABLATION PROSTATE;  Surgeon: Pierre Bali  [name], MD;  Location[facility];  Service: Urology;  Laterality: N/A;   CYSTOSCOPY WITH URETHRAL DILATATION N/A 05/04/2019   Procedure: CYSTOSCOPY WITH URETHRAL DILATATION;  Surgeon: [name], MD;  Location: [facility];  Service: Urology;  Laterality: N/A;   ELBOW BURSA SURGERY Left Feb 1997   prostate biopsies     x12, cryotherapy   RADIOACTIVE SEED IMPLANT N/A 10/22/2016   Procedure: RADIOACTIVE SEED IMPLANT/BRACHYTHERAPY IMPLANT, URETHRAL STRICTURE DIALIATION;  Surgeon: Carolan Clines, MD;  Location: [facility];  Service: Urology;  Laterality: N/A;   SHOULDER OPEN ROTATOR CUFF REPAIR Left 09-04-2014   skin cancer L ear     Dr [name]    TOTAL KNEE ARTHROPLASTY Bilateral right 08-13-2008  &  left [id number]   UMBILICAL HERNIA REPAIR  06-23-2012   ureter stretch  05/02/2019   [facility] Dr [name]    Home Medications:  Allergies as of 03/19/2022       Reactions   Bee Venom Anaphylaxis   Crestor [rosuvastatin] Other (See Comments)   "muscle weakness and discomfort"   Vytorin [ezetimibe-simvastatin] Other (See Comments)   "muscle weakness and discomfort"   Dynapen [dicloxacillin] Rash        Medication List        Accurate as of March 19, 2022  9:51 AM. If you have any questions, ask your nurse or doctor.          Accu-Chek FastClix Lancets Misc   ADVIL PO Take 2 tablets by mouth as needed.   amLODipine 5 MG tablet Commonly known as: NORVASC TAKE 1 TABLET(5 MG) BY MOUTH EVERY DAY   amLODipine 5 MG tablet Commonly known as: NORVASC Take by mouth.   apixaban 5 MG Tabs tablet Commonly known as: ELIQUIS TAKE 1 TABLET(5 MG) BY MOUTH TWICE DAILY   ascorbic acid 500 MG tablet Commonly known as: VITAMIN C Take 1,000 mg by mouth daily.   bisoprolol-hydrochlorothiazide 10-6.25 MG tablet Commonly known as: ZIAC TAKE 1 TABLET BY MOUTH EVERY DAY   EPINEPHrine 0.3 mg/0.3 mL Soaj injection Commonly known as: EPI-PEN Inject into the muscle as needed.   EPINEPHrine 0.3 mg/0.3 mL Soaj injection Commonly known as: EPI-PEN INJECT 0.3 MG INTO THE MUSCLE ONCE AS NEEDED FOR ANAPHYLAXIS   esomeprazole 40 MG capsule Commonly known as: NEXIUM Take 40 mg by mouth every evening.   EXCEDRIN PO Take 2-3 tablets by mouth. Rarely as needed for headache. Once in 3 mo max. +2 more in 1/2 if a severe headache.   Flowflex COVID-19 Ag Home Test Kit Generic drug: COVID-19 At Home Antigen Test TEST AS DIRECTED TODAY   fluconazole 150  MG tablet Commonly known as: DIFLUCAN Take one tablet by mouth every 3 days. Started by: Jeremiah Loop, PA-C   fluticasone 50 MCG/ACT nasal spray Commonly known as: FLONASE Place into both nostrils.   furosemide 40 MG tablet Commonly known as: LASIX Take 40 mg by mouth daily.   glimepiride 2 MG tablet Commonly known as: AMARYL TAKE 1 TABLET BY MOUTH TWICE DAILY   glucose blood test strip TEST TWICE DAILY AS INSTRUCTED   metFORMIN 1000 MG tablet Commonly known as: GLUCOPHAGE Take by mouth.   metFORMIN 1000 MG tablet Commonly known as: GLUCOPHAGE TAKE 1 TABLET(1000 MG) BY MOUTH TWICE DAILY WITH MEALS   Na Sulfate-K Sulfate-Mg Sulf 17.5-3.13-1.6 GM/177ML Soln See admin instructions.   quinapril 20 MG tablet Commonly known as: ACCUPRIL TAKE 1 TABLET BY MOUTH TWICE DAILY  RED YEAST RICE PO Take by mouth 2 (two) times daily.   sitaGLIPtin 100 MG tablet Commonly known as: JANUVIA Take by mouth.   Testosterone 20.25 MG/ACT (1.62%) Gel Apply 1 pump to 1 shoulder and 2 pumps to the other shoulder daily (3 pumps total)   tiZANidine 2 MG tablet Commonly known as: ZANAFLEX Take 2 mg by mouth 3 (three) times daily.   TYLENOL PO Take 2-3 tablets by mouth as needed.   Vitamin D3 50 MCG (2000 UT) Tabs Take 1 capsule by mouth daily.        Allergies:  Allergies  Allergen Reactions   Bee Venom Anaphylaxis   Crestor [Rosuvastatin] Other (See Comments)    "muscle weakness and discomfort"   Vytorin [Ezetimibe-Simvastatin] Other (See Comments)    "muscle weakness and discomfort"   Dynapen [Dicloxacillin] Rash    Family History: Family History  Problem Relation Age of Onset   Cancer Mother 68       colon/resection    Social History:   reports that he has never smoked. He has never used smokeless tobacco. He reports that he does not drink alcohol and does not use drugs.  Physical Exam: BP (!) 152/93   Pulse 76   Ht _0  (1.753 m)   Wt 223 lb (101.2  kg)   BMI 32.93 kg/m   Constitutional:  Alert and oriented, no acute distress, nontoxic appearing HEENT: [place], AT Cardiovascular: No clubbing, cyanosis, or edema Respiratory: Normal respiratory effort, no increased work of breathing GU: Erythema and slight edema of the glans penis and foreskin.  Tissue appears intact with no open wounds.  Bilateral descended testicles with nontender epididymides.  No scrotal induration, fluctuance, or crepitus. Skin: No rashes, bruises or suspicious lesions Neurologic: Grossly intact, no focal deficits, moving all 4 extremities Psychiatric: Normal mood and affect  Laboratory Data: Results for orders placed or performed in visit on 03/19/22  Microscopic Examination   Urine  Result Value Ref Range   WBC, UA 0-5 0 - 5 /hpf   RBC 0-2 0 - 2 /hpf   Epithelial Cells (non renal) None seen 0 - 10 /hpf   Bacteria, UA None seen None seen/Few  Urinalysis, Complete  Result Value Ref Range   Specific Gravity, UA 1.015 1.005 - 1.030   pH, UA 5.5 5.0 - 7.5   Color, UA Yellow Yellow   Appearance Ur Clear Clear   Leukocytes,UA Negative Negative   Protein,UA Negative Negative/Trace   Glucose, UA 3+ (A) Negative   Ketones, UA Negative Negative   RBC, UA Negative Negative   Bilirubin, UA Negative Negative   Urobilinogen, Ur 0.2 0.2 - 1.0 mg/dL   Nitrite, UA Negative Negative   Microscopic Examination See below:    Assessment & Plan:   1. Candidal balanoposthitis Physical exam is consistent with balanoposthitis, Candida is most likely causative organism.  Given marked involvement of the foreskin and glans penis, I think he would do better with oral therapy so I am prescribing 3 doses of Diflucan to treat.  I do not think that the blood he saw in his absorbent pad is likely to represent true gross hematuria, though he is at risk for radiation cystitis.  I think more likely his balanoposthitis caused some skin cracks or fissures that were bleeding.  I have asked him  to return to clinic if he continues to see blood, at which point we may pursue cystoscopy for further evaluation.  He expressed understanding. - Urinalysis, Complete -  fluconazole (DIFLUCAN) 150 MG tablet; Take one tablet by mouth every 3 days.  Dispense: 3 tablet; Refill: 0  Return if symptoms worsen or fail to improve.  Jeremiah Loop, PA-C  [facility] [address] [place], [place] [address] [phone]

## 2022-03-23 DIAGNOSIS — G4733 Obstructive sleep apnea (adult) (pediatric): Secondary | ICD-10-CM | POA: Diagnosis not present

## 2022-03-31 ENCOUNTER — Other Ambulatory Visit: Payer: Self-pay | Admitting: Physician Assistant

## 2022-03-31 DIAGNOSIS — B3749 Other urogenital candidiasis: Secondary | ICD-10-CM

## 2022-04-01 MED ORDER — FLUCONAZOLE 150 MG PO TABS: ORAL_TABLET | ORAL | 0 refills | Status: DC

## 2022-04-01 NOTE — Addendum Note (Signed)
Addended by: Tommy Rainwater on: 04/01/2022 01:39 PM   Modules accepted: Orders

## 2022-04-01 NOTE — Telephone Encounter (Signed)
Patient left a voicemail on the triage line stating that script was sent to wrong pharmacy and would like it sent to [facility] not [facility], script sent to [facility]

## 2022-04-05 DIAGNOSIS — H5203 Hypermetropia, bilateral: Secondary | ICD-10-CM | POA: Diagnosis not present

## 2022-04-05 DIAGNOSIS — Z7984 Long term (current) use of oral hypoglycemic drugs: Secondary | ICD-10-CM | POA: Diagnosis not present

## 2022-04-05 DIAGNOSIS — H2513 Age-related nuclear cataract, bilateral: Secondary | ICD-10-CM | POA: Diagnosis not present

## 2022-04-05 DIAGNOSIS — E119 Type 2 diabetes mellitus without complications: Secondary | ICD-10-CM | POA: Diagnosis not present

## 2022-04-05 DIAGNOSIS — D3132 Benign neoplasm of left choroid: Secondary | ICD-10-CM | POA: Diagnosis not present

## 2022-04-05 DIAGNOSIS — D3131 Benign neoplasm of right choroid: Secondary | ICD-10-CM | POA: Diagnosis not present

## 2022-04-05 DIAGNOSIS — H52223 Regular astigmatism, bilateral: Secondary | ICD-10-CM | POA: Diagnosis not present

## 2022-04-05 DIAGNOSIS — H524 Presbyopia: Secondary | ICD-10-CM | POA: Diagnosis not present

## 2022-04-05 DIAGNOSIS — H43813 Vitreous degeneration, bilateral: Secondary | ICD-10-CM | POA: Diagnosis not present

## 2022-04-09 DIAGNOSIS — E1122 Type 2 diabetes mellitus with diabetic chronic kidney disease: Secondary | ICD-10-CM | POA: Diagnosis not present

## 2022-04-09 DIAGNOSIS — E1165 Type 2 diabetes mellitus with hyperglycemia: Secondary | ICD-10-CM | POA: Diagnosis not present

## 2022-04-09 DIAGNOSIS — F321 Major depressive disorder, single episode, moderate: Secondary | ICD-10-CM | POA: Diagnosis not present

## 2022-04-09 DIAGNOSIS — C61 Malignant neoplasm of prostate: Secondary | ICD-10-CM | POA: Diagnosis not present

## 2022-04-09 DIAGNOSIS — N1831 Chronic kidney disease, stage 3a: Secondary | ICD-10-CM | POA: Diagnosis not present

## 2022-04-09 DIAGNOSIS — E291 Testicular hypofunction: Secondary | ICD-10-CM | POA: Diagnosis not present

## 2022-04-22 ENCOUNTER — Other Ambulatory Visit: Payer: Self-pay | Admitting: Family Medicine

## 2022-04-22 DIAGNOSIS — E291 Testicular hypofunction: Secondary | ICD-10-CM

## 2022-04-27 DIAGNOSIS — E1142 Type 2 diabetes mellitus with diabetic polyneuropathy: Secondary | ICD-10-CM | POA: Diagnosis not present

## 2022-04-27 DIAGNOSIS — M79675 Pain in left toe(s): Secondary | ICD-10-CM | POA: Diagnosis not present

## 2022-04-27 DIAGNOSIS — M79674 Pain in right toe(s): Secondary | ICD-10-CM | POA: Diagnosis not present

## 2022-04-27 DIAGNOSIS — B351 Tinea unguium: Secondary | ICD-10-CM | POA: Diagnosis not present

## 2022-05-06 ENCOUNTER — Other Ambulatory Visit: Payer: PPO

## 2022-05-06 DIAGNOSIS — E291 Testicular hypofunction: Secondary | ICD-10-CM

## 2022-05-07 ENCOUNTER — Encounter: Payer: Self-pay | Admitting: *Deleted

## 2022-05-07 ENCOUNTER — Telehealth: Payer: Self-pay | Admitting: Urology

## 2022-05-07 LAB — HEMATOCRIT: Hematocrit: 41.4 % (ref 37.5–51.0)

## 2022-05-07 LAB — TESTOSTERONE: Testosterone: 154 ng/dL — ABNORMAL LOW (ref 264–916)

## 2022-05-07 NOTE — Telephone Encounter (Signed)
Testosterone level was low at 155.  Is he still applying gel and if so how many pumps daily?

## 2022-05-24 DIAGNOSIS — E1165 Type 2 diabetes mellitus with hyperglycemia: Secondary | ICD-10-CM | POA: Diagnosis not present

## 2022-05-24 DIAGNOSIS — Z79899 Other long term (current) drug therapy: Secondary | ICD-10-CM | POA: Diagnosis not present

## 2022-05-31 DIAGNOSIS — N1831 Chronic kidney disease, stage 3a: Secondary | ICD-10-CM | POA: Diagnosis not present

## 2022-05-31 DIAGNOSIS — E1122 Type 2 diabetes mellitus with diabetic chronic kidney disease: Secondary | ICD-10-CM | POA: Diagnosis not present

## 2022-05-31 DIAGNOSIS — C61 Malignant neoplasm of prostate: Secondary | ICD-10-CM | POA: Diagnosis not present

## 2022-05-31 DIAGNOSIS — E1165 Type 2 diabetes mellitus with hyperglycemia: Secondary | ICD-10-CM | POA: Diagnosis not present

## 2022-05-31 DIAGNOSIS — E291 Testicular hypofunction: Secondary | ICD-10-CM | POA: Diagnosis not present

## 2022-05-31 DIAGNOSIS — E1142 Type 2 diabetes mellitus with diabetic polyneuropathy: Secondary | ICD-10-CM | POA: Diagnosis not present

## 2022-05-31 DIAGNOSIS — Z79899 Other long term (current) drug therapy: Secondary | ICD-10-CM | POA: Diagnosis not present

## 2022-06-17 DIAGNOSIS — E1165 Type 2 diabetes mellitus with hyperglycemia: Secondary | ICD-10-CM | POA: Diagnosis not present

## 2022-06-17 DIAGNOSIS — E1169 Type 2 diabetes mellitus with other specified complication: Secondary | ICD-10-CM | POA: Diagnosis not present

## 2022-06-17 DIAGNOSIS — E1159 Type 2 diabetes mellitus with other circulatory complications: Secondary | ICD-10-CM | POA: Diagnosis not present

## 2022-06-17 DIAGNOSIS — I152 Hypertension secondary to endocrine disorders: Secondary | ICD-10-CM | POA: Diagnosis not present

## 2022-06-17 DIAGNOSIS — E785 Hyperlipidemia, unspecified: Secondary | ICD-10-CM | POA: Diagnosis not present

## 2022-07-14 ENCOUNTER — Ambulatory Visit: Payer: HMO | Admitting: Dermatology

## 2022-07-22 ENCOUNTER — Ambulatory Visit (INDEPENDENT_AMBULATORY_CARE_PROVIDER_SITE_OTHER): Payer: HMO | Admitting: Dermatology

## 2022-07-22 DIAGNOSIS — Z85828 Personal history of other malignant neoplasm of skin: Secondary | ICD-10-CM

## 2022-07-22 DIAGNOSIS — Z1283 Encounter for screening for malignant neoplasm of skin: Secondary | ICD-10-CM | POA: Diagnosis not present

## 2022-07-22 DIAGNOSIS — D1801 Hemangioma of skin and subcutaneous tissue: Secondary | ICD-10-CM

## 2022-07-22 DIAGNOSIS — L820 Inflamed seborrheic keratosis: Secondary | ICD-10-CM

## 2022-07-22 DIAGNOSIS — L570 Actinic keratosis: Secondary | ICD-10-CM | POA: Diagnosis not present

## 2022-07-22 DIAGNOSIS — Z79899 Other long term (current) drug therapy: Secondary | ICD-10-CM

## 2022-07-22 DIAGNOSIS — Z5111 Encounter for antineoplastic chemotherapy: Secondary | ICD-10-CM

## 2022-07-22 DIAGNOSIS — L578 Other skin changes due to chronic exposure to nonionizing radiation: Secondary | ICD-10-CM | POA: Diagnosis not present

## 2022-07-22 DIAGNOSIS — L821 Other seborrheic keratosis: Secondary | ICD-10-CM

## 2022-07-22 DIAGNOSIS — L814 Other melanin hyperpigmentation: Secondary | ICD-10-CM

## 2022-07-22 DIAGNOSIS — D229 Melanocytic nevi, unspecified: Secondary | ICD-10-CM

## 2022-07-22 NOTE — Patient Instructions (Addendum)
Actinic keratoses are precancerous spots that appear secondary to cumulative UV radiation exposure/sun exposure over time. They are chronic with expected duration over 1 year. A portion of actinic keratoses will progress to squamous cell carcinoma of the skin. It is not possible to reliably predict which spots will progress to skin cancer and so treatment is recommended to prevent development of skin cancer.  Recommend daily broad spectrum sunscreen SPF 30+ to sun-exposed areas, reapply every 2 hours as needed.  Recommend staying in the shade or wearing long sleeves, sun glasses (UVA+UVB protection) and wide brim hats (4-inch brim around the entire circumference of the hat). Call for new or changing lesions.   Cryotherapy Aftercare  Wash gently with soap and water everyday.   Apply Vaseline and Band-Aid daily until healed.     Start November 1st  - Start 5-fluorouracil/calcipotriene cream twice a day for 7 days to affected areas including scalp. Prescription sent to [facility]. Patient advised they will receive an email to purchase the medication online and have it sent to their home. Patient provided with handout reviewing treatment course and side effects and advised to call or message [place] on MyChart with any concerns.  Reviewed course of treatment and expected reaction.  Patient advised to expect inflammation and crusting and advised that erosions are possible.  Patient advised to be diligent with sun protection during and after treatment. Counseled to keep medication out of reach of children and pets.  Instructions for [facility] Medications  One or more of your medications was sent to the [facility] mail order [facility]. You will receive an email from them and can purchase the medicine through that link. It will then be mailed to your home at the address you confirmed. If for any reason you do not receive an email from them, please check your  spam folder. If you still do not find the email, please let us know. [facility] phone number is [phone].   5-Fluorouracil/Calcipotriene Patient Education   Actinic keratoses are the dry, red scaly spots on the skin caused by sun damage. A portion of these spots can turn into skin cancer with time, and treating them can help prevent development of skin cancer.   Treatment of these spots requires removal of the defective skin cells. There are various ways to remove actinic keratoses, including freezing with liquid nitrogen, treatment with creams, or treatment with a blue light procedure in the office.   5-fluorouracil cream is a topical cream used to treat actinic keratoses. It works by interfering with the growth of abnormal fast-growing skin cells, such as actinic keratoses. These cells peel off and are replaced by healthy ones.   5-fluorouracil/calcipotriene is a combination of the 5-fluorouracil cream with a vitamin D analog cream called calcipotriene. The calcipotriene alone does not treat actinic keratoses. However, when it is combined with 5-fluorouracil, it helps the 5-fluorouracil treat the actinic keratoses much faster so that the same results can be achieved with a much shorter treatment time.  INSTRUCTIONS FOR 5-FLUOROURACIL/CALCIPOTRIENE CREAM:   5-fluorouracil/calcipotriene cream typically only needs to be used for 4-7 days. A thin layer should be applied twice a day to the treatment areas recommended by your physician.   If your physician prescribed you separate tubes of 5-fluourouracil and calcipotriene, apply a thin layer of 5-fluorouracil followed by a thin layer of calcipotriene.   Avoid contact with your eyes, nostrils, and mouth. Do not use 5-fluorouracil/calcipotriene cream on infected or open wounds.  You will develop redness, irritation and some crusting at areas where you have pre-cancer damage/actinic keratoses. IF YOU DEVELOP PAIN, BLEEDING, OR SIGNIFICANT  CRUSTING, STOP THE TREATMENT EARLY - you have already gotten a good response and the actinic keratoses should clear up well.  Wash your hands after applying 5-fluorouracil 5% cream on your skin.   A moisturizer or sunscreen with a minimum SPF 30 should be applied each morning.   Once you have finished the treatment, you can apply a thin layer of Vaseline twice a day to irritated areas to soothe and calm the areas more quickly. If you experience significant discomfort, contact your physician.  For some patients it is necessary to repeat the treatment for best results.  SIDE EFFECTS: When using 5-fluorouracil/calcipotriene cream, you may have mild irritation, such as redness, dryness, swelling, or a mild burning sensation. This usually resolves within 2 weeks. The more actinic keratoses you have, the more redness and inflammation you can expect during treatment. Eye irritation has been reported rarely. If this occurs, please let us know.  If you have any trouble using this cream, please call the office. If you have any other questions about this information, please do not hesitate to ask me before you leave the office.       Melanoma ABCDEs  Melanoma is the most dangerous type of skin cancer, and is the leading cause of death from skin disease.  You are more likely to develop melanoma if you: Have light-colored skin, light-colored eyes, or red or blond hair Spend a lot of time in the sun Tan regularly, either outdoors or in a tanning bed Have had blistering sunburns, especially during childhood Have a close family member who has had a melanoma Have atypical moles or large birthmarks  Early detection of melanoma is key since treatment is typically straightforward and cure rates are extremely high if we catch it early.   The first sign of melanoma is often a change in a mole or a new dark spot.  The ABCDE system is a way of remembering the signs of melanoma.  A for asymmetry:  The two  halves do not match. B for border:  The edges of the growth are irregular. C for color:  A mixture of colors are present instead of an even brown color. D for diameter:  Melanomas are usually (but not always) greater than 66m - the size of a pencil eraser. E for evolution:  The spot keeps changing in size, shape, and color.  Please check your skin once per month between visits. You can use a small mirror in front and a large mirror behind you to keep an eye on the back side or your body.   If you see any new or changing lesions before your next follow-up, please call to schedule a visit.  Please continue daily skin protection including broad spectrum sunscreen SPF 30+ to sun-exposed areas, reapplying every 2 hours as needed when you're outdoors.   Staying in the shade or wearing long sleeves, sun glasses (UVA+UVB protection) and wide brim hats (4-inch brim around the entire circumference of the hat) are also recommended for sun protection.    Due to recent changes in healthcare laws, you may see results of your pathology and/or laboratory studies on MyChart before the doctors have had a chance to review them. We understand that in some cases there may be results that are confusing or concerning to you. Please understand that not all results are received  at the same time and often the doctors may need to interpret multiple results in order to provide you with the best plan of care or course of treatment. Therefore, we ask that you please give [place] 2 business days to thoroughly review all your results before contacting the office for clarification. Should we see a critical lab result, you will be contacted sooner.   If You Need Anything After Your Visit  If you have any questions or concerns for your doctor, please call our main line at [phone] and press option 4 to reach your doctor's medical assistant. If no one answers, please leave a voicemail as directed and we will return your call as soon  as possible. Messages left after 4 pm will be answered the following business day.   You may also send [place] a message via [place]. We typically respond to MyChart messages within 1-2 business days.  For prescription refills, please ask your pharmacy to contact our office. Our fax number is [phone].  If you have an urgent issue when the clinic is closed that cannot wait until the next business day, you can page your doctor at the number below.    Please note that while we do our best to be available for urgent issues outside of office hours, we are not available 24/7.   If you have an urgent issue and are unable to reach [place], you may choose to seek medical care at your doctor's office, retail clinic, urgent care center, or emergency room.  If you have a medical emergency, please immediately call 911 or go to the emergency department.  Pager Numbers  - Dr. [name]: [phone]  - Dr. Laurence Ferrari: [phone]  - Dr. Nicole Kindred: [phone]  In the event of inclement weather, please call our main line at [phone] for an update on the status of any delays or closures.  Dermatology Medication Tips: Please keep the boxes that topical medications come in in order to help keep track of the instructions about where and how to use these. Pharmacies typically print the medication instructions only on the boxes and not directly on the medication tubes.   If your medication is too expensive, please contact our office at [phone] option 4 or send [place] a message through [place].   We are unable to tell what your co-pay for medications will be in advance as this is different depending on your insurance coverage. However, we may be able to find a substitute medication at lower cost or fill out paperwork to get insurance to cover a needed medication.   If a prior authorization is required to get your medication covered by your insurance company, please allow [place] 1-2 business days to complete this  process.  Drug prices often vary depending on where the prescription is filled and some pharmacies may offer cheaper prices.  The website www.goodrx.com contains coupons for medications through different pharmacies. The prices here do not account for what the cost may be with help from insurance (it may be cheaper with your insurance), but the website can give you the price if you did not use any insurance.  - You can print the associated coupon and take it with your prescription to the pharmacy.  - You may also stop by our office during regular business hours and pick up a GoodRx coupon card.  - If you need your prescription sent electronically to a different pharmacy, notify our office through [facility] or by phone at [phone] option 4.  Si Usted Necesita Algo Despus de Su Visita  Tambin puede enviarnos un mensaje a travs de Pharmacist, community. Por lo general respondemos a los mensajes de MyChart en el transcurso de 1 a 2 das hbiles.  Para renovar recetas, por favor pida a su farmacia que se ponga en contacto con nuestra oficina. Harland Dingwall de fax es [place] [phone].  Si tiene un asunto urgente cuando la clnica est cerrada y que no puede esperar hasta el siguiente da hbil, puede llamar/localizar a su doctor(a) al nmero que aparece a continuacin.   Por favor, tenga en cuenta que aunque hacemos todo lo posible para estar disponibles para asuntos urgentes fuera del horario de [place], no estamos disponibles las 24 horas del da, los 7 das de la [place].   Si tiene un problema urgente y no puede comunicarse con nosotros, puede optar por buscar atencin mdica  en el consultorio de su doctor(a), en una clnica privada, en un centro de atencin urgente o en una sala de emergencias.  Si tiene Engineering geologist, por favor llame inmediatamente al 911 o vaya a la sala de emergencias.  Nmeros de bper  - Dr. [name]: [phone]  - Dra. Moye: [phone]  - Dra.  Nicole Kindred: [phone]  En caso de inclemencias del [place], por favor llame a Johnsie Kindred principal al [phone] para una actualizacin sobre el [place] de cualquier retraso o cierre.  Consejos para la medicacin en dermatologa: Por favor, guarde las cajas en las que vienen los medicamentos de uso tpico para ayudarle a seguir las instrucciones sobre dnde y cmo usarlos. Las farmacias generalmente imprimen las instrucciones del medicamento slo en las cajas y no directamente en los tubos del [place].   Si su medicamento es muy caro, por favor, pngase en contacto con Zigmund Daniel llamando al [phone] y presione la opcin 4 o envenos un mensaje a travs de Pharmacist, community.   No podemos decirle cul ser su copago por los medicamentos por adelantado ya que esto es diferente dependiendo de la cobertura de su seguro. Sin embargo, es posible que podamos encontrar un medicamento sustituto a Electrical engineer un formulario para que el seguro cubra el medicamento que se considera necesario.   Si se requiere una autorizacin previa para que su compaa de seguros [place] su medicamento, por favor permtanos de 1 a 2 das hbiles para completar este proceso.  Los precios de los medicamentos varan con frecuencia dependiendo del Environmental consultant de dnde se surte la receta y alguna farmacias pueden ofrecer precios ms baratos.  El sitio web www.goodrx.com tiene cupones para medicamentos de Airline pilot. Los precios aqu no tienen en cuenta lo que podra costar con la ayuda del seguro (puede ser ms barato con su seguro), pero el sitio web puede darle el precio si no utiliz Research scientist (physical sciences).  - Puede imprimir el cupn correspondiente y llevarlo con su receta a la farmacia.  - Tambin puede pasar por nuestra oficina durante el horario de atencin regular y Charity fundraiser una tarjeta de cupones de GoodRx.  - Si necesita que su receta se enve electrnicamente a una farmacia diferente, informe a nuestra oficina a  travs de MyChart de [place] o por telfono llamando al [phone] y presione la opcin 4.

## 2022-07-22 NOTE — Progress Notes (Signed)
Follow-Up Visit   Subjective  Jeremiah Zhang is a 76 y.o. male who presents for the following: Annual Exam (6 month tbse , hx of bcc, hx of ak, patient reports rough spots at scalp , left ear noticed bump, mole at posterior left neck. ). The patient presents for Total-Body Skin Exam (TBSE) for skin cancer screening and mole check.  The patient has spots, moles and lesions to be evaluated, some may be new or changing and the patient has concerns that these could be cancer.  The following portions of the chart were reviewed this encounter and updated as appropriate:  Tobacco  Allergies  Meds  Problems  Med Hx  Surg Hx  Fam Hx     Review of Systems: No other skin or systemic complaints except as noted in HPI or Assessment and Plan.  Objective  Well appearing patient in no apparent distress; mood and affect are within normal limits.  A full examination was performed including scalp, head, eyes, ears, nose, lips, neck, chest, axillae, abdomen, back, buttocks, bilateral upper extremities, bilateral lower extremities, hands, feet, fingers, toes, fingernails, and toenails. All findings within normal limits unless otherwise noted below.  scalp x 17 (17) Erythematous thin papules/macules with gritty scale.   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 They are chronic with expected duration over 1 year. A portion of actinic keratoses will progress to squamous cell carcinoma of the skin. It is not possible to reliably predict which spots will progress to skin cancer and so treatment is recommended to prevent development of skin cancer.  Recommend daily broad spectrum sunscreen SPF 30+ to sun-exposed areas, reapply every 2 hours as needed.  Recommend staying in the shade or wearing long sleeves, sun  glasses (UVA+UVB protection) and wide brim hats (4-inch brim around the entire circumference of the hat). Call for new or changing lesions.  Destruction of lesion - scalp x 17 Complexity: simple   Destruction method: cryotherapy   Informed consent: discussed and consent obtained   Timeout:  patient name, date of birth, surgical site, and procedure verified Lesion destroyed using liquid nitrogen: Yes   Region frozen until ice ball extended beyond lesion: Yes   Outcome: patient tolerated procedure well with no complications   Post-procedure details: wound care instructions given   Additional details:  Prior to procedure, discussed risks of blister formation, small wound, skin dyspigmentation, or rare scar following cryotherapy. Recommend Vaseline ointment to treated areas while healing.  Inflamed seborrheic keratosis (4) scalp x 4 Symptomatic, irritating, patient would like treated. Destruction of lesion - scalp x 4 Complexity: simple   Destruction method: cryotherapy   Informed consent: discussed and consent obtained   Timeout:  patient name, date of birth, surgical site, and procedure verified Lesion destroyed using liquid nitrogen: Yes   Region frozen until ice ball extended beyond lesion: Yes   Outcome: patient tolerated procedure well with no complications   Post-procedure details: wound care instructions given   Additional details:  Prior to procedure, discussed risks of blister formation, small wound, skin dyspigmentation, or rare scar following cryotherapy. Recommend Vaseline ointment to treated areas while healing.  Lentigines - Scattered tan macules - Due to sun exposure - Benign-appearing, observe - Recommend daily broad spectrum sunscreen SPF 30+ to sun-exposed areas, reapply every 2 hours as  needed. - Call for any changes  Seborrheic Keratoses - Stuck-on, waxy, tan-brown papules and/or plaques  - Benign-appearing - Discussed benign etiology and prognosis. -  Observe - Call for any changes  Melanocytic Nevi - Tan-brown and/or pink-flesh-colored symmetric macules and papules - Benign appearing on exam today - Observation - Call clinic for new or changing moles - Recommend daily use of broad spectrum spf 30+ sunscreen to sun-exposed areas.   Hemangiomas - Red papules - Discussed benign nature - Observe - Call for any changes  History of Basal Cell Carcinoma of the Skin - No evidence of recurrence today - Recommend regular full body skin exams - Recommend daily broad spectrum sunscreen SPF 30+ to sun-exposed areas, reapply every 2 hours as needed.  - Call if any new or changing lesions are noted between office visits  Actinic Damage - Severe, confluent actinic changes with pre-cancerous actinic keratoses  - Severe, chronic, not at goal, secondary to cumulative UV radiation exposure over time - diffuse scaly erythematous macules and papules with underlying dyspigmentation - Discussed Prescription "Field Treatment" for Severe, Chronic Confluent Actinic Changes with Pre-Cancerous Actinic Keratoses Field treatment involves treatment of an entire area of skin that has confluent Actinic Changes (Sun/ Ultraviolet light damage) and PreCancerous Actinic Keratoses by method of PhotoDynamic Therapy (PDT) and/or prescription Topical Chemotherapy agents such as 5-fluorouracil, 5-fluorouracil/calcipotriene, and/or imiquimod.  The purpose is to decrease the number of clinically evident and subclinical PreCancerous lesions to prevent progression to development of skin cancer by chemically destroying early precancer changes that may or may not be visible.  It has been shown to reduce the risk of developing skin cancer in the treated area. As a result of treatment, redness, scaling, crusting, and open sores may occur during treatment course. One or more than one of these methods may be used and may have to be used several times to control, suppress and eliminate the  PreCancerous changes. Discussed treatment course, expected reaction, and possible side effects. - Recommend daily broad spectrum sunscreen SPF 30+ to sun-exposed areas, reapply every 2 hours as needed.  - Staying in the shade or wearing long sleeves, sun glasses (UVA+UVB protection) and wide brim hats (4-inch brim around the entire circumference of the hat) are also recommended. - Call for new or changing lesions.  Start November 1  - Start 5-fluorouracil/calcipotriene cream twice a day for 7 days to affected areas including scalp. Prescription sent to [facility]. Patient advised they will receive an email to purchase the medication online and have it sent to their home. Patient provided with handout reviewing treatment course and side effects and advised to call or message [place] on MyChart with any concerns.  Reviewed course of treatment and expected reaction.  Patient advised to expect inflammation and crusting and advised that erosions are possible.  Patient advised to be diligent with sun protection during and after treatment. Counseled to keep medication out of reach of children and pets.  Skin cancer screening performed today. Return in about 6 months (around 01/21/2023) for TBSE. IRuthell Rummage, CMA, am acting as scribe for [name], MD. Documentation: I have reviewed the above documentation for accuracy and completeness, and I agree with the above.  [name], MD

## 2022-07-27 ENCOUNTER — Encounter: Payer: Self-pay | Admitting: Dermatology

## 2022-07-28 ENCOUNTER — Other Ambulatory Visit
Admission: RE | Admit: 2022-07-28 | Discharge: 2022-07-28 | Disposition: A | Discharge status: Home / Self Care | Payer: HMO | Source: Ambulatory Visit | Attending: Orthopedic Surgery | Admitting: Orthopedic Surgery

## 2022-07-28 DIAGNOSIS — M7022 Olecranon bursitis, left elbow: Secondary | ICD-10-CM | POA: Insufficient documentation

## 2022-08-01 LAB — BODY FLUID CULTURE W GRAM STAIN: Culture: NO GROWTH

## 2022-08-04 ENCOUNTER — Other Ambulatory Visit
Admission: RE | Admit: 2022-08-04 | Discharge: 2022-08-04 | Disposition: A | Discharge status: Home / Self Care | Payer: HMO | Source: Ambulatory Visit | Attending: Sports Medicine | Admitting: Sports Medicine

## 2022-08-04 DIAGNOSIS — M25422 Effusion, left elbow: Secondary | ICD-10-CM | POA: Diagnosis present

## 2022-08-04 DIAGNOSIS — M25522 Pain in left elbow: Secondary | ICD-10-CM | POA: Diagnosis present

## 2022-08-04 DIAGNOSIS — M7022 Olecranon bursitis, left elbow: Secondary | ICD-10-CM | POA: Insufficient documentation

## 2022-08-04 LAB — SYNOVIAL CELL COUNT + DIFF, W/ CRYSTALS
Crystals, Fluid: NONE SEEN
Eosinophils-Synovial: 0 %
Lymphocytes-Synovial Fld: 14 %
Monocyte-Macrophage-Synovial Fluid: 26 %
Neutrophil, Synovial: 60 %
WBC, Synovial: 31724 /mm3 — ABNORMAL HIGH (ref 0–200)

## 2022-08-09 ENCOUNTER — Ambulatory Visit (INDEPENDENT_AMBULATORY_CARE_PROVIDER_SITE_OTHER): Payer: HMO | Admitting: Podiatry

## 2022-08-09 ENCOUNTER — Ambulatory Visit (INDEPENDENT_AMBULATORY_CARE_PROVIDER_SITE_OTHER): Payer: PPO

## 2022-08-09 DIAGNOSIS — M79674 Pain in right toe(s): Secondary | ICD-10-CM

## 2022-08-09 DIAGNOSIS — M79675 Pain in left toe(s): Secondary | ICD-10-CM

## 2022-08-09 DIAGNOSIS — S91332A Puncture wound without foreign body, left foot, initial encounter: Secondary | ICD-10-CM | POA: Diagnosis not present

## 2022-08-09 DIAGNOSIS — E1149 Type 2 diabetes mellitus with other diabetic neurological complication: Secondary | ICD-10-CM | POA: Diagnosis not present

## 2022-08-09 DIAGNOSIS — T1490XA Injury, unspecified, initial encounter: Secondary | ICD-10-CM

## 2022-08-09 DIAGNOSIS — L84 Corns and callosities: Secondary | ICD-10-CM

## 2022-08-09 DIAGNOSIS — B351 Tinea unguium: Secondary | ICD-10-CM | POA: Diagnosis not present

## 2022-08-09 NOTE — Progress Notes (Unsigned)
Subjective: 76 y.o. returns the office today for painful, elongated, thickened toenails which he cannot trim himself.  No swelling, redness or any drainage to the toenail sites.  Side he states he had stepped on a finishing nail and went through his shoe into his foot.  This occurred a few weeks ago.  He has a callus to the area but denies any swelling redness or any drainage.  His tetanus he states is up-to-date. PCP: [name], MD Seen July 27, 2021  Last A1c was 10 on May 25, 2022  Objective: AAO 3, NAD DP/PT pulses palpable, CRT less than 3 seconds Nails hypertrophic, dystrophic, elongated, brittle, discolored 10. There is tenderness overlying the nails 1-5 bilaterally. There is no surrounding erythema or drainage along the nail sites.  Second digit nails remain hypertrophic and dystrophic.   On the plantar aspect left heel is a hyperkeratotic lesion.  Upon debridement there is no evidence of puncture wound, ulceration.  No edema, erythema or signs of infection. Decreased under motion of the first MPJ, bunions.  No significant pain.   No pain with calf compression, swelling, warmth, erythema.  Assessment: Patient presents with symptomatic onychomycosis, puncture wound  Plan: -Treatment options including alternatives, risks, complications were discussed -X-rays were obtained and reviewed.  3 views of the foot were obtained.  No evidence of acute fracture, foreign body or osteomyelitis. -Sharply debrided callus in the heel without any complications or bleeding.  No signs of infection at this time but he still needs to monitor this very closely especially given his elevated A1c.  There is no signs of infection this occurred few weeks ago would hold off on antibiotics will monitor. -Nails sharply debrided 10 without complication/bleeding. -Discussed daily foot inspection, glucose control.  Trula Slade DPM

## 2022-08-16 ENCOUNTER — Encounter (INDEPENDENT_AMBULATORY_CARE_PROVIDER_SITE_OTHER): Payer: Self-pay

## 2022-08-18 ENCOUNTER — Other Ambulatory Visit: Payer: Self-pay | Admitting: Family Medicine

## 2022-08-20 MED ORDER — TESTOSTERONE 20.25 MG/ACT (1.62%) TD GEL: TRANSDERMAL | 1 refills | Status: DC

## 2022-08-24 ENCOUNTER — Telehealth: Payer: Self-pay | Admitting: Family Medicine

## 2022-08-24 NOTE — Telephone Encounter (Signed)
Patient notified Testosterone 20.25 gel has been approved Approval date: 08/24/2022-08/25/2023 BP#[id number]

## 2022-10-19 ENCOUNTER — Emergency Department (HOSPITAL_COMMUNITY): Payer: PPO

## 2022-10-19 ENCOUNTER — Emergency Department (HOSPITAL_COMMUNITY)
Admission: EM | Admit: 2022-10-19 | Discharge: 2022-10-20 | Discharge status: Against Medical Advice | Payer: PPO | Attending: Emergency Medicine | Admitting: Emergency Medicine

## 2022-10-19 ENCOUNTER — Other Ambulatory Visit: Payer: Self-pay

## 2022-10-19 ENCOUNTER — Encounter (HOSPITAL_COMMUNITY): Payer: Self-pay

## 2022-10-19 DIAGNOSIS — Z8546 Personal history of malignant neoplasm of prostate: Secondary | ICD-10-CM | POA: Diagnosis not present

## 2022-10-19 DIAGNOSIS — Z7984 Long term (current) use of oral hypoglycemic drugs: Secondary | ICD-10-CM | POA: Diagnosis not present

## 2022-10-19 DIAGNOSIS — E119 Type 2 diabetes mellitus without complications: Secondary | ICD-10-CM | POA: Insufficient documentation

## 2022-10-19 DIAGNOSIS — W19XXXA Unspecified fall, initial encounter: Secondary | ICD-10-CM | POA: Insufficient documentation

## 2022-10-19 DIAGNOSIS — S0993XA Unspecified injury of face, initial encounter: Secondary | ICD-10-CM | POA: Diagnosis not present

## 2022-10-19 DIAGNOSIS — Z79899 Other long term (current) drug therapy: Secondary | ICD-10-CM | POA: Diagnosis not present

## 2022-10-19 DIAGNOSIS — M2578 Osteophyte, vertebrae: Secondary | ICD-10-CM | POA: Diagnosis not present

## 2022-10-19 DIAGNOSIS — S199XXA Unspecified injury of neck, initial encounter: Secondary | ICD-10-CM | POA: Diagnosis not present

## 2022-10-19 DIAGNOSIS — M4312 Spondylolisthesis, cervical region: Secondary | ICD-10-CM | POA: Diagnosis not present

## 2022-10-19 DIAGNOSIS — M19011 Primary osteoarthritis, right shoulder: Secondary | ICD-10-CM | POA: Diagnosis not present

## 2022-10-19 DIAGNOSIS — M25531 Pain in right wrist: Secondary | ICD-10-CM | POA: Diagnosis not present

## 2022-10-19 DIAGNOSIS — S0990XA Unspecified injury of head, initial encounter: Secondary | ICD-10-CM | POA: Diagnosis not present

## 2022-10-19 DIAGNOSIS — I10 Essential (primary) hypertension: Secondary | ICD-10-CM | POA: Diagnosis not present

## 2022-10-19 DIAGNOSIS — M25532 Pain in left wrist: Secondary | ICD-10-CM | POA: Diagnosis not present

## 2022-10-19 DIAGNOSIS — Z7901 Long term (current) use of anticoagulants: Secondary | ICD-10-CM | POA: Diagnosis not present

## 2022-10-19 DIAGNOSIS — M25512 Pain in left shoulder: Secondary | ICD-10-CM | POA: Diagnosis not present

## 2022-10-19 DIAGNOSIS — S0003XA Contusion of scalp, initial encounter: Secondary | ICD-10-CM | POA: Diagnosis not present

## 2022-10-19 LAB — COMPREHENSIVE METABOLIC PANEL
ALT: 20 U/L (ref 0–44)
AST: 21 U/L (ref 15–41)
Albumin: 4.5 g/dL (ref 3.5–5.0)
Alkaline Phosphatase: 52 U/L (ref 38–126)
Anion gap: 12 (ref 5–15)
BUN: 25 mg/dL — ABNORMAL HIGH (ref 8–23)
CO2: 25 mmol/L (ref 22–32)
Calcium: 9.4 mg/dL (ref 8.9–10.3)
Chloride: 104 mmol/L (ref 98–111)
Creatinine, Ser: 1.58 mg/dL — ABNORMAL HIGH (ref 0.61–1.24)
GFR, Estimated: 44 mL/min — ABNORMAL LOW (ref >60)
Glucose, Bld: 98 mg/dL (ref 70–99)
Potassium: 3.9 mmol/L (ref 3.5–5.1)
Sodium: 141 mmol/L (ref 135–145)
Total Bilirubin: 0.5 mg/dL (ref 0.3–1.2)
Total Protein: 7.9 g/dL (ref 6.5–8.1)

## 2022-10-19 LAB — CBC
HCT: 43.9 % (ref 39.0–52.0)
Hemoglobin: 15 g/dL (ref 13.0–17.0)
MCH: 32.1 pg (ref 26.0–34.0)
MCHC: 34.2 g/dL (ref 30.0–36.0)
MCV: 94 fL (ref 80.0–100.0)
Platelets: 294 10*3/uL (ref 150–400)
RBC: 4.67 MIL/uL (ref 4.22–5.81)
RDW: 13.6 % (ref 11.5–15.5)
WBC: 9.1 10*3/uL (ref 4.0–10.5)
nRBC: 0 % (ref 0.0–0.2)

## 2022-10-19 LAB — PROTIME-INR
INR: 1 (ref 0.8–1.2)
Prothrombin Time: 13.3 seconds (ref 11.4–15.2)

## 2022-10-19 NOTE — ED Triage Notes (Signed)
Patient reports that he was helping someone clean their garage out and thinks he tripped on a box or something. Patient states he fell face first and hit his head on the steps.  Patient has a large hematoma to the head. Patient c/o  shoulder pain, arm pain, and facial pain.  Patient is currently taking Eliquis.   Patient  states he has unsteady gait.

## 2022-10-19 NOTE — ED Provider Triage Note (Signed)
Emergency Medicine Provider Triage Evaluation Note  Jeremiah Zhang , a 77 y.o. male  was evaluated in triage.  Pt complains of falling and hitting his head around 1230.  Patient is on Eliquis and notes a hematoma on his right forehead. Patient also notes pain in bilateral wrists and bilateral shoulders along with his neck.  Patient states he feels unstable when he is upright.   Review of Systems  Positive: See HPI, head pain Negative: Chest pain, shortness of breath, abdominal pain, vision changes, weakness, changes in motor skills  Physical Exam  There were no vitals taken for this visit. Gen:   Awake, no distress HENT:  Hematoma on the right frontal area with bleeding that is TTP, no fracture palpated Resp:  Normal effort, CTAB MSK:   Moves extremities without difficulty, 5/5 bilateral grip strength, tender to palpation on bilateral midshaft radial bones, hematoma noted on L radial bone midshaft, bilateral shoulders tender to palpation, neck tender to palpation Neuro:  Nerves II-XII are intact, patient did note neck pain with flexion, AO x 3 (person, place, events), eyes PERRLA  Medical Decision Making  Medically screening exam initiated at 1:03 PM.  Appropriate orders placed.  Jeremiah Zhang was informed that the remainder of the evaluation will be completed by another provider, this initial triage assessment does not replace that evaluation, and the importance of remaining in the ED until their evaluation is complete.   Chuck Hint, PA-C 10/19/22 1331

## 2022-10-20 ENCOUNTER — Encounter (HOSPITAL_COMMUNITY): Payer: Self-pay | Admitting: Emergency Medicine

## 2022-10-20 MED ORDER — BACITRACIN ZINC 500 UNIT/GM EX OINT: TOPICAL_OINTMENT | Freq: Two times a day (BID) | CUTANEOUS | Status: DC

## 2022-10-20 NOTE — ED Provider Notes (Signed)
[facility] DEPT Provider Note   CSN: [id number] Arrival date & time: 10/19/22  1227     Histor Chief Complaint  Patient presents with   Fall   Head Injury    Jeremiah Zhang is a 77 y.o. male who presents after fall on anticoagulation with Eliquis.  States that he was in the garage and tripped over something and fell and hit his right forehead on the wooden steps.  No LOC nausea vomiting blurry double vision since that time.  Also states that he caught himself on his outstretched arms, some soreness in the shoulders and wrists.  I reviewed his medical records.  Has history of type 2 diabetes, hypertension, GERD, history of DVT, history of TIA, prostate cancer, anticoagulated on Eliquis.  HPI     Home Medications Prior to Admission medications   Medication Sig Start Date End Date Taking? Authorizing Provider  ACCU-CHEK FASTCLIX LANCETS [place]  10/29/16   [provider]  Acetaminophen (TYLENOL PO) Take 2-3 tablets by mouth as needed.    [provider]  amLODipine (NORVASC) 5 MG tablet TAKE 1 TABLET(5 MG) BY MOUTH EVERY DAY 12/17/19   [provider]  amLODipine (NORVASC) 5 MG tablet Take by mouth. 03/10/21   [provider]  apixaban (ELIQUIS) 5 MG TABS tablet TAKE 1 TABLET(5 MG) BY MOUTH TWICE DAILY 10/01/19   [provider]  ascorbic acid (VITAMIN C) 500 MG tablet Take 1,000 mg by mouth daily.    [provider]  Aspirin-Acetaminophen-Caffeine (EXCEDRIN PO) Take 2-3 tablets by mouth. Rarely as needed for headache. Once in 3 mo max. +2 more in 1/2 if a severe headache.    [provider]  bisoprolol-hydrochlorothiazide (ZIAC) 10-6.25 MG tablet TAKE 1 TABLET BY MOUTH EVERY DAY 10/01/19   [provider]  Cholecalciferol (VITAMIN D3) 2000 UNITS TABS Take 1 capsule by mouth daily.    [provider]  EPINEPHrine 0.3 mg/0.3 mL IJ SOAJ injection Inject into the muscle as needed.     [provider]  EPINEPHrine 0.3 mg/0.3 mL IJ SOAJ injection INJECT 0.3 MG INTO THE MUSCLE ONCE AS NEEDED FOR ANAPHYLAXIS 10/10/19   [provider]  esomeprazole (NEXIUM) 40 MG capsule Take 40 mg by mouth every evening.     [provider]  [facility] TEST KIT TEST AS DIRECTED TODAY 04/23/21   [provider]  fluconazole (DIFLUCAN) 150 MG tablet Take one tablet by mouth every 3 days. 04/01/22   Vaillancourt, Aldona Bar, PA-C  fluticasone (FLONASE) 50 MCG/ACT nasal spray Place into both nostrils. 10/01/20   [provider]  furosemide (LASIX) 40 MG tablet Take 40 mg by mouth daily.  11/15/19   [provider]  glimepiride (AMARYL) 2 MG tablet TAKE 1 TABLET BY MOUTH TWICE DAILY 07/03/12   [provider]  glucose blood test strip TEST TWICE DAILY AS INSTRUCTED 10/29/16   [provider]  Ibuprofen (ADVIL PO) Take 2 tablets by mouth as needed.    [provider]  metFORMIN (GLUCOPHAGE) 1000 MG tablet TAKE 1 TABLET(1000 MG) BY MOUTH TWICE DAILY WITH MEALS 10/01/19   [provider]  metFORMIN (GLUCOPHAGE) 1000 MG tablet Take by mouth.    [provider]  Na Sulfate-K Sulfate-Mg Sulf 17.5-3.13-1.6 GM/177ML SOLN See admin instructions. 07/07/21   [provider]  NOVOLOG FLEXPEN 100 UNIT/ML FlexPen SMARTSIG:10 Unit(s) SUB-Q 3 Times Daily 08/02/22   [provider]  quinapril (ACCUPRIL) 20 MG  tablet TAKE 1 TABLET BY MOUTH TWICE DAILY 07/03/12   [provider]  Red Yeast Rice Extract (RED YEAST RICE PO) Take by mouth 2 (two) times daily.    [provider]  sitaGLIPtin (JANUVIA) 100 MG tablet Take by mouth. 01/01/20 12/31/20  [provider]  Testosterone 20.25 MG/ACT (1.62%) GEL Apply 1 pump to 1 shoulder and 2 pumps to the other shoulder daily (3 pumps total) 08/20/22   Stoioff, Ronda Fairly, MD  tiZANidine (ZANAFLEX) 2 MG tablet Take 2 mg by mouth 3 (three)  times daily. 01/21/21   [provider]      Allergies    Bee venom, Crestor [rosuvastatin], Vytorin [ezetimibe-simvastatin], and Dynapen [dicloxacillin]    Review of Systems   Review of Systems  Eyes: Negative.   Musculoskeletal:  Positive for myalgias. Negative for neck pain.  Neurological:  Positive for headaches.    Physical Exam Updated Vital Signs BP (!) 156/101 (BP Location: Left Arm)   Pulse (!) 120   Temp 98.4 F (36.9 C) (Oral)   Resp 18   Ht _0  (1.727 m)   Wt 104 kg   SpO2 98%   BMI 34.85 kg/m  Physical Exam Vitals and nursing note reviewed.  Constitutional:      Appearance: He is not ill-appearing or toxic-appearing.  HENT:     Head:     Jaw: There is normal jaw occlusion.      Nose: Nose normal.     Mouth/Throat:     Mouth: Mucous membranes are moist.     Pharynx: Oropharynx is clear. Uvula midline. No oropharyngeal exudate or posterior oropharyngeal erythema.     Tonsils: No tonsillar exudate.  Eyes:     General: Lids are normal. Vision grossly intact.        Right eye: No discharge.        Left eye: No discharge.     Extraocular Movements: Extraocular movements intact.     Conjunctiva/sclera: Conjunctivae normal.     Pupils: Pupils are equal, round, and reactive to light.  Neck:     Trachea: Trachea and phonation normal.     Comments: Spasm and musculature with palpation of the cervical paraspinous musculature bilaterally without midline tenderness palpation. Cardiovascular:     Rate and Rhythm: Normal rate and regular rhythm.     Pulses: Normal pulses.     Heart sounds: Normal heart sounds. No murmur heard. Pulmonary:     Effort: Pulmonary effort is normal. No tachypnea, bradypnea, accessory muscle usage, prolonged expiration or respiratory distress.     Breath sounds: Normal breath sounds. No wheezing or rales.  Chest:     Chest wall: No mass, lacerations, deformity, swelling, tenderness, crepitus or edema.  Abdominal:     General:  Bowel sounds are normal. There is no distension.     Palpations: Abdomen is soft.     Tenderness: There is no abdominal tenderness. There is no guarding or rebound.  Musculoskeletal:        General: No deformity.     Right shoulder: Normal.     Left shoulder: Tenderness present. Normal range of motion.     Right upper arm: Normal.     Left upper arm: Normal.     Right elbow: Normal.     Left elbow: Normal.     Right forearm: Normal.     Left forearm: Normal.     Right wrist: Normal. No snuff box tenderness.     Left  wrist: Normal. No snuff box tenderness.     Right hand: Normal.     Left hand: Normal.       Hands:     Cervical back: Normal range of motion and neck supple. Spasms and tenderness present. No edema, rigidity, bony tenderness or crepitus. Muscular tenderness present. No pain with movement or spinous process tenderness.     Thoracic back: Normal. No bony tenderness.     Lumbar back: Normal. No bony tenderness.     Right hip: Normal.     Left hip: Normal.     Right upper leg: Normal.     Left upper leg: Normal.     Right knee: Normal.     Left knee: Normal.     Right lower leg: Normal. No edema.     Left lower leg: Normal. No edema.     Right ankle: Normal.     Right Achilles Tendon: Normal.     Left ankle: Normal.     Left Achilles Tendon: Normal.     Right foot: Normal.     Left foot: Normal.     Comments: Spasm and TTP of the left trapezius without midline TTP of the C,T, or Lspine.     Lymphadenopathy:     Cervical: No cervical adenopathy.  Skin:    General: Skin is warm and dry.     Capillary Refill: Capillary refill takes less than 2 seconds.     Findings: Abrasion present.  Neurological:     General: No focal deficit present.     Mental Status: He is alert and oriented to person, place, and time. Mental status is at baseline.  Psychiatric:        Mood and Affect: Mood normal.     ED Results / Procedures / Treatments   Labs (all labs ordered are  listed, but only abnormal results are displayed) Labs Reviewed  COMPREHENSIVE METABOLIC PANEL - Abnormal; Notable for the following components:      Result Value   BUN 25 (*)    Creatinine, Ser 1.58 (*)    GFR, Estimated 44 (*)    All other components within normal limits  CBC  PROTIME-INR    EKG EKG Interpretation  Date/Time:  Tuesday October 19 2022 14:52:45 EST Ventricular Rate:  101 PR Interval:  177 QRS Duration: 88 QT Interval:  342 QTC Calculation: 444 R Axis:   248 Text Interpretation: Sinus tachycardia Left anterior fascicular block Abnormal R-wave progression, late transition no sig change from previous Confirmed by Charlesetta Shanks [phone]) on 10/19/2022 4:13:04 PM  Radiology CT Head Wo Contrast  Result Date: 10/19/2022 CLINICAL DATA:  Fall EXAM: CT HEAD WITHOUT CONTRAST CT MAXILLOFACIAL WITHOUT CONTRAST CT CERVICAL SPINE WITHOUT CONTRAST TECHNIQUE: Multidetector CT imaging of the head, cervical spine, and maxillofacial structures were performed using the standard protocol without intravenous contrast. Multiplanar CT image reconstructions of the cervical spine and maxillofacial structures were also generated. RADIATION DOSE REDUCTION: This exam was performed according to the departmental dose-optimization program which includes automated exposure control, adjustment of the mA and/or kV according to patient size and/or use of iterative reconstruction technique. COMPARISON:  None Available. FINDINGS: CT HEAD FINDINGS Brain: No evidence of acute infarction, hemorrhage, hydrocephalus, extra-axial collection or mass lesion/mass effect. Vascular: No hyperdense vessel or unexpected calcification. Skull: Normal. Negative for fracture or focal lesion. Other: None. CT MAXILLOFACIAL FINDINGS Osseous: No fracture or mandibular dislocation. No destructive process. Orbits: Negative. No traumatic or inflammatory finding. Sinuses: Clear. Soft  tissues: There is a soft tissue hematoma along the right  frontal scalp. CT CERVICAL SPINE FINDINGS Alignment: Grade 1 anterolisthesis of C6 on C7. Skull base and vertebrae: Along the anterior aspect of the C7 superior endplate there is a poorly corticated osteophyte (series 7, image 33). Soft tissues and spinal canal: No prevertebral fluid or swelling. No visible canal hematoma. Disc levels: No evidence of high-grade spinal canal stenosis. Multilevel degenerative changes with severe neural foraminal stenosis C4-C5. Upper chest: Negative. Other: None IMPRESSION: CT HEAD: No acute intracranial abnormality. CT MAXILLOFACIAL: 1. No acute facial bone fracture. 2. Soft tissue hematoma along the right frontal scalp. CT CERVICAL SPINE: Along the anterior aspect of the C7 superior endplate there is a poorly corticated osteophyte, which is favored to be chronic, but in the setting of trauma an acute osteophyte fracture is not excluded. Correlate with point tenderness and consider further evaluation with a cervical spine MRI. Electronically Signed   By: Marin Roberts M.D.   On: 10/19/2022 14:27   CT Cervical Spine Wo Contrast  Result Date: 10/19/2022 CLINICAL DATA:  Fall EXAM: CT HEAD WITHOUT CONTRAST CT MAXILLOFACIAL WITHOUT CONTRAST CT CERVICAL SPINE WITHOUT CONTRAST TECHNIQUE: Multidetector CT imaging of the head, cervical spine, and maxillofacial structures were performed using the standard protocol without intravenous contrast. Multiplanar CT image reconstructions of the cervical spine and maxillofacial structures were also generated. RADIATION DOSE REDUCTION: This exam was performed according to the departmental dose-optimization program which includes automated exposure control, adjustment of the mA and/or kV according to patient size and/or use of iterative reconstruction technique. COMPARISON:  None Available. FINDINGS: CT HEAD FINDINGS Brain: No evidence of acute infarction, hemorrhage, hydrocephalus, extra-axial collection or mass lesion/mass effect. Vascular: No  hyperdense vessel or unexpected calcification. Skull: Normal. Negative for fracture or focal lesion. Other: None. CT MAXILLOFACIAL FINDINGS Osseous: No fracture or mandibular dislocation. No destructive process. Orbits: Negative. No traumatic or inflammatory finding. Sinuses: Clear. Soft tissues: There is a soft tissue hematoma along the right frontal scalp. CT CERVICAL SPINE FINDINGS Alignment: Grade 1 anterolisthesis of C6 on C7. Skull base and vertebrae: Along the anterior aspect of the C7 superior endplate there is a poorly corticated osteophyte (series 7, image 33). Soft tissues and spinal canal: No prevertebral fluid or swelling. No visible canal hematoma. Disc levels: No evidence of high-grade spinal canal stenosis. Multilevel degenerative changes with severe neural foraminal stenosis C4-C5. Upper chest: Negative. Other: None IMPRESSION: CT HEAD: No acute intracranial abnormality. CT MAXILLOFACIAL: 1. No acute facial bone fracture. 2. Soft tissue hematoma along the right frontal scalp. CT CERVICAL SPINE: Along the anterior aspect of the C7 superior endplate there is a poorly corticated osteophyte, which is favored to be chronic, but in the setting of trauma an acute osteophyte fracture is not excluded. Correlate with point tenderness and consider further evaluation with a cervical spine MRI. Electronically Signed   By: Marin Roberts M.D.   On: 10/19/2022 14:27   CT Maxillofacial Wo Contrast  Result Date: 10/19/2022 CLINICAL DATA:  Fall EXAM: CT HEAD WITHOUT CONTRAST CT MAXILLOFACIAL WITHOUT CONTRAST CT CERVICAL SPINE WITHOUT CONTRAST TECHNIQUE: Multidetector CT imaging of the head, cervical spine, and maxillofacial structures were performed using the standard protocol without intravenous contrast. Multiplanar CT image reconstructions of the cervical spine and maxillofacial structures were also generated. RADIATION DOSE REDUCTION: This exam was performed according to the departmental dose-optimization  program which includes automated exposure control, adjustment of the mA and/or kV according to patient size and/or use of  iterative reconstruction technique. COMPARISON:  None Available. FINDINGS: CT HEAD FINDINGS Brain: No evidence of acute infarction, hemorrhage, hydrocephalus, extra-axial collection or mass lesion/mass effect. Vascular: No hyperdense vessel or unexpected calcification. Skull: Normal. Negative for fracture or focal lesion. Other: None. CT MAXILLOFACIAL FINDINGS Osseous: No fracture or mandibular dislocation. No destructive process. Orbits: Negative. No traumatic or inflammatory finding. Sinuses: Clear. Soft tissues: There is a soft tissue hematoma along the right frontal scalp. CT CERVICAL SPINE FINDINGS Alignment: Grade 1 anterolisthesis of C6 on C7. Skull base and vertebrae: Along the anterior aspect of the C7 superior endplate there is a poorly corticated osteophyte (series 7, image 33). Soft tissues and spinal canal: No prevertebral fluid or swelling. No visible canal hematoma. Disc levels: No evidence of high-grade spinal canal stenosis. Multilevel degenerative changes with severe neural foraminal stenosis C4-C5. Upper chest: Negative. Other: None IMPRESSION: CT HEAD: No acute intracranial abnormality. CT MAXILLOFACIAL: 1. No acute facial bone fracture. 2. Soft tissue hematoma along the right frontal scalp. CT CERVICAL SPINE: Along the anterior aspect of the C7 superior endplate there is a poorly corticated osteophyte, which is favored to be chronic, but in the setting of trauma an acute osteophyte fracture is not excluded. Correlate with point tenderness and consider further evaluation with a cervical spine MRI. Electronically Signed   By: Marin Roberts M.D.   On: 10/19/2022 14:27   DG Shoulder 1 View Right  Result Date: 10/19/2022 CLINICAL DATA:  Fall.  Pain. EXAM: RIGHT SHOULDER - 1 VIEW COMPARISON:  None Available. FINDINGS: Mild glenohumeral joint space narrowing and inferior  degenerative osteophytes. Mild acromioclavicular joint space narrowing and peripheral osteophytosis. Likely old healed fracture of the lateral clavicle shaft. Mild-to-moderate lateral downsloping the acromion. No definitive acute fracture is seen on limited single frontal view. IMPRESSION: 1. Mild glenohumeral and acromioclavicular osteoarthritis. 2. Mild-to-moderate lateral downsloping of the acromion. 3. No definite acute fracture on limited single frontal view. Electronically Signed   By: Yvonne Kendall M.D.   On: 10/19/2022 14:23   DG Shoulder 1 View Left  Result Date: 10/19/2022 CLINICAL DATA:  Fall.  Pain. EXAM: LEFT SHOULDER COMPARISON:  MRI left shoulder 08/15/2014 FINDINGS: Single frontal view of the left shoulder. The humeral head is mildly elevated consistent with the large full-thickness superior rotator cuff tear seen previously. Mild glenohumeral joint space narrowing and peripheral osteophytosis. Moderate lateral downsloping of the acromion. On limited single frontal view, no definitive acute fracture is visualized. IMPRESSION: 1. Limited single frontal view of the left shoulder demonstrates no definitive acute fracture. 2. Mild glenohumeral osteoarthritis. Electronically Signed   By: Yvonne Kendall M.D.   On: 10/19/2022 14:22   DG Wrist Complete Right  Result Date: 10/19/2022 CLINICAL DATA:  Fall.  Pain. EXAM: RIGHT WRIST - COMPLETE 3+ VIEW COMPARISON:  None Available. FINDINGS: Mildly decreased bone mineralization. Neutral ulnar variance. Mild triscaphe and thumb carpometacarpal joint space narrowing and peripheral osteophytosis. Mild distal radial styloid subcortical sclerosis and cystic change. No acute fracture is seen. No dislocation. IMPRESSION: 1. No acute fracture. 2. Mild triscaphe and thumb carpometacarpal osteoarthritis. Electronically Signed   By: Yvonne Kendall M.D.   On: 10/19/2022 14:19   DG Wrist Complete Left  Result Date: 10/19/2022 CLINICAL DATA:  Fall.  Pain. EXAM: LEFT  WRIST - COMPLETE 3+ VIEW COMPARISON:  None Available. FINDINGS: Mildly decreased bone mineralization. Neutral ulnar variance. Mild-to-moderate thumb carpometacarpal joint space narrowing with mild peripheral degenerative osteophytes. Tiny 1 mm mineralized density just dorsal to the distal radius  on lateral view without a donor site identified. No acute fracture line is seen. No dislocation. IMPRESSION: Tiny 1 mm mineralized density just dorsal to the distal radius on lateral view without a donor site identified. Please correlate for point tenderness in this region. Otherwise, no acute fracture is visualized. Electronically Signed   By: Yvonne Kendall M.D.   On: 10/19/2022 14:17    Procedures Procedures    Medications Ordered in ED Medications - No data to display  ED Course/ Medical Decision Making/ A&P                           Medical Decision Making 77 year old male who presents with concern for fall on anticoagulation,.   HTN on intake, VS otherwise normal. Cardiopulmonary exam is benign abdominal exam as benign. MSK and skin exams as above.   Amount and/or Complexity of Data Reviewed Labs:     Details: CBC without leukocytosis or anemia, CMP with creatinine of 1.5 near patient's baseline.  INR is normal.   Radiology:     Details: CT head without acute intracranial abnormality.  CT maxillofacial without acute facial fracture, soft tissue hematoma on the right frontal scalp.  C-spine with question of poorly corticated osteophyte at the C7 superior endplate, fair to be chronic, question acute fracture.  Patient without any midline tenderness palpation or neck pain pain with range of motion of the neck.  Clinical concern for acute osteophyte fracture from patient's trauma today is low.   Risk OTC drugs.   Wounds irrigated and dressed with bacitracin by this provider.  No further workup warranted in the ER at this time.  Clinical concern for emergent underlying injury that would warrant  further ED workup or inpatient management is exceedingly low  Rich voiced understanding of his medical evaluation and treatment plan. Each of their questions answered to their expressed satisfaction.  Return precautions were given.  Patient is well-appearing, stable, and was discharged in good condition.  This chart was dictated using voice recognition software, Dragon. Despite the best efforts of this provider to proofread and correct errors, errors may still occur which can change documentation meaning. Final Clinical Impression(s) / ED Diagnoses Final diagnoses:  None    Rx / DC Orders ED Discharge Orders     None         Aura Dials 10/20/22 0451    Quintella Reichert, MD 10/21/22 [phone]

## 2022-10-20 NOTE — Discharge Instructions (Signed)
You are seen in the ER today after your fall.  Fortunately did not have any broken bones in your arms, no bleeding in your brain.  Your physical exam was reassuring.  You may apply ice to the swelling in your right face, you may apply antibiotic ointment and clean dressings to the skin tears on your scalp and on your left hand.  Follow-up with your primary care doctor and return to the ER with any new severe symptoms.

## 2022-10-25 ENCOUNTER — Telehealth: Payer: Self-pay

## 2022-10-25 NOTE — Telephone Encounter (Signed)
     Patient  visit on 10/20/2022  at [facility] was for Fall, initial encounter.  Have you been able to follow up with your primary care physician? No. Patient stated he is feeling better.  The patient was or was not able to obtain any needed medicine or equipment. Patient obtained medication.  Are there diet recommendations that you are having difficulty following? No  Patient expresses understanding of discharge instructions and education provided has no other needs at this time.    [facility] Resource Care Guide   [email or web address]  ?? [phone]   [email or web address]: [email or web address]  [email or web address]

## 2022-11-01 ENCOUNTER — Other Ambulatory Visit: Payer: Self-pay

## 2022-11-01 ENCOUNTER — Emergency Department: Payer: PPO

## 2022-11-01 ENCOUNTER — Emergency Department
Admission: EM | Admit: 2022-11-01 | Discharge: 2022-11-01 | Disposition: A | Discharge status: Home / Self Care | Payer: PPO | Attending: Student in an Organized Health Care Education/Training Program | Admitting: Student in an Organized Health Care Education/Training Program

## 2022-11-01 DIAGNOSIS — S20211A Contusion of right front wall of thorax, initial encounter: Secondary | ICD-10-CM | POA: Diagnosis not present

## 2022-11-01 DIAGNOSIS — S4991XA Unspecified injury of right shoulder and upper arm, initial encounter: Secondary | ICD-10-CM | POA: Insufficient documentation

## 2022-11-01 DIAGNOSIS — W108XXA Fall (on) (from) other stairs and steps, initial encounter: Secondary | ICD-10-CM | POA: Diagnosis not present

## 2022-11-01 DIAGNOSIS — W19XXXA Unspecified fall, initial encounter: Secondary | ICD-10-CM

## 2022-11-01 DIAGNOSIS — Z043 Encounter for examination and observation following other accident: Secondary | ICD-10-CM | POA: Diagnosis not present

## 2022-11-01 DIAGNOSIS — S299XXA Unspecified injury of thorax, initial encounter: Secondary | ICD-10-CM | POA: Diagnosis present

## 2022-11-01 DIAGNOSIS — J9811 Atelectasis: Secondary | ICD-10-CM | POA: Diagnosis not present

## 2022-11-01 MED ORDER — OXYCODONE-ACETAMINOPHEN 5-325 MG PO TABS: 1.0000 | ORAL_TABLET | Freq: Four times a day (QID) | ORAL | 0 refills | Status: DC | PRN

## 2022-11-01 MED ORDER — MELOXICAM 7.5 MG PO TABS: 7.5000 mg | ORAL_TABLET | Freq: Every day | ORAL | 0 refills | Status: AC

## 2022-11-01 MED ORDER — MELOXICAM 7.5 MG PO TABS: 7.5000 mg | ORAL_TABLET | Freq: Every day | ORAL | 0 refills | Status: DC

## 2022-11-01 MED ORDER — OXYCODONE-ACETAMINOPHEN 5-325 MG PO TABS
1.0000 | ORAL_TABLET | Freq: Once | ORAL | Status: AC
  Administered 2022-11-01: 1 via ORAL
  Filled 2022-11-01: qty 1

## 2022-11-01 MED ORDER — MELOXICAM 7.5 MG PO TABS
7.5000 mg | ORAL_TABLET | Freq: Once | ORAL | Status: AC
  Administered 2022-11-01: 7.5 mg via ORAL
  Filled 2022-11-01: qty 1

## 2022-11-01 NOTE — ED Provider Notes (Signed)
[facility] Provider Note  Patient Contact: 7:04 PM (approximate)   History   Fall   HPI  Jeremiah Zhang is a 77 y.o. male who presents the emergency department complaining of right shoulder and right rib pain.  Patient had a mechanical fall coming down stairs into his garage.  He landed on his right shoulder.  Did not hit his head or lose consciousness.  Patient has limited range of motion to his right shoulder at this time.  Most of the pain occurs along the superior aspect and anterior aspect of the shoulder.  No other injury or complaint.     Physical Exam   Triage Vital Signs: ED Triage Vitals  Enc Vitals Group     BP 11/01/22 1713 (!) 133/99     Pulse Rate 11/01/22 1713 95     Resp 11/01/22 1713 16     Temp 11/01/22 1713 98.2 F (36.8 C)     Temp Source 11/01/22 1713 Oral     SpO2 11/01/22 1713 93 %     Weight 11/01/22 1711 229 lb 0.9 oz (103.9 kg)     Height 11/01/22 1711 '5\' 8"'$  (1.727 m)     Head Circumference --      Peak Flow --      Pain Score 11/01/22 1711 7     Pain Loc --      Pain Edu? --      Excl. in [place]? --     Most recent vital signs: Vitals:   11/01/22 1713  BP: (!) 133/99  Pulse: 95  Resp: 16  Temp: 98.2 F (36.8 C)  SpO2: 93%     General: Alert and in no acute distress. Head: No new acute traumatic findings  Neck: No stridor. No cervical spine tenderness to palpation.  Cardiovascular:  Good peripheral perfusion Respiratory: Normal respiratory effort without tachypnea or retractions. Lungs CTAB. Good air entry to the bases with no decreased or absent breath sounds Musculoskeletal: Limited range of motion to the shoulder at this time.  Patient has limits in all fields of motion.  He is unable to extend, flex, significantly abduct or adduct his shoulder.  Tender along the humeral head into the [facility] joint.  Tenderness extends into the superior shoulder over the rotator cuff.  No palpable abnormality.  No areas of  ecchymosis.  No gross edema.  No deformity.  Examination of the ribs reveals no visible signs of trauma.  No ecchymosis.  No palpable abnormality or crepitus.  Good underlying breath sounds bilaterally. Neurologic:  No gross focal neurologic deficits are appreciated.  Skin:   No rash noted Other:   ED Results / Procedures / Treatments   Labs (all labs ordered are listed, but only abnormal results are displayed) Labs Reviewed - No data to display   EKG     RADIOLOGY  I personally viewed, evaluated, and interpreted these images as part of my medical decision making, as well as reviewing the written report by the radiologist.  ED Provider Interpretation: Visualization of imaging of the shoulder, humerus, chest reveals no acute traumatic findings to include fracture or dislocation.  DG Chest 2 View  Result Date: 11/01/2022 CLINICAL DATA:  Fall EXAM: CHEST - 2 VIEW COMPARISON:  09/17/2016 FINDINGS: Linear bibasilar opacities, likely atelectasis. Heart is normal size. Tortuous, calcified aorta. No effusions or pneumothorax. No acute bony abnormality. IMPRESSION: Bibasilar atelectasis. Electronically Signed   By: Rolm Baptise M.D.   On: 11/01/2022 17:59  DG Shoulder Right  Result Date: 11/01/2022 CLINICAL DATA:  Fall EXAM: RIGHT SHOULDER - 2+ VIEW COMPARISON:  None Available. FINDINGS: There is no evidence of fracture or dislocation. There is no evidence of arthropathy or other focal bone abnormality. Soft tissues are unremarkable. IMPRESSION: Negative. Electronically Signed   By: Rolm Baptise M.D.   On: 11/01/2022 17:58   DG Humerus Right  Result Date: 11/01/2022 CLINICAL DATA:  Fall EXAM: RIGHT HUMERUS - 2+ VIEW COMPARISON:  None Available. FINDINGS: There is no evidence of fracture or other focal bone lesions. Soft tissues are unremarkable. IMPRESSION: Negative. Electronically Signed   By: Rolm Baptise M.D.   On: 11/01/2022 17:58    PROCEDURES:  Critical Care performed:  No  Procedures   MEDICATIONS ORDERED IN ED: Medications  oxyCODONE-acetaminophen (PERCOCET/ROXICET) 5-325 MG per tablet 1 tablet (has no administration in time range)  meloxicam (MOBIC) tablet 7.5 mg (has no administration in time range)     IMPRESSION / MDM / ASSESSMENT AND PLAN / ED COURSE  I reviewed the triage vital signs and the nursing notes.                                 Differential diagnosis includes, but is not limited to, pulm, shoulder fracture, shoulder dislocation, AC separation, rotator cuff tear, rib fracture, pneumothorax  Patient's presentation is most consistent with acute presentation with potential threat to life or bodily function.   Patient's diagnosis is consistent with fall, shoulder injury, rib contusion.  Patient presents emergency department complaining of musculoskeletal pain after mechanical fall.  Patient landed on his right shoulder.  He has significantly reduced range of motion at this time.  Unable to complete special testing of the shoulder. At this time given the significant reduction of range of motion to the shoulder, concern for rotator cuff tear exist.  This time however we will treat symptomatically with sling, anti-inflammatory and pain medication.  Follow-up with orthopedics.  Patient is currently [facility] clinic orthopedic patient and will follow-up with same.  Return precautions discussed with patient and wife..  Patient is given ED precautions to return to the ED for any worsening or new symptoms.     FINAL CLINICAL IMPRESSION(S) / ED DIAGNOSES   Final diagnoses:  Fall, initial encounter  Injury of right shoulder, initial encounter  Contusion of rib on right side, initial encounter     Rx / DC Orders   ED Discharge Orders          Ordered    meloxicam (MOBIC) 7.5 MG tablet  Daily,   Status:  Discontinued        11/01/22 1958    oxyCODONE-acetaminophen (PERCOCET/ROXICET) 5-325 MG tablet  Every 6 hours PRN,   Status:   Discontinued        11/01/22 1958    meloxicam (MOBIC) 7.5 MG tablet  Daily        11/01/22 1959    oxyCODONE-acetaminophen (PERCOCET/ROXICET) 5-325 MG tablet  Every 6 hours PRN        11/01/22 1959             Note:  This document was prepared using Dragon voice recognition software and may include unintentional dictation errors.   Brynda Peon 11/01/22 1959    Merlyn Lot, MD 11/01/22 2157

## 2022-11-01 NOTE — ED Triage Notes (Signed)
Pt presents to the ED via POV due to fall. Pt states he fell onto his arm. Denies LOC or hitting head. Pt on blood thinners. Pt A&Ox4

## 2022-11-03 ENCOUNTER — Other Ambulatory Visit (HOSPITAL_COMMUNITY): Payer: Self-pay | Admitting: Orthopedic Surgery

## 2022-11-03 ENCOUNTER — Ambulatory Visit
Admission: RE | Admit: 2022-11-03 | Discharge: 2022-11-03 | Disposition: A | Discharge status: Home / Self Care | Payer: PPO | Source: Ambulatory Visit | Attending: Orthopedic Surgery | Admitting: Orthopedic Surgery

## 2022-11-03 DIAGNOSIS — R29898 Other symptoms and signs involving the musculoskeletal system: Secondary | ICD-10-CM | POA: Diagnosis not present

## 2022-11-03 DIAGNOSIS — S46011A Strain of muscle(s) and tendon(s) of the rotator cuff of right shoulder, initial encounter: Secondary | ICD-10-CM

## 2022-11-05 ENCOUNTER — Other Ambulatory Visit: Payer: Self-pay

## 2022-11-05 DIAGNOSIS — E291 Testicular hypofunction: Secondary | ICD-10-CM

## 2022-11-05 DIAGNOSIS — Z8546 Personal history of malignant neoplasm of prostate: Secondary | ICD-10-CM

## 2022-11-08 ENCOUNTER — Other Ambulatory Visit: Payer: PPO

## 2022-11-08 DIAGNOSIS — Z8546 Personal history of malignant neoplasm of prostate: Secondary | ICD-10-CM | POA: Diagnosis not present

## 2022-11-08 DIAGNOSIS — M19111 Post-traumatic osteoarthritis, right shoulder: Secondary | ICD-10-CM | POA: Diagnosis not present

## 2022-11-08 DIAGNOSIS — E291 Testicular hypofunction: Secondary | ICD-10-CM

## 2022-11-09 ENCOUNTER — Ambulatory Visit: Payer: HMO | Admitting: Podiatry

## 2022-11-09 DIAGNOSIS — H6123 Impacted cerumen, bilateral: Secondary | ICD-10-CM | POA: Diagnosis not present

## 2022-11-09 DIAGNOSIS — H903 Sensorineural hearing loss, bilateral: Secondary | ICD-10-CM | POA: Diagnosis not present

## 2022-11-09 LAB — HEMATOCRIT: Hematocrit: 39.2 % (ref 37.5–51.0)

## 2022-11-09 LAB — TESTOSTERONE: Testosterone: 55 ng/dL — ABNORMAL LOW (ref 264–916)

## 2022-11-09 LAB — PSA: Prostate Specific Ag, Serum: <0.1 ng/mL (ref 0.0–4.0)

## 2022-11-12 ENCOUNTER — Ambulatory Visit: Payer: Medicare PPO | Admitting: Urology

## 2022-11-29 ENCOUNTER — Ambulatory Visit: Payer: HMO | Admitting: Podiatry

## 2022-11-29 DIAGNOSIS — E1165 Type 2 diabetes mellitus with hyperglycemia: Secondary | ICD-10-CM | POA: Diagnosis not present

## 2022-11-29 DIAGNOSIS — Z79899 Other long term (current) drug therapy: Secondary | ICD-10-CM | POA: Diagnosis not present

## 2022-11-29 DIAGNOSIS — E291 Testicular hypofunction: Secondary | ICD-10-CM | POA: Diagnosis not present

## 2022-12-02 ENCOUNTER — Ambulatory Visit: Payer: PPO | Admitting: Urology

## 2022-12-02 ENCOUNTER — Encounter: Payer: Self-pay | Admitting: Urology

## 2022-12-02 VITALS — BP 125/80 | HR 70 | Ht 69.0 in | Wt 223.0 lb

## 2022-12-02 DIAGNOSIS — E291 Testicular hypofunction: Secondary | ICD-10-CM

## 2022-12-02 DIAGNOSIS — Z8546 Personal history of malignant neoplasm of prostate: Secondary | ICD-10-CM | POA: Diagnosis not present

## 2022-12-02 NOTE — Progress Notes (Signed)
12/02/2022 8:53 PM   Jeremiah Zhang 1946-03-16 [id number]  Referring provider: [name], MD [phone] [address] [facility] [place],  [place] [address]  Chief Complaint  Patient presents with   Hypogonadism    Urologic history: 1.  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  Hypogonadism -Started TRT July 2019   3.  Erectile dysfunction  HPI: 77 y.o. male presents for annual follow-up.  Labs 11/08/2022: Testosterone level 55 ng/dL; PSA <0.1; HCT 39.3 He complained of muscle pain in his deltoid region that he felt was secondary to testosterone gel and has been off the medication for at least 2 months He has had 2 falls sustaining upper extremity injuries and a concussion Has an appointment scheduled interim this month for evaluation of hyperbaric oxygen therapy No bothersome LUTS No dysuria gross hematuria No flank, abdominal or pelvic pain   PMH: Past Medical History:  Diagnosis Date   Actinic keratosis    Anticoagulant long-term use    ELIQUIS   Arthritis    Basal cell carcinoma 01/27/2010   Right spinal upper back. Superficial.    Basal cell carcinoma 11/15/2019   Left inferior antihelix post. to tragus. Superficial and nodular patterns. Excised: 11/27/2019   Basal cell carcinoma 11/15/2019   Left mid lateral nose. Nodular and infiltrative patterns. Excised: 11/27/2019   Basal cell carcinoma 11/27/2019   Left mid superior helix. BCC with focal sclerosis.   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 Slight atypia. Edges free.   ED (erectile  dysfunction)    GERD (gastroesophageal reflux disease)    Hiatal hernia    History of DVT of lower extremity    POST OP KNEE SURGERY   History of transient ischemic attack (TIA)    PER PT HX  ?POSSIBLE-- PT ON ELIQUIS   Hyperlipidemia    Hypertension    Hypogonadism male    OSA (obstructive sleep apnea)    per pt positive sleep study yrs ago recommended CPAP --  pt Non- compliant -- states uses Breath Rite Nasal Strips   Peripheral neuropathy    Prostate cancer [facility]) urologist-  dr [name]  oncologist-- dr [name]   dx 03-25-2015  T1c, Gleason 4+3  s/p  cryoablation prostate 06-02-2015/  external  radiation therapy 08-16-2016 to 09-20-2016/  scheduled for radioactive seed implants 10-22-2016   TIA (transient ischemic attack)    ?? 08/20/2014? Eliquis 07/02/15   Type 2 diabetes mellitus ([place])    followed by dr [name] [facility])  last A1c 8.4 on 07-27-2016 per lov note   Umbilical hernia    Wears glasses    Wears hearing aid    bilateral    Surgical History: Past Surgical History:  Procedure Laterality Date   COLONOSCOPY WITH PROPOFOL N/A 05/30/2017   Procedure: COLONOSCOPY WITH PROPOFOL;  Surgeon: [name], MD;  Location: [facility] ENDOSCOPY;  Service: Endoscopy;  Laterality: N/A;   COLONOSCOPY WITH PROPOFOL N/A 12/28/2021   Procedure: COLONOSCOPY WITH PROPOFOL;  Surgeon: Annamaria Helling, DO;  Location[facility] ENDOSCOPY;  Service: Gastroenterology;  Laterality: N/A;   CRYOABLATION  N/A 06/02/2015   Procedure: CRYO ABLATION PROSTATE;  Surgeon: Carolan Clines, MD;  Location: [facility];  Service: Urology;  Laterality: N/A;   CYSTOSCOPY WITH URETHRAL DILATATION N/A 05/04/2019   Procedure: CYSTOSCOPY WITH URETHRAL DILATATION;  Surgeon: [name], MD;  Location[facility];  Service: Urology;  Laterality: N/A;   ELBOW BURSA SURGERY Left Feb 1997   prostate biopsies     x12, cryotherapy   RADIOACTIVE SEED IMPLANT N/A  10/22/2016   Procedure: RADIOACTIVE SEED IMPLANT/BRACHYTHERAPY IMPLANT, URETHRAL STRICTURE DIALIATION;  Surgeon: Carolan Clines, MD;  Location: [facility];  Service: Urology;  Laterality: N/A;   SHOULDER OPEN ROTATOR CUFF REPAIR Left 09-04-2014   skin cancer L ear     Dr [name]    TOTAL KNEE ARTHROPLASTY Bilateral right 08-13-2008  &  left 0000000   UMBILICAL HERNIA REPAIR  06-23-2012   ureter stretch  05/02/2019   [facility] Dr [name]    Home Medications:  Allergies as of 12/02/2022       Reactions   Bee Venom Anaphylaxis   Crestor [rosuvastatin] Other (See Comments)   "muscle weakness and discomfort"   Vytorin [ezetimibe-simvastatin] Other (See Comments)   "muscle weakness and discomfort"   Dynapen [dicloxacillin] Rash        Medication List        Accurate as of December 02, 2022  8:53 PM. If you have any questions, ask your nurse or doctor.          Accu-Chek FastClix Lancets Misc   ADVIL PO Take 2 tablets by mouth as needed.   amLODipine 5 MG tablet Commonly known as: NORVASC TAKE 1 TABLET(5 MG) BY MOUTH EVERY DAY   amLODipine 5 MG tablet Commonly known as: NORVASC Take by mouth.   apixaban 5 MG Tabs tablet Commonly known as: ELIQUIS TAKE 1 TABLET(5 MG) BY MOUTH TWICE DAILY   ascorbic acid 500 MG tablet Commonly known as: VITAMIN C Take 1,000 mg by mouth daily.   bisoprolol-hydrochlorothiazide 10-6.25 MG tablet Commonly known as: ZIAC TAKE 1 TABLET BY MOUTH EVERY DAY   EPINEPHrine 0.3 mg/0.3 mL Soaj injection Commonly known as: EPI-PEN Inject into the muscle as needed.   EPINEPHrine 0.3 mg/0.3 mL Soaj injection Commonly known as: EPI-PEN INJECT 0.3 MG INTO THE MUSCLE ONCE AS NEEDED FOR ANAPHYLAXIS   esomeprazole 40 MG capsule Commonly known as: NEXIUM Take 40 mg by mouth every evening.   EXCEDRIN PO Take 2-3 tablets by mouth. Rarely as needed for headache. Once in 3 mo max. +2 more in 1/2 if a severe headache.    Flowflex COVID-19 Ag Home Test Kit Generic drug: COVID-19 At Home Antigen Test TEST AS DIRECTED TODAY   fluconazole 150 MG tablet Commonly known as: DIFLUCAN Take one tablet by mouth every 3 days.   fluticasone 50 MCG/ACT nasal spray Commonly known as: FLONASE Place into both nostrils.   furosemide 40 MG tablet Commonly known as: LASIX Take 40 mg by mouth daily.   glimepiride 2 MG tablet Commonly known as: AMARYL TAKE 1 TABLET BY MOUTH TWICE DAILY   glucose blood test strip TEST TWICE DAILY AS INSTRUCTED   meloxicam 7.5 MG tablet Commonly known as: Mobic Take 1 tablet (7.5 mg total) by mouth daily.   metFORMIN 1000 MG tablet Commonly known as: GLUCOPHAGE Take by mouth.   metFORMIN 1000 MG tablet Commonly known as: GLUCOPHAGE TAKE 1 TABLET(1000 MG) BY MOUTH TWICE DAILY WITH MEALS   Na Sulfate-K Sulfate-Mg  Sulf 17.5-3.13-1.6 GM/177ML Soln See admin instructions.   NovoLOG FlexPen 100 UNIT/ML FlexPen Generic drug: insulin aspart SMARTSIG:10 Unit(s) SUB-Q 3 Times Daily   oxyCODONE-acetaminophen 5-325 MG tablet Commonly known as: PERCOCET/ROXICET Take 1 tablet by mouth every 6 (six) hours as needed for severe pain.   quinapril 20 MG tablet Commonly known as: ACCUPRIL TAKE 1 TABLET BY MOUTH TWICE DAILY   RED YEAST RICE PO Take by mouth 2 (two) times daily.   sitaGLIPtin 100 MG tablet Commonly known as: JANUVIA Take by mouth.   Testosterone 20.25 MG/ACT (1.62%) Gel Apply 1 pump to 1 shoulder and 2 pumps to the other shoulder daily (3 pumps total)   tiZANidine 2 MG tablet Commonly known as: ZANAFLEX Take 2 mg by mouth 3 (three) times daily.   TYLENOL PO Take 2-3 tablets by mouth as needed.   Vitamin D3 50 MCG (2000 UT) Tabs Generic drug: Cholecalciferol Take 1 capsule by mouth daily.        Allergies:  Allergies  Allergen Reactions   Bee Venom Anaphylaxis   Crestor [Rosuvastatin] Other (See Comments)    "muscle weakness and discomfort"    Vytorin [Ezetimibe-Simvastatin] Other (See Comments)    "muscle weakness and discomfort"   Dynapen [Dicloxacillin] Rash    Family History: Family History  Problem Relation Age of Onset   Cancer Mother 37       colon/resection    Social History:  reports that he has never smoked. He has never used smokeless tobacco. He reports that he does not drink alcohol and does not use drugs.   Physical Exam: BP 125/80   Pulse 70   Ht 5' 9"$  (1.753 m)   Wt 223 lb (101.2 kg)   BMI 32.93 kg/m   Constitutional:  Alert and oriented, No acute distress. HEENT: [place] AT Respiratory: Normal respiratory effort, no increased work of breathing. Psychiatric: Normal mood and affect.   Assessment & Plan:    1.  Hypogonadism Testosterone level was near castrate.  He discontinued testosterone gel due to muscle pain at the application site which would be an uncommon side effect Recommended he try applying on the thigh region or behind the knees to see if this is better tolerated 6 week lab visit for repeat testosterone level  2.  History of prostate cancer PSA remains undetectable   Abbie Sons, MD  [facility] [address] [place], [place] [address] [phone]

## 2022-12-06 DIAGNOSIS — E1122 Type 2 diabetes mellitus with diabetic chronic kidney disease: Secondary | ICD-10-CM | POA: Diagnosis not present

## 2022-12-06 DIAGNOSIS — Z79899 Other long term (current) drug therapy: Secondary | ICD-10-CM | POA: Diagnosis not present

## 2022-12-06 DIAGNOSIS — N1831 Chronic kidney disease, stage 3a: Secondary | ICD-10-CM | POA: Diagnosis not present

## 2022-12-06 DIAGNOSIS — E291 Testicular hypofunction: Secondary | ICD-10-CM | POA: Diagnosis not present

## 2022-12-06 DIAGNOSIS — E1142 Type 2 diabetes mellitus with diabetic polyneuropathy: Secondary | ICD-10-CM | POA: Diagnosis not present

## 2022-12-06 DIAGNOSIS — E1165 Type 2 diabetes mellitus with hyperglycemia: Secondary | ICD-10-CM | POA: Diagnosis not present

## 2022-12-06 DIAGNOSIS — C61 Malignant neoplasm of prostate: Secondary | ICD-10-CM | POA: Diagnosis not present

## 2022-12-06 DIAGNOSIS — F321 Major depressive disorder, single episode, moderate: Secondary | ICD-10-CM | POA: Diagnosis not present

## 2022-12-10 ENCOUNTER — Ambulatory Visit: Payer: HMO | Admitting: Podiatry

## 2022-12-15 ENCOUNTER — Ambulatory Visit: Payer: PPO | Admitting: Dermatology

## 2022-12-15 VITALS — BP 118/77

## 2022-12-15 DIAGNOSIS — L820 Inflamed seborrheic keratosis: Secondary | ICD-10-CM

## 2022-12-15 DIAGNOSIS — L821 Other seborrheic keratosis: Secondary | ICD-10-CM

## 2022-12-15 DIAGNOSIS — L570 Actinic keratosis: Secondary | ICD-10-CM

## 2022-12-15 DIAGNOSIS — L578 Other skin changes due to chronic exposure to nonionizing radiation: Secondary | ICD-10-CM | POA: Diagnosis not present

## 2022-12-15 NOTE — Progress Notes (Signed)
   Follow-Up Visit   Subjective  Jeremiah Zhang is a 77 y.o. male who presents for the following: Other (Spots of face, scalp and ears that are scaly). The patient has spots, moles and lesions to be evaluated, some may be new or changing and the patient has concerns that these could be cancer.  The following portions of the chart were reviewed this encounter and updated as appropriate:   Tobacco  Allergies  Meds  Problems  Med Hx  Surg Hx  Fam Hx     Review of Systems:  No other skin or systemic complaints except as noted in HPI or Assessment and Plan.  Objective  Well appearing patient in no apparent distress; mood and affect are within normal limits.  A focused examination was performed including scalp, face. Relevant physical exam findings are noted in the Assessment and Plan.  Scalp Erythematous stuck-on, waxy papule or plaque  Right nasal bridge x 1, left earlobe x 1 (2) Erythematous thin papules/macules with gritty scale.    Assessment & Plan   Actinic Damage - chronic, secondary to cumulative UV radiation exposure/sun exposure over time - diffuse scaly erythematous macules with underlying dyspigmentation - Recommend daily broad spectrum sunscreen SPF 30+ to sun-exposed areas, reapply every 2 hours as needed.  - Recommend staying in the shade or wearing long sleeves, sun glasses (UVA+UVB protection) and wide brim hats (4-inch brim around the entire circumference of the hat). - Call for new or changing lesions.  Seborrheic Keratoses - Stuck-on, waxy, tan-brown papules and/or plaques  - Benign-appearing - Discussed benign etiology and prognosis. - Observe - Call for any changes  Inflamed seborrheic keratosis Scalp  Destruction of lesion - Scalp Complexity: simple   Destruction method: cryotherapy   Informed consent: discussed and consent obtained   Timeout:  patient name, date of birth, surgical site, and procedure verified Lesion destroyed using liquid  nitrogen: Yes   Region frozen until ice ball extended beyond lesion: Yes   Outcome: patient tolerated procedure well with no complications   Post-procedure details: wound care instructions given    AK (actinic keratosis) (2) Right nasal bridge x 1, left earlobe x 1  Destruction of lesion - Right nasal bridge x 1, left earlobe x 1 Complexity: simple   Destruction method: cryotherapy   Informed consent: discussed and consent obtained   Timeout:  patient name, date of birth, surgical site, and procedure verified Lesion destroyed using liquid nitrogen: Yes   Region frozen until ice ball extended beyond lesion: Yes   Outcome: patient tolerated procedure well with no complications   Post-procedure details: wound care instructions given     Return for Follow up as scheduled, [facility].  I, Ashok Cordia, CMA, am acting as scribe for [name], MD . Documentation: I have reviewed the above documentation for accuracy and completeness, and I agree with the above.  [name], MD

## 2022-12-15 NOTE — Patient Instructions (Signed)
 Cryotherapy Aftercare  Wash gently with soap and water everyday.   Apply Vaseline and Band-Aid daily until healed. Due to recent changes in healthcare laws, you may see results of your pathology and/or laboratory studies on MyChart before the doctors have had a chance to review them. We understand that in some cases there may be results that are confusing or concerning to you. Please understand that not all results are received at the same time and often the doctors may need to interpret multiple results in order to provide you with the best plan of care or course of treatment. Therefore, we ask that you please give us 2 business days to thoroughly review all your results before contacting the office for clarification. Should we see a critical lab result, you will be contacted sooner.   If You Need Anything After Your Visit  If you have any questions or concerns for your doctor, please call our main line at [phone] and press option 4 to reach your doctor's medical assistant. If no one answers, please leave a voicemail as directed and we will return your call as soon as possible. Messages left after 4 pm will be answered the following business day.   You may also send us a message via MyChart. We typically respond to MyChart messages within 1-2 business days.  For prescription refills, please ask your pharmacy to contact our office. Our fax number is [phone].  If you have an urgent issue when the clinic is closed that cannot wait until the next business day, you can page your doctor at the number below.    Please note that while we do our best to be available for urgent issues outside of office hours, we are not available 24/7.   If you have an urgent issue and are unable to reach us, you may choose to seek medical care at your doctor's office, retail clinic, urgent care center, or emergency room.  If you have a medical emergency, please immediately call 911 or go to the emergency  department.  Pager Numbers  - Dr. Kowalski: [phone]  - Dr. Moye: [phone]  - Dr. Stewart: [phone]  In the event of inclement weather, please call our main line at [phone] for an update on the status of any delays or closures.  Dermatology Medication Tips: Please keep the boxes that topical medications come in in order to help keep track of the instructions about where and how to use these. Pharmacies typically print the medication instructions only on the boxes and not directly on the medication tubes.   If your medication is too expensive, please contact our office at [phone] option 4 or send us a message through MyChart.   We are unable to tell what your co-pay for medications will be in advance as this is different depending on your insurance coverage. However, we may be able to find a substitute medication at lower cost or fill out paperwork to get insurance to cover a needed medication.   If a prior authorization is required to get your medication covered by your insurance company, please allow us 1-2 business days to complete this process.  Drug prices often vary depending on where the prescription is filled and some pharmacies may offer cheaper prices.  The website www.goodrx.com contains coupons for medications through different pharmacies. The prices here do not account for what the cost may be with help from insurance (it may be cheaper with your insurance), but the website can give you the   price if you did not use any insurance.  - You can print the associated coupon and take it with your prescription to the pharmacy.  - You may also stop by our office during regular business hours and pick up a GoodRx coupon card.  - If you need your prescription sent electronically to a different pharmacy, notify our office through [facility] MyChart or by phone at [phone] option 4.     Si Usted Necesita Algo Despus de Su Visita  Tambin puede enviarnos un  mensaje a travs de MyChart. Por lo general respondemos a los mensajes de MyChart en el transcurso de 1 a 2 das hbiles.  Para renovar recetas, por favor pida a su farmacia que se ponga en contacto con nuestra oficina. Nuestro nmero de fax es el [phone].  Si tiene un asunto urgente cuando la clnica est cerrada y que no puede esperar hasta el siguiente da hbil, puede llamar/localizar a su doctor(a) al nmero que aparece a continuacin.   Por favor, tenga en cuenta que aunque hacemos todo lo posible para estar disponibles para asuntos urgentes fuera del horario de oficina, no estamos disponibles las 24 horas del da, los 7 das de la semana.   Si tiene un problema urgente y no puede comunicarse con nosotros, puede optar por buscar atencin mdica  en el consultorio de su doctor(a), en una clnica privada, en un centro de atencin urgente o en una sala de emergencias.  Si tiene una emergencia mdica, por favor llame inmediatamente al 911 o vaya a la sala de emergencias.  Nmeros de bper  - Dr. Kowalski: [phone]  - Dra. Moye: [phone]  - Dra. Stewart: [phone]  En caso de inclemencias del tiempo, por favor llame a nuestra lnea principal al [phone] para una actualizacin sobre el estado de cualquier retraso o cierre.  Consejos para la medicacin en dermatologa: Por favor, guarde las cajas en las que vienen los medicamentos de uso tpico para ayudarle a seguir las instrucciones sobre dnde y cmo usarlos. Las farmacias generalmente imprimen las instrucciones del medicamento slo en las cajas y no directamente en los tubos del medicamento.   Si su medicamento es muy caro, por favor, pngase en contacto con nuestra oficina llamando al [phone] y presione la opcin 4 o envenos un mensaje a travs de MyChart.   No podemos decirle cul ser su copago por los medicamentos por adelantado ya que esto es diferente dependiendo de la cobertura de su seguro. Sin embargo,  es posible que podamos encontrar un medicamento sustituto a menor costo o llenar un formulario para que el seguro cubra el medicamento que se considera necesario.   Si se requiere una autorizacin previa para que su compaa de seguros cubra su medicamento, por favor permtanos de 1 a 2 das hbiles para completar este proceso.  Los precios de los medicamentos varan con frecuencia dependiendo del lugar de dnde se surte la receta y alguna farmacias pueden ofrecer precios ms baratos.  El sitio web www.goodrx.com tiene cupones para medicamentos de diferentes farmacias. Los precios aqu no tienen en cuenta lo que podra costar con la ayuda del seguro (puede ser ms barato con su seguro), pero el sitio web puede darle el precio si no utiliz ningn seguro.  - Puede imprimir el cupn correspondiente y llevarlo con su receta a la farmacia.  - Tambin puede pasar por nuestra oficina durante el horario de atencin regular y recoger una tarjeta de cupones de GoodRx.  - Si necesita que   su receta se enve electrnicamente a una farmacia diferente, informe a nuestra oficina a travs de MyChart de [place] o por telfono llamando al [phone] y presione la opcin 4.  

## 2022-12-20 DIAGNOSIS — G4733 Obstructive sleep apnea (adult) (pediatric): Secondary | ICD-10-CM | POA: Diagnosis not present

## 2022-12-22 ENCOUNTER — Encounter: Payer: Self-pay | Admitting: Dermatology

## 2022-12-23 DIAGNOSIS — E785 Hyperlipidemia, unspecified: Secondary | ICD-10-CM | POA: Diagnosis not present

## 2022-12-23 DIAGNOSIS — E1142 Type 2 diabetes mellitus with diabetic polyneuropathy: Secondary | ICD-10-CM | POA: Diagnosis not present

## 2022-12-23 DIAGNOSIS — Z794 Long term (current) use of insulin: Secondary | ICD-10-CM | POA: Diagnosis not present

## 2022-12-23 DIAGNOSIS — N1831 Chronic kidney disease, stage 3a: Secondary | ICD-10-CM | POA: Diagnosis not present

## 2022-12-23 DIAGNOSIS — E1165 Type 2 diabetes mellitus with hyperglycemia: Secondary | ICD-10-CM | POA: Diagnosis not present

## 2022-12-23 DIAGNOSIS — E1122 Type 2 diabetes mellitus with diabetic chronic kidney disease: Secondary | ICD-10-CM | POA: Diagnosis not present

## 2022-12-23 DIAGNOSIS — E1169 Type 2 diabetes mellitus with other specified complication: Secondary | ICD-10-CM | POA: Diagnosis not present

## 2023-01-13 ENCOUNTER — Other Ambulatory Visit: Payer: PPO

## 2023-01-19 ENCOUNTER — Other Ambulatory Visit: Payer: PPO

## 2023-01-19 DIAGNOSIS — E291 Testicular hypofunction: Secondary | ICD-10-CM

## 2023-01-20 ENCOUNTER — Encounter: Payer: Self-pay | Admitting: *Deleted

## 2023-01-20 LAB — TESTOSTERONE: Testosterone: 443 ng/dL (ref 264–916)

## 2023-01-27 ENCOUNTER — Ambulatory Visit: Payer: Self-pay | Admitting: Dermatology

## 2023-03-23 DIAGNOSIS — G4733 Obstructive sleep apnea (adult) (pediatric): Secondary | ICD-10-CM | POA: Diagnosis not present

## 2023-03-24 DIAGNOSIS — Z794 Long term (current) use of insulin: Secondary | ICD-10-CM | POA: Diagnosis not present

## 2023-03-24 DIAGNOSIS — E1122 Type 2 diabetes mellitus with diabetic chronic kidney disease: Secondary | ICD-10-CM | POA: Diagnosis not present

## 2023-03-24 DIAGNOSIS — N1831 Chronic kidney disease, stage 3a: Secondary | ICD-10-CM | POA: Diagnosis not present

## 2023-03-31 DIAGNOSIS — Z794 Long term (current) use of insulin: Secondary | ICD-10-CM | POA: Diagnosis not present

## 2023-03-31 DIAGNOSIS — E1142 Type 2 diabetes mellitus with diabetic polyneuropathy: Secondary | ICD-10-CM | POA: Diagnosis not present

## 2023-03-31 DIAGNOSIS — E1122 Type 2 diabetes mellitus with diabetic chronic kidney disease: Secondary | ICD-10-CM | POA: Diagnosis not present

## 2023-03-31 DIAGNOSIS — I152 Hypertension secondary to endocrine disorders: Secondary | ICD-10-CM | POA: Diagnosis not present

## 2023-03-31 DIAGNOSIS — N1831 Chronic kidney disease, stage 3a: Secondary | ICD-10-CM | POA: Diagnosis not present

## 2023-03-31 DIAGNOSIS — E1159 Type 2 diabetes mellitus with other circulatory complications: Secondary | ICD-10-CM | POA: Diagnosis not present

## 2023-03-31 DIAGNOSIS — E1169 Type 2 diabetes mellitus with other specified complication: Secondary | ICD-10-CM | POA: Diagnosis not present

## 2023-03-31 DIAGNOSIS — E785 Hyperlipidemia, unspecified: Secondary | ICD-10-CM | POA: Diagnosis not present

## 2023-04-06 DIAGNOSIS — Z0189 Encounter for other specified special examinations: Secondary | ICD-10-CM | POA: Diagnosis not present

## 2023-04-06 DIAGNOSIS — M19111 Post-traumatic osteoarthritis, right shoulder: Secondary | ICD-10-CM | POA: Diagnosis not present

## 2023-04-12 DIAGNOSIS — E1122 Type 2 diabetes mellitus with diabetic chronic kidney disease: Secondary | ICD-10-CM | POA: Diagnosis not present

## 2023-04-12 DIAGNOSIS — N1831 Chronic kidney disease, stage 3a: Secondary | ICD-10-CM | POA: Diagnosis not present

## 2023-04-12 DIAGNOSIS — Z79899 Other long term (current) drug therapy: Secondary | ICD-10-CM | POA: Diagnosis not present

## 2023-04-12 DIAGNOSIS — E1165 Type 2 diabetes mellitus with hyperglycemia: Secondary | ICD-10-CM | POA: Diagnosis not present

## 2023-04-14 DIAGNOSIS — Z0189 Encounter for other specified special examinations: Secondary | ICD-10-CM | POA: Diagnosis not present

## 2023-06-30 ENCOUNTER — Ambulatory Visit: Payer: Medicare PPO | Admitting: Dermatology

## 2023-06-30 VITALS — BP 120/77

## 2023-06-30 DIAGNOSIS — D492 Neoplasm of unspecified behavior of bone, soft tissue, and skin: Secondary | ICD-10-CM

## 2023-06-30 DIAGNOSIS — R238 Other skin changes: Secondary | ICD-10-CM

## 2023-06-30 DIAGNOSIS — W098XXA Fall on or from other playground equipment, initial encounter: Secondary | ICD-10-CM | POA: Diagnosis not present

## 2023-06-30 DIAGNOSIS — D1801 Hemangioma of skin and subcutaneous tissue: Secondary | ICD-10-CM

## 2023-06-30 DIAGNOSIS — L814 Other melanin hyperpigmentation: Secondary | ICD-10-CM

## 2023-06-30 DIAGNOSIS — Z86018 Personal history of other benign neoplasm: Secondary | ICD-10-CM

## 2023-06-30 DIAGNOSIS — L820 Inflamed seborrheic keratosis: Secondary | ICD-10-CM

## 2023-06-30 DIAGNOSIS — D225 Melanocytic nevi of trunk: Secondary | ICD-10-CM | POA: Diagnosis not present

## 2023-06-30 DIAGNOSIS — L570 Actinic keratosis: Secondary | ICD-10-CM

## 2023-06-30 DIAGNOSIS — L578 Other skin changes due to chronic exposure to nonionizing radiation: Secondary | ICD-10-CM

## 2023-06-30 DIAGNOSIS — Z872 Personal history of diseases of the skin and subcutaneous tissue: Secondary | ICD-10-CM

## 2023-06-30 DIAGNOSIS — D229 Melanocytic nevi, unspecified: Secondary | ICD-10-CM

## 2023-06-30 DIAGNOSIS — L821 Other seborrheic keratosis: Secondary | ICD-10-CM

## 2023-06-30 DIAGNOSIS — Z1283 Encounter for screening for malignant neoplasm of skin: Secondary | ICD-10-CM | POA: Diagnosis not present

## 2023-06-30 DIAGNOSIS — Z85828 Personal history of other malignant neoplasm of skin: Secondary | ICD-10-CM

## 2023-06-30 NOTE — Progress Notes (Signed)
Follow-Up Visit   Subjective  Jeremiah Zhang is a 77 y.o. male who presents for the following: Skin Cancer Screening and Full Body Skin Exam, hx of BCCs, hx of Aks, scaly spots scalp, check hard bump L ear  The patient presents for Total-Body Skin Exam (TBSE) for skin cancer screening and mole check. The patient has spots, moles and lesions to be evaluated, some may be new or changing and the patient may have concern these could be cancer.    The following portions of the chart were reviewed this encounter and updated as appropriate: medications, allergies, medical history  Review of Systems:  No other skin or systemic complaints except as noted in HPI or Assessment and Plan.  Objective  Well appearing patient in no apparent distress; mood and affect are within normal limits.  A full examination was performed including scalp, head, eyes, ears, nose, lips, neck, chest, axillae, abdomen, back, buttocks, bilateral upper extremities, bilateral lower extremities, hands, feet, fingers, toes, fingernails, and toenails. All findings within normal limits unless otherwise noted below.   Relevant physical exam findings are noted in the Assessment and Plan.  Scalp, face x 6 (6) Pink scaly macules  R axilla x 1, R lower lat leg x 1 (2) Stuck on waxy paps with erythema  Right Flank 0.6cm irregular brown macule         Assessment & Plan   SKIN CANCER SCREENING PERFORMED TODAY.  ACTINIC DAMAGE - Chronic condition, secondary to cumulative UV/sun exposure - diffuse scaly erythematous macules with underlying dyspigmentation - Recommend daily broad spectrum sunscreen SPF 30+ to sun-exposed areas, reapply every 2 hours as needed.  - Staying in the shade or wearing long sleeves, sun glasses (UVA+UVB protection) and wide brim hats (4-inch brim around the entire circumference of the hat) are also recommended for sun protection.  - Call for new or changing lesions.  LENTIGINES, SEBORRHEIC  KERATOSES, HEMANGIOMAS - Benign normal skin lesions - Benign-appearing - Call for any changes  MELANOCYTIC NEVI - Tan-brown and/or pink-flesh-colored symmetric macules and papules - Benign appearing on exam today - Observation - Call clinic for new or changing moles - Recommend daily use of broad spectrum spf 30+ sunscreen to sun-exposed areas.   HISTORY OF BASAL CELL CARCINOMA OF THE SKIN - No evidence of recurrence today - Recommend regular full body skin exams - Recommend daily broad spectrum sunscreen SPF 30+ to sun-exposed areas, reapply every 2 hours as needed.  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  Call if any new or changing lesions are noted between office visits  - L scapula   AK (actinic keratosis) (6) Scalp, face x 6  Actinic keratoses are precancerous spots that appear secondary to cumulative UV radiation exposure/sun exposure over time. They are chronic with expected duration over 1 year. A portion of actinic keratoses will progress to squamous cell carcinoma of the skin. It is not possible to reliably predict which spots will progress to skin cancer and so treatment is recommended to prevent development of skin cancer.  Recommend daily broad spectrum sunscreen SPF 30+ to sun-exposed areas, reapply every 2 hours as  needed.  Recommend staying in the shade or wearing long sleeves, sun glasses (UVA+UVB protection) and wide brim hats (4-inch brim around the entire circumference of the hat). Call for new or changing lesions.  Destruction of lesion - Scalp, face x 6 (6) Complexity: simple   Destruction method: cryotherapy   Informed consent:  discussed and consent obtained   Timeout:  patient name, date of birth, surgical site, and procedure verified Lesion destroyed using liquid nitrogen: Yes   Region frozen until ice ball extended beyond lesion: Yes   Outcome: patient tolerated procedure well with no complications   Post-procedure details: wound care instructions given    Inflamed seborrheic keratosis (2) R axilla x 1, R lower lat leg x 1  Symptomatic, irritating, patient would like treated.  Destruction of lesion - R axilla x 1, R lower lat leg x 1 (2) Complexity: simple   Destruction method: cryotherapy   Informed consent: discussed and consent obtained   Timeout:  patient name, date of birth, surgical site, and procedure verified Lesion destroyed using liquid nitrogen: Yes   Region frozen until ice ball extended beyond lesion: Yes   Outcome: patient tolerated procedure well with no complications   Post-procedure details: wound care instructions given    Neoplasm of skin Right Flank  Epidermal / dermal shaving  Lesion diameter (cm):  0.6 Informed consent: discussed and consent obtained   Timeout: patient name, date of birth, surgical site, and procedure verified   Procedure prep:  Patient was prepped and draped in usual sterile fashion Prep type:  Isopropyl alcohol Anesthesia: the lesion was anesthetized in a standard fashion   Anesthetic:  1% lidocaine w/ epinephrine 1-100,000 buffered w/ 8.4% NaHCO3 Instrument used: flexible razor blade   Hemostasis achieved with: pressure, aluminum chloride and electrodesiccation   Outcome: patient tolerated procedure well   Post-procedure details: sterile dressing applied and wound care instructions given   Dressing type: bandage, petrolatum and bacitracin    Specimen 1 - Surgical pathology Differential Diagnosis: D48.5 Nevus vs Dysplastic Nevus  Check Margins: yes 0.6cm irregular brown macule   NEVUS VS OTHER L ear inf concha Exam: 0.4cm flesh colored pap L ear  inf concha  Treatment Plan: Benign-appearing.  Observation.  Call clinic for new or changing moles.  Recommend daily use of broad spectrum spf 30+ sunscreen to sun-exposed areas.     Return in about 6 months (around 12/28/2023) for AK f/u.  I, Ardis Rowan, RMA, am acting as scribe for Armida Sans, MD .   Documentation: I have reviewed the above documentation for accuracy and completeness, and I agree with the above.  Armida Sans, MD

## 2023-06-30 NOTE — Patient Instructions (Addendum)
 Cryotherapy Aftercare  Wash gently with soap and water everyday.   Apply Vaseline and Band-Aid daily until healed.       Wound Care Instructions  Cleanse wound gently with soap and water once a day then pat dry with clean gauze. Apply a thin coat of Petrolatum (petroleum jelly, "Vaseline") over the wound (unless you have an allergy to this). We recommend that you use a new, sterile tube of Vaseline. Do not pick or remove scabs. Do not remove the yellow or white "healing tissue" from the base of the wound.  Cover the wound with fresh, clean, nonstick gauze and secure with paper tape. You may use Band-Aids in place of gauze and tape if the wound is small enough, but would recommend trimming much of the tape off as there is often too much. Sometimes Band-Aids can irritate the skin.  You should call the office for your biopsy report after 1 week if you have not already been contacted.  If you experience any problems, such as abnormal amounts of bleeding, swelling, significant bruising, significant pain, or evidence of infection, please call the office immediately.  FOR ADULT SURGERY PATIENTS: If you need something for pain relief you may take 1 extra strength Tylenol (acetaminophen) AND 2 Ibuprofen (200mg  each) together every 4 hours as needed for pain. (do not take these if you are allergic to them or if you have a reason you should not take them.) Typically, you may only need pain medication for 1 to 3 days.          Due to recent changes in healthcare laws, you may see results of your pathology and/or laboratory studies on MyChart before the doctors have had a chance to review them. We understand that in some cases there may be results that are confusing or concerning to you. Please understand that not all results are received at the same time and often the doctors may need to interpret multiple results in order to provide you with the best plan of care or course of treatment. Therefore,  we ask that you please give [place] 2 business days to thoroughly review all your results before contacting the office for clarification. Should we see a critical lab result, you will be contacted sooner.   If You Need Anything After Your Visit  If you have any questions or concerns for your doctor, please call our main line at [phone] and press option 4 to reach your doctor's medical assistant. If no one answers, please leave a voicemail as directed and we will return your call as soon as possible. Messages left after 4 pm will be answered the following business day.   You may also send [place] a message via MyChart. We typically respond to MyChart messages within 1-2 business days.  For prescription refills, please ask your pharmacy to contact our office. Our fax number is [phone].  If you have an urgent issue when the clinic is closed that cannot wait until the next business day, you can page your doctor at the number below.    Please note that while we do our best to be available for urgent issues outside of office hours, we are not available 24/7.   If you have an urgent issue and are unable to reach [place], you may choose to seek medical care at your doctor's office, retail clinic, urgent care center, or emergency room.  If you have a medical emergency, please immediately call 911 or go to the emergency department.  Pager Numbers  - Dr. Gwen Pounds: [phone]  - Dr. Roseanne Reno: [phone]  - Dr. Katrinka Blazing: [phone]   In the event of inclement weather, please call our main line at [phone] for an update on the status of any delays or closures.  Dermatology Medication Tips: Please keep the boxes that topical medications come in in order to help keep track of the instructions about where and how to use these. Pharmacies typically print the medication instructions only on the boxes and not directly on the medication tubes.   If your medication is too expensive, please contact our  office at [phone] option 4 or send [place] a message through MyChart.   We are unable to tell what your co-pay for medications will be in advance as this is different depending on your insurance coverage. However, we may be able to find a substitute medication at lower cost or fill out paperwork to get insurance to cover a needed medication.   If a prior authorization is required to get your medication covered by your insurance company, please allow [place] 1-2 business days to complete this process.  Drug prices often vary depending on where the prescription is filled and some pharmacies may offer cheaper prices.  The website www.goodrx.com contains coupons for medications through different pharmacies. The prices here do not account for what the cost may be with help from insurance (it may be cheaper with your insurance), but the website can give you the price if you did not use any insurance.  - You can print the associated coupon and take it with your prescription to the pharmacy.  - You may also stop by our office during regular business hours and pick up a GoodRx coupon card.  - If you need your prescription sent electronically to a different pharmacy, notify our office through [facility] or by phone at [phone] option 4.     Si Usted Necesita Algo Despus de Su Visita  Tambin puede enviarnos un mensaje a travs de Clinical cytogeneticist. Por lo general respondemos a los mensajes de MyChart en el transcurso de 1 a 2 das hbiles.  Para renovar recetas, por favor pida a su farmacia que se ponga en contacto con nuestra oficina. Annie Sable de fax es [place] [phone].  Si tiene un asunto urgente cuando la clnica est cerrada y que no puede esperar hasta el siguiente da hbil, puede llamar/localizar a su doctor(a) al nmero que aparece a continuacin.   Por favor, tenga en cuenta que aunque hacemos todo lo posible para estar disponibles para asuntos urgentes fuera del horario de [place], no  estamos disponibles las 24 horas del da, los 7 [address]  [address] de la [place].   Si tiene un problema urgente y no puede comunicarse con nosotros, puede optar por buscar atencin mdica  en el consultorio de su doctor(a), en una clnica privada, en un centro de atencin urgente o en una sala de emergencias.  Si tiene Engineer, drilling, por favor llame inmediatamente al 911 o vaya a la sala de emergencias.  Nmeros de bper  - Dr. Gwen Pounds: [phone]  - Dra. Roseanne Reno: [phone]  - Dr. Katrinka Blazing: [phone]   En caso de inclemencias del tiempo, por favor llame a Lacy Duverney principal al [phone] para una actualizacin sobre el [place] de cualquier retraso o cierre.  Consejos para la medicacin en dermatologa: Por favor, guarde las cajas en las que vienen los medicamentos de uso tpico para ayudarle a seguir las instrucciones sobre dnde y cmo usarlos. [employer]  generalmente imprimen las instrucciones del medicamento slo en las cajas y no directamente en los tubos del [place].   Si su medicamento es muy caro, por favor, pngase en contacto con Rolm Gala llamando al [phone] y presione la opcin 4 o envenos un mensaje a travs de Clinical cytogeneticist.   No podemos decirle cul ser su copago por los medicamentos por adelantado ya que esto es diferente dependiendo de la cobertura de su seguro. Sin embargo, es posible que podamos encontrar un medicamento sustituto a Audiological scientist un formulario para que el seguro cubra el medicamento que se considera necesario.   Si se requiere una autorizacin previa para que su compaa de seguros [place] su medicamento, por favor permtanos de 1 a 2 das hbiles para completar [address]  Los precios de los medicamentos varan con frecuencia dependiendo del Environmental consultant de dnde se surte la receta y alguna farmacias pueden ofrecer precios ms baratos.  El sitio web www.goodrx.com tiene cupones para medicamentos de Health and safety inspector. Los precios  aqu no tienen en cuenta lo que podra costar con la ayuda del seguro (puede ser ms barato con su seguro), pero el sitio web puede darle el precio si no utiliz Tourist information centre manager.  - Puede imprimir el cupn correspondiente y llevarlo con su receta a la farmacia.  - Tambin puede pasar por nuestra oficina durante el horario de atencin regular y Education officer, museum una tarjeta de cupones de GoodRx.  - Si necesita que su receta se enve electrnicamente a una farmacia diferente, informe a nuestra oficina a travs de MyChart de [place] o por telfono llamando al [phone] y presione la opcin 4.

## 2023-07-02 ENCOUNTER — Encounter: Payer: Self-pay | Admitting: Dermatology

## 2023-07-06 ENCOUNTER — Telehealth: Payer: Self-pay

## 2023-07-06 NOTE — Telephone Encounter (Signed)
Advised pt of bx result/sh ?

## 2023-07-06 NOTE — Telephone Encounter (Signed)
-----   Message from Armida Sans sent at 07/06/2023  5:34 PM EDT ----- FINAL DIAGNOSIS        1. Skin, right flank :       DYSPLASTIC JUNCTIONAL NEVUS WITH MODERATE ATYPIA, DEEP MARGIN INVOLVED   Moderate dysplastic Recheck next visit

## 2023-08-25 ENCOUNTER — Ambulatory Visit: Payer: Medicare PPO | Admitting: Dermatology

## 2023-11-22 DIAGNOSIS — Z794 Long term (current) use of insulin: Secondary | ICD-10-CM | POA: Diagnosis not present

## 2023-11-22 DIAGNOSIS — E785 Hyperlipidemia, unspecified: Secondary | ICD-10-CM | POA: Diagnosis not present

## 2023-11-22 DIAGNOSIS — I10 Essential (primary) hypertension: Secondary | ICD-10-CM | POA: Diagnosis not present

## 2023-11-22 DIAGNOSIS — E119 Type 2 diabetes mellitus without complications: Secondary | ICD-10-CM | POA: Diagnosis not present

## 2023-11-24 DIAGNOSIS — E7800 Pure hypercholesterolemia, unspecified: Secondary | ICD-10-CM | POA: Diagnosis not present

## 2023-11-24 DIAGNOSIS — E119 Type 2 diabetes mellitus without complications: Secondary | ICD-10-CM | POA: Diagnosis not present

## 2023-11-24 DIAGNOSIS — I10 Essential (primary) hypertension: Secondary | ICD-10-CM | POA: Diagnosis not present

## 2023-11-24 NOTE — Progress Notes (Signed)
 Established Patient Visit   Chief Complaint: Chief Complaint  Patient presents with  . Hypertension  . Hyperlipidemia  . Pre-op Exam   Date of Service: 11/24/2023 Date of Birth: 08/11/1946 PCP: Diedra Jerona Ruts, MD  History of Present Illness: Mr. Jeremiah Zhang is a 78 y.o.male patient who returns for preoperative cardiovascular evaluation, essential hypertension, type 2 diabetes, history of DVT and abnormal ECG.  The patient is awaiting right shoulder replacement surgery.  He denies exertional chest pain or shortness of breath.  He denies palpitations or heart racing.  He denies peripheral edema.  The patient is not very active and does not exercise regularly.  ECG 11/02/2023 revealed normal sinus rhythm with first-degree AV block at 83 bpm.  The patient underwent stress echocardiogram 11/18/2023, exercised 6 minutes on the Bruce protocol without chest pain or ischemic ECG changes.  At baseline, 2D echocardiogram revealed normal left ventricular function, with LVEF greater than 55%.  At peak exercise, there was appropriate augmentation of all myocardial segments, with estimate LVEF greater than 70%, without evidence for scar or ischemia.   The patient has essential hypertension, blood pressure well-controlled, currently on bisoprolol -HCTZ, furosemide , quinapril and amlodipine , which are well-tolerated without apparent side effects.  The patient tries to follow a low-sodium, no added salt diet.   The patient has type 2 diabetes, on insulin , glimepiride , pioglitazone , and semaglutide, which are well-tolerated without apparent side effects, followed by his primary care provider.  The patient follows a low carbohydrate, ADA diet.  Past Medical and Surgical History  Past Medical History Past Medical History:  Diagnosis Date  . Bee sting allergy    Anaphylaxis.  . Chicken pox   . Erectile dysfunction   . FH: colon polyps 02/28/2017  . GERD (gastroesophageal reflux disease)    rare - takes Nexium   . Hemorrhoids, internal, with bleeding 11/16/2018  . 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 Hyperlipidemia   . Hypertension   . Obesity   . Post-phlebitic syndrome    Right lower extremity.  . Prostate cancer (CMS/HHS-HCC) 05/13/2015  . Renal insufficiency   . Rheumatoid arthritis (CMS/HHS-HCC)    big toes  . Sleep apnea    uses Breathe Right  . TIA (transient ischemic attack) 08/2014   Questionable   . Type 2 diabetes mellitus (CMS/HHS-HCC)     Past Surgical History He has a past surgical history that includes Hernia repair (2013); Fiberoptic cystoscopy with dilation of urethral stricture (08/13/2008); Left elbow surgery; Bursectomy secondary to Staph infection; Knee arthroscopy (Left, 11/02/2002); Colonoscopy (11/12/2010, 03/30/2005, 01/09/1999, 03/10/1993); egd (10/05/2012); Left subacromial decompression and rotator cuff tear ( supraspinatus) (Left, 09/04/2014); Vasectomy; Right total knee arthroplasty (07/2008); Left total knee arthroplasty (10/2008); cryotherapy of prostate; cryotherapy of prostate; Colonoscopy (05/30/2017); and Colonoscopy (12/28/2021).   Medications and Allergies  Current Medications  Current Outpatient Medications  Medication Sig Dispense Refill  . amLODIPine  (NORVASC ) 5 MG tablet TAKE 1 TABLET(5 MG) BY MOUTH EVERY DAY 90 tablet 3  . bisoproloL -hydroCHLOROthiazide (ZIAC) 10-6.25 mg tablet TAKE 1 TABLET BY MOUTH EVERY DAY 90 tablet 3  . blood glucose diagnostic (ONETOUCH VERIO TEST STRIPS) test strip TEST TWICE DAILY 200 strip 3  . ELIQUIS  5 mg tablet TAKE 1 TABLET(5 MG) BY MOUTH TWICE DAILY 180 tablet 3  . EPINEPHrine (EPIPEN) 0.3 mg/0.3 mL auto-injector INJECT 0.3 MG INTO THE MUSCLE ONCE AS NEEDED FOR ANAPHYLAXIS 2 each 0  . FUROsemide  (LASIX ) 20 MG tablet Take 1 tablet (20 mg total) by mouth once  daily 90 tablet 1  . insulin  ASPART (NOVOLOG  FLEXPEN) pen injector (concentration 100 units/mL) Inject 12 units before meals, up to three times daily. 15  mL 5  . insulin  GLARGINE-yfgn (SEMGLEE ) injection (concentration 100 units/mL) Inject 0.22 mLs (22 Units total) subcutaneously at bedtime    . ketoconazole (NIZORAL) 2 % cream Apply topically once daily until resolution of rash, typically 1-3 weeks. 30 g 0  . lancets Use 1 each 2 (two) times daily Use as instructed. 200 each 3  . metFORMIN  (GLUCOPHAGE ) 1000 MG tablet Take 0.5 tablets (500 mg total) by mouth 2 (two) times daily with meals    . pen needle, diabetic 31 gauge x 1/4 needle Use as directed 4 times daily to inject insulin . 400 each 3  . pioglitazone  (ACTOS ) 45 MG tablet Take 1 tablet (45 mg total) by mouth once daily    . quinapriL (ACCUPRIL) 20 MG tablet TAKE 1 TABLET BY MOUTH TWICE DAILY 180 tablet 1  . red yeast rice 600 mg Cap Take by mouth 2 (two) times daily     No current facility-administered medications for this visit.    Allergies: Bee venom protein (honey bee), Crestor [rosuvastatin], Dicloxacillin, Empagliflozin, and Vytorin 10-10 [ezetimibe-simvastatin]  Social and Family History  Social History  reports that he has never smoked. He has never used smokeless tobacco. He reports that he does not drink alcohol  and does not use drugs.  Family History Family History  Problem Relation Name Age of Onset  . Colon cancer Mother    . High blood pressure (Hypertension) Mother    . Coronary Artery Disease (Blocked arteries around heart) Mother    . Myocardial Infarction (Heart attack) Mother    . Coronary Artery Disease (Blocked arteries around heart) Father    . Diabetes type II Father    . Colon polyps Other      Review of Systems   Review of Systems: The patient denies chest pain, shortness of breath, orthopnea, paroxysmal nocturnal dyspnea, pedal edema, palpitations, heart racing, presyncope, syncope. Review of 12 Systems is negative except as described above.  Physical Examination   Vitals:BP 118/74   Pulse 79   Ht 177.8 cm (5' 10)   Wt (!) 112.5 kg (248 lb)    SpO2 94%   BMI 35.58 kg/m  Ht:177.8 cm (5' 10) Wt:(!) 112.5 kg (248 lb) ADJ:Anib surface area is 2.36 meters squared. Body mass index is 35.58 kg/m.  HEENT: Pupils equally reactive to light and accomodation  Neck: Supple without thyromegaly, carotid pulses 2+ Lungs: clear to auscultation bilaterally; no wheezes, rales, rhonchi Heart: Regular rate and rhythm.  No gallops, murmurs or rub Abdomen: soft nontender, nondistended, with normal bowel sounds Extremities: no cyanosis, clubbing, or edema Peripheral Pulses: 2+ in all extremities, 2+ femoral pulses bilaterally  Assessment   78 y.o. male with  1. Primary hypertension   2. Diabetes mellitus without complication (CMS/HHS-HCC)   3. Pure hypercholesterolemia    78 year old gentleman with multiple cardiovascular risk factors, referred for preoperative cardiovascular evaluation prior to right total shoulder surgery.  Stress echocardiogram 11/18/2023 revealed normal left ventricular function, without evidence for scar or ischemia.  The patient should be at low risk for serious cardiovascular complication during shoulder surgery.  The patient has essential hypertension, blood pressure well-controlled on current BP medications.       Plan    1.  Continue current medications 2.  Counseled patient about low-sodium diet 3.  DASH diet printed instructions given to the  patient 4.  Counseled patient about low-cholesterol diet 5.  Low fat and cholesterol diet print instructions given to the patient 6.  Diabetes diet printed instructions given to the patient 7.  Proceed with right shoulder surgery 8.  Defer further cardiac diagnostics at this time 9.  Continue bisoprolol -HCTZ pre-, peri and postoperatively 10.  Return to clinic for follow-up in 6 months    No orders of the defined types were placed in this encounter.   Return in about 6 months (around 05/23/2024).  MARSA DOOMS, MD PhD [facility]

## 2023-11-28 ENCOUNTER — Other Ambulatory Visit: Payer: Self-pay | Admitting: *Deleted

## 2023-11-28 DIAGNOSIS — E291 Testicular hypofunction: Secondary | ICD-10-CM

## 2023-11-28 DIAGNOSIS — Z8546 Personal history of malignant neoplasm of prostate: Secondary | ICD-10-CM

## 2023-12-02 ENCOUNTER — Other Ambulatory Visit: Payer: Medicare HMO

## 2023-12-02 DIAGNOSIS — Z8546 Personal history of malignant neoplasm of prostate: Secondary | ICD-10-CM

## 2023-12-02 DIAGNOSIS — E291 Testicular hypofunction: Secondary | ICD-10-CM

## 2023-12-03 LAB — HEMATOCRIT: Hematocrit: 37.3 % — ABNORMAL LOW (ref 37.5–51.0)

## 2023-12-03 LAB — PSA: Prostate Specific Ag, Serum: <0.1 ng/mL (ref 0.0–4.0)

## 2023-12-03 LAB — TESTOSTERONE: Testosterone: 105 ng/dL — ABNORMAL LOW (ref 264–916)

## 2023-12-05 ENCOUNTER — Ambulatory Visit: Payer: PPO | Admitting: Urology

## 2023-12-07 ENCOUNTER — Ambulatory Visit (INDEPENDENT_AMBULATORY_CARE_PROVIDER_SITE_OTHER): Payer: Medicare HMO | Admitting: Urology

## 2023-12-07 VITALS — BP 114/68 | HR 83 | Ht 69.0 in | Wt 239.0 lb

## 2023-12-07 DIAGNOSIS — E291 Testicular hypofunction: Secondary | ICD-10-CM

## 2023-12-07 DIAGNOSIS — Z8546 Personal history of malignant neoplasm of prostate: Secondary | ICD-10-CM

## 2023-12-07 NOTE — Progress Notes (Signed)
I, Jeremiah Zhang, acting as a scribe for Jeremiah Altes, MD., have documented all relevant documentation on the behalf of Jeremiah Altes, MD, as directed by Jeremiah Altes, MD while in the presence of Jeremiah Altes, MD.  12/07/2023 2:02 PM   Bonna Gains Gully 1946/05/12 [id number]  Referring provider: Kandyce Rud, MD [phone] [address] [facility] [place],  [place] [address]  Chief Complaint  Patient presents with   Hypogonadism   Urologic history: 1.  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  Hypogonadism Started TRT July 2019   3.  Erectile dysfunction  HPI: Jeremiah Zhang is a 78 y.o. male presents for annual follow-up  Since last year's visit he has experienced increased tiredness, fatigue, and low energy.  He discontinued his topical testosterone due to variable efficacy.  Labs 12/02/23 recommend remarkable for a testosterone level of 105 ng/dL, hematocrit 84.1, and PSA undetectable at <0.1 No bothersome LUTS No dysuria gross hematuria No flank, abdominal or pelvic pain  PSA trend   Prostate Specific Ag, Serum  Latest Ref Rng 0.0 - 4.0 ng/mL  04/26/2019 <0.1   10/31/2019 <0.1   04/28/2020 <0.1   10/27/2020 <0.1   04/30/2021 <0.1   11/02/2021 <0.1   11/08/2022 <0.1   12/02/2023 <0.1     PMH: Past Medical History:  Diagnosis Date   Actinic keratosis    Anticoagulant long-term use    ELIQUIS   Arthritis    Basal cell carcinoma 01/27/2010   Right spinal upper back. Superficial.    Basal cell carcinoma 11/15/2019   Left inferior antihelix post. to tragus. Superficial and nodular patterns. Excised: 11/27/2019   Basal cell carcinoma 11/15/2019   Left mid lateral nose. Nodular and infiltrative  patterns. Excised: 11/27/2019   Basal cell carcinoma 11/27/2019   Left mid superior helix. BCC with focal sclerosis.   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 Slight atypia. Edges free.   Dysplastic nevus 06/30/2023   R flank, moderate atypia   ED (erectile dysfunction)    GERD (gastroesophageal reflux disease)    Hiatal hernia    History of DVT of lower extremity    POST OP KNEE SURGERY   History of transient ischemic attack (TIA)    PER PT HX  ?POSSIBLE-- PT ON ELIQUIS   Hyperlipidemia    Hypertension    Hypogonadism male    OSA (obstructive sleep apnea)    per pt positive sleep study yrs ago recommended CPAP --  pt Non- compliant -- states uses Breath Rite Nasal Strips   Peripheral neuropathy    Prostate cancer [facility]) urologist-  dr Patsi Sears  oncologist-- dr Kathrynn Running   dx 03-25-2015  T1c, Gleason 4+3  s/p  cryoablation prostate 06-02-2015/  external  radiation therapy 08-16-2016 to 09-20-2016/  scheduled for radioactive seed implants 10-22-2016   TIA (transient ischemic attack)    ?? 08/20/2014? Eliquis 07/02/15   Type 2 diabetes mellitus (HCC)    followed by dr Kandyce Rud [facility] clinic)  last A1c 8.4 on 07-27-2016 per lov note   Umbilical hernia  Wears glasses    Wears hearing aid    bilateral    Surgical History: Past Surgical History:  Procedure Laterality Date   COLONOSCOPY WITH PROPOFOL N/A 05/30/2017   Procedure: COLONOSCOPY WITH PROPOFOL;  Surgeon: Scot Jun, MD;  Location: [facility] ENDOSCOPY;  Service: Endoscopy;  Laterality: N/A;   COLONOSCOPY WITH PROPOFOL N/A 12/28/2021   Procedure: COLONOSCOPY WITH PROPOFOL;  Surgeon: Jaynie Collins, DO;  Location: [facility] ENDOSCOPY;  Service: Gastroenterology;  Laterality: N/A;   CRYOABLATION N/A 06/02/2015   Procedure: CRYO ABLATION PROSTATE;  Surgeon:  Jethro Bolus, MD;  Location: [facility];  Service: Urology;  Laterality: N/A;   CYSTOSCOPY WITH URETHRAL DILATATION N/A 05/04/2019   Procedure: CYSTOSCOPY WITH URETHRAL DILATATION;  Surgeon: Sebastian Ache, MD;  Location: [facility];  Service: Urology;  Laterality: N/A;   ELBOW BURSA SURGERY Left Feb 1997   prostate biopsies     x12, cryotherapy   RADIOACTIVE SEED IMPLANT N/A 10/22/2016   Procedure: RADIOACTIVE SEED IMPLANT/BRACHYTHERAPY IMPLANT, URETHRAL STRICTURE DIALIATION;  Surgeon: Jethro Bolus, MD;  Location[facility];  Service: Urology;  Laterality: N/A;   SHOULDER OPEN ROTATOR CUFF REPAIR Left 09-04-2014   skin cancer L ear     Dr Gwen Pounds    TOTAL KNEE ARTHROPLASTY Bilateral right 08-13-2008  &  left 11-14-2008   UMBILICAL HERNIA REPAIR  06-23-2012   ureter stretch  05/02/2019   [facility] Dr Berneice Heinrich    Home Medications:  Allergies as of 12/07/2023       Reactions   Bee Venom Anaphylaxis   Crestor [rosuvastatin] Other (See Comments)   "muscle weakness and discomfort"   Vytorin [ezetimibe-simvastatin] Other (See Comments)   "muscle weakness and discomfort"   Dynapen [dicloxacillin] Rash   Jardiance [empagliflozin] Rash   Yeast infection        Medication List        Accurate as of December 07, 2023  2:02 PM. If you have any questions, ask your nurse or doctor.          STOP taking these medications    amLODipine 5 MG tablet Commonly known as: NORVASC   fluconazole 150 MG tablet Commonly known as: DIFLUCAN   fluticasone 50 MCG/ACT nasal spray Commonly known as: FLONASE   Na Sulfate-K Sulfate-Mg Sulfate concentrate 17.5-3.13-1.6 GM/177ML Soln Commonly known as: SUPREP   oxyCODONE-acetaminophen 5-325 MG tablet Commonly known as: PERCOCET/ROXICET   tiZANidine 2 MG tablet Commonly known as: ZANAFLEX   TYLENOL PO       TAKE these medications    Accu-Chek FastClix Lancets Misc    ADVIL PO Take 2 tablets by mouth as needed.   apixaban 5 MG Tabs tablet Commonly known as: ELIQUIS TAKE 1 TABLET(5 MG) BY MOUTH TWICE DAILY   ascorbic acid 500 MG tablet Commonly known as: VITAMIN C Take 1,000 mg by mouth daily.   bisoprolol-hydrochlorothiazide 10-6.25 MG tablet Commonly known as: ZIAC TAKE 1 TABLET BY MOUTH EVERY DAY   EPINEPHrine 0.3 mg/0.3 mL Soaj injection Commonly known as: EPI-PEN Inject into the muscle as needed.   EPINEPHrine 0.3 mg/0.3 mL Soaj injection Commonly known as: EPI-PEN INJECT 0.3 MG INTO THE MUSCLE ONCE AS NEEDED FOR ANAPHYLAXIS   esomeprazole 40 MG capsule Commonly known as: NEXIUM Take 40 mg by mouth every evening.   EXCEDRIN PO Take 2-3 tablets by mouth. Rarely as needed for headache. Once in 3 mo max. +2 more in 1/2 if a severe headache.   Flowflex COVID-19  Ag Home Test Kit Generic drug: COVID-19 At Home Antigen Test TEST AS DIRECTED TODAY   furosemide 40 MG tablet Commonly known as: LASIX Take 40 mg by mouth daily.   glimepiride 2 MG tablet Commonly known as: AMARYL TAKE 1 TABLET BY MOUTH TWICE DAILY   glucose blood test strip TEST TWICE DAILY AS INSTRUCTED   metFORMIN 1000 MG tablet Commonly known as: GLUCOPHAGE Take by mouth.   metFORMIN 1000 MG tablet Commonly known as: GLUCOPHAGE TAKE 1 TABLET(1000 MG) BY MOUTH TWICE DAILY WITH MEALS   NovoLOG FlexPen 100 UNIT/ML FlexPen Generic drug: insulin aspart SMARTSIG:10 Unit(s) SUB-Q 3 Times Daily   quinapril 20 MG tablet Commonly known as: ACCUPRIL TAKE 1 TABLET BY MOUTH TWICE DAILY   RED YEAST RICE PO Take by mouth 2 (two) times daily.   sitaGLIPtin 100 MG tablet Commonly known as: JANUVIA Take by mouth.   Testosterone 20.25 MG/ACT (1.62%) Gel Apply 1 pump to 1 shoulder and 2 pumps to the other shoulder daily (3 pumps total)   Vitamin D3 50 MCG (2000 UT) Tabs Take 1 capsule by mouth daily.        Allergies:  Allergies  Allergen Reactions   Bee  Venom Anaphylaxis   Crestor [Rosuvastatin] Other (See Comments)    "muscle weakness and discomfort"   Vytorin [Ezetimibe-Simvastatin] Other (See Comments)    "muscle weakness and discomfort"   Dynapen [Dicloxacillin] Rash   Jardiance [Empagliflozin] Rash    Yeast infection    Family History: Family History  Problem Relation Age of Onset   Cancer Mother 19       colon/resection    Social History:  reports that he has never smoked. He has never used smokeless tobacco. He reports that he does not drink alcohol and does not use drugs.   Physical Exam: BP 114/68   Pulse 83   Ht 5\' 9"  (1.753 m)   Wt 239 lb (108.4 kg)   BMI 35.29 kg/m   Constitutional:  Alert and oriented, No acute distress. HEENT: [place] AT, moist mucus membranes.  Trachea midline, no masses. Cardiovascular: No clubbing, cyanosis, or edema. Respiratory: Normal respiratory effort, no increased work of breathing. GI: Abdomen is soft, nontender, nondistended, no abdominal masses Skin: No rashes, bruises or suspicious lesions. Neurologic: Grossly intact, no focal deficits, moving all 4 extremities. Psychiatric: Normal mood and affect.  Assessment & Plan:    1. Hypogonadism Testosterone level 105 ng/dL. We discussed the option of converting to intramuscular testosterone injections. The patient has 2 bottles of topical testosterone at home and wishes to retry this form of treatment.  Follow up in 6 weeks for repeat testosterone level assessment. If levels remain low and symptoms are bothersome, consider intramuscular injections.  2. History of prostate cancer PSA remains undetectable. Schedule follow-up labs and annual follow-up after reassessment of testosterone levels in 6 weeks.  [facility] [address] [place], [place] [address] [phone]

## 2023-12-08 ENCOUNTER — Encounter: Payer: Self-pay | Admitting: Urology

## 2023-12-29 ENCOUNTER — Ambulatory Visit: Payer: Medicare PPO | Admitting: Dermatology

## 2024-01-23 ENCOUNTER — Other Ambulatory Visit: Payer: Self-pay

## 2024-01-23 DIAGNOSIS — E291 Testicular hypofunction: Secondary | ICD-10-CM

## 2024-01-24 ENCOUNTER — Other Ambulatory Visit: Payer: Medicare HMO

## 2024-01-24 DIAGNOSIS — E291 Testicular hypofunction: Secondary | ICD-10-CM

## 2024-01-25 LAB — TESTOSTERONE: Testosterone: 149 ng/dL — ABNORMAL LOW (ref 264–916)

## 2024-01-26 ENCOUNTER — Encounter: Payer: Self-pay | Admitting: *Deleted

## 2024-01-30 NOTE — Patient Instructions (Addendum)
 DUE TO COVID-19 ONLY TWO VISITORS  (aged 80 and older)  ARE ALLOWED TO COME WITH YOU AND STAY IN THE WAITING ROOM ONLY DURING PRE OP AND PROCEDURE.   **NO VISITORS ARE ALLOWED IN THE SHORT STAY AREA OR RECOVERY ROOM!!**  IF YOU WILL BE ADMITTED INTO THE HOSPITAL YOU ARE ALLOWED ONLY FOUR SUPPORT PEOPLE DURING VISITATION HOURS ONLY (7 AM -8PM)   The support person(s) must pass our screening, gel in and out, and wear a mask at all times, including in the patient's room. Patients must also wear a mask when staff or their support person are in the room. Visitors GUEST BADGE MUST BE WORN VISIBLY  One adult visitor may remain with you overnight and MUST be in the room by 8 P.M.     Your procedure is scheduled on: 02/10/24   Report to [facility] Main Entrance    Report to admitting at : 5:15 AM   Call this number if you have problems the morning of surgery [phone]   Do not eat food :After Midnight.   After Midnight you may have the following liquids until : 4:30 AM DAY OF SURGERY  Water Black Coffee (sugar ok, NO MILK/CREAM OR CREAMERS)  Tea (sugar ok, NO MILK/CREAM OR CREAMERS) regular and decaf                             Plain Jell-O (NO RED)                                           Fruit ices (not with fruit pulp, NO RED)                                     Popsicles (NO RED)                                                                  Juice: apple, WHITE grape, WHITE cranberry Sports drinks like Gatorade (NO RED)   The day of surgery:  Drink ONE (1) Pre-Surgery Clear G2 at : 4:30 AM the morning of surgery. Drink in one sitting. Do not sip.  This drink was given to you during your hospital  pre-op appointment visit. Nothing else to drink after completing the  Pre-Surgery Clear Ensure or G2.          If you have questions, please contact your surgeon's office.  FOLLOW ANY ADDITIONAL PRE OP INSTRUCTIONS YOU RECEIVED FROM YOUR SURGEON'S OFFICE!!!   Oral Hygiene  is also important to reduce your risk of infection.                                    Remember - BRUSH YOUR TEETH THE MORNING OF SURGERY WITH YOUR REGULAR TOOTHPASTE  DENTURES WILL BE REMOVED PRIOR TO SURGERY PLEASE DO NOT APPLY "Poly grip" OR ADHESIVES!!!   Do NOT smoke after Midnight   Take these medicines the morning of surgery  with A SIP OF WATER: bisoprolol,amlodipine,omeprazole.  How to Manage Your Diabetes Before and After Surgery  Why is it important to control my blood sugar before and after surgery? Improving blood sugar levels before and after surgery helps healing and can limit problems. A way of improving blood sugar control is eating a healthy diet by:  Eating less sugar and carbohydrates  Increasing activity/exercise  Talking with your doctor about reaching your blood sugar goals High blood sugars (greater than 180 mg/dL) can raise your risk of infections and slow your recovery, so you will need to focus on controlling your diabetes during the weeks before surgery. Make sure that the doctor who takes care of your diabetes knows about your planned surgery including the date and location.  How do I manage my blood sugar before surgery? Check your blood sugar at least 4 times a day, starting 2 days before surgery, to make sure that the level is not too high or low. Check your blood sugar the morning of your surgery when you wake up and every 2 hours until you get to the Short Stay unit. If your blood sugar is less than 70 mg/dL, you will need to treat for low blood sugar: Do not take insulin. Treat a low blood sugar (less than 70 mg/dL) with  cup of clear juice (cranberry or apple), 4 glucose tablets, OR glucose gel. Recheck blood sugar in 15 minutes after treatment (to make sure it is greater than 70 mg/dL). If your blood sugar is not greater than 70 mg/dL on recheck, call [phone] for further instructions. Report your blood sugar to the short stay nurse when you get to  Short Stay.  If you are admitted to the hospital after surgery: Your blood sugar will be checked by the staff and you will probably be given insulin after surgery (instead of oral diabetes medicines) to make sure you have good blood sugar levels. The goal for blood sugar control after surgery is 80-180 mg/dL.   WHAT DO I DO ABOUT MY DIABETES MEDICATION?  THE NIGHT BEFORE SURGERY, take ONLY half (13 units ) of glargine insulin. DO NOT take PM dose of Amaryl. DO NOT take bedtime dose of novolog insulin     THE MORNING OF SURGERY, take   units of         insulin.DO NOT TAKE ANY ORAL DIABETIC MEDICATIONS DAY OF YOUR SURGERY (metformin,Actos).  DO NOT TAKE THE FOLLOWING 7 DAYS PRIOR TO SURGERY: Ozempic, Wegovy, Rybelsus (Semaglutide), Byetta (exenatide), Bydureon (exenatide ER), Victoza, Saxenda (liraglutide), or Trulicity (dulaglutide) Mounjaro (Tirzepatide) Adlyxin (Lixisenatide), Polyethylene Glycol Loxenatide.  If your CBG is greater than 220 mg/dL, you may take  of your sliding scale  (Correction: novolog) dose of insulin.  Bring CPAP mask and tubing day of surgery.                              You may not have any metal on your body including hair pins, jewelry, and body piercing             Do not wear lotions, powders, perfumes/cologne, or deodorant.              Men may shave face and neck.   Do not bring valuables to the hospital. [facility] IS NOT             RESPONSIBLE   FOR VALUABLES.   Contacts, glasses, or bridgework may not  be worn into surgery.   Bring small overnight bag day of surgery.   DO NOT BRING YOUR HOME MEDICATIONS TO THE HOSPITAL. PHARMACY WILL DISPENSE MEDICATIONS LISTED ON YOUR MEDICATION LIST TO YOU DURING YOUR ADMISSION IN THE HOSPITAL!    Patients discharged on the day of surgery will not be allowed to drive home.  Someone NEEDS to stay with you for the first 24 hours after anesthesia.   Special Instructions: Bring a copy of your healthcare power of  attorney and living will documents         the day of surgery if you haven't scanned them before.              Please read over the following fact sheets you were given: IF YOU HAVE QUESTIONS ABOUT YOUR PRE-OP INSTRUCTIONS PLEASE CALL [phone]    - Preparing for Total Shoulder Arthroplasty    Before surgery, you can play an important role. Because skin is not sterile, your skin needs to be as free of germs as possible. You can reduce the number of germs on your skin by using the following products. Benzoyl Peroxide Gel Reduces the number of germs present on the skin Applied twice a day to shoulder area starting two days before surgery    ==================================================================  Please follow these instructions carefully:  BENZOYL PEROXIDE 5% GEL  Please do not use if you have an allergy to benzoyl peroxide.   If your skin becomes reddened/irritated stop using the benzoyl peroxide.  Starting two days before surgery, apply as follows: Apply benzoyl peroxide in the morning and at night. Apply after taking a shower. If you are not taking a shower clean entire shoulder front, back, and side along with the armpit with a clean wet washcloth.  Place a quarter-sized dollop on your shoulder and rub in thoroughly, making sure to cover the front, back, and side of your shoulder, along with the armpit.   2 days before ____ AM   ____ PM              1 day before ____ AM   ____ PM                         Do this twice a day for two days.  (Last application is the night before surgery, AFTER using the CHG soap as described below).  Do NOT apply benzoyl peroxide gel on the day of surgery.  Pre-operative 5 CHG Bath Instructions   You can play a key role in reducing the risk of infection after surgery. Your skin needs to be as free of germs as possible. You can reduce the number of germs on your skin by washing with CHG (chlorhexidine gluconate) soap before  surgery. CHG is an antiseptic soap that kills germs and continues to kill germs even after washing.   DO NOT use if you have an allergy to chlorhexidine/CHG or antibacterial soaps. If your skin becomes reddened or irritated, stop using the CHG and notify one of our RNs at [phone].   Please shower with the CHG soap starting 4 days before surgery using the following schedule:     Please keep in mind the following:  DO NOT shave, including legs and underarms, starting the day of your first shower.   You may shave your face at any point before/day of surgery.  Place clean sheets on your bed the day you start using CHG soap. Use a clean  washcloth (not used since being washed) for each shower. DO NOT sleep with pets once you start using the CHG.   CHG Shower Instructions:  If you choose to wash your hair and private area, wash first with your normal shampoo/soap.  After you use shampoo/soap, rinse your hair and body thoroughly to remove shampoo/soap residue.  Turn the water OFF and apply about 3 tablespoons (45 ml) of CHG soap to a CLEAN washcloth.  Apply CHG soap ONLY FROM YOUR NECK DOWN TO YOUR TOES (washing for 3-5 minutes)  DO NOT use CHG soap on face, private areas, open wounds, or sores.  Pay special attention to the area where your surgery is being performed.  If you are having back surgery, having someone wash your back for you may be helpful. Wait 2 minutes after CHG soap is applied, then you may rinse off the CHG soap.  Pat dry with a clean towel  Put on clean clothes/pajamas   If you choose to wear lotion, please use ONLY the CHG-compatible lotions on the back of this paper.     Additional instructions for the day of surgery: DO NOT APPLY any lotions, deodorants, cologne, or perfumes.   Put on clean/comfortable clothes.  Brush your teeth.  Ask your nurse before applying any prescription medications to the skin.    CHG Compatible Lotions   Aveeno Moisturizing lotion   Cetaphil Moisturizing Cream  Cetaphil Moisturizing Lotion  Clairol Herbal Essence Moisturizing Lotion, Dry Skin  Clairol Herbal Essence Moisturizing Lotion, Extra Dry Skin  Clairol Herbal Essence Moisturizing Lotion, Normal Skin  Curel Age Defying Therapeutic Moisturizing Lotion with Alpha Hydroxy  Curel Extreme Care Body Lotion  Curel Soothing Hands Moisturizing Hand Lotion  Curel Therapeutic Moisturizing Cream, Fragrance-Free  Curel Therapeutic Moisturizing Lotion, Fragrance-Free  Curel Therapeutic Moisturizing Lotion, Original Formula  Eucerin Daily Replenishing Lotion  Eucerin Dry Skin Therapy Plus Alpha Hydroxy Crme  Eucerin Dry Skin Therapy Plus Alpha Hydroxy Lotion  Eucerin Original Crme  Eucerin Original Lotion  Eucerin Plus Crme Eucerin Plus Lotion  Eucerin TriLipid Replenishing Lotion  Keri Anti-Bacterial Hand Lotion  Keri Deep Conditioning Original Lotion Dry Skin Formula Softly Scented  Keri Deep Conditioning Original Lotion, Fragrance Free Sensitive Skin Formula  Keri Lotion Fast Absorbing Fragrance Free Sensitive Skin Formula  Keri Lotion Fast Absorbing Softly Scented Dry Skin Formula  Keri Original Lotion  Keri Skin Renewal Lotion Keri Silky Smooth Lotion  Keri Silky Smooth Sensitive Skin Lotion  Nivea Body Creamy Conditioning Oil  Nivea Body Extra Enriched Lotion  Nivea Body Original Lotion  Nivea Body Sheer Moisturizing Lotion Nivea Crme  Nivea Skin Firming Lotion  NutraDerm 30 Skin Lotion  NutraDerm Skin Lotion  NutraDerm Therapeutic Skin Cream  NutraDerm Therapeutic Skin Lotion  ProShield Protective Hand Cream  Provon moisturizing lotion   Incentive Spirometer  An incentive spirometer is a tool that can help keep your lungs clear and active. This tool measures how well you are filling your lungs with each breath. Taking long deep breaths may help reverse or decrease the chance of developing breathing (pulmonary) problems (especially infection)  following: A long period of time when you are unable to move or be active. BEFORE THE PROCEDURE  If the spirometer includes an indicator to show your best effort, your nurse or respiratory therapist will set it to a desired goal. If possible, sit up straight or lean slightly forward. Try not to slouch. Hold the incentive spirometer in an upright position. INSTRUCTIONS FOR USE  Sit on the edge of your bed if possible, or sit up as far as you can in bed or on a chair. Hold the incentive spirometer in an upright position. Breathe out normally. Place the mouthpiece in your mouth and seal your lips tightly around it. Breathe in slowly and as deeply as possible, raising the piston or the ball toward the top of the column. Hold your breath for 3-5 seconds or for as long as possible. Allow the piston or ball to fall to the bottom of the column. Remove the mouthpiece from your mouth and breathe out normally. Rest for a few seconds and repeat Steps 1 through 7 at least 10 times every 1-2 hours when you are awake. Take your time and take a few normal breaths between deep breaths. The spirometer may include an indicator to show your best effort. Use the indicator as a goal to work toward during each repetition. After each set of 10 deep breaths, practice coughing to be sure your lungs are clear. If you have an incision (the cut made at the time of surgery), support your incision when coughing by placing a pillow or rolled up towels firmly against it. Once you are able to get out of bed, walk around indoors and cough well. You may stop using the incentive spirometer when instructed by your caregiver.  RISKS AND COMPLICATIONS Take your time so you do not get dizzy or light-headed. If you are in pain, you may need to take or ask for pain medication before doing incentive spirometry. It is harder to take a deep breath if you are having pain. AFTER USE Rest and breathe slowly and easily. It can be helpful to  keep track of a log of your progress. Your caregiver can provide you with a simple table to help with this. If you are using the spirometer at home, follow these instructions: SEEK MEDICAL CARE IF:  You are having difficultly using the spirometer. You have trouble using the spirometer as often as instructed. Your pain medication is not giving enough relief while using the spirometer. You develop fever of 100.5 F (38.1 C) or higher. SEEK IMMEDIATE MEDICAL CARE IF:  You cough up bloody sputum that had not been present before. You develop fever of 102 F (38.9 C) or greater. You develop worsening pain at or near the incision site. MAKE SURE YOU:  Understand these instructions. Will watch your condition. Will get help right away if you are not doing well or get worse. Document Released: 02/14/2007 Document Revised: 12/27/2011 Document Reviewed: 04/17/2007 [facility] Patient Information 2014 [place], [place].   ________________________________________________________________________

## 2024-01-31 ENCOUNTER — Encounter (HOSPITAL_COMMUNITY)
Admission: RE | Admit: 2024-01-31 | Discharge: 2024-01-31 | Disposition: A | Discharge status: Home / Self Care | Source: Ambulatory Visit | Attending: Orthopedic Surgery | Admitting: Orthopedic Surgery

## 2024-01-31 ENCOUNTER — Other Ambulatory Visit: Payer: Self-pay

## 2024-01-31 ENCOUNTER — Encounter (HOSPITAL_COMMUNITY): Payer: Self-pay

## 2024-01-31 VITALS — BP 123/78 | HR 79 | Temp 98.5°F | Ht 69.0 in | Wt 259.0 lb

## 2024-01-31 DIAGNOSIS — Z01818 Encounter for other preprocedural examination: Secondary | ICD-10-CM

## 2024-01-31 DIAGNOSIS — Z794 Long term (current) use of insulin: Secondary | ICD-10-CM | POA: Insufficient documentation

## 2024-01-31 DIAGNOSIS — I129 Hypertensive chronic kidney disease with stage 1 through stage 4 chronic kidney disease, or unspecified chronic kidney disease: Secondary | ICD-10-CM | POA: Diagnosis not present

## 2024-01-31 DIAGNOSIS — G4733 Obstructive sleep apnea (adult) (pediatric): Secondary | ICD-10-CM | POA: Insufficient documentation

## 2024-01-31 DIAGNOSIS — Z7984 Long term (current) use of oral hypoglycemic drugs: Secondary | ICD-10-CM | POA: Insufficient documentation

## 2024-01-31 DIAGNOSIS — E119 Type 2 diabetes mellitus without complications: Secondary | ICD-10-CM

## 2024-01-31 DIAGNOSIS — Z01812 Encounter for preprocedural laboratory examination: Secondary | ICD-10-CM | POA: Insufficient documentation

## 2024-01-31 DIAGNOSIS — Z8546 Personal history of malignant neoplasm of prostate: Secondary | ICD-10-CM | POA: Insufficient documentation

## 2024-01-31 DIAGNOSIS — E1122 Type 2 diabetes mellitus with diabetic chronic kidney disease: Secondary | ICD-10-CM | POA: Insufficient documentation

## 2024-01-31 DIAGNOSIS — M19011 Primary osteoarthritis, right shoulder: Secondary | ICD-10-CM | POA: Diagnosis not present

## 2024-01-31 DIAGNOSIS — I10 Essential (primary) hypertension: Secondary | ICD-10-CM

## 2024-01-31 DIAGNOSIS — Z86718 Personal history of other venous thrombosis and embolism: Secondary | ICD-10-CM | POA: Insufficient documentation

## 2024-01-31 DIAGNOSIS — N183 Chronic kidney disease, stage 3 unspecified: Secondary | ICD-10-CM | POA: Diagnosis not present

## 2024-01-31 LAB — BASIC METABOLIC PANEL WITH GFR
Anion gap: 9 (ref 5–15)
BUN: 32 mg/dL — ABNORMAL HIGH (ref 8–23)
CO2: 26 mmol/L (ref 22–32)
Calcium: 9.2 mg/dL (ref 8.9–10.3)
Chloride: 106 mmol/L (ref 98–111)
Creatinine, Ser: 1.9 mg/dL — ABNORMAL HIGH (ref 0.61–1.24)
GFR, Estimated: 35 mL/min — ABNORMAL LOW (ref >60)
Glucose, Bld: 157 mg/dL — ABNORMAL HIGH (ref 70–99)
Potassium: 4 mmol/L (ref 3.5–5.1)
Sodium: 141 mmol/L (ref 135–145)

## 2024-01-31 LAB — CBC
HCT: 38.8 % — ABNORMAL LOW (ref 39.0–52.0)
Hemoglobin: 12.9 g/dL — ABNORMAL LOW (ref 13.0–17.0)
MCH: 33.2 pg (ref 26.0–34.0)
MCHC: 33.2 g/dL (ref 30.0–36.0)
MCV: 99.7 fL (ref 80.0–100.0)
Platelets: 270 10*3/uL (ref 150–400)
RBC: 3.89 MIL/uL — ABNORMAL LOW (ref 4.22–5.81)
RDW: 14.3 % (ref 11.5–15.5)
WBC: 5.2 10*3/uL (ref 4.0–10.5)
nRBC: 0 % (ref 0.0–0.2)

## 2024-01-31 LAB — SURGICAL PCR SCREEN
MRSA, PCR: NEGATIVE
Staphylococcus aureus: NEGATIVE

## 2024-01-31 LAB — GLUCOSE, CAPILLARY: Glucose-Capillary: 163 mg/dL — ABNORMAL HIGH (ref 70–99)

## 2024-01-31 NOTE — Progress Notes (Addendum)
 For Anesthesia: PCP - Nestor Banter, MD  Cardiologist - Percival Brace, MD  . Holli Lunger: 11/24/23  Bowel Prep reminder:  Chest x-ray -  EKG - 11/02/23 : CEW/Media tab Stress Test - 11/18/23 ECHO - 11/18/23 Cardiac Cath -  Pacemaker/ICD device last checked: Pacemaker orders received: Device Rep notified:  Spinal Cord Stimulator:N/A  Sleep Study - Yes CPAP - Yes  Fasting Blood Sugar - 140's Checks Blood Sugar ___3__ times a day Date and result of last Hgb A1c-7.3: 11/07/23  Last dose of GLP1 agonist- N/A GLP1 instructions:   Last dose of SGLT-2 inhibitors- N/A SGLT-2 instructions:   Blood Thinner Instructions:Eliquis will be hold after: 02/06/24 Aspirin Instructions: Last Dose:  Activity level: Can go up a flight of stairs and activities of daily living without stopping and without chest pain and/or shortness of breath   Able to exercise without chest pain and/or shortness of breath   Unable to go up a flight of stairs without shortness of breath    Anesthesia review: Hx: HTN,DIA,DVT,TIA,OSA(CPAP).  Patient denies shortness of breath, fever, cough and chest pain at PAT appointment   Patient verbalized understanding of instructions that were given to them at the PAT appointment. Patient was also instructed that they will need to review over the PAT instructions again at home before surgery.

## 2024-02-01 LAB — HEMOGLOBIN A1C
Hgb A1c MFr Bld: 7.5 % — ABNORMAL HIGH (ref 4.8–5.6)
Mean Plasma Glucose: 169 mg/dL

## 2024-02-01 NOTE — Progress Notes (Signed)
 Lab. Results:creatinine: 1.90

## 2024-02-02 NOTE — Anesthesia Preprocedure Evaluation (Addendum)
 Anesthesia Evaluation  Patient identified by MRN, date of birth, ID band Patient awake    Reviewed: Allergy & Precautions, NPO status , Patient's Chart, lab work & pertinent test results, reviewed documented beta blocker date and time   History of Anesthesia Complications Negative for: history of anesthetic complications  Airway Mallampati: II  TM Distance: >3 FB Neck ROM: Full    Dental  (+) Dental Advisory Given, Teeth Intact   Pulmonary sleep apnea and Continuous Positive Airway Pressure Ventilation    Pulmonary exam normal        Cardiovascular hypertension, Pt. on medications and Pt. on home beta blockers + DVT  Normal cardiovascular exam   '25 Stress Echo - NORMAL STRESS ECHOCARDIOGRAM. NORMAL RESTING STUDY WITH NO WALL MOTION ABNORMALITIES AT REST AND PEAK EXERCISE. Maximum workload of 4.2 METs was achieved during exercise. Trivial MR, TR, and PR.     Neuro/Psych  Hearing aids  TIA Neuromuscular disease  negative psych ROS   GI/Hepatic Neg liver ROS, hiatal hernia,GERD  Medicated and Controlled,,  Endo/Other  diabetes, Type 2, Oral Hypoglycemic Agents, Insulin Dependent   Obesity   Renal/GU Renal InsufficiencyRenal disease    Prostate cancer     Musculoskeletal  (+) Arthritis ,    Abdominal   Peds  Hematology  (+) Blood dyscrasia, anemia   Anesthesia Other Findings   Reproductive/Obstetrics                             Anesthesia Physical Anesthesia Plan  ASA: 3  Anesthesia Plan: General   Post-op Pain Management: Regional block* and Tylenol  PO (pre-op)*   Induction: Intravenous  PONV Risk Score and Plan: 2 and Treatment may vary due to age or medical condition, Ondansetron  and Propofol  infusion  Airway Management Planned: Oral ETT  Additional Equipment: None  Intra-op Plan:   Post-operative Plan: Extubation in OR  Informed Consent: I have reviewed the  patients History and Physical, chart, labs and discussed the procedure including the risks, benefits and alternatives for the proposed anesthesia with the patient or authorized representative who has indicated his/her understanding and acceptance.     Dental advisory given  Plan Discussed with: CRNA and Anesthesiologist  Anesthesia Plan Comments: (See PAT note)       Anesthesia Quick Evaluation

## 2024-02-02 NOTE — Progress Notes (Signed)
 Anesthesia Chart Review   Case: [id number] Date/Time: 02/10/24 0715   Procedure: ARTHROPLASTY, SHOULDER, TOTAL, REVERSE (Right: Shoulder)   Anesthesia type: Choice   Diagnosis: Other specified joint disorders, right shoulder [M25.811]   Pre-op diagnosis: Right shoulder osteoarthritis   Location: [facility] ROOM 08 / [facility]   Surgeons: Yolonda Kida, MD       DISCUSSION:77 y.o. never smoker with h/o HTN, OSA, CKD Stage III, DM II, DVT, prostate cancer, right shoulder OA scheduled for above procedure 02/10/2024 with Dr. Duwayne Heck.   Pt reports last dose of Eliquis 02/06/2024.   Per cardiology note 11/24/2023, "Stress echocardiogram 11/18/2023 revealed normal left ventricular function, without evidence for scar or ischemia. The patient should be at low risk for serious cardiovascular complication during shoulder surgery. The patient has essential hypertension, blood pressure well-controlled on current BP medications. "  Stress Echo 11/18/2023 (Care Everywhere) INTERPRETATION ----------------------------------------------------------------------- NORMAL STRESS ECHOCARDIOGRAM. NORMAL RESTING STUDY WITH NO WALL MOTION ABNORMALITIES AT REST AND PEAK EXERCISE. Maximum workload of 4.2 METs was achieved during exercise.  RESTING COMMENTS --------------------------------------------------------------------- DIASTOLIC FUNCTION NOT ASSESSED VALVULAR REGURGITATION: No AR, TRIVIAL MR, TRIVIAL PR, TRIVIAL TR NO VALVULAR STENOSIS  VS: BP 123/78   Pulse 79   Temp 36.9 C (Oral)   Ht 5\' 9"  (1.753 m)   Wt 117.5 kg   SpO2 95%   BMI 38.25 kg/m   PROVIDERS: Kandyce Rud, MD is PCP   Cardiologist - Marcina Millard, MD  LABS: Labs reviewed: Acceptable for surgery. (all labs ordered are listed, but only abnormal results are displayed)  Labs Reviewed  HEMOGLOBIN A1C - Abnormal; Notable for the following components:      Result Value   Hgb A1c MFr Bld 7.5 (*)    All other components within  normal limits  BASIC METABOLIC PANEL WITH GFR - Abnormal; Notable for the following components:   Glucose, Bld 157 (*)    BUN 32 (*)    Creatinine, Ser 1.90 (*)    GFR, Estimated 35 (*)    All other components within normal limits  CBC - Abnormal; Notable for the following components:   RBC 3.89 (*)    Hemoglobin 12.9 (*)    HCT 38.8 (*)    All other components within normal limits  GLUCOSE, CAPILLARY - Abnormal; Notable for the following components:   Glucose-Capillary 163 (*)    All other components within normal limits  SURGICAL PCR SCREEN     IMAGES:   EKG:   CV:  Past Medical History:  Diagnosis Date   Actinic keratosis    Anticoagulant long-term use    ELIQUIS   Arthritis    Basal cell carcinoma 01/27/2010   Right spinal upper back. Superficial.    Basal cell carcinoma 11/15/2019   Left inferior antihelix post. to tragus. Superficial and nodular patterns. Excised: 11/27/2019   Basal cell carcinoma 11/15/2019   Left mid lateral nose. Nodular and infiltrative patterns. Excised: 11/27/2019   Basal cell carcinoma 11/27/2019   Left mid superior helix. BCC with focal sclerosis.   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 Slight atypia. Edges free.   Dysplastic nevus 06/30/2023   R flank, moderate atypia   ED (erectile dysfunction)    GERD (gastroesophageal reflux disease)    Hiatal hernia  History of DVT of lower extremity    POST OP KNEE SURGERY   History of transient ischemic attack (TIA)    PER PT HX  ?POSSIBLE-- PT ON ELIQUIS   Hyperlipidemia    Hypertension    Hypogonadism male    OSA (obstructive sleep apnea)    per pt positive sleep study yrs ago recommended CPAP --  pt Non- compliant -- states uses Breath Rite Nasal Strips   Peripheral neuropathy    Prostate cancer [facility]) urologist-  dr  Patsi Sears  oncologist-- dr Kathrynn Running   dx 03-25-2015  T1c, Gleason 4+3  s/p  cryoablation prostate 06-02-2015/  external  radiation therapy 08-16-2016 to 09-20-2016/  scheduled for radioactive seed implants 10-22-2016   TIA (transient ischemic attack)    ?? 08/20/2014? Eliquis 07/02/15   Type 2 diabetes mellitus (HCC)    followed by dr Kandyce Rud [facility])  last A1c 8.4 on 07-27-2016 per lov note   Umbilical hernia    Wears glasses    Wears hearing aid    bilateral    Past Surgical History:  Procedure Laterality Date   COLONOSCOPY WITH PROPOFOL N/A 05/30/2017   Procedure: COLONOSCOPY WITH PROPOFOL;  Surgeon: Scot Jun, MD;  Location: [facility] ENDOSCOPY;  Service: Endoscopy;  Laterality: N/A;   COLONOSCOPY WITH PROPOFOL N/A 12/28/2021   Procedure: COLONOSCOPY WITH PROPOFOL;  Surgeon: Jaynie Collins, DO;  Location: [facility] ENDOSCOPY;  Service: Gastroenterology;  Laterality: N/A;   CRYOABLATION N/A 06/02/2015   Procedure: CRYO ABLATION PROSTATE;  Surgeon: Jethro Bolus, MD;  Location: [facility];  Service: Urology;  Laterality: N/A;   CYSTOSCOPY WITH URETHRAL DILATATION N/A 05/04/2019   Procedure: CYSTOSCOPY WITH URETHRAL DILATATION;  Surgeon: Sebastian Ache, MD;  Location: [facility];  Service: Urology;  Laterality: N/A;   ELBOW BURSA SURGERY Left Feb 1997   prostate biopsies     x12, cryotherapy   RADIOACTIVE SEED IMPLANT N/A 10/22/2016   Procedure: RADIOACTIVE SEED IMPLANT/BRACHYTHERAPY IMPLANT, URETHRAL STRICTURE DIALIATION;  Surgeon: Jethro Bolus, MD;  Location: [facility];  Service: Urology;  Laterality: N/A;   SHOULDER OPEN ROTATOR CUFF REPAIR Left 09-04-2014   skin cancer L ear     Dr Gwen Pounds    TOTAL KNEE ARTHROPLASTY Bilateral right 08-13-2008  &  left 11-14-2008   UMBILICAL HERNIA REPAIR  06-23-2012   ureter stretch  05/02/2019   [facility] Dr Berneice Heinrich    MEDICATIONS:  ACCU-CHEK FASTCLIX LANCETS  MISC   amLODipine (NORVASC) 5 MG tablet   apixaban (ELIQUIS) 5 MG TABS tablet   Ascorbic Acid (VITAMIN C) 1000 MG tablet   bisoprolol (ZEBETA) 5 MG tablet   cyanocobalamin (VITAMIN B12) 1000 MCG tablet   EPINEPHrine 0.3 mg/0.3 mL IJ SOAJ injection   fluorouracil (EFUDEX) 5 % cream   furosemide (LASIX) 40 MG tablet   glimepiride (AMARYL) 2 MG tablet   insulin glargine-yfgn (SEMGLEE) 100 UNIT/ML injection   KRILL OIL PO   lisinopril (ZESTRIL) 20 MG tablet   metFORMIN (GLUCOPHAGE) 500 MG tablet   NOVOLOG FLEXPEN 100 UNIT/ML FlexPen   omeprazole (PRILOSEC) 40 MG capsule   OVER THE COUNTER MEDICATION   OVER THE COUNTER MEDICATION   pioglitazone (ACTOS) 45 MG tablet   Red Yeast Rice 600 MG CAPS   Testosterone 20.25 MG/ACT (1.62%) GEL   No current facility-administered medications for this encounter.    acetaminophen (TYLENOL) tablet 975 mg     [facility], PA-C [facility] Pre-Surgical Testing [phone]

## 2024-02-10 ENCOUNTER — Encounter (HOSPITAL_COMMUNITY)
Admission: RE | Disposition: A | Discharge status: Home / Self Care | Payer: Self-pay | Source: Ambulatory Visit | Attending: Orthopedic Surgery

## 2024-02-10 ENCOUNTER — Inpatient Hospital Stay (HOSPITAL_COMMUNITY)
Admission: RE | Admit: 2024-02-10 | Discharge: 2024-02-13 | DRG: 483 | Disposition: A | Discharge status: Home / Self Care | Source: Ambulatory Visit | Attending: Orthopedic Surgery | Admitting: Orthopedic Surgery

## 2024-02-10 ENCOUNTER — Encounter (HOSPITAL_COMMUNITY): Payer: Self-pay | Admitting: Orthopedic Surgery

## 2024-02-10 ENCOUNTER — Other Ambulatory Visit: Payer: Self-pay

## 2024-02-10 ENCOUNTER — Ambulatory Visit (HOSPITAL_COMMUNITY): Admitting: Physician Assistant

## 2024-02-10 ENCOUNTER — Ambulatory Visit (HOSPITAL_COMMUNITY)

## 2024-02-10 ENCOUNTER — Ambulatory Visit (HOSPITAL_COMMUNITY): Admitting: Anesthesiology

## 2024-02-10 DIAGNOSIS — I82409 Acute embolism and thrombosis of unspecified deep veins of unspecified lower extremity: Secondary | ICD-10-CM | POA: Insufficient documentation

## 2024-02-10 DIAGNOSIS — E669 Obesity, unspecified: Secondary | ICD-10-CM | POA: Diagnosis present

## 2024-02-10 DIAGNOSIS — G4733 Obstructive sleep apnea (adult) (pediatric): Secondary | ICD-10-CM | POA: Diagnosis present

## 2024-02-10 DIAGNOSIS — Z7984 Long term (current) use of oral hypoglycemic drugs: Secondary | ICD-10-CM

## 2024-02-10 DIAGNOSIS — Z794 Long term (current) use of insulin: Secondary | ICD-10-CM | POA: Diagnosis not present

## 2024-02-10 DIAGNOSIS — E1165 Type 2 diabetes mellitus with hyperglycemia: Secondary | ICD-10-CM | POA: Diagnosis present

## 2024-02-10 DIAGNOSIS — E119 Type 2 diabetes mellitus without complications: Secondary | ICD-10-CM

## 2024-02-10 DIAGNOSIS — Z96653 Presence of artificial knee joint, bilateral: Secondary | ICD-10-CM | POA: Diagnosis present

## 2024-02-10 DIAGNOSIS — I10 Essential (primary) hypertension: Secondary | ICD-10-CM | POA: Diagnosis present

## 2024-02-10 DIAGNOSIS — Z86718 Personal history of other venous thrombosis and embolism: Secondary | ICD-10-CM

## 2024-02-10 DIAGNOSIS — N179 Acute kidney failure, unspecified: Secondary | ICD-10-CM | POA: Insufficient documentation

## 2024-02-10 DIAGNOSIS — Z79899 Other long term (current) drug therapy: Secondary | ICD-10-CM

## 2024-02-10 DIAGNOSIS — Z91030 Bee allergy status: Secondary | ICD-10-CM

## 2024-02-10 DIAGNOSIS — Z8546 Personal history of malignant neoplasm of prostate: Secondary | ICD-10-CM

## 2024-02-10 DIAGNOSIS — K219 Gastro-esophageal reflux disease without esophagitis: Secondary | ICD-10-CM | POA: Diagnosis present

## 2024-02-10 DIAGNOSIS — M19011 Primary osteoarthritis, right shoulder: Secondary | ICD-10-CM

## 2024-02-10 DIAGNOSIS — E785 Hyperlipidemia, unspecified: Secondary | ICD-10-CM | POA: Diagnosis present

## 2024-02-10 DIAGNOSIS — Z85828 Personal history of other malignant neoplasm of skin: Secondary | ICD-10-CM

## 2024-02-10 DIAGNOSIS — E1142 Type 2 diabetes mellitus with diabetic polyneuropathy: Secondary | ICD-10-CM | POA: Diagnosis present

## 2024-02-10 DIAGNOSIS — Z888 Allergy status to other drugs, medicaments and biological substances status: Secondary | ICD-10-CM

## 2024-02-10 DIAGNOSIS — N400 Enlarged prostate without lower urinary tract symptoms: Secondary | ICD-10-CM | POA: Diagnosis present

## 2024-02-10 DIAGNOSIS — Z7901 Long term (current) use of anticoagulants: Secondary | ICD-10-CM

## 2024-02-10 DIAGNOSIS — Z6838 Body mass index (BMI) 38.0-38.9, adult: Secondary | ICD-10-CM

## 2024-02-10 DIAGNOSIS — Z923 Personal history of irradiation: Secondary | ICD-10-CM

## 2024-02-10 DIAGNOSIS — Z96611 Presence of right artificial shoulder joint: Principal | ICD-10-CM

## 2024-02-10 DIAGNOSIS — M75101 Unspecified rotator cuff tear or rupture of right shoulder, not specified as traumatic: Secondary | ICD-10-CM | POA: Diagnosis present

## 2024-02-10 DIAGNOSIS — Z8673 Personal history of transient ischemic attack (TIA), and cerebral infarction without residual deficits: Secondary | ICD-10-CM

## 2024-02-10 HISTORY — PX: REVERSE SHOULDER ARTHROPLASTY: SHX5054

## 2024-02-10 LAB — GLUCOSE, CAPILLARY
Glucose-Capillary: 147 mg/dL — ABNORMAL HIGH (ref 70–99)
Glucose-Capillary: 96 mg/dL (ref 70–99)
Glucose-Capillary: 129 mg/dL — ABNORMAL HIGH (ref 70–99)
Glucose-Capillary: 196 mg/dL — ABNORMAL HIGH (ref 70–99)

## 2024-02-10 SURGERY — ARTHROPLASTY, SHOULDER, TOTAL, REVERSE
Anesthesia: General | Site: Shoulder | Laterality: Right

## 2024-02-10 MED ORDER — HYDROCODONE-ACETAMINOPHEN 7.5-325 MG PO TABS
1.0000 | ORAL_TABLET | ORAL | Status: DC | PRN
  Administered 2024-02-11: 1 via ORAL
  Administered 2024-02-11: 2 via ORAL
  Filled 2024-02-10: qty 1
  Filled 2024-02-10: qty 2

## 2024-02-10 MED ORDER — PROPOFOL 10 MG/ML IV BOLUS
INTRAVENOUS | Status: DC | PRN
  Administered 2024-02-10: 140 mg via INTRAVENOUS

## 2024-02-10 MED ORDER — ONDANSETRON HCL 4 MG/2ML IJ SOLN: 4.0000 mg | Freq: Four times a day (QID) | INTRAMUSCULAR | Status: DC | PRN

## 2024-02-10 MED ORDER — CEFAZOLIN SODIUM-DEXTROSE 2-4 GM/100ML-% IV SOLN
2.0000 g | INTRAVENOUS | Status: AC
Start: 2024-02-10 — End: 2024-02-10
  Administered 2024-02-10: 2 g via INTRAVENOUS
  Filled 2024-02-10: qty 100

## 2024-02-10 MED ORDER — METOCLOPRAMIDE HCL 5 MG/ML IJ SOLN: 5.0000 mg | Freq: Three times a day (TID) | INTRAMUSCULAR | Status: DC | PRN

## 2024-02-10 MED ORDER — HYDROCODONE-ACETAMINOPHEN 5-325 MG PO TABS
1.0000 | ORAL_TABLET | ORAL | Status: DC | PRN
  Administered 2024-02-11 – 2024-02-12 (×3): 2 via ORAL
  Filled 2024-02-10 (×3): qty 2

## 2024-02-10 MED ORDER — DOCUSATE SODIUM 100 MG PO CAPS
100.0000 mg | ORAL_CAPSULE | Freq: Two times a day (BID) | ORAL | Status: DC
  Administered 2024-02-10 – 2024-02-13 (×7): 100 mg via ORAL
  Filled 2024-02-10 (×7): qty 1

## 2024-02-10 MED ORDER — PROPOFOL 10 MG/ML IV BOLUS
INTRAVENOUS | Status: AC
  Filled 2024-02-10: qty 20

## 2024-02-10 MED ORDER — MORPHINE SULFATE (PF) 2 MG/ML IV SOLN: 0.5000 mg | INTRAVENOUS | Status: DC | PRN

## 2024-02-10 MED ORDER — LIDOCAINE HCL (PF) 2 % IJ SOLN
INTRAMUSCULAR | Status: AC
  Filled 2024-02-10: qty 5

## 2024-02-10 MED ORDER — ORAL CARE MOUTH RINSE: 15.0000 mL | Freq: Once | OROMUCOSAL | Status: AC

## 2024-02-10 MED ORDER — DEXAMETHASONE SODIUM PHOSPHATE 10 MG/ML IJ SOLN
INTRAMUSCULAR | Status: DC | PRN
  Administered 2024-02-10: 10 mg via INTRAVENOUS

## 2024-02-10 MED ORDER — OXYCODONE HCL 5 MG PO TABS: 5.0000 mg | ORAL_TABLET | Freq: Four times a day (QID) | ORAL | 0 refills | Status: DC | PRN

## 2024-02-10 MED ORDER — PHENOL 1.4 % MT LIQD: 1.0000 | OROMUCOSAL | Status: DC | PRN

## 2024-02-10 MED ORDER — ACETAMINOPHEN 500 MG PO TABS
500.0000 mg | ORAL_TABLET | Freq: Four times a day (QID) | ORAL | Status: AC
Start: 2024-02-10 — End: 2024-02-11
  Administered 2024-02-10 – 2024-02-11 (×4): 500 mg via ORAL
  Filled 2024-02-10 (×4): qty 1

## 2024-02-10 MED ORDER — LIDOCAINE HCL (CARDIAC) PF 100 MG/5ML IV SOSY
PREFILLED_SYRINGE | INTRAVENOUS | Status: DC | PRN
  Administered 2024-02-10: 40 mg via INTRAVENOUS

## 2024-02-10 MED ORDER — ACETAMINOPHEN 500 MG PO TABS
1000.0000 mg | ORAL_TABLET | Freq: Once | ORAL | Status: AC
  Administered 2024-02-10: 1000 mg via ORAL
  Filled 2024-02-10: qty 2

## 2024-02-10 MED ORDER — MENTHOL 3 MG MT LOZG: 1.0000 | LOZENGE | OROMUCOSAL | Status: DC | PRN

## 2024-02-10 MED ORDER — ONDANSETRON HCL 4 MG/2ML IJ SOLN
INTRAMUSCULAR | Status: DC | PRN
  Administered 2024-02-10: 4 mg via INTRAVENOUS

## 2024-02-10 MED ORDER — TRANEXAMIC ACID-NACL 1000-0.7 MG/100ML-% IV SOLN
1000.0000 mg | Freq: Once | INTRAVENOUS | Status: AC
  Administered 2024-02-10: 1000 mg via INTRAVENOUS
  Filled 2024-02-10: qty 100

## 2024-02-10 MED ORDER — 0.9 % SODIUM CHLORIDE (POUR BTL) OPTIME
TOPICAL | Status: DC | PRN
  Administered 2024-02-10: 1000 mL

## 2024-02-10 MED ORDER — CHLORHEXIDINE GLUCONATE 0.12 % MT SOLN
15.0000 mL | Freq: Once | OROMUCOSAL | Status: AC
  Administered 2024-02-10: 15 mL via OROMUCOSAL

## 2024-02-10 MED ORDER — BISOPROLOL FUMARATE 5 MG PO TABS
5.0000 mg | ORAL_TABLET | Freq: Every evening | ORAL | Status: DC
  Administered 2024-02-11 – 2024-02-12 (×2): 5 mg via ORAL
  Filled 2024-02-10 (×2): qty 1

## 2024-02-10 MED ORDER — ONDANSETRON HCL 4 MG/2ML IJ SOLN: 4.0000 mg | Freq: Once | INTRAMUSCULAR | Status: DC | PRN

## 2024-02-10 MED ORDER — FENTANYL CITRATE (PF) 100 MCG/2ML IJ SOLN
INTRAMUSCULAR | Status: DC | PRN
  Administered 2024-02-10: 50 ug via INTRAVENOUS
  Administered 2024-02-10: 25 ug via INTRAVENOUS

## 2024-02-10 MED ORDER — APIXABAN 5 MG PO TABS
5.0000 mg | ORAL_TABLET | Freq: Two times a day (BID) | ORAL | Status: DC
  Administered 2024-02-11 – 2024-02-13 (×5): 5 mg via ORAL
  Filled 2024-02-10 (×5): qty 1

## 2024-02-10 MED ORDER — LACTATED RINGERS IV SOLN: INTRAVENOUS | Status: DC

## 2024-02-10 MED ORDER — FENTANYL CITRATE PF 50 MCG/ML IJ SOSY: 25.0000 ug | PREFILLED_SYRINGE | INTRAMUSCULAR | Status: DC | PRN

## 2024-02-10 MED ORDER — VANCOMYCIN HCL 1000 MG IV SOLR
INTRAVENOUS | Status: AC
  Filled 2024-02-10: qty 20

## 2024-02-10 MED ORDER — STERILE WATER FOR IRRIGATION IR SOLN
Status: DC | PRN
  Administered 2024-02-10: 1000 mL

## 2024-02-10 MED ORDER — ONDANSETRON HCL 4 MG PO TABS: 4.0000 mg | ORAL_TABLET | Freq: Four times a day (QID) | ORAL | Status: DC | PRN

## 2024-02-10 MED ORDER — SUGAMMADEX SODIUM 200 MG/2ML IV SOLN
INTRAVENOUS | Status: DC | PRN
  Administered 2024-02-10 (×2): 200 mg via INTRAVENOUS

## 2024-02-10 MED ORDER — METOCLOPRAMIDE HCL 5 MG PO TABS: 5.0000 mg | ORAL_TABLET | Freq: Three times a day (TID) | ORAL | Status: DC | PRN

## 2024-02-10 MED ORDER — EPHEDRINE SULFATE-NACL 50-0.9 MG/10ML-% IV SOSY
PREFILLED_SYRINGE | INTRAVENOUS | Status: DC | PRN
  Administered 2024-02-10 (×2): 5 mg via INTRAVENOUS

## 2024-02-10 MED ORDER — AMLODIPINE BESYLATE 5 MG PO TABS
5.0000 mg | ORAL_TABLET | Freq: Every evening | ORAL | Status: DC
  Administered 2024-02-11 – 2024-02-12 (×2): 5 mg via ORAL
  Filled 2024-02-10 (×2): qty 1

## 2024-02-10 MED ORDER — BUPIVACAINE LIPOSOME 1.3 % IJ SUSP
INTRAMUSCULAR | Status: DC | PRN
  Administered 2024-02-10: 10 mL via PERINEURAL

## 2024-02-10 MED ORDER — ROCURONIUM BROMIDE 100 MG/10ML IV SOLN
INTRAVENOUS | Status: DC | PRN
  Administered 2024-02-10: 50 mg via INTRAVENOUS

## 2024-02-10 MED ORDER — EPHEDRINE SULFATE (PRESSORS) 50 MG/ML IJ SOLN: INTRAMUSCULAR | Status: DC | PRN

## 2024-02-10 MED ORDER — FENTANYL CITRATE (PF) 100 MCG/2ML IJ SOLN
INTRAMUSCULAR | Status: AC
Start: 2024-02-10 — End: ?
  Filled 2024-02-10: qty 2

## 2024-02-10 MED ORDER — ONDANSETRON HCL 4 MG/2ML IJ SOLN
INTRAMUSCULAR | Status: AC
  Filled 2024-02-10: qty 2

## 2024-02-10 MED ORDER — PANTOPRAZOLE SODIUM 40 MG PO TBEC
40.0000 mg | DELAYED_RELEASE_TABLET | Freq: Every day | ORAL | Status: DC
  Administered 2024-02-11 – 2024-02-13 (×3): 40 mg via ORAL
  Filled 2024-02-10 (×3): qty 1

## 2024-02-10 MED ORDER — METFORMIN HCL 500 MG PO TABS
500.0000 mg | ORAL_TABLET | Freq: Two times a day (BID) | ORAL | Status: DC
  Administered 2024-02-10 (×2): 500 mg via ORAL
  Filled 2024-02-10 (×2): qty 1

## 2024-02-10 MED ORDER — BUPIVACAINE HCL (PF) 0.5 % IJ SOLN
INTRAMUSCULAR | Status: DC | PRN
Start: 2024-02-10 — End: 2024-02-10
  Administered 2024-02-10: 15 mL via PERINEURAL

## 2024-02-10 MED ORDER — ACETAMINOPHEN 325 MG PO TABS
325.0000 mg | ORAL_TABLET | Freq: Four times a day (QID) | ORAL | Status: DC | PRN
  Administered 2024-02-12 – 2024-02-13 (×2): 650 mg via ORAL
  Filled 2024-02-10 (×2): qty 2

## 2024-02-10 MED ORDER — PHENYLEPHRINE HCL-NACL 20-0.9 MG/250ML-% IV SOLN
INTRAVENOUS | Status: AC
  Filled 2024-02-10: qty 250

## 2024-02-10 MED ORDER — LISINOPRIL 20 MG PO TABS
20.0000 mg | ORAL_TABLET | Freq: Two times a day (BID) | ORAL | Status: DC
  Administered 2024-02-10 – 2024-02-12 (×4): 20 mg via ORAL
  Filled 2024-02-10 (×4): qty 1

## 2024-02-10 MED ORDER — PHENYLEPHRINE 80 MCG/ML (10ML) SYRINGE FOR IV PUSH (FOR BLOOD PRESSURE SUPPORT)
PREFILLED_SYRINGE | INTRAVENOUS | Status: DC | PRN
  Administered 2024-02-10: 160 ug via INTRAVENOUS

## 2024-02-10 MED ORDER — ONDANSETRON HCL 4 MG PO TABS: 4.0000 mg | ORAL_TABLET | Freq: Three times a day (TID) | ORAL | 0 refills | Status: DC | PRN

## 2024-02-10 MED ORDER — PHENYLEPHRINE HCL-NACL 20-0.9 MG/250ML-% IV SOLN
INTRAVENOUS | Status: DC | PRN
Start: 2024-02-10 — End: 2024-02-10
  Administered 2024-02-10: 40 ug/min via INTRAVENOUS

## 2024-02-10 MED ORDER — ISOPROPYL ALCOHOL 70 % SOLN
Status: DC | PRN
  Administered 2024-02-10: 1 via TOPICAL

## 2024-02-10 MED ORDER — OXYCODONE HCL 5 MG/5ML PO SOLN: 5.0000 mg | Freq: Once | ORAL | Status: AC | PRN

## 2024-02-10 MED ORDER — GLIMEPIRIDE 2 MG PO TABS
2.0000 mg | ORAL_TABLET | Freq: Two times a day (BID) | ORAL | Status: DC
  Administered 2024-02-10 (×2): 2 mg via ORAL
  Filled 2024-02-10 (×3): qty 1

## 2024-02-10 MED ORDER — INSULIN ASPART 100 UNIT/ML IJ SOLN: 0.0000 [IU] | INTRAMUSCULAR | Status: DC | PRN

## 2024-02-10 MED ORDER — OXYCODONE HCL 5 MG PO TABS: 5.0000 mg | ORAL_TABLET | Freq: Once | ORAL | Status: AC | PRN

## 2024-02-10 MED ORDER — TRANEXAMIC ACID-NACL 1000-0.7 MG/100ML-% IV SOLN
1000.0000 mg | INTRAVENOUS | Status: AC
  Administered 2024-02-10: 1000 mg via INTRAVENOUS
  Filled 2024-02-10: qty 100

## 2024-02-10 MED ORDER — VANCOMYCIN HCL 1000 MG IV SOLR
INTRAVENOUS | Status: DC | PRN
  Administered 2024-02-10: 1000 mg

## 2024-02-10 MED ORDER — DEXAMETHASONE SODIUM PHOSPHATE 10 MG/ML IJ SOLN
INTRAMUSCULAR | Status: AC
Start: 2024-02-10 — End: ?
  Filled 2024-02-10: qty 1

## 2024-02-10 MED ORDER — FUROSEMIDE 40 MG PO TABS
40.0000 mg | ORAL_TABLET | Freq: Every day | ORAL | Status: DC
  Administered 2024-02-10 – 2024-02-12 (×3): 40 mg via ORAL
  Filled 2024-02-10 (×3): qty 1

## 2024-02-10 MED ORDER — OXYCODONE HCL 5 MG PO TABS
ORAL_TABLET | ORAL | Status: AC
  Administered 2024-02-10: 5 mg via ORAL
  Filled 2024-02-10: qty 1

## 2024-02-10 MED ORDER — ROCURONIUM BROMIDE 10 MG/ML (PF) SYRINGE
PREFILLED_SYRINGE | INTRAVENOUS | Status: AC
  Filled 2024-02-10: qty 10

## 2024-02-10 SURGICAL SUPPLY — 57 items
AUGMENT COMP REV MI TAPR ADPTR (Joint) IMPLANT
BAG COUNTER SPONGE SURGICOUNT (BAG) IMPLANT
BAG ZIPLOCK 12X15 (MISCELLANEOUS) ×1 IMPLANT
BEARING HUMERAL SHLDER 36M STD (Shoulder) IMPLANT
BIT DRILL TWIST 2.7 (BIT) IMPLANT
BLADE SAG 18X100X1.27 (BLADE) ×1 IMPLANT
CEMENT BONE DEPUY (Cement) IMPLANT
CLSR STERI-STRIP ANTIMIC 1/2X4 (GAUZE/BANDAGES/DRESSINGS) IMPLANT
COMP HUM UNI IDENTI -6 EXT (Joint) IMPLANT
COOLER ICEMAN CLASSIC (MISCELLANEOUS) ×1 IMPLANT
COVER BACK TABLE 60X90IN (DRAPES) ×1 IMPLANT
COVER SURGICAL LIGHT HANDLE (MISCELLANEOUS) ×1 IMPLANT
DRAPE POUCH INSTRU U-SHP 10X18 (DRAPES) ×1 IMPLANT
DRAPE SHEET LG 3/4 BI-LAMINATE (DRAPES) ×1 IMPLANT
DRAPE SURG 17X11 SM STRL (DRAPES) ×1 IMPLANT
DRAPE SURG ORHT 6 SPLT 77X108 (DRAPES) ×2 IMPLANT
DRAPE TOP 10253 STERILE (DRAPES) ×1 IMPLANT
DRAPE U-SHAPE 47X51 STRL (DRAPES) ×1 IMPLANT
DRESSING AQUACEL AG SP 3.5X10 (GAUZE/BANDAGES/DRESSINGS) IMPLANT
DRSG AQUACEL AG ADV 3.5X 6 (GAUZE/BANDAGES/DRESSINGS) IMPLANT
DRSG AQUACEL AG ADV 3.5X10 (GAUZE/BANDAGES/DRESSINGS) IMPLANT
DURAPREP 26ML APPLICATOR (WOUND CARE) ×1 IMPLANT
ELECT PENCIL ROCKER SW 15FT (MISCELLANEOUS) ×1 IMPLANT
ELECT REM PT RETURN 15FT ADLT (MISCELLANEOUS) ×1 IMPLANT
FACESHIELD WRAPAROUND (MASK) ×1 IMPLANT
FACESHIELD WRAPAROUND OR TEAM (MASK) ×1 IMPLANT
GLENOID SPHERE 36MM CVD +3 (Orthopedic Implant) IMPLANT
GLOVE BIO SURGEON STRL SZ7.5 (GLOVE) ×4 IMPLANT
GLOVE BIOGEL PI IND STRL 8 (GLOVE) ×2 IMPLANT
GOWN STRL REUS W/ TWL XL LVL3 (GOWN DISPOSABLE) ×2 IMPLANT
GUIDE BONE RSA SHLD ROT RT (ORTHOPEDIC DISPOSABLE SUPPLIES) IMPLANT
KIT BASIN OR (CUSTOM PROCEDURE TRAY) ×1 IMPLANT
KIT TURNOVER KIT A (KITS) ×1 IMPLANT
MANIFOLD NEPTUNE II (INSTRUMENTS) ×1 IMPLANT
NDL TAPERED W/ NITINOL LOOP (MISCELLANEOUS) IMPLANT
NEEDLE TAPERED W/ NITINOL LOOP (MISCELLANEOUS) IMPLANT
NS IRRIG 1000ML POUR BTL (IV SOLUTION) ×1 IMPLANT
PACK SHOULDER (CUSTOM PROCEDURE TRAY) ×1 IMPLANT
PAD COLD SHLDR WRAP-ON (PAD) ×1 IMPLANT
PIN THREADED REVERSE (PIN) IMPLANT
RESTRAINT HEAD UNIVERSAL NS (MISCELLANEOUS) ×1 IMPLANT
SCREW BONE LOCKING 4.75X30X3.5 (Screw) IMPLANT
SCREW BONE STRL 6.5MMX25MM (Screw) IMPLANT
SCREW LOCKING 4.75MMX15MM (Screw) IMPLANT
SLING ARM IMMOBILIZER LRG (SOFTGOODS) IMPLANT
SLING ARM IMMOBILIZER MED (SOFTGOODS) ×1 IMPLANT
STEM HUM IDENTITY STD 12 (Stem) IMPLANT
STRIP CLOSURE SKIN 1/2X4 (GAUZE/BANDAGES/DRESSINGS) ×1 IMPLANT
SUT MNCRL AB 3-0 PS2 18 (SUTURE) ×1 IMPLANT
SUT MON AB 2-0 CT1 36 (SUTURE) ×1 IMPLANT
SUT VIC AB 0 CT1 36 (SUTURE) ×1 IMPLANT
SUT VIC AB 1 CT1 36 (SUTURE) ×1 IMPLANT
SUTURE FIBERWR #2 38 T-5 BLUE (SUTURE) IMPLANT
SUTURE TAPE 1.3 40 TPR END (SUTURE) ×1 IMPLANT
TOWEL OR 17X26 10 PK STRL BLUE (TOWEL DISPOSABLE) ×1 IMPLANT
TOWER SMARTMIX MINI (MISCELLANEOUS) IMPLANT
TUBE SUCTION HIGH CAP CLEAR NV (SUCTIONS) ×1 IMPLANT

## 2024-02-10 NOTE — Anesthesia Postprocedure Evaluation (Signed)
 Anesthesia Post Note  Patient: Jeremiah Zhang  Procedure(s) Performed: ARTHROPLASTY, SHOULDER, TOTAL, REVERSE (Right: Shoulder)     Patient location during evaluation: PACU Anesthesia Type: General Level of consciousness: awake and alert Pain management: pain level controlled Vital Signs Assessment: post-procedure vital signs reviewed and stable Respiratory status: spontaneous breathing, nonlabored ventilation and respiratory function stable Cardiovascular status: stable and blood pressure returned to baseline Anesthetic complications: no   No notable events documented.  Last Vitals:  Vitals:   02/10/24 1000 02/10/24 1015  BP: (!) 103/54 102/66  Pulse: (!) 58 (!) 57  Resp: 19 19  Temp:    SpO2: 92% 93%    Last Pain:  Vitals:   02/10/24 1000  TempSrc:   PainSc: Asleep                 [name]

## 2024-02-10 NOTE — Progress Notes (Signed)
 Pt is currently on 3L [place] at this time and is tolerating well. Spoke with pt about using our CPAP machine while here at hospital and pt declined. Pt states he barely uses his at home and would not benefit from it here. No distress is noted with pt. Will continue to monitor.

## 2024-02-10 NOTE — Anesthesia Procedure Notes (Signed)
 Procedure Name: Intubation Date/Time: 02/10/2024 7:40 AM  Performed by: Raylene Calamity, CRNAPre-anesthesia Checklist: Patient identified, Emergency Drugs available, Suction available and Patient being monitored Patient Re-evaluated:Patient Re-evaluated prior to induction Oxygen Delivery Method: Circle System Utilized Preoxygenation: Pre-oxygenation with 100% oxygen Induction Type: IV induction Ventilation: Mask ventilation without difficulty Laryngoscope Size: Mac and 4 Grade View: Grade II Tube type: Oral Tube size: 7.5 mm Number of attempts: 1 Airway Equipment and Method: Stylet and Oral airway Placement Confirmation: ETT inserted through vocal cords under direct vision, positive ETCO2 and breath sounds checked- equal and bilateral Secured at: 23 cm Tube secured with: Tape Dental Injury: Teeth and Oropharynx as per pre-operative assessment

## 2024-02-10 NOTE — H&P (Signed)
 ORTHOPAEDIC H and P  REQUESTING PHYSICIAN: Janeth Medicus, MD  PCP:  Zhang Banter, MD  Chief Complaint: Right shoulder osteoarthritis  HPI: Jeremiah Zhang is a 78 y.o. male who complains of right shoulder pain and weakness.  Here today for reverse arthroplasty for definitive treatment.  Past Medical History:  Diagnosis Date   Actinic keratosis    Anticoagulant long-term use    ELIQUIS   Arthritis    Basal cell carcinoma 01/27/2010   Right spinal upper back. Superficial.    Basal cell carcinoma 11/15/2019   Left inferior antihelix post. to tragus. Superficial and nodular patterns. Excised: 11/27/2019   Basal cell carcinoma 11/15/2019   Left mid lateral nose. Nodular and infiltrative patterns. Excised: 11/27/2019   Basal cell carcinoma 11/27/2019   Left mid superior helix. BCC with focal sclerosis.   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 Slight atypia. Edges free.   Dysplastic nevus 06/30/2023   R flank, moderate atypia   ED (erectile dysfunction)    GERD (gastroesophageal reflux disease)    Hiatal hernia    History of DVT of lower extremity    POST OP KNEE SURGERY   History of transient ischemic attack (TIA)    PER PT HX  ?POSSIBLE-- PT ON ELIQUIS   Hyperlipidemia    Hypertension    Hypogonadism male    OSA (obstructive sleep apnea)    per pt positive sleep study yrs ago recommended CPAP --  pt Non- compliant -- states uses Breath Rite Nasal Strips   Peripheral neuropathy    Prostate cancer [facility]) urologist-  dr Isla Mari  oncologist-- dr Lorri Rota   dx 03-25-2015  T1c, Gleason 4+3  s/p  cryoablation prostate 06-02-2015/  external  radiation therapy 08-16-2016 to 09-20-2016/  scheduled for radioactive seed implants 10-22-2016   TIA (transient ischemic attack)    ?? 08/20/2014? Eliquis 07/02/15   Type  2 diabetes mellitus (HCC)    followed by dr Zhang Banter [facility] clinic)  last A1c 8.4 on 07-27-2016 per lov note   Umbilical hernia    Wears glasses    Wears hearing aid    bilateral   Past Surgical History:  Procedure Laterality Date   COLONOSCOPY WITH PROPOFOL  N/A 05/30/2017   Procedure: COLONOSCOPY WITH PROPOFOL ;  Surgeon: Cassie Click, MD;  Location: [facility] ENDOSCOPY;  Service: Endoscopy;  Laterality: N/A;   COLONOSCOPY WITH PROPOFOL  N/A 12/28/2021   Procedure: COLONOSCOPY WITH PROPOFOL ;  Surgeon: Quintin Buckle, DO;  Location: [facility] ENDOSCOPY;  Service: Gastroenterology;  Laterality: N/A;   CRYOABLATION N/A 06/02/2015   Procedure: CRYO ABLATION PROSTATE;  Surgeon: Annamarie Kid, MD;  Location: [facility];  Service: Urology;  Laterality: N/A;   CYSTOSCOPY WITH URETHRAL DILATATION N/A 05/04/2019   Procedure: CYSTOSCOPY WITH URETHRAL DILATATION;  Surgeon: Osborn Blaze, MD;  Location: [facility];  Service: Urology;  Laterality: N/A;   ELBOW BURSA SURGERY Left Feb 1997   prostate biopsies     x12, cryotherapy   RADIOACTIVE SEED IMPLANT N/A 10/22/2016   Procedure: RADIOACTIVE SEED IMPLANT/BRACHYTHERAPY IMPLANT, URETHRAL STRICTURE DIALIATION;  Surgeon: Annamarie Kid, MD;  Location: [facility];  Service: Urology;  Laterality: N/A;   SHOULDER OPEN ROTATOR CUFF REPAIR Left 09-04-2014   skin cancer L ear  Dr Bary Likes    TOTAL KNEE ARTHROPLASTY Bilateral right 08-13-2008  &  left 11-14-2008   UMBILICAL HERNIA REPAIR  06-23-2012   ureter stretch  05/02/2019   [facility] Dr Secundino Dach   Social History   Socioeconomic History   Marital status: Married    Spouse name: Not on file   Number of children: 2   Years of education: Not on file   Highest education level: Doctorate  Occupational History   Not on file  Tobacco Use   Smoking status: Never   Smokeless tobacco: Never  Vaping Use   Vaping status: Never Used   Substance and Sexual Activity   Alcohol  use: Never   Drug use: Never   Sexual activity: Not Currently  Other Topics Concern   Not on file  Social History Narrative   Lives at home with wife   Caffeine: pepsi   Social Drivers of Health   Financial Resource Strain: Patient Declined (04/19/2023)   Received from [facility]   Overall Financial Resource Strain (CARDIA)    Difficulty of Paying Living Expenses: Patient declined  Food Insecurity: Patient Declined (04/19/2023)   Received from [facility]   Hunger Vital Sign    Worried About Running Out of Food in the Last Year: Patient declined    Ran Out of Food in the Last Year: Patient declined  Transportation Needs: Patient Declined (04/19/2023)   Received from [facility]   PRAPARE - Transportation    In the past 12 months, has lack of transportation kept you from medical appointments or from getting medications?: Patient declined    Lack of Transportation (Non-Medical): Patient declined  Physical Activity: Not on file  Stress: Not on file  Social Connections: Not on file   Family History  Problem Relation Age of Onset   Cancer Mother 28       colon/resection   Allergies  Allergen Reactions   Bee Venom Anaphylaxis   Yellow Jacket Venom Anaphylaxis   Crestor [Rosuvastatin] Other (See Comments)    "muscle weakness and discomfort"   Vytorin [Ezetimibe-Simvastatin] Other (See Comments)    "muscle weakness and discomfort"   Dynapen [Dicloxacillin] Rash   Jardiance [Empagliflozin] Rash    Yeast infection   Prior to Admission medications   Medication Sig Start Date End Date Taking? Authorizing Provider  amLODipine  (NORVASC ) 5 MG tablet Take 5 mg by mouth every evening. 05/09/23  Yes [provider]  apixaban  (ELIQUIS ) 5 MG TABS tablet TAKE 1 TABLET(5 MG) BY MOUTH TWICE DAILY 10/01/19  Yes [provider]  Ascorbic Acid (VITAMIN C) 1000 MG tablet Take 1,000 mg  by mouth 2 (two) times daily.   Yes [provider]  bisoprolol  (ZEBETA ) 5 MG tablet Take 5 mg by mouth every evening.   Yes [provider]  cyanocobalamin (VITAMIN B12) 1000 MCG tablet Take 1,000 mcg by mouth daily.   Yes [provider]  EPINEPHrine 0.3 mg/0.3 mL IJ SOAJ injection INJECT 0.3 MG INTO THE MUSCLE ONCE AS NEEDED FOR ANAPHYLAXIS 10/10/19  Yes [provider]  fluorouracil (EFUDEX) 5 % cream Apply 1 Application topically at bedtime. 01/20/24  Yes [provider]  furosemide  (LASIX ) 40 MG tablet Take 40 mg by mouth daily.  11/15/19  Yes [provider]  glimepiride  (AMARYL ) 2 MG tablet Take 2 mg by mouth 2 (two) times daily. 07/03/12  Yes [provider]  insulin  glargine-yfgn (SEMGLEE ) 100 UNIT/ML injection Inject 26 Units  into the skin at bedtime.   Yes [provider]  KRILL OIL PO Take 1 capsule by mouth daily.   Yes [provider]  lisinopril  (ZESTRIL ) 20 MG tablet Take 20 mg by mouth 2 (two) times daily.   Yes [provider]  metFORMIN  (GLUCOPHAGE ) 500 MG tablet Take 500 mg by mouth 2 (two) times daily.   Yes [provider]  NOVOLOG FLEXPEN 100 UNIT/ML FlexPen Inject 16 Units into the skin 3 (three) times daily with meals. 08/02/22  Yes [provider]  omeprazole (PRILOSEC) 40 MG capsule Take 40 mg by mouth daily.   Yes [provider]  OVER THE COUNTER MEDICATION Take 2 capsules by mouth daily. Super Joint Support supplement   Yes [provider]  OVER THE COUNTER MEDICATION Take 1 capsule by mouth daily. CinnaChroma supplement   Yes [provider]  pioglitazone (ACTOS) 45 MG tablet Take 45 mg by mouth daily.   Yes [provider]  Red Yeast Rice 600 MG CAPS Take 600 mg by mouth 2 (two) times daily.   Yes [provider]  Testosterone  20.25 MG/ACT (1.62%) GEL Apply 1 pump to 1 shoulder and 2 pumps to the other shoulder daily (3  pumps total) 08/20/22  Yes Stoioff, Kizzie Perks, MD  ACCU-CHEK FASTCLIX LANCETS MISC  10/29/16   [provider]   No results found.  Positive ROS: All other systems have been reviewed and were otherwise negative with the exception of those mentioned in the HPI and as above.  Physical Exam: General: Alert, no acute distress Cardiovascular: No pedal edema Respiratory: No cyanosis, no use of accessory musculature GI: No organomegaly, abdomen is soft and non-tender Skin: No lesions in the area of chief complaint Neurologic: Sensation intact distally Psychiatric: Patient is competent for consent with normal mood and affect Lymphatic: No axillary or cervical lymphadenopathy  MUSCULOSKELETAL: Right upper extremity is warm and well-perfused with no open wounds or lesions.  Neurovascular intact.  Assessment: Right shoulder end-stage osteoarthritis  Plan: Plan to proceed today with reverse arthroplasty given degree of deformity on the glenoid and chronicity.  He is in agreement.  Again we discussed the risk and benefits of the procedure which include but not limited to bleeding, infection, damage to surrounding nerves and vessels, stiffness, fracture, dislocation, need for revision surgery close risk of anesthesia.  He has provided informed consent.  Will admit postop for overnight observation.    Janeth Medicus, MD Cell [phone]    02/10/2024 7:12 AM

## 2024-02-10 NOTE — Transfer of Care (Signed)
 Immediate Anesthesia Transfer of Care Note  Patient: Jeremiah Zhang  Procedure(s) Performed: ARTHROPLASTY, SHOULDER, TOTAL, REVERSE (Right: Shoulder)  Patient Location: PACU  Anesthesia Type:General and GA combined with regional for post-op pain  Level of Consciousness: awake, alert , and oriented  Airway & Oxygen Therapy: Patient Spontanous Breathing and Patient connected to face mask oxygen  Post-op Assessment: Report given to RN and Post -op Vital signs reviewed and stable  Post vital signs: Reviewed and stable  Last Vitals:  Vitals Value Taken Time  BP 136/73 02/10/24 0915  Temp    Pulse 58 02/10/24 0917  Resp 21 02/10/24 0917  SpO2 96 % 02/10/24 0917  Vitals shown include unfiled device data.  Last Pain:  Vitals:   02/10/24 0614  TempSrc: Oral  PainSc:          Complications: No notable events documented.

## 2024-02-10 NOTE — Discharge Instructions (Addendum)
 Orthopedic surgery discharge instructions:  -Maintain postoperative bandage until follow-up appointment.  This is waterproof, and you may begin showering on postoperative day #3.  Do not submerge underwater.  Maintain that bandage until your follow-up appointment in 2 weeks.  -No lifting over 2 pounds with operateive arm.  You may use the arm immediately for activities of daily living such as bathing, washing your face and brushing your teeth, eating, and getting dressed.  Otherwise maintain your sling when you are out of the house and sleeping.  -Apply ice liberally to the shoulder throughout the day.  For mild to moderate pain use Tylenol  and Advil as needed around-the-clock.  For breakthrough pain use oxycodone  as necessary.  -You will return to see Dr. Hiram Lukes in the office in 2 weeks for routine postoperative check with x-rays.  -Stop lisinopril  for 2 weeks, stop lasix  for 2-3 days.

## 2024-02-10 NOTE — Plan of Care (Signed)

## 2024-02-10 NOTE — Anesthesia Procedure Notes (Signed)
 Anesthesia Regional Block: Interscalene brachial plexus block   Pre-Anesthetic Checklist: , timeout performed,  Correct Patient, Correct Site, Correct Laterality,  Correct Procedure, Correct Position, site marked,  Risks and benefits discussed,  Surgical consent,  Pre-op evaluation,  At surgeon's request and post-op pain management  Laterality: Right  Prep: chloraprep       Needles:  Injection technique: Single-shot  Needle Type: Echogenic Needle     Needle Length: 5cm  Needle Gauge: 21     Additional Needles:   Narrative:  Start time: 02/10/2024 7:19 AM End time: 02/10/2024 7:22 AM Injection made incrementally with aspirations every 5 mL.  Performed by: Personally  Anesthesiologist: [name], MD  Additional Notes: No pain on injection. No increased resistance to injection. Injection made in 5cc increments. Good needle visualization. Patient tolerated the procedure well.

## 2024-02-10 NOTE — Op Note (Signed)
 02/10/2024  9:33 AM  PATIENT:  Jeremiah Zhang    PRE-OPERATIVE DIAGNOSIS:  Right shoulder rotator cuff tear arthropathy  POST-OPERATIVE DIAGNOSIS:  Same  PROCEDURE: Right ARTHROPLASTY, SHOULDER, TOTAL, REVERSE  SURGEON:  Janeth Medicus, MD  ASSISTANT: Karyl Paget, PA-C  Assistant attestation:  PA McClung present and scrubbed for the entire procedure.  ANESTHESIA:   General With interscalene with Exparel   ESTIMATED BLOOD LOSS: 150 cc  PREOPERATIVE INDICATIONS:  Jeremiah Zhang is a  78 y.o. male with a diagnosis of Right shoulder osteoarthritis who failed conservative measures and elected for surgical management.    The risks benefits and alternatives were discussed with the patient preoperatively including but not limited to the risks of infection, bleeding, nerve injury, cardiopulmonary complications, the need for revision surgery, dislocation, brachial plexus palsy, incomplete relief of pain, among others, and the patient was willing to proceed.  OPERATIVE IMPLANTS:  Zimmer identity size 12 stem with a -6 mm extra extended neutral humeral tray with a comprehensive 12 degree polyethylene liner  Glenoid side a small augmented baseplate, orirented at [employer] with mini taper with a 25 mm compression screw and 4 peripheral locking screws.  36 mm +3 mm glenosphere   OPERATIVE FINDINGS:  Advanced rotator cuff arthropathy the changes with high riding humeral head and osteophytes of the greater tuberosity and deficiency of the supraspinatus and infraspinatus.  The subscapularis as well as teres minor were intact.  Long head of biceps tendon was retracted deep in the groove.  There was a 4 cm biceps stump on the superior labrum.  OPERATIVE PROCEDURE: The patient was brought to the operating room and placed in the supine position. General anesthesia was administered. IV antibiotics were given. A Foley was not placed. Time out was performed. The upper extremity was prepped  and draped in usual sterile fashion. The patient was in a beachchair position. Deltopectoral approach was carried out.  The subscapularis was released off of the bone.   I then performed circumferential releases of the humerus, and then dislocated the head, and then reamed with the reamer to the above named size.  I then applied the jig, and cut the humeral head in 30 of retroversion, and then turned my attention to the glenoid.  Deep retractors were placed, and I resected the labrum, and then placed a guidepin into the center position on the glenoid, with slight inferior inclination. I then reamed over the guidepin, and this created a small metaphyseal cancellus blush inferiorly, removing just the cartilage to the subchondral bone superiorly. The base plate was selected and impacted place, and then I secured it centrally with a nonlocking screw, and I had excellent purchase both inferiorly and superiorly. I placed a short locking screws on anterior and posterior aspects.  I then turned my attention to the glenosphere, and impacted this into place, placing slight inferior offset (set on A).   The glenoid sphere was completely seated, and had engagement of the [facility] taper. I then turned my attention back to the humerus.  I sequentially broached, and then trialed, and was found to restore soft tissue tension, and it had 2 finger tightness. Therefore the above named components were selected. The shoulder felt stable throughout functional motion.   I then impacted the real prosthesis into place, as well as the real humeral tray, and reduced the shoulder. The shoulder had excellent motion, and was stable, and I irrigated the wounds copiously.     I then irrigated the shoulder  copiously once more, 1 gram of Vanc into the wound, then repaired the deltopectoral interval with #2 FiberWire followed by subcutaneous Vicryl, then monocryl for the skin,  with Steri-Strips and sterile gauze for the skin. The  patient was awakened and returned back in stable and satisfactory condition. There no complications and they tolerated the procedure well.  All counts were correct x2.   Disposition: Jeremiah Zhang will be in a sling for the first 2 weeks from surgery but may begin using the arm immediately for activities of daily living.  He will sleep in the sling as well for 2 weeks.  He will be admitted for postoperative observation overnight.  Likely discharge home tomorrow.  Follow-up with me in 2 weeks with 2 x-ray views of the right shoulder AP and Scap Y.

## 2024-02-10 NOTE — Brief Op Note (Signed)
 02/10/2024  9:27 AM  PATIENT:  Jeremiah Zhang  78 y.o. male  PRE-OPERATIVE DIAGNOSIS:  Right shoulder osteoarthritis  POST-OPERATIVE DIAGNOSIS:  Right shoulder osteoarthritis  PROCEDURE:  Procedure(s): ARTHROPLASTY, SHOULDER, TOTAL, REVERSE (Right)  SURGEON:  Surgeons and Role:    * Janeth Medicus, MD - Primary  PHYSICIAN ASSISTANT: Karyl Paget, PA-C   ANESTHESIA:   Interscalene with Exparel , general  EBL:  150 mL   BLOOD ADMINISTERED:none  DRAINS: none   LOCAL MEDICATIONS USED:  NONE  SPECIMEN:  No Specimen  DISPOSITION OF SPECIMEN:  N/A  COUNTS:  YES  TOURNIQUET:  * No tourniquets in log *  DICTATION: .Note written in EPIC  PLAN OF CARE: Admit for overnight observation  PATIENT DISPOSITION:  PACU - hemodynamically stable.   Delay start of Pharmacological VTE agent (>24hrs) due to surgical blood loss or risk of bleeding: not applicable

## 2024-02-11 LAB — BASIC METABOLIC PANEL WITH GFR
Anion gap: 13 (ref 5–15)
Anion gap: 10 (ref 5–15)
BUN: 49 mg/dL — ABNORMAL HIGH (ref 8–23)
BUN: 48 mg/dL — ABNORMAL HIGH (ref 8–23)
CO2: 22 mmol/L (ref 22–32)
CO2: 24 mmol/L (ref 22–32)
Calcium: 8.9 mg/dL (ref 8.9–10.3)
Calcium: 8.7 mg/dL — ABNORMAL LOW (ref 8.9–10.3)
Chloride: 99 mmol/L (ref 98–111)
Chloride: 101 mmol/L (ref 98–111)
Creatinine, Ser: 2.57 mg/dL — ABNORMAL HIGH (ref 0.61–1.24)
Creatinine, Ser: 2.12 mg/dL — ABNORMAL HIGH (ref 0.61–1.24)
GFR, Estimated: 25 mL/min — ABNORMAL LOW (ref >60)
GFR, Estimated: 31 mL/min — ABNORMAL LOW (ref >60)
Glucose, Bld: 288 mg/dL — ABNORMAL HIGH (ref 70–99)
Glucose, Bld: 211 mg/dL — ABNORMAL HIGH (ref 70–99)
Potassium: 4.5 mmol/L (ref 3.5–5.1)
Potassium: 4 mmol/L (ref 3.5–5.1)
Sodium: 134 mmol/L — ABNORMAL LOW (ref 135–145)
Sodium: 135 mmol/L (ref 135–145)

## 2024-02-11 LAB — HEMOGLOBIN AND HEMATOCRIT, BLOOD
HCT: 35.4 % — ABNORMAL LOW (ref 39.0–52.0)
Hemoglobin: 11.3 g/dL — ABNORMAL LOW (ref 13.0–17.0)

## 2024-02-11 MED ORDER — METFORMIN HCL 500 MG PO TABS
500.0000 mg | ORAL_TABLET | Freq: Two times a day (BID) | ORAL | Status: DC
  Administered 2024-02-12: 500 mg via ORAL
  Filled 2024-02-11: qty 1

## 2024-02-11 MED ORDER — METFORMIN HCL 500 MG PO TABS: 500.0000 mg | ORAL_TABLET | Freq: Two times a day (BID) | ORAL | Status: DC

## 2024-02-11 MED ORDER — GLIMEPIRIDE 2 MG PO TABS
2.0000 mg | ORAL_TABLET | Freq: Two times a day (BID) | ORAL | Status: DC
  Administered 2024-02-11 – 2024-02-12 (×4): 2 mg via ORAL
  Filled 2024-02-11 (×6): qty 1

## 2024-02-11 NOTE — Plan of Care (Signed)
  Problem: Activity: Goal: Risk for activity intolerance will decrease Outcome: Progressing   Problem: Pain Managment: Goal: General experience of comfort will improve and/or be controlled Outcome: Progressing

## 2024-02-11 NOTE — Progress Notes (Signed)
    Subjective: Patient seen in rounds for Dr. Hiram Lukes.  Patient reports pain as mild to moderate.  Denies N/V/CP/SOB/Abd pain. He denies any tingling or numbness in RUE.  He is wearing his sling.  He reports he has voided today with decreased amount, but has been improving. He did well with OT today.    Objective:   VITALS:   Vitals:   02/10/24 2150 02/10/24 2241 02/11/24 0133 02/11/24 0607  BP: 110/64  116/67 130/79  Pulse: 66 64 85 87  Resp: 18 18 13 14   Temp: (!) 97.3 F (36.3 C)  98.2 F (36.8 C) 98.3 F (36.8 C)  TempSrc: Oral  Oral Oral  SpO2: 92% 92% 94% 97%  Weight:      Height:        Patient sitting in recliner. NAD. He is wearing sling and ice machine.  He can move fingers and wrist well on exam today. Distal radial and ulnar pulses 2+ bilaterally. Sensation intact to light touch.  Mild expected swelling and peri-incisional bruising.  Aquacel dressing C/D/I.     Lab Results  Component Value Date   WBC 5.2 01/31/2024   HGB 11.3 (L) 02/11/2024   HCT 35.4 (L) 02/11/2024   MCV 99.7 01/31/2024   PLT 270 01/31/2024   BMET    Component Value Date/Time   NA 134 (L) 02/11/2024 0337   NA 141 08/12/2020 1024   NA 142 08/27/2014 1114   K 4.5 02/11/2024 0337   K 3.6 08/27/2014 1114   CL 99 02/11/2024 0337   CL 102 08/27/2014 1114   CO2 22 02/11/2024 0337   CO2 30 08/27/2014 1114   GLUCOSE 288 (H) 02/11/2024 0337   GLUCOSE 154 (H) 08/27/2014 1114   BUN 49 (H) 02/11/2024 0337   BUN 39 (H) 08/12/2020 1024   BUN 29 (H) 08/27/2014 1114   CREATININE 2.57 (H) 02/11/2024 0337   CREATININE 1.49 (H) 08/27/2014 1114   CALCIUM 8.9 02/11/2024 0337   CALCIUM 9.1 08/27/2014 1114   GFRNONAA 25 (L) 02/11/2024 0337   GFRNONAA 50 (L) 08/27/2014 1114   GFRNONAA 53 (L) 06/15/2012 1316     Assessment/Plan: 1 Day Post-Op   Principal Problem:   S/P reverse total shoulder arthroplasty, right   - Elevated creatinine 2.57 today (1.90 01/31/24). Continue PO and IV fluids  PRN. Recheck BMP 2:00pm for downward trend.  - Continue incentive spirometry. - Continue sling for the first 2 weeks from surgery but may begin using the arm immediately for activities of daily living. He will sleep in the sling as well for 2 weeks.  - D/c pending improved creatinine either today or tomorrow.  - Patient will Follow-up with Dr. Hiram Lukes in 2 weeks with 2 x-ray views of the right shoulder AP and Scap Y.    Harman Lightning 02/11/2024, 11:38 AM   [facility]  [place] [address], [address], [place], [place] [address] Phone: [phone] [email or web address] Facebook  [employer]

## 2024-02-11 NOTE — Evaluation (Signed)
 Occupational Therapy Evaluation Patient Details Name: Jeremiah Zhang MRN: [id number] DOB: 07/24/1946 Today's Date: 02/11/2024   History of Present Illness   Patient is a 78 year old male who presented with  Right shoulder rotator cuff tear arthropathy. Patient underwent total reverse right shoulder arthroplasty. PMH: a tinic keratosis, basal cell carcinoma, GERD, HTN, prostate cancer, TIA, arthritis, peripheral neuropathy.     Clinical Impressions s/p shoulder replacement without functional use of RUE secondary to effects of surgery and interscalene block and shoulder precautions. Therapist provided education and instruction to patient  in regards to exercises, precautions, positioning, donning upper extremity clothing and bathing while maintaining shoulder precautions, ice and edema management, use of ice machine and donning/doffing sling. Patient verbalized understanding and demonstrated as needed. Patient needed assistance to donn shirt, underwear, pants, socks and shoes and provided with instruction on compensatory strategies to perform ADLs. He reported wife would assist at home as she already had been doing. Patient to follow up with MD for further therapy needs.       If plan is discharge home, recommend the following:   A little help with walking and/or transfers;Assistance with cooking/housework;Direct supervision/assist for medications management;Assist for transportation;Help with stairs or ramp for entrance;Direct supervision/assist for financial management;A little help with bathing/dressing/bathroom     Functional Status Assessment   Patient has had a recent decline in their functional status and demonstrates the ability to make significant improvements in function in a reasonable and predictable amount of time.     Equipment Recommendations   None recommended by OT      Precautions/Restrictions   Precautions Precautions: Shoulder Type of Shoulder Precautions:  shoulder AROM/PROM FF 0-90, ER 0-30, no abduction. ok for hand wrist and elbow AROM, Shoulder Interventions: Shoulder sling/immobilizer;Off for dressing/bathing/exercises;At all times Precaution Booklet Issued: Yes (comment) (handout) Restrictions Weight Bearing Restrictions Per Provider Order: Yes RUE Weight Bearing Per Provider Order: Non weight bearing     Mobility Bed Mobility Overal bed mobility: Needs Assistance             General bed mobility comments: patient was sitting EOB.        Balance Overall balance assessment: Mild deficits observed, not formally tested                 ADL either performed or assessed with clinical judgement   ADL Overall ADL's : Needs assistance/impaired Eating/Feeding: Modified independent;Sitting   Grooming: Sitting;Set up   Upper Body Bathing: Sitting;Minimal assistance Upper Body Bathing Details (indicate cue type and reason): education on ROM allowances for ADLs Lower Body Bathing: Minimal assistance;Sitting/lateral leans   Upper Body Dressing : Moderate assistance;Sitting Upper Body Dressing Details (indicate cue type and reason): education to avoid abduction, able to carryover later in session. Lower Body Dressing: Minimal assistance;Sit to/from stand Lower Body Dressing Details (indicate cue type and reason): to pull up pants on R side and to get belt in place. reproted wife could help with this. educated on wearing sweat pants. Toilet Transfer: Supervision/safety;Set up Toilet Transfer Details (indicate cue type and reason): no LOB. unsteady at baseline with neuropathy.                 Vision Baseline Vision/History: 1 Wears glasses Ability to See in Adequate Light: 2 Moderately impaired Patient Visual Report: No change from baseline              Pertinent Vitals/Pain Pain Assessment Pain Assessment: 0-10 (block wore off at 3 am per patient report)  Pain Score: 6  Pain Location: R shoulder Pain  Descriptors / Indicators: Grimacing Pain Intervention(s): Limited activity within patient's tolerance, Monitored during session     Extremity/Trunk Assessment Upper Extremity Assessment Upper Extremity Assessment: RUE deficits/detail RUE Deficits / Details: sling, able to move hand wrist and forarm. pain with elbow flexion with edema impacting level of flexion, educated on ROM but constant cues for keeping elbow tucked to avoid abduction. RUE: Unable to fully assess due to immobilization RUE Coordination: decreased gross motor   Lower Extremity Assessment Lower Extremity Assessment: Overall WFL for tasks assessed   Cervical / Trunk Assessment Cervical / Trunk Assessment: Normal      Cognition Arousal: Alert Behavior During Therapy: WFL for tasks assessed/performed Cognition: No apparent impairments             OT - Cognition Comments: noted to be talktative during session with occational redirection provided.                               Shoulder Instructions Shoulder Instructions Donning/doffing shirt without moving shoulder: Patient able to independently direct caregiver Method for sponge bathing under operated UE: Patient able to independently direct caregiver Donning/doffing sling/immobilizer: Patient able to independently direct caregiver Correct positioning of sling/immobilizer: Modified independent ROM for elbow, wrist and digits of operated UE: Modified independent Proper positioning of operated UE when showering: Patient able to independently direct caregiver Positioning of UE while sleeping: Patient able to independently direct caregiver    Home Living Family/patient expects to be discharged to:: Private residence Living Arrangements: Spouse/significant other Available Help at Discharge: Family;Available 24 hours/day              Prior Functioning/Environment Prior Level of Function : Independent/Modified Independent                     OT Problem List: Decreased range of motion;Impaired UE functional use;Decreased activity tolerance;Decreased knowledge of use of DME or AE;Decreased safety awareness   OT Treatment/Interventions:        OT Goals(Current goals can be found in the care plan section)   Acute Rehab OT Goals Patient Stated Goal: to get home OT Goal Formulation: With patient Time For Goal Achievement: 02/25/24 Potential to Achieve Goals: Fair   OT Frequency:          AM-PAC OT "6 Clicks" Daily Activity     Outcome Measure Help from another person eating meals?: None Help from another person taking care of personal grooming?: None Help from another person toileting, which includes using toliet, bedpan, or urinal?: A Little (pants) Help from another person bathing (including washing, rinsing, drying)?: A Lot Help from another person to put on and taking off regular upper body clothing?: A Lot Help from another person to put on and taking off regular lower body clothing?: A Lot 6 Click Score: 17   End of Session Equipment Utilized During Treatment: Other (comment) (sling) Nurse Communication: Mobility status  Activity Tolerance: Patient tolerated treatment well Patient left: in chair;with call bell/phone within reach  OT Visit Diagnosis: Unsteadiness on feet (R26.81);Other abnormalities of gait and mobility (R26.89);Pain Pain - Right/Left: Right Pain - part of body: Shoulder                Time: 4098-1191 OT Time Calculation (min): 55 min Charges:  OT General Charges $OT Visit: 1 Visit OT Evaluation $OT Eval Low Complexity: 1 Low OT Treatments $Self  Care/Home Management : 38-52 mins  Wynette Heckler, MS Acute Rehabilitation Department Office# [phone]   Jame Maze 02/11/2024, 10:38 AM

## 2024-02-12 DIAGNOSIS — Z96611 Presence of right artificial shoulder joint: Secondary | ICD-10-CM | POA: Diagnosis not present

## 2024-02-12 LAB — BASIC METABOLIC PANEL WITH GFR
Anion gap: 10 (ref 5–15)
BUN: 45 mg/dL — ABNORMAL HIGH (ref 8–23)
CO2: 25 mmol/L (ref 22–32)
Calcium: 8.5 mg/dL — ABNORMAL LOW (ref 8.9–10.3)
Chloride: 102 mmol/L (ref 98–111)
Creatinine, Ser: 2.44 mg/dL — ABNORMAL HIGH (ref 0.61–1.24)
GFR, Estimated: 26 mL/min — ABNORMAL LOW (ref >60)
Glucose, Bld: 240 mg/dL — ABNORMAL HIGH (ref 70–99)
Potassium: 4.1 mmol/L (ref 3.5–5.1)
Sodium: 137 mmol/L (ref 135–145)

## 2024-02-12 LAB — GLUCOSE, CAPILLARY
Glucose-Capillary: 205 mg/dL — ABNORMAL HIGH (ref 70–99)
Glucose-Capillary: 172 mg/dL — ABNORMAL HIGH (ref 70–99)

## 2024-02-12 MED ORDER — INSULIN ASPART 100 UNIT/ML IJ SOLN
10.0000 [IU] | Freq: Three times a day (TID) | INTRAMUSCULAR | Status: DC
  Administered 2024-02-12 – 2024-02-13 (×3): 10 [IU] via SUBCUTANEOUS

## 2024-02-12 MED ORDER — SODIUM CHLORIDE 0.9 % IV SOLN: INTRAVENOUS | Status: AC

## 2024-02-12 MED ORDER — INSULIN ASPART 100 UNIT/ML IJ SOLN
0.0000 [IU] | Freq: Three times a day (TID) | INTRAMUSCULAR | Status: DC
  Administered 2024-02-12 – 2024-02-13 (×3): 5 [IU] via SUBCUTANEOUS

## 2024-02-12 MED ORDER — INSULIN ASPART 100 UNIT/ML IJ SOLN: 0.0000 [IU] | Freq: Every day | INTRAMUSCULAR | Status: DC

## 2024-02-12 NOTE — Plan of Care (Signed)
   Problem: Education: Goal: Knowledge of General Education information will improve Description: Including pain rating scale, medication(s)/side effects and non-pharmacologic comfort measures Outcome: Progressing   Problem: Health Behavior/Discharge Planning: Goal: Ability to manage health-related needs will improve Outcome: Progressing   Problem: Clinical Measurements: Goal: Ability to maintain clinical measurements within normal limits will improve Outcome: Progressing Goal: Will remain free from infection Outcome: Progressing Goal: Diagnostic test results will improve Outcome: Progressing Goal: Respiratory complications will improve Outcome: Progressing Goal: Cardiovascular complication will be avoided Outcome: Progressing   Problem: Activity: Goal: Risk for activity intolerance will decrease Outcome: Progressing   Problem: Nutrition: Goal: Adequate nutrition will be maintained Outcome: Progressing   Problem: Coping: Goal: Level of anxiety will decrease Outcome: Progressing   Problem: Elimination: Goal: Will not experience complications related to bowel motility Outcome: Progressing Goal: Will not experience complications related to urinary retention Outcome: Progressing   Problem: Pain Managment: Goal: General experience of comfort will improve and/or be controlled Outcome: Progressing   Problem: Safety: Goal: Ability to remain free from injury will improve Outcome: Progressing   Problem: Skin Integrity: Goal: Risk for impaired skin integrity will decrease Outcome: Progressing   Problem: Education: Goal: Knowledge of the prescribed therapeutic regimen will improve Outcome: Progressing Goal: Understanding of activity limitations/precautions following surgery will improve Outcome: Progressing Goal: Individualized Educational Video(s) Outcome: Progressing   Problem: Activity: Goal: Ability to tolerate increased activity will improve Outcome: Progressing    Problem: Pain Management: Goal: Pain level will decrease with appropriate interventions Outcome: Progressing

## 2024-02-12 NOTE — Progress Notes (Signed)
   02/12/24 0042  BiPAP/CPAP/SIPAP  Reason BIPAP/CPAP not in use Other(comment) (pt does not wear)

## 2024-02-12 NOTE — Progress Notes (Signed)
   02/12/24 2236  BiPAP/CPAP/SIPAP  BiPAP/CPAP/SIPAP Pt Type Adult  Reason BIPAP/CPAP not in use Other(comment) (pt doesnt use)

## 2024-02-12 NOTE — Progress Notes (Signed)
 PT Cancellation Note  Patient Details Name: NASHOBA COCCA MRN: [id number] DOB: 1946/09/06   Cancelled Treatment:     PT order received but eval deferred.  Per OT, pt is IND in room and hallways and with no current PT needs.  PT service will sign off at this time.   Zayan Delvecchio 02/12/2024, 12:32 PM

## 2024-02-12 NOTE — Progress Notes (Signed)
 Occupational Therapy Treatment Patient Details Name: Jeremiah Zhang MRN: [id number] DOB: 10/22/1945 Today's Date: 02/12/2024   History of present illness Patient is a 78 year old male who presented with  Right shoulder rotator cuff tear arthropathy. Patient underwent total reverse right shoulder arthroplasty. PMH: a tinic keratosis, basal cell carcinoma, GERD, HTN, prostate cancer, TIA, arthritis, peripheral neuropathy.   OT comments  Patient noted to have increased confusion after session with increased creatinine with medical hold on d/c on 4/26. Therapist provided education and instruction to patient and wife in scheduled session in regards to exercises, precautions, positioning, donning upper extremity clothing and bathing while maintaining shoulder precautions, ice and edema management, use of ice machine and donning/doffing sling. Patient and wife verbalized understanding and demonstrated as needed. Patient needed assistance to donn shirt, underwear, pants, socks and shoes and provided with instruction on compensatory strategies to perform ADLs. Patient reported he had no memory of previous session on 4/26. Mobility specialist referral placed on this date to keep patient mobile during extended stay. Patient to follow up with MD for further therapy needs.        If plan is discharge home, recommend the following:  A little help with walking and/or transfers;Assistance with cooking/housework;Direct supervision/assist for medications management;Assist for transportation;Help with stairs or ramp for entrance;Direct supervision/assist for financial management;A little help with bathing/dressing/bathroom   Equipment Recommendations  None recommended by OT       Precautions / Restrictions Precautions Precautions: Shoulder Type of Shoulder Precautions: shoulder AROM/PROM FF 0-90, ER 0-30, no abduction. ok for hand wrist and elbow AROM, Shoulder Interventions: Shoulder sling/immobilizer;Off for  dressing/bathing/exercises;At all times Precaution Booklet Issued: Yes (comment) Restrictions Weight Bearing Restrictions Per Provider Order: Yes RUE Weight Bearing Per Provider Order: Non weight bearing              ADL either performed or assessed with clinical judgement   ADL Overall ADL's : Needs assistance/impaired         Toilet Transfer: Supervision/safety Toilet Transfer Details (indicate cue type and reason): patient is unsteady at baseline with neuropathy in both feet. patient was able to walk a full lap around the unit with IV team present upon return.                  Cognition Arousal: Alert Behavior During Therapy: WFL for tasks assessed/performed               OT - Cognition Comments: patients wife was called to join this session as patient had increased confusion over PM after session with OT yesterday. patient was unsafe getting up and unplugging the ice cuff with water spilling on floor multiple times. patient reported he had no memory of therapy previous day. wife was educated on all shoulder precautions. wife verbalized understanding.                 Following commands: Intact              Shoulder Instructions Shoulder Instructions Donning/doffing shirt without moving shoulder: Caregiver independent with task Method for sponge bathing under operated UE: Caregiver independent with task Donning/doffing sling/immobilizer: Caregiver independent with task Correct positioning of sling/immobilizer: Caregiver independent with task ROM for elbow, wrist and digits of operated UE: Caregiver independent with task;Supervision/safety (patient was able to complete during session with wife cuing patient to go slow with each rep.) Sling wearing schedule (on at all times/off for ADL's): Caregiver independent with task Proper positioning of operated UE when  showering: Caregiver independent with task Positioning of UE while sleeping: Caregiver  independent with task     General Comments      Pertinent Vitals/ Pain       Pain Assessment Pain Assessment: Faces Faces Pain Scale: Hurts a little bit Pain Location: R shoulder Pain Descriptors / Indicators: Constant Pain Intervention(s): Limited activity within patient's tolerance, Monitored during session, Ice applied      Progress Toward Goals  OT Goals(current goals can now be found in the care plan section)  Progress towards OT goals: Goals met/education completed, patient discharged from OT     Plan         AM-PAC OT "6 Clicks" Daily Activity     Outcome Measure   Help from another person eating meals?: None Help from another person taking care of personal grooming?: None Help from another person toileting, which includes using toliet, bedpan, or urinal?: A Little Help from another person bathing (including washing, rinsing, drying)?: A Lot Help from another person to put on and taking off regular upper body clothing?: A Lot Help from another person to put on and taking off regular lower body clothing?: A Lot 6 Click Score: 17    End of Session Equipment Utilized During Treatment: Other (comment) (sling)  OT Visit Diagnosis: Unsteadiness on feet (R26.81);Other abnormalities of gait and mobility (R26.89);Pain Pain - Right/Left: Right Pain - part of body: Shoulder   Activity Tolerance Patient tolerated treatment well   Patient Left in chair;with call bell/phone within reach;with family/visitor present   Nurse Communication Mobility status        Time: 1610-9604 OT Time Calculation (min): 25 min  Charges: OT General Charges $OT Visit: 1 Visit OT Treatments $Self Care/Home Management : 23-37 mins  Wynette Heckler, MS Acute Rehabilitation Department Office# [phone]   Jame Maze 02/12/2024, 3:41 PM

## 2024-02-12 NOTE — Plan of Care (Signed)

## 2024-02-12 NOTE — Progress Notes (Signed)
    Subjective: Patient seen in rounds for Dr. Hiram Lukes.  Patient reports pain as mild to moderate.  Denies N/V/CP/SOB/Abd pain. He denies any tingling or numbness in RUE.  He is wearing his sling.  He did well with OT yesterday.    Objective:   VITALS:   Vitals:   02/11/24 0133 02/11/24 0607 02/11/24 1434 02/11/24 2116  BP: 116/67 130/79 109/68 128/74  Pulse: 85 87 71 80  Resp: 13 14 17 16   Temp: 98.2 F (36.8 C) 98.3 F (36.8 C) 97.8 F (36.6 C) 98 F (36.7 C)  TempSrc: Oral Oral Oral Oral  SpO2: 94% 97% 93% 91%  Weight:      Height:        Patient sitting in recliner. NAD. He is wearing sling and ice machine.  He can move fingers and wrist well on exam today. Distal radial and ulnar pulses 2+ bilaterally. Sensation intact to light touch.  Mild expected swelling and peri-incisional bruising.  Aquacel dressing C/D/I.     Lab Results  Component Value Date   WBC 5.2 01/31/2024   HGB 11.3 (L) 02/11/2024   HCT 35.4 (L) 02/11/2024   MCV 99.7 01/31/2024   PLT 270 01/31/2024   BMET    Component Value Date/Time   NA 135 02/11/2024 1403   NA 141 08/12/2020 1024   NA 142 08/27/2014 1114   K 4.0 02/11/2024 1403   K 3.6 08/27/2014 1114   CL 101 02/11/2024 1403   CL 102 08/27/2014 1114   CO2 24 02/11/2024 1403   CO2 30 08/27/2014 1114   GLUCOSE 211 (H) 02/11/2024 1403   GLUCOSE 154 (H) 08/27/2014 1114   BUN 48 (H) 02/11/2024 1403   BUN 39 (H) 08/12/2020 1024   BUN 29 (H) 08/27/2014 1114   CREATININE 2.12 (H) 02/11/2024 1403   CREATININE 1.49 (H) 08/27/2014 1114   CALCIUM 8.7 (L) 02/11/2024 1403   CALCIUM 9.1 08/27/2014 1114   GFRNONAA 31 (L) 02/11/2024 1403   GFRNONAA 50 (L) 08/27/2014 1114   GFRNONAA 53 (L) 06/15/2012 1316     Assessment/Plan: 2 Days Post-Op   Principal Problem:   S/P reverse total shoulder arthroplasty, right  - DC pending Hospitalist eval - Continue PO and IV fluids PRN. Recheck BMP for downward trend.  - Continue incentive  spirometry. - Continue sling for the first 2 weeks from surgery but may begin using the arm immediately for activities of daily living. He will sleep in the sling as well for 2 weeks.  - D/c pending improved creatinine either today or tomorrow.  - Patient will Follow-up with Dr. Hiram Lukes in 2 weeks with 2 x-ray views of the right shoulder AP and Scap Y.    Ali Ink 02/12/2024, 2:27 AM   [facility]  [facility] [address], [place], [place] [address] Phone: [phone] [email or web address] Facebook  [employer]

## 2024-02-12 NOTE — Consult Note (Signed)
 [facility] Initial Consultation Note  Jeremiah Zhang WUJ:[id number] DOB: December 22, 1945 DOA: 02/10/2024  PCP: Nestor Banter, MD   Requesting Physician: Dr. Cherl Corner   Reason for Consultation: AKI  HPI: Jeremiah Zhang is a 78 y.o. male with medical history significant for DVT on Eliquis, hypertension, insulin-dependent type 2 diabetes, OSA admitted to the orthopedic service for elective reverse right shoulder replacement who is now being seen in consultation by the medical service for acute kidney injury. Was sleepy so didn't eat or drink very much after surgery, he says urine is dark and only small amounts. He held his Eliquis at the start of the week in preparation for surgery. His wife agrees that his right leg looks larger than the left one which is new. He denies any nausea, cough, or chest pain. He has been ambulating independently.   Review of Systems: Please see HPI for pertinent positives and negatives. A complete 10 system review of systems are otherwise negative.  Past Medical History:  Diagnosis Date   Actinic keratosis    Anticoagulant long-term use    ELIQUIS   Arthritis    Basal cell carcinoma 01/27/2010   Right spinal upper back. Superficial.    Basal cell carcinoma 11/15/2019   Left inferior antihelix post. to tragus. Superficial and nodular patterns. Excised: 11/27/2019   Basal cell carcinoma 11/15/2019   Left mid lateral nose. Nodular and infiltrative patterns. Excised: 11/27/2019   Basal cell carcinoma 11/27/2019   Left mid superior helix. BCC with focal sclerosis.   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 Slight atypia. Edges free.   Dysplastic nevus 06/30/2023   R flank, moderate atypia   ED (erectile dysfunction)    GERD (gastroesophageal reflux disease)    Hiatal hernia    History of DVT of  lower extremity    POST OP KNEE SURGERY   History of transient ischemic attack (TIA)    PER PT HX  ?POSSIBLE-- PT ON ELIQUIS   Hyperlipidemia    Hypertension    Hypogonadism male    OSA (obstructive sleep apnea)    per pt positive sleep study yrs ago recommended CPAP --  pt Non- compliant -- states uses Breath Rite Nasal Strips   Peripheral neuropathy    Prostate cancer [facility]) urologist-  dr Isla Mari  oncologist-- dr Lorri Rota   dx 03-25-2015  T1c, Gleason 4+3  s/p  cryoablation prostate 06-02-2015/  external  radiation therapy 08-16-2016 to 09-20-2016/  scheduled for radioactive seed implants 10-22-2016   TIA (transient ischemic attack)    ?? 08/20/2014? Eliquis 07/02/15   Type 2 diabetes mellitus (HCC)    followed by dr Nestor Ban[facility] clinic)  last A1c 8.4 on 07-27-2016 per lov note   Umbilical hernia    Wears glasses    Wears hearing aid    bilateral   Past Surgical History:  Procedure Laterality Date   COLONOSCOPY WITH PROPOFOL  N/A 05/30/2017   Procedure: COLONOSCOPY WITH PROPOFOL ;  Surgeon: Cassie Click, MD;  Location: [facility] ENDOSCOPY;  Service: Endoscopy;  Laterality: N/A;   COLONOSCOPY WITH PROPOFOL  N/A 12/28/2021   Procedure: COLONOSCOPY WITH PROPOFOL ;  Surgeon: Quintin Buckle, DO;  Location: [facility] ENDOSCOPY;  Service: Gastroenterology;  Laterality: N/A;   CRYOABLATION N/A 06/02/2015   Procedure: CRYO ABLATION PROSTATE;  Surgeon:  Annamarie Kid, MD;  Location: [facility];  Service: Urology;  Laterality: N/A;   CYSTOSCOPY WITH URETHRAL DILATATION N/A 05/04/2019   Procedure: CYSTOSCOPY WITH URETHRAL DILATATION;  Surgeon: Osborn Blaze, MD;  Location: [facility];  Service: Urology;  Laterality: N/A;   ELBOW BURSA SURGERY Left Feb 1997   prostate biopsies     x12, cryotherapy   RADIOACTIVE SEED IMPLANT N/A 10/22/2016   Procedure: RADIOACTIVE SEED IMPLANT/BRACHYTHERAPY IMPLANT, URETHRAL STRICTURE DIALIATION;  Surgeon: Annamarie Kid, MD;  Location: [facility];  Service: Urology;  Laterality: N/A;   SHOULDER OPEN ROTATOR CUFF REPAIR Left 09-04-2014   skin cancer L ear     Dr Bary Likes    TOTAL KNEE ARTHROPLASTY Bilateral right 08-13-2008  &  left 11-14-2008   UMBILICAL HERNIA REPAIR  06-23-2012   ureter stretch  05/02/2019   [facility] Dr Secundino Dach    Social History:  reports that he has never smoked. He has never used smokeless tobacco. He reports that he does not drink alcohol  and does not use drugs.  Allergies  Allergen Reactions   Bee Venom Anaphylaxis   Yellow Jacket Venom Anaphylaxis   Crestor [Rosuvastatin] Other (See Comments)    "muscle weakness and discomfort"   Vytorin [Ezetimibe-Simvastatin] Other (See Comments)    "muscle weakness and discomfort"   Dynapen [Dicloxacillin] Rash   Jardiance [Empagliflozin] Rash    Yeast infection    Family History  Problem Relation Age of Onset   Cancer Mother 91       colon/resection     Prior to Admission medications   Medication Sig Start Date End Date Taking? Authorizing Provider  amLODipine (NORVASC) 5 MG tablet Take 5 mg by mouth every evening. 05/09/23  Yes [provider]  apixaban (ELIQUIS) 5 MG TABS tablet TAKE 1 TABLET(5 MG) BY MOUTH TWICE DAILY 10/01/19  Yes [provider]  Ascorbic Acid (VITAMIN C) 1000 MG tablet Take 1,000 mg by mouth 2 (two) times daily.   Yes [provider]  bisoprolol (ZEBETA) 5 MG tablet Take 5 mg by mouth every evening.   Yes [provider]  cyanocobalamin (VITAMIN B12) 1000 MCG tablet Take 1,000 mcg by mouth daily.   Yes [provider]  EPINEPHrine 0.3 mg/0.3 mL IJ SOAJ injection INJECT 0.3 MG INTO THE MUSCLE ONCE AS NEEDED FOR ANAPHYLAXIS 10/10/19  Yes [provider]  fluorouracil (EFUDEX) 5 % cream Apply 1 Application topically at bedtime. 01/20/24  Yes [provider]  furosemide  (LASIX ) 40 MG tablet Take 40 mg by mouth daily.   11/15/19  Yes [provider]  glimepiride  (AMARYL ) 2 MG tablet Take 2 mg by mouth 2 (two) times daily. 07/03/12  Yes [provider]  insulin glargine-yfgn (SEMGLEE) 100 UNIT/ML injection Inject 26 Units into the skin at bedtime.   Yes [provider]  KRILL OIL PO Take 1 capsule by mouth daily.   Yes [provider]  lisinopril  (ZESTRIL ) 20 MG tablet Take 20 mg by mouth 2 (two) times daily.   Yes [provider]  metFORMIN  (GLUCOPHAGE ) 500 MG tablet Take 500 mg by mouth 2 (two) times daily.   Yes [provider]  NOVOLOG FLEXPEN 100 UNIT/ML FlexPen Inject 16 Units into the skin 3 (three) times daily with meals. 08/02/22  Yes [provider]  omeprazole (PRILOSEC) 40 MG capsule Take 40 mg by mouth daily.   Yes [provider]  ondansetron  (ZOFRAN ) 4 MG tablet Take 1  tablet (4 mg total) by mouth every 8 (eight) hours as needed for vomiting or nausea. 02/10/24  Yes Janeth Medicus, MD  OVER THE COUNTER MEDICATION Take 2 capsules by mouth daily. Super Joint Support supplement   Yes [provider]  OVER THE COUNTER MEDICATION Take 1 capsule by mouth daily. CinnaChroma supplement   Yes [provider]  oxyCODONE  (ROXICODONE ) 5 MG immediate release tablet Take 1 tablet (5 mg total) by mouth every 6 (six) hours as needed for severe pain (pain score 7-10) or moderate pain (pain score 4-6). 02/10/24 02/09/25 Yes Janeth Medicus, MD  pioglitazone (ACTOS) 45 MG tablet Take 45 mg by mouth daily.   Yes [provider]  Red Yeast Rice 600 MG CAPS Take 600 mg by mouth 2 (two) times daily.   Yes [provider]  Testosterone  20.25 MG/ACT (1.62%) GEL Apply 1 pump to 1 shoulder and 2 pumps to the other shoulder daily (3 pumps total) 08/20/22  Yes Stoioff, Kizzie Perks, MD  ACCU-CHEK FASTCLIX LANCETS MISC  10/29/16   [provider]    Physical Exam: BP (!) 144/80 (BP Location: Left Arm)   Pulse 69    Temp 97.7 F (36.5 C)   Resp 16   Ht 5\' 9"  (1.753 m)   Wt 117.5 kg   SpO2 93%   BMI 38.25 kg/m  General:  Alert, oriented, calm, in no acute distress  Eyes: EOMI, clear conjuctivae, white sclerea Neck: supple, no masses, trachea mildline  Cardiovascular: RRR, no murmurs or rubs, she has 1-2+ pitting bilateral lower extremity edema, right calf is slightly larger than the left, nontender without any erythema Respiratory: clear to auscultation bilaterally, no wheezes, no crackles  Abdomen: soft, nontender, nondistended, normal bowel tones heard  Skin: dry, no rashes  Musculoskeletal: no joint effusions, normal range of motion, right arm in sling Psychiatric: appropriate affect, normal speech  Neurologic: extraocular muscles intact, clear speech, moving all extremities with intact sensorium         Recent Labs and Imaging Reviewed:  Basic Metabolic Panel: Recent Labs  Lab 02/11/24 0337 02/11/24 1403 02/12/24 0330  NA 134* 135 137  K 4.5 4.0 4.1  CL 99 101 102  CO2 22 24 25   GLUCOSE 288* 211* 240*  BUN 49* 48* 45*  CREATININE 2.57* 2.12* 2.44*  CALCIUM 8.9 8.7* 8.5*   Liver Function Tests: No results for input(s): "AST", "ALT", "ALKPHOS", "BILITOT", "PROT", "ALBUMIN" in the last 168 hours. No results for input(s): "LIPASE", "AMYLASE" in the last 168 hours. No results for input(s): "AMMONIA" in the last 168 hours. CBC: Recent Labs  Lab 02/11/24 0337  HGB 11.3*  HCT 35.4*   Cardiac Enzymes: No results for input(s): "CKTOTAL", "CKMB", "CKMBINDEX", "TROPONINI" in the last 168 hours.  BNP (last 3 results) No results for input(s): "BNP" in the last 8760 hours.  ProBNP (last 3 results) No results for input(s): "PROBNP" in the last 8760 hours.  CBG: Recent Labs  Lab 02/10/24 0606 02/10/24 0845 02/10/24 0930 02/10/24 1157  GLUCAP 147* 96 129* 196*    Radiological Exams on Admission: No results found.  Summary and Recommendations: Jeremiah Zhang is a 78  y.o. male with medical history significant for DVT on Eliquis, hypertension, insulin-dependent type 2 diabetes, OSA admitted to the orthopedic service for elective reverse right shoulder replacement who is now being seen in consultation by the medical service for acute kidney injury.  Acute kidney injury-suspect this is ATN from dehydration in  the setting of reduced oral intake due to recent surgery, and continued nephrotoxins including lisinopril  and Lasix . -Discontinue lisinopril , and Lasix  -Start gentle IV fluids for the next 24 hours -Check postvoid residual  Type 2 diabetes with hyperglycemia-overall his blood sugars are decently well-controlled, his last hemoglobin A1c earlier this month is 7.5.  Likely experiencing hyperglycemia postoperatively due to the stress of surgery. -Carb modified diet -Discontinue metformin  in the setting of AKI -Will resume his Premeal insulin, but at reduced dose of 10 units NovoLog 3 times daily with meals -Moderate dose sliding scale insulin  History of DVT-continue Eliquis, was resumed postoperatively on 4/26  Lower extremity edema-most likely due to reduced ambulation, though patient is ambulating independently today.  Due to his history of DVT and having held Eliquis appropriately for surgery, will check right lower extremity Doppler to rule out DVT.  Thank you for involving us  in the care of your patient.  [facility] will continue to follow along with you.    Code Status: Full Code  Time spent: 49 minutes  Platon Arocho Rickey Charm MD [facility] Pager [phone]  If 7PM-7AM, please contact night-coverage [email or web address] Password [facility]  02/12/2024, 1:36 PM

## 2024-02-13 ENCOUNTER — Encounter (HOSPITAL_COMMUNITY): Payer: Self-pay | Admitting: Orthopedic Surgery

## 2024-02-13 ENCOUNTER — Observation Stay (HOSPITAL_COMMUNITY)

## 2024-02-13 DIAGNOSIS — G4733 Obstructive sleep apnea (adult) (pediatric): Secondary | ICD-10-CM | POA: Diagnosis present

## 2024-02-13 DIAGNOSIS — Z79899 Other long term (current) drug therapy: Secondary | ICD-10-CM | POA: Diagnosis not present

## 2024-02-13 DIAGNOSIS — I82409 Acute embolism and thrombosis of unspecified deep veins of unspecified lower extremity: Secondary | ICD-10-CM | POA: Insufficient documentation

## 2024-02-13 DIAGNOSIS — Z96653 Presence of artificial knee joint, bilateral: Secondary | ICD-10-CM | POA: Diagnosis present

## 2024-02-13 DIAGNOSIS — Z91030 Bee allergy status: Secondary | ICD-10-CM | POA: Diagnosis not present

## 2024-02-13 DIAGNOSIS — Z86718 Personal history of other venous thrombosis and embolism: Secondary | ICD-10-CM | POA: Diagnosis not present

## 2024-02-13 DIAGNOSIS — M25811 Other specified joint disorders, right shoulder: Secondary | ICD-10-CM | POA: Diagnosis present

## 2024-02-13 DIAGNOSIS — E785 Hyperlipidemia, unspecified: Secondary | ICD-10-CM | POA: Diagnosis present

## 2024-02-13 DIAGNOSIS — N400 Enlarged prostate without lower urinary tract symptoms: Secondary | ICD-10-CM | POA: Diagnosis present

## 2024-02-13 DIAGNOSIS — E669 Obesity, unspecified: Secondary | ICD-10-CM | POA: Diagnosis present

## 2024-02-13 DIAGNOSIS — E1165 Type 2 diabetes mellitus with hyperglycemia: Secondary | ICD-10-CM | POA: Diagnosis present

## 2024-02-13 DIAGNOSIS — Z7901 Long term (current) use of anticoagulants: Secondary | ICD-10-CM | POA: Diagnosis not present

## 2024-02-13 DIAGNOSIS — K219 Gastro-esophageal reflux disease without esophagitis: Secondary | ICD-10-CM | POA: Diagnosis present

## 2024-02-13 DIAGNOSIS — Z8673 Personal history of transient ischemic attack (TIA), and cerebral infarction without residual deficits: Secondary | ICD-10-CM | POA: Diagnosis not present

## 2024-02-13 DIAGNOSIS — Z8546 Personal history of malignant neoplasm of prostate: Secondary | ICD-10-CM | POA: Diagnosis not present

## 2024-02-13 DIAGNOSIS — R609 Edema, unspecified: Secondary | ICD-10-CM | POA: Diagnosis not present

## 2024-02-13 DIAGNOSIS — Z794 Long term (current) use of insulin: Secondary | ICD-10-CM | POA: Diagnosis not present

## 2024-02-13 DIAGNOSIS — N189 Chronic kidney disease, unspecified: Secondary | ICD-10-CM | POA: Diagnosis not present

## 2024-02-13 DIAGNOSIS — Z6838 Body mass index (BMI) 38.0-38.9, adult: Secondary | ICD-10-CM | POA: Diagnosis not present

## 2024-02-13 DIAGNOSIS — Z923 Personal history of irradiation: Secondary | ICD-10-CM | POA: Diagnosis not present

## 2024-02-13 DIAGNOSIS — M19011 Primary osteoarthritis, right shoulder: Secondary | ICD-10-CM | POA: Diagnosis present

## 2024-02-13 DIAGNOSIS — N179 Acute kidney failure, unspecified: Secondary | ICD-10-CM | POA: Diagnosis not present

## 2024-02-13 DIAGNOSIS — Z888 Allergy status to other drugs, medicaments and biological substances status: Secondary | ICD-10-CM | POA: Diagnosis not present

## 2024-02-13 DIAGNOSIS — E1142 Type 2 diabetes mellitus with diabetic polyneuropathy: Secondary | ICD-10-CM | POA: Diagnosis present

## 2024-02-13 DIAGNOSIS — Z96611 Presence of right artificial shoulder joint: Secondary | ICD-10-CM | POA: Diagnosis not present

## 2024-02-13 DIAGNOSIS — Z85828 Personal history of other malignant neoplasm of skin: Secondary | ICD-10-CM | POA: Diagnosis not present

## 2024-02-13 DIAGNOSIS — I10 Essential (primary) hypertension: Secondary | ICD-10-CM | POA: Diagnosis present

## 2024-02-13 DIAGNOSIS — Z7984 Long term (current) use of oral hypoglycemic drugs: Secondary | ICD-10-CM | POA: Diagnosis not present

## 2024-02-13 DIAGNOSIS — M75101 Unspecified rotator cuff tear or rupture of right shoulder, not specified as traumatic: Secondary | ICD-10-CM | POA: Diagnosis present

## 2024-02-13 LAB — BASIC METABOLIC PANEL WITH GFR
Anion gap: 13 (ref 5–15)
BUN: 31 mg/dL — ABNORMAL HIGH (ref 8–23)
CO2: 23 mmol/L (ref 22–32)
Calcium: 8.5 mg/dL — ABNORMAL LOW (ref 8.9–10.3)
Chloride: 102 mmol/L (ref 98–111)
Creatinine, Ser: 1.71 mg/dL — ABNORMAL HIGH (ref 0.61–1.24)
GFR, Estimated: 40 mL/min — ABNORMAL LOW (ref >60)
Glucose, Bld: 261 mg/dL — ABNORMAL HIGH (ref 70–99)
Potassium: 3.8 mmol/L (ref 3.5–5.1)
Sodium: 138 mmol/L (ref 135–145)

## 2024-02-13 LAB — GLUCOSE, CAPILLARY
Glucose-Capillary: 236 mg/dL — ABNORMAL HIGH (ref 70–99)
Glucose-Capillary: 209 mg/dL — ABNORMAL HIGH (ref 70–99)

## 2024-02-13 MED ORDER — INSULIN GLARGINE-YFGN 100 UNIT/ML ~~LOC~~ SOLN
10.0000 [IU] | Freq: Two times a day (BID) | SUBCUTANEOUS | Status: DC
  Administered 2024-02-13: 10 [IU] via SUBCUTANEOUS
  Filled 2024-02-13 (×2): qty 0.1

## 2024-02-13 MED ORDER — POLYETHYLENE GLYCOL 3350 17 G PO PACK
17.0000 g | PACK | Freq: Two times a day (BID) | ORAL | Status: DC
  Administered 2024-02-13: 17 g via ORAL
  Filled 2024-02-13: qty 1

## 2024-02-13 NOTE — Progress Notes (Signed)
 Discharge paperwork printed and reviewed with the pt. No concerns made. Pt aware to pick up medications at [facility] pharmacy. Wife to provide transportation.

## 2024-02-13 NOTE — Progress Notes (Signed)
   02/13/24 0958  TOC Brief Assessment  Insurance and Status Reviewed  Patient has primary care physician Yes  Home environment has been reviewed resides in private residene with spouse  Prior level of function: Independent  Prior/Current Home Services No current home services  Social Drivers of Health Review SDOH reviewed no interventions necessary  Readmission risk has been reviewed Yes  Transition of care needs no transition of care needs at this time

## 2024-02-13 NOTE — Plan of Care (Signed)
   Problem: Education: Goal: Knowledge of General Education information will improve Description Including pain rating scale, medication(s)/side effects and non-pharmacologic comfort measures Outcome: Progressing

## 2024-02-13 NOTE — Progress Notes (Signed)
 [facility] CONSULT PROGRESS NOTE    Progress Note  Jeremiah Zhang  ZOX:[id number] DOB: [birth year] DOA: 02/10/2024 PCP: Nestor Banter, MD     Brief Narrative:   Jeremiah Holly Harten is an 78 y.o. male past medical history significant for DVT on Eliquis essential hypertension, diabetes mellitus type 2 obstructive sleep apnea to the orthopedic service for an elective right shoulder replacement we were consulted for acute kidney injury.  The day prior he was sleeping and not eating and drinking well.  Wife relates that his right leg looks larger than the left 1   Assessment/Plan:   Acute kidney injury: With a baseline creatinine around 1.1. In the setting of decreased oral intake and diuretic use. Started on IV fluids and has had good urine output. Suspect there is ATN. Continue IV fluids for an additional 24 hours. Monitor strict I's and O's and daily weights. Continue home Lasix  ACE inhibitor and metformin .  S/P reverse total shoulder arthroplasty, right Further management per orthopedic surgery.  Diabetes mellitus type 2 with hyperglycemia: Last A1c was 7.5 last month. Blood glucose significantly elevated. Will go ahead and hold metformin  Amaryl  as he has 24-hour half-life, in the setting of new acute kidney injury. Continue sliding scale insulin.  Add long-acting insulin. CBGs ACHS.  Essential hypertension: Continue to hold ACE inhibitor and diuretic therapy. Continue Norvasc, will allow some degree of permissive hypertension  Benign prostatic hyperplasia without lower urinary tract symptoms Noted.  History of DVT (deep venous thrombosis) (HCC) Eliquis was held in anticipation for surgical intervention.  Resume on 02/11/2023. Lower extremity Doppler has been sent.  Thank you for involving us  in his care we will continue to follow along with you.    DVT prophylaxis: eliquis Family Communication:wife Status is: Observation The patient remains OBS appropriate  and will d/c before 2 midnights.    Code Status:     Code Status Orders  (From admission, onward)           Start     Ordered   02/10/24 1143  Full code  Continuous       Question:  By:  Answer:  Consent: discussion documented in EHR   02/10/24 1142           Code Status History     This patient has a current code status but no historical code status.      Advance Directive Documentation    Flowsheet Row Most Recent Value  Type of Advance Directive Healthcare Power of Attorney  [Marie Krontz (wife)]  Pre-existing out of facility DNR order (yellow form or pink MOST form) --  "MOST" Form in Place? --         IV Access:   Peripheral IV   Procedures and diagnostic studies:   No results found.   Medical Consultants:   None.   Subjective:    Jeremiah Zhang feels better having bowel movements.  Objective:    Vitals:   02/12/24 0501 02/12/24 1404 02/12/24 2057 02/13/24 0535  BP: (!) 144/80 131/80 (!) 144/76 138/78  Pulse: 69 81 86 85  Resp: 16 17 17 18   Temp: 97.7 F (36.5 C) 98.8 F (37.1 C) 99.8 F (37.7 C) 98.7 F (37.1 C)  TempSrc:  Oral Oral Oral  SpO2: 93% 91% 93% 92%  Weight:      Height:       SpO2: 92 % O2 Flow Rate (L/min): 4 L/min   Intake/Output Summary (Last 24 hours) at 02/13/2024  6213 Last data filed at 02/13/2024 0159 Gross per 24 hour  Intake 584.81 ml  Output 2900 ml  Net -2315.19 ml   Filed Weights   02/10/24 0612  Weight: 117.5 kg    Exam: General exam: In no acute distress. Respiratory system: Good air movement and clear to auscultation. Cardiovascular system: S1 & S2 heard, RRR. No JVD. Gastrointestinal system: Abdomen is nondistended, soft and nontender.  Extremities: No pedal edema. Skin: No rashes, lesions or ulcers Psychiatry: Judgement and insight appear normal. Mood & affect appropriate.    Data Reviewed:    Labs: Basic Metabolic Panel: Recent Labs  Lab 02/11/24 0337 02/11/24 1403  02/12/24 0330  NA 134* 135 137  K 4.5 4.0 4.1  CL 99 101 102  CO2 22 24 25   GLUCOSE 288* 211* 240*  BUN 49* 48* 45*  CREATININE 2.57* 2.12* 2.44*  CALCIUM 8.9 8.7* 8.5*   GFR Estimated Creatinine Clearance: 31 mL/min (A) (by C-G formula based on SCr of 2.44 mg/dL (H)). Liver Function Tests: No results for input(s): "AST", "ALT", "ALKPHOS", "BILITOT", "PROT", "ALBUMIN" in the last 168 hours. No results for input(s): "LIPASE", "AMYLASE" in the last 168 hours. No results for input(s): "AMMONIA" in the last 168 hours. Coagulation profile No results for input(s): "INR", "PROTIME" in the last 168 hours. COVID-19 Labs  No results for input(s): "DDIMER", "FERRITIN", "LDH", "CRP" in the last 72 hours.  Lab Results  Component Value Date   SARSCOV2NAA NEGATIVE 05/01/2019    CBC: Recent Labs  Lab 02/11/24 0337  HGB 11.3*  HCT 35.4*   Cardiac Enzymes: No results for input(s): "CKTOTAL", "CKMB", "CKMBINDEX", "TROPONINI" in the last 168 hours. BNP (last 3 results) No results for input(s): "PROBNP" in the last 8760 hours. CBG: Recent Labs  Lab 02/10/24 0845 02/10/24 0930 02/10/24 1157 02/12/24 1722 02/12/24 2103  GLUCAP 96 129* 196* 205* 172*   D-Dimer: No results for input(s): "DDIMER" in the last 72 hours. Hgb A1c: No results for input(s): "HGBA1C" in the last 72 hours. Lipid Profile: No results for input(s): "CHOL", "HDL", "LDLCALC", "TRIG", "CHOLHDL", "LDLDIRECT" in the last 72 hours. Thyroid  function studies: No results for input(s): "TSH", "T4TOTAL", "T3FREE", "THYROIDAB" in the last 72 hours.  Invalid input(s): "FREET3" Anemia work up: No results for input(s): "VITAMINB12", "FOLATE", "FERRITIN", "TIBC", "IRON", "RETICCTPCT" in the last 72 hours. Sepsis Labs: No results for input(s): "PROCALCITON", "WBC", "LATICACIDVEN" in the last 168 hours. Microbiology No results found for this or any previous visit (from the past 240 hours).   Medications:    amLODipine   5 mg Oral QPM   apixaban  5 mg Oral BID   bisoprolol  5 mg Oral QPM   docusate sodium   100 mg Oral BID   glimepiride   2 mg Oral BID WC   insulin aspart  0-15 Units Subcutaneous TID WC   insulin aspart  0-5 Units Subcutaneous QHS   insulin aspart  10 Units Subcutaneous TID WC   pantoprazole  40 mg Oral Daily   Continuous Infusions:  sodium chloride  100 mL/hr at 02/13/24 0002      LOS: 0 days   Macdonald Savoy  [facility]  02/13/2024, 6:50 AM

## 2024-02-13 NOTE — Discharge Summary (Signed)
 Patient ID: WENTWORTH ALLSTON MRN: [id number] DOB/AGE: 80-May-1947 80 y.o.  Admit date: 02/10/2024 Discharge date: 02/13/2024  Primary Diagnosis: Right shoulder rotator cuff arthropathy  Admission Diagnoses: S/p reverse total shoulder arthroplasty, acute kidney injury.  Past Medical History:  Diagnosis Date   Actinic keratosis    Anticoagulant long-term use    ELIQUIS   Arthritis    Basal cell carcinoma 01/27/2010   Right spinal upper back. Superficial.    Basal cell carcinoma 11/15/2019   Left inferior antihelix post. to tragus. Superficial and nodular patterns. Excised: 11/27/2019   Basal cell carcinoma 11/15/2019   Left mid lateral nose. Nodular and infiltrative patterns. Excised: 11/27/2019   Basal cell carcinoma 11/27/2019   Left mid superior helix. BCC with focal sclerosis.   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 Slight atypia. Edges free.   Dysplastic nevus 06/30/2023   R flank, moderate atypia   ED (erectile dysfunction)    GERD (gastroesophageal reflux disease)    Hiatal hernia    History of DVT of lower extremity    POST OP KNEE SURGERY   History of transient ischemic attack (TIA)    PER PT HX  ?POSSIBLE-- PT ON ELIQUIS   Hyperlipidemia    Hypertension    Hypogonadism male    OSA (obstructive sleep apnea)    per pt positive sleep study yrs ago recommended CPAP --  pt Non- compliant -- states uses Breath Rite Nasal Strips   Peripheral neuropathy    Prostate cancer [facility]) urologist-  dr Isla Mari  oncologist-- dr Lorri Rota   dx 03-25-2015  T1c, Gleason 4+3  s/p  cryoablation prostate 06-02-2015/  external  radiation therapy 08-16-2016 to 09-20-2016/  scheduled for radioactive seed implants 10-22-2016   TIA (transient ischemic attack)    ?? 08/20/2014? Eliquis 07/02/15   Type 2 diabetes mellitus (HCC)    followed  by dr Nestor Banter [facility])  last A1c 8.4 on 07-27-2016 per lov note   Umbilical hernia    Wears glasses    Wears hearing aid    bilateral   Discharge Diagnoses:   Principal Problem:   S/P reverse total shoulder arthroplasty, right Active Problems:   Benign prostatic hyperplasia without lower urinary tract symptoms   Diabetes mellitus without complication (HCC)   DVT (deep venous thrombosis) (HCC)   Acute kidney injury superimposed on chronic kidney disease (HCC)  Estimated body mass index is 38.25 kg/m as calculated from the following:   Height as of this encounter: 5\' 9"  (1.753 m).   Weight as of this encounter: 117.5 kg.  Procedure:  Procedure(s) (LRB): ARTHROPLASTY, SHOULDER, TOTAL, REVERSE (Right)   Consults:  Internal medicine  HPI: ULYSSEE FRYBERGER  is a 78 y.o. male who complains of right shoulder pain and weakness.  Presented on 02/10/2024 fpr reverse arthroplasty for definitive treatment. He was admitted for post operative monitoring and OT.   Laboratory Data: Admission on 02/10/2024  Component Date Value Ref Range Status   Glucose-Capillary 02/10/2024 147 (H)  70 - 99 mg/dL Final   Glucose reference range applies only to samples taken after fasting for at least 8 hours.   Comment 1 02/10/2024 Notify RN   Final   Glucose-Capillary 02/10/2024 96  70 - 99 mg/dL Final   Glucose reference range applies only  to samples taken after fasting for at least 8 hours.   Glucose-Capillary 02/10/2024 129 (H)  70 - 99 mg/dL Final   Glucose reference range applies only to samples taken after fasting for at least 8 hours.   Glucose-Capillary 02/10/2024 196 (H)  70 - 99 mg/dL Final   Glucose reference range applies only to samples taken after fasting for at least 8 hours.   Hemoglobin 02/11/2024 11.3 (L)  13.0 - 17.0 g/dL Final   HCT 40/98/1191 35.4 (L)  39.0 - 52.0 % Final   Performed at [facility], [address], [place], [place] [address]   Sodium  02/11/2024 134 (L)  135 - 145 mmol/L Final   Potassium 02/11/2024 4.5  3.5 - 5.1 mmol/L Final   Chloride 02/11/2024 99  98 - 111 mmol/L Final   CO2 02/11/2024 22  22 - 32 mmol/L Final   Glucose, Bld 02/11/2024 288 (H)  70 - 99 mg/dL Final   Glucose reference range applies only to samples taken after fasting for at least 8 hours.   BUN 02/11/2024 49 (H)  8 - 23 mg/dL Final   Creatinine, Ser 02/11/2024 2.57 (H)  0.61 - 1.24 mg/dL Final   Calcium 56/21/3086 8.9  8.9 - 10.3 mg/dL Final   GFR, Estimated 02/11/2024 25 (L)  >60 mL/min Final   Comment: (NOTE) Calculated using the CKD-EPI Creatinine Equation (2021)    Anion gap 02/11/2024 13  5 - 15 Final   Performed at [facility], [address], [place], [place] [address]   Sodium 02/11/2024 135  135 - 145 mmol/L Final   Potassium 02/11/2024 4.0  3.5 - 5.1 mmol/L Final   Chloride 02/11/2024 101  98 - 111 mmol/L Final   CO2 02/11/2024 24  22 - 32 mmol/L Final   Glucose, Bld 02/11/2024 211 (H)  70 - 99 mg/dL Final   Glucose reference range applies only to samples taken after fasting for at least 8 hours.   BUN 02/11/2024 48 (H)  8 - 23 mg/dL Final   Creatinine, Ser 02/11/2024 2.12 (H)  0.61 - 1.24 mg/dL Final   Calcium 96/29/5284 8.7 (L)  8.9 - 10.3 mg/dL Final   GFR, Estimated 02/11/2024 31 (L)  >60 mL/min Final   Comment: (NOTE) Calculated using the CKD-EPI Creatinine Equation (2021)    Anion gap 02/11/2024 10  5 - 15 Final   Performed at [facility], [address] [place], [place] [address]   Sodium 02/12/2024 137  135 - 145 mmol/L Final   Potassium 02/12/2024 4.1  3.5 - 5.1 mmol/L Final   Chloride 02/12/2024 102  98 - 111 mmol/L Final   CO2 02/12/2024 25  22 - 32 mmol/L Final   Glucose, Bld 02/12/2024 240 (H)  70 - 99 mg/dL Final   Glucose reference range applies only to samples taken after fasting for at least 8 hours.   BUN 02/12/2024 45 (H)  8 - 23 mg/dL Final   Creatinine, Ser 02/12/2024  2.44 (H)  0.61 - 1.24 mg/dL Final   Calcium 10/20/7251 8.5 (L)  8.9 - 10.3 mg/dL Final   GFR, Estimated 02/12/2024 26 (L)  >60 mL/min Final   Comment: (NOTE) Calculated using the CKD-EPI Creatinine Equation (2021)    Anion gap 02/12/2024 10  5 - 15 Final   Performed at [facility], [address] [place], [place] [address]   Glucose-Capillary 02/12/2024 205 (H)  70 - 99 mg/dL  Final   Glucose reference range applies only to samples taken after fasting for at least 8 hours.   Glucose-Capillary 02/12/2024 172 (H)  70 - 99 mg/dL Final   Glucose reference range applies only to samples taken after fasting for at least 8 hours.   Sodium 02/13/2024 138  135 - 145 mmol/L Final   Potassium 02/13/2024 3.8  3.5 - 5.1 mmol/L Final   Chloride 02/13/2024 102  98 - 111 mmol/L Final   CO2 02/13/2024 23  22 - 32 mmol/L Final   Glucose, Bld 02/13/2024 261 (H)  70 - 99 mg/dL Final   Glucose reference range applies only to samples taken after fasting for at least 8 hours.   BUN 02/13/2024 31 (H)  8 - 23 mg/dL Final   Creatinine, Ser 02/13/2024 1.71 (H)  0.61 - 1.24 mg/dL Final   Calcium 96/29/5284 8.5 (L)  8.9 - 10.3 mg/dL Final   GFR, Estimated 02/13/2024 40 (L)  >60 mL/min Final   Comment: (NOTE) Calculated using the CKD-EPI Creatinine Equation (2021)    Anion gap 02/13/2024 13  5 - 15 Final   Performed at [facility], [address] [place], [place] [address]   Glucose-Capillary 02/13/2024 236 (H)  70 - 99 mg/dL Final   Glucose reference range applies only to samples taken after fasting for at least 8 hours.   Glucose-Capillary 02/13/2024 209 (H)  70 - 99 mg/dL Final   Glucose reference range applies only to samples taken after fasting for at least 8 hours.  Hospital Outpatient Visit on 01/31/2024  Component Date Value Ref Range Status   Hgb A1c MFr Bld 01/31/2024 7.5 (H)  4.8 - 5.6 % Final   Comment: (NOTE)         Prediabetes: 5.7 - 6.4         Diabetes:  >6.4         Glycemic control for adults with diabetes: <7.0    Mean Plasma Glucose 01/31/2024 169  mg/dL Final   Comment: (NOTE) Performed At: [facility] [address] [place], [place] [id number] Pearlean Botts MD UY:[id number]    Sodium 01/31/2024 141  135 - 145 mmol/L Final   Potassium 01/31/2024 4.0  3.5 - 5.1 mmol/L Final   Chloride 01/31/2024 106  98 - 111 mmol/L Final   CO2 01/31/2024 26  22 - 32 mmol/L Final   Glucose, Bld 01/31/2024 157 (H)  70 - 99 mg/dL Final   Glucose reference range applies only to samples taken after fasting for at least 8 hours.   BUN 01/31/2024 32 (H)  8 - 23 mg/dL Final   Creatinine, Ser 01/31/2024 1.90 (H)  0.61 - 1.24 mg/dL Final   Calcium 63/87/5643 9.2  8.9 - 10.3 mg/dL Final   GFR, Estimated 01/31/2024 35 (L)  >60 mL/min Final   Comment: (NOTE) Calculated using the CKD-EPI Creatinine Equation (2021)    Anion gap 01/31/2024 9  5 - 15 Final   Performed at [facility], [address] [place], [place] [address]   WBC 01/31/2024 5.2  4.0 - 10.5 K/uL Final   RBC 01/31/2024 3.89 (L)  4.22 - 5.81 MIL/uL Final   Hemoglobin 01/31/2024 12.9 (L)  13.0 - 17.0 g/dL Final   HCT 88/41/6606 38.8 (L)  39.0 - 52.0 % Final   MCV 01/31/2024 99.7  80.0 - 100.0 fL Final   MCH 01/31/2024 33.2  26.0 - 34.0 pg Final   MCHC 01/31/2024 33.2  30.0 - 36.0 g/dL Final   RDW 16/07/9603 14.3  11.5 - 15.5 % Final   Platelets 01/31/2024 270  150 - 400 K/uL Final   nRBC 01/31/2024 0.0  0.0 - 0.2 % Final   Performed at [facility], [address], [place], [place] [address]   MRSA, PCR 01/31/2024 NEGATIVE  NEGATIVE Final   Staphylococcus aureus 01/31/2024 NEGATIVE  NEGATIVE Final   Comment: (NOTE) The Xpert SA Assay (FDA approved for NASAL specimens in patients 18 years of age and older), is one component of a comprehensive surveillance program. It is not intended to diagnose infection nor to guide or monitor  treatment. Performed at [facility], [address] [place], [place] [address]    Glucose-Capillary 01/31/2024 163 (H)  70 - 99 mg/dL Final   Glucose reference range applies only to samples taken after fasting for at least 8 hours.  Appointment on 01/24/2024  Component Date Value Ref Range Status   Testosterone  01/24/2024 149 (L)  264 - 916 ng/dL Final   Comment: Adult male reference interval is based on a population of healthy nonobese males (BMI <30) between 19 and 18 years old. Travison, et.al. JCEM [phone]. PMID: [id number].      X-Rays:VAS US  LOWER EXTREMITY VENOUS (DVT) Result Date: 02/13/2024  Lower Venous DVT Study Patient Name:  WRYDER KEIZER [facility]  Date of Exam:   02/13/2024 Medical Rec #: [id number]         Accession #:    [id number] Date of Birth: 22-Feb-1946         Patient Gender: M Patient Age:   34 years Exam Location:  [facility] Procedure:      VAS US  LOWER EXTREMITY VENOUS (DVT) Referring Phys: MIR [facility] --------------------------------------------------------------------------------  Indications: Swelling.  Risk Factors: DVT RLE 07/04/2009. Anticoagulation: Eliquis. Limitations: Poor ultrasound/tissue interface. Comparison Study: Previous exam on 09/15/2009 - nonocclusive thrombus of RLE                   popliteal vein Performing Technologist: Jody Hill RVT, RDMS  Examination Guidelines: A complete evaluation includes B-mode imaging, spectral Doppler, color Doppler, and power Doppler as needed of all accessible portions of each vessel. Bilateral testing is considered an integral part of a complete examination. Limited examinations for reoccurring indications may be performed as noted. The reflux portion of the exam is performed with the patient in reverse Trendelenburg.  +---------+---------------+---------+-----------+----------+-------------------+ RIGHT    CompressibilityPhasicitySpontaneityPropertiesThrombus Aging       +---------+---------------+---------+-----------+----------+-------------------+ CFV      Full           Yes      Yes                                      +---------+---------------+---------+-----------+----------+-------------------+ SFJ      Full                                                             +---------+---------------+---------+-----------+----------+-------------------+ FV Prox  Full           Yes      Yes                                      +---------+---------------+---------+-----------+----------+-------------------+  FV Mid   Full           Yes      Yes                                      +---------+---------------+---------+-----------+----------+-------------------+ FV DistalFull           Yes      Yes                                      +---------+---------------+---------+-----------+----------+-------------------+ PFV      Full                                                             +---------+---------------+---------+-----------+----------+-------------------+ POP      Full           Yes      Yes                                      +---------+---------------+---------+-----------+----------+-------------------+ PTV      Full                                                             +---------+---------------+---------+-----------+----------+-------------------+ PERO     Full                                         Not well visualized +---------+---------------+---------+-----------+----------+-------------------+   +----+---------------+---------+-----------+----------+--------------+ LEFTCompressibilityPhasicitySpontaneityPropertiesThrombus Aging +----+---------------+---------+-----------+----------+--------------+ CFV Full           Yes      Yes                                 +----+---------------+---------+-----------+----------+--------------+     Summary: RIGHT: - There is no evidence of deep  vein thrombosis in the lower extremity.  - No cystic structure found in the popliteal fossa.  LEFT: - No evidence of common femoral vein obstruction.   *See table(s) above for measurements and observations. Electronically signed by Irvin Mantel on 02/13/2024 at 2:18:53 PM.    Final    DG Shoulder Right Port Result Date: 02/10/2024 CLINICAL DATA:  Status post reverse total right shoulder arthroplasty. Postoperative. EXAM: RIGHT SHOULDER - 1 VIEW COMPARISON:  Right shoulder radiographs 11/01/2022 FINDINGS: Interval reverse total right shoulder arthroplasty. No perihardware lucency is seen to indicate hardware failure or loosening. Expected postoperative changes including subacromial/subdeltoid and subcutaneous air. Mild acromioclavicular peripheral degenerative osteophytosis. No acute fracture or dislocation. IMPRESSION: Interval reverse total right shoulder arthroplasty without evidence of hardware failure. Electronically Signed   By: Bertina Broccoli M.D.   On: 02/10/2024 14:21    EKG: Orders placed or performed during the hospital encounter of 10/19/22   ED EKG  ED EKG   EKG     Hospital Course: DAYMON BUCHHOLTZ is a 78 y.o. who was admitted to Hospital. They were brought to the operating room on 02/10/2024 and underwent Procedure(s): ARTHROPLASTY, SHOULDER, TOTAL, REVERSE.  Patient tolerated the procedure well and was later transferred to the recovery room and then to the orthopaedic floor for postoperative care.  They were given PO and IV analgesics for pain control following their surgery.  They were given 24 hours of postoperative antibiotics of  Anti-infectives (From admission, onward)    Start     Dose/Rate Route Frequency Ordered Stop   02/10/24 0804  vancomycin  (VANCOCIN ) powder  Status:  Discontinued          As needed 02/10/24 0804 02/10/24 1139   02/10/24 0600  ceFAZolin  (ANCEF ) IVPB 2g/100 mL premix        2 g 200 mL/hr over 30 Minutes Intravenous On call to O.R. 02/10/24 [id number]  02/10/24 0748      and started on DVT prophylaxis in the form of  eliquis .   OT was ordered for total joint protocol.  Discharge planning consulted to help with postop disposition and equipment needs.  Patient had an uneventful  night on the evening of surgery.  They started to get up OOB with therapy on day one. .  Continued to work with therapy into day two.  Patient was noted to have an acute kidney injury. Internal medicine consulted for treatment. Labs improved with adjustments and interventions. .Also had some calf swelling which was evaluated with an ultrasound that was negative for DVT .  Patient was seen in rounds and was ready to go home. Internal medicine advised to stop ACEI for 2 weeks and diuretics for 2-3 days.    Diet: Regular diet Activity:NWB Follow-up:in 2 weeks Disposition - Home Discharged Condition: good   Discharge Instructions     Call MD / Call 911   Complete by: As directed    If you experience chest pain or shortness of breath, CALL 911 and be transported to the hospital emergency room.  If you develope a fever above 101 F, pus (white drainage) or increased drainage or redness at the wound, or calf pain, call your surgeon's office.   Constipation Prevention   Complete by: As directed    Drink plenty of fluids.  Prune juice may be helpful.  You may use a stool softener, such as Colace (over the counter) 100 mg twice a day.  Use MiraLax (over the counter) for constipation as needed.   Diet - low sodium heart healthy   Complete by: As directed    Increase activity slowly as tolerated   Complete by: As directed    Post-operative opioid taper instructions:   Complete by: As directed    POST-OPERATIVE OPIOID TAPER INSTRUCTIONS: It is important to wean off of your opioid medication as soon as possible. If you do not need pain medication after your surgery it is ok to stop day one. Opioids include: Codeine, Hydrocodone(Norco, Vicodin), Oxycodone (Percocet, oxycontin )  and hydromorphone  amongst others.  Long term and even short term use of opiods can cause: Increased pain response Dependence Constipation Depression Respiratory depression And more.  Withdrawal symptoms can include Flu like symptoms Nausea, vomiting And more Techniques to manage these symptoms Hydrate well Eat regular healthy meals Stay active Use relaxation techniques(deep breathing, meditating, yoga) Do Not substitute Alcohol  to help with tapering If you have been on opioids for less than two weeks  and do not have pain than it is ok to stop all together.  Plan to wean off of opioids This plan should start within one week post op of your joint replacement. Maintain the same interval or time between taking each dose and first decrease the dose.  Cut the total daily intake of opioids by one tablet each day Next start to increase the time between doses. The last dose that should be eliminated is the evening dose.         Allergies as of 02/13/2024       Reactions   Bee Venom Anaphylaxis   Yellow Jacket Venom Anaphylaxis   Crestor [rosuvastatin] Other (See Comments)   "muscle weakness and discomfort"   Vytorin [ezetimibe-simvastatin] Other (See Comments)   "muscle weakness and discomfort"   Dynapen [dicloxacillin] Rash   Jardiance [empagliflozin] Rash   Yeast infection        Medication List     PAUSE taking these medications    furosemide  40 MG tablet Wait to take this until: February 15, 2024 Commonly known as: LASIX  Take 40 mg by mouth daily.   lisinopril  20 MG tablet Wait to take this until: Feb 27, 2024 Stop taking for 2 weeks , then resume as prescribed.  Commonly known as: ZESTRIL  Take 20 mg by mouth 2 (two) times daily.       TAKE these medications    Accu-Chek FastClix Lancets Misc   amLODipine 5 MG tablet Commonly known as: NORVASC Take 5 mg by mouth every evening.   apixaban 5 MG Tabs tablet Commonly known as: ELIQUIS TAKE 1 TABLET(5 MG)  BY MOUTH TWICE DAILY   bisoprolol 5 MG tablet Commonly known as: ZEBETA Take 5 mg by mouth every evening.   cyanocobalamin 1000 MCG tablet Commonly known as: VITAMIN B12 Take 1,000 mcg by mouth daily.   EPINEPHrine 0.3 mg/0.3 mL Soaj injection Commonly known as: EPI-PEN INJECT 0.3 MG INTO THE MUSCLE ONCE AS NEEDED FOR ANAPHYLAXIS   fluorouracil 5 % cream Commonly known as: EFUDEX Apply 1 Application topically at bedtime.   glimepiride  2 MG tablet Commonly known as: AMARYL  Take 2 mg by mouth 2 (two) times daily.   insulin glargine-yfgn 100 UNIT/ML injection Commonly known as: SEMGLEE Inject 26 Units into the skin at bedtime.   KRILL OIL PO Take 1 capsule by mouth daily.   metFORMIN  500 MG tablet Commonly known as: GLUCOPHAGE  Take 500 mg by mouth 2 (two) times daily.   NovoLOG FlexPen 100 UNIT/ML FlexPen Generic drug: insulin aspart Inject 16 Units into the skin 3 (three) times daily with meals.   omeprazole 40 MG capsule Commonly known as: PRILOSEC Take 40 mg by mouth daily.   ondansetron  4 MG tablet Commonly known as: Zofran  Take 1 tablet (4 mg total) by mouth every 8 (eight) hours as needed for vomiting or nausea.   OVER THE COUNTER MEDICATION Take 2 capsules by mouth daily. Super Joint Support supplement   OVER THE COUNTER MEDICATION Take 1 capsule by mouth daily. CinnaChroma supplement   oxyCODONE  5 MG immediate release tablet Commonly known as: Roxicodone  Take 1 tablet (5 mg total) by mouth every 6 (six) hours as needed for severe pain (pain score 7-10) or moderate pain (pain score 4-6).   pioglitazone 45 MG tablet Commonly known as: ACTOS Take 45 mg by mouth daily.   Red Yeast Rice 600 MG Caps Take 600 mg by mouth 2 (two) times daily.   Testosterone  20.25 MG/ACT (1.62%) Gel Apply  1 pump to 1 shoulder and 2 pumps to the other shoulder daily (3 pumps total)   vitamin C 1000 MG tablet Take 1,000 mg by mouth 2 (two) times daily.        Follow-up  Information     Janeth Medicus, MD Follow up in 2 week(s).   Specialty: Orthopedic Surgery Why: For wound re-check Contact information: [address] [place] [address] [place] [address] [phone]                 Signed: Karyl Paget PA-C Orthopaedic Surgery 02/13/2024, 3:58 PM

## 2024-02-13 NOTE — Progress Notes (Signed)
 RLE venous duplex has been completed.  Results can be found under chart review under CV PROC. 02/13/2024 11:29 AM Naethan Bracewell RVT, RDMS

## 2024-02-13 NOTE — Plan of Care (Signed)
  Problem: Activity: Goal: Risk for activity intolerance will decrease Outcome: Progressing   Problem: Nutrition: Goal: Adequate nutrition will be maintained Outcome: Progressing   Problem: Coping: Goal: Level of anxiety will decrease Outcome: Progressing   Problem: Elimination: Goal: Will not experience complications related to bowel motility Outcome: Progressing Goal: Will not experience complications related to urinary retention Outcome: Progressing   Problem: Pain Managment: Goal: General experience of comfort will improve and/or be controlled Outcome: Progressing   Problem: Safety: Goal: Ability to remain free from injury will improve Outcome: Progressing   Problem: Nutritional: Goal: Maintenance of adequate nutrition will improve Outcome: Progressing   Problem: Skin Integrity: Goal: Risk for impaired skin integrity will decrease Outcome: Progressing

## 2024-03-15 ENCOUNTER — Encounter: Payer: Self-pay | Admitting: Urology

## 2024-03-15 ENCOUNTER — Other Ambulatory Visit: Payer: Self-pay | Admitting: Urology

## 2024-03-15 DIAGNOSIS — R944 Abnormal results of kidney function studies: Secondary | ICD-10-CM

## 2024-03-15 DIAGNOSIS — M545 Low back pain, unspecified: Secondary | ICD-10-CM

## 2024-03-27 ENCOUNTER — Ambulatory Visit
Admission: RE | Admit: 2024-03-27 | Discharge: 2024-03-27 | Disposition: A | Discharge status: Home / Self Care | Source: Ambulatory Visit | Attending: Urology | Admitting: Urology

## 2024-03-27 DIAGNOSIS — R944 Abnormal results of kidney function studies: Secondary | ICD-10-CM | POA: Insufficient documentation

## 2024-03-27 DIAGNOSIS — M545 Low back pain, unspecified: Secondary | ICD-10-CM | POA: Diagnosis not present

## 2024-04-02 ENCOUNTER — Ambulatory Visit: Payer: Self-pay | Admitting: Urology

## 2024-04-12 ENCOUNTER — Ambulatory Visit: Admitting: Podiatry

## 2024-04-12 DIAGNOSIS — E1149 Type 2 diabetes mellitus with other diabetic neurological complication: Secondary | ICD-10-CM

## 2024-04-12 DIAGNOSIS — B351 Tinea unguium: Secondary | ICD-10-CM

## 2024-04-12 NOTE — Patient Instructions (Signed)
 You can use UREA NAIL GEL on the thick toenails to help thin them. If this does not work, or if symptoms change we can remove them if you would like but it is not a guarantee that they will come back better.

## 2024-04-15 NOTE — Progress Notes (Signed)
 Subjective: Chief Complaint  Patient presents with   Nail Problem    RM#12 Thickening of second toes on both feet unable to tri them at home.Patient is on blood thinners at this time.    78 y.o. returns the office today for painful, elongated, thickened toenails which he cannot trim himself.  He states his affected toenails in particular are quite thick.  He presents today for evaluation possible removal. They do not cause any pain.  He does not notice any swelling or redness or any drainage.  No open lesions.  Last A1c was 8.3 on 03/06/2024  Objective: AAO 3, NAD DP/PT pulses palpable, CRT less than 3 seconds Nails hypertrophic, dystrophic, elongated, brittle, discolored 10.  In particular the second digit nails are the most hypertrophic and dystrophic.  There is no pain to the nails today.  There is no edema, erythema or signs of infection.  No open lesions. No pain with calf compression, swelling, warmth, erythema.  Assessment: Patient presents with symptomatic onychomycosis, onychodystrophy  Plan: -Treatment options including alternatives, risks, complications were discussed -We discussed nonreversible conservative as well as surgical options.  All of his secondary to his nails are quite thick they are not causing any pain or any signs of infection.  We discussed nail removal but this is not a guarantee they will come back into normal and well times to grow back in the same way or possibly even more thick.  If he wants to have them removed we can do this.  Today I sharply debrided the nails without any complications or bleeding.  Discussed urea nail gel to help with the thinning of the toenails.  Return in about 3 months (around 07/13/2024).  Donnice JONELLE Fees DPM

## 2024-05-13 ENCOUNTER — Encounter: Payer: Self-pay | Admitting: Internal Medicine

## 2024-05-13 ENCOUNTER — Emergency Department

## 2024-05-13 ENCOUNTER — Observation Stay
Admission: EM | Admit: 2024-05-13 | Discharge: 2024-05-14 | Disposition: A | Discharge status: Home / Self Care | Attending: Osteopathic Medicine | Admitting: Osteopathic Medicine

## 2024-05-13 ENCOUNTER — Other Ambulatory Visit: Payer: Self-pay

## 2024-05-13 DIAGNOSIS — Z8673 Personal history of transient ischemic attack (TIA), and cerebral infarction without residual deficits: Secondary | ICD-10-CM | POA: Insufficient documentation

## 2024-05-13 DIAGNOSIS — Z7984 Long term (current) use of oral hypoglycemic drugs: Secondary | ICD-10-CM | POA: Diagnosis not present

## 2024-05-13 DIAGNOSIS — E1140 Type 2 diabetes mellitus with diabetic neuropathy, unspecified: Secondary | ICD-10-CM | POA: Diagnosis not present

## 2024-05-13 DIAGNOSIS — Z96653 Presence of artificial knee joint, bilateral: Secondary | ICD-10-CM | POA: Insufficient documentation

## 2024-05-13 DIAGNOSIS — Z7901 Long term (current) use of anticoagulants: Secondary | ICD-10-CM | POA: Insufficient documentation

## 2024-05-13 DIAGNOSIS — I5030 Unspecified diastolic (congestive) heart failure: Secondary | ICD-10-CM | POA: Diagnosis not present

## 2024-05-13 DIAGNOSIS — Z85828 Personal history of other malignant neoplasm of skin: Secondary | ICD-10-CM | POA: Insufficient documentation

## 2024-05-13 DIAGNOSIS — E1122 Type 2 diabetes mellitus with diabetic chronic kidney disease: Secondary | ICD-10-CM

## 2024-05-13 DIAGNOSIS — N1832 Chronic kidney disease, stage 3b: Secondary | ICD-10-CM | POA: Insufficient documentation

## 2024-05-13 DIAGNOSIS — I82411 Acute embolism and thrombosis of right femoral vein: Principal | ICD-10-CM | POA: Insufficient documentation

## 2024-05-13 DIAGNOSIS — M79661 Pain in right lower leg: Secondary | ICD-10-CM | POA: Diagnosis present

## 2024-05-13 DIAGNOSIS — I82409 Acute embolism and thrombosis of unspecified deep veins of unspecified lower extremity: Secondary | ICD-10-CM | POA: Diagnosis present

## 2024-05-13 DIAGNOSIS — Z79899 Other long term (current) drug therapy: Secondary | ICD-10-CM | POA: Insufficient documentation

## 2024-05-13 DIAGNOSIS — Z6836 Body mass index (BMI) 36.0-36.9, adult: Secondary | ICD-10-CM | POA: Insufficient documentation

## 2024-05-13 DIAGNOSIS — Z8546 Personal history of malignant neoplasm of prostate: Secondary | ICD-10-CM | POA: Diagnosis not present

## 2024-05-13 DIAGNOSIS — I130 Hypertensive heart and chronic kidney disease with heart failure and stage 1 through stage 4 chronic kidney disease, or unspecified chronic kidney disease: Secondary | ICD-10-CM | POA: Diagnosis not present

## 2024-05-13 LAB — COMPREHENSIVE METABOLIC PANEL WITH GFR
ALT: 18 U/L (ref 0–44)
AST: 20 U/L (ref 15–41)
Albumin: 3.7 g/dL (ref 3.5–5.0)
Alkaline Phosphatase: 65 U/L (ref 38–126)
Anion gap: 11 (ref 5–15)
BUN: 32 mg/dL — ABNORMAL HIGH (ref 8–23)
CO2: 25 mmol/L (ref 22–32)
Calcium: 9.3 mg/dL (ref 8.9–10.3)
Chloride: 104 mmol/L (ref 98–111)
Creatinine, Ser: 1.9 mg/dL — ABNORMAL HIGH (ref 0.61–1.24)
GFR, Estimated: 35 mL/min — ABNORMAL LOW (ref >60)
Glucose, Bld: 173 mg/dL — ABNORMAL HIGH (ref 70–99)
Potassium: 3.7 mmol/L (ref 3.5–5.1)
Sodium: 140 mmol/L (ref 135–145)
Total Bilirubin: 0.8 mg/dL (ref 0.0–1.2)
Total Protein: 6.7 g/dL (ref 6.5–8.1)

## 2024-05-13 LAB — CBC WITH DIFFERENTIAL/PLATELET
Abs Immature Granulocytes: 0.01 K/uL (ref 0.00–0.07)
Basophils Absolute: 0 K/uL (ref 0.0–0.1)
Basophils Relative: 1 %
Eosinophils Absolute: 0.2 K/uL (ref 0.0–0.5)
Eosinophils Relative: 4 %
HCT: 36 % — ABNORMAL LOW (ref 39.0–52.0)
Hemoglobin: 12.6 g/dL — ABNORMAL LOW (ref 13.0–17.0)
Immature Granulocytes: 0 %
Lymphocytes Relative: 38 %
Lymphs Abs: 1.8 K/uL (ref 0.7–4.0)
MCH: 32.6 pg (ref 26.0–34.0)
MCHC: 35 g/dL (ref 30.0–36.0)
MCV: 93 fL (ref 80.0–100.0)
Monocytes Absolute: 0.5 K/uL (ref 0.1–1.0)
Monocytes Relative: 11 %
Neutro Abs: 2.2 K/uL (ref 1.7–7.7)
Neutrophils Relative %: 46 %
Platelets: 272 K/uL (ref 150–400)
RBC: 3.87 MIL/uL — ABNORMAL LOW (ref 4.22–5.81)
RDW: 14 % (ref 11.5–15.5)
WBC: 4.8 K/uL (ref 4.0–10.5)
nRBC: 0 % (ref 0.0–0.2)

## 2024-05-13 LAB — MAGNESIUM: Magnesium: 2.2 mg/dL (ref 1.7–2.4)

## 2024-05-13 LAB — APTT
aPTT: 31 s (ref 24–36)
aPTT: 62 s — ABNORMAL HIGH (ref 24–36)

## 2024-05-13 LAB — PROTIME-INR
INR: 1.1 (ref 0.8–1.2)
Prothrombin Time: 14.4 s (ref 11.4–15.2)

## 2024-05-13 LAB — GLUCOSE, CAPILLARY
Glucose-Capillary: 173 mg/dL — ABNORMAL HIGH (ref 70–99)
Glucose-Capillary: 177 mg/dL — ABNORMAL HIGH (ref 70–99)
Glucose-Capillary: 178 mg/dL — ABNORMAL HIGH (ref 70–99)

## 2024-05-13 LAB — BRAIN NATRIURETIC PEPTIDE: B Natriuretic Peptide: 40.8 pg/mL (ref 0.0–100.0)

## 2024-05-13 LAB — TROPONIN I (HIGH SENSITIVITY): Troponin I (High Sensitivity): 8 ng/L (ref <18)

## 2024-05-13 MED ORDER — HEPARIN SODIUM (PORCINE) 5000 UNIT/ML IJ SOLN
4000.0000 [IU] | Freq: Once | INTRAMUSCULAR | Status: DC
  Filled 2024-05-13: qty 1

## 2024-05-13 MED ORDER — BISOPROLOL FUMARATE 5 MG PO TABS
10.0000 mg | ORAL_TABLET | Freq: Every evening | ORAL | Status: DC
  Administered 2024-05-13: 10 mg via ORAL
  Filled 2024-05-13 (×3): qty 2

## 2024-05-13 MED ORDER — HEPARIN BOLUS VIA INFUSION
1400.0000 [IU] | Freq: Once | INTRAVENOUS | Status: AC
  Administered 2024-05-13: 1400 [IU] via INTRAVENOUS
  Filled 2024-05-13: qty 1400

## 2024-05-13 MED ORDER — BISOPROLOL FUMARATE 5 MG PO TABS
5.0000 mg | ORAL_TABLET | Freq: Every evening | ORAL | Status: DC
  Filled 2024-05-13: qty 1

## 2024-05-13 MED ORDER — INSULIN ASPART 100 UNIT/ML IJ SOLN
0.0000 [IU] | Freq: Three times a day (TID) | INTRAMUSCULAR | Status: DC
  Administered 2024-05-13 (×2): 2 [IU] via SUBCUTANEOUS
  Administered 2024-05-14 (×2): 1 [IU] via SUBCUTANEOUS
  Filled 2024-05-13 (×4): qty 1

## 2024-05-13 MED ORDER — ORAL CARE MOUTH RINSE: 15.0000 mL | OROMUCOSAL | Status: DC | PRN

## 2024-05-13 MED ORDER — LISINOPRIL 10 MG PO TABS
20.0000 mg | ORAL_TABLET | Freq: Two times a day (BID) | ORAL | Status: DC
Start: 2024-05-13 — End: 2024-05-13

## 2024-05-13 MED ORDER — PIOGLITAZONE HCL 30 MG PO TABS
45.0000 mg | ORAL_TABLET | Freq: Every day | ORAL | Status: DC
  Administered 2024-05-13 – 2024-05-14 (×2): 45 mg via ORAL
  Filled 2024-05-13 (×2): qty 1

## 2024-05-13 MED ORDER — INSULIN ASPART 100 UNIT/ML IJ SOLN: 0.0000 [IU] | Freq: Every day | INTRAMUSCULAR | Status: DC

## 2024-05-13 MED ORDER — FUROSEMIDE 40 MG PO TABS
40.0000 mg | ORAL_TABLET | Freq: Every day | ORAL | Status: DC
  Administered 2024-05-13 – 2024-05-14 (×2): 40 mg via ORAL
  Filled 2024-05-13 (×2): qty 1

## 2024-05-13 MED ORDER — GLIMEPIRIDE 2 MG PO TABS
2.0000 mg | ORAL_TABLET | Freq: Two times a day (BID) | ORAL | Status: DC
  Administered 2024-05-13 – 2024-05-14 (×2): 2 mg via ORAL
  Filled 2024-05-13 (×3): qty 1

## 2024-05-13 MED ORDER — AMLODIPINE BESYLATE 5 MG PO TABS
5.0000 mg | ORAL_TABLET | Freq: Every evening | ORAL | Status: DC
  Administered 2024-05-13: 5 mg via ORAL
  Filled 2024-05-13: qty 1

## 2024-05-13 MED ORDER — METFORMIN HCL 500 MG PO TABS
500.0000 mg | ORAL_TABLET | Freq: Two times a day (BID) | ORAL | Status: DC
  Administered 2024-05-13 – 2024-05-14 (×2): 500 mg via ORAL
  Filled 2024-05-13 (×2): qty 1

## 2024-05-13 MED ORDER — INSULIN GLARGINE-YFGN 100 UNIT/ML ~~LOC~~ SOLN
15.0000 [IU] | Freq: Every day | SUBCUTANEOUS | Status: DC
  Administered 2024-05-13: 15 [IU] via SUBCUTANEOUS
  Filled 2024-05-13 (×2): qty 0.15

## 2024-05-13 MED ORDER — LISINOPRIL 20 MG PO TABS
20.0000 mg | ORAL_TABLET | Freq: Every day | ORAL | Status: DC
  Administered 2024-05-13 – 2024-05-14 (×2): 20 mg via ORAL
  Filled 2024-05-13 (×2): qty 1

## 2024-05-13 MED ORDER — OXYCODONE HCL 5 MG PO TABS: 5.0000 mg | ORAL_TABLET | Freq: Four times a day (QID) | ORAL | Status: DC | PRN

## 2024-05-13 MED ORDER — PANTOPRAZOLE SODIUM 40 MG PO TBEC
40.0000 mg | DELAYED_RELEASE_TABLET | Freq: Every day | ORAL | Status: DC
  Administered 2024-05-13 – 2024-05-14 (×2): 40 mg via ORAL
  Filled 2024-05-13 (×2): qty 1

## 2024-05-13 MED ORDER — HEPARIN (PORCINE) 25000 UT/250ML-% IV SOLN
1900.0000 [IU]/h | INTRAVENOUS | Status: DC
  Administered 2024-05-13: 1900 [IU]/h via INTRAVENOUS
  Administered 2024-05-13: 1700 [IU]/h via INTRAVENOUS
  Filled 2024-05-13 (×3): qty 250

## 2024-05-13 MED ORDER — ONDANSETRON HCL 4 MG PO TABS: 4.0000 mg | ORAL_TABLET | Freq: Three times a day (TID) | ORAL | Status: DC | PRN

## 2024-05-13 NOTE — Progress Notes (Signed)
 PHARMACY - ANTICOAGULATION CONSULT NOTE  Pharmacy Consult for heparin  infusion Indication: DVT  Allergies  Allergen Reactions   Bee Venom Anaphylaxis   Yellow Jacket Venom Anaphylaxis   Crestor [Rosuvastatin] Other (See Comments)    muscle weakness and discomfort   Vytorin [Ezetimibe-Simvastatin] Other (See Comments)    muscle weakness and discomfort   Dynapen [Dicloxacillin] Rash   Jardiance [Empagliflozin] Rash    Yeast infection    Patient Measurements: Height: 5' 9 (175.3 cm) Weight: 111.1 kg (245 lb) IBW/kg (Calculated) : 70.7 HEPARIN  DW (KG): 95.2  Vital Signs: Temp: 99 F (37.2 C) (07/27 0557) Temp Source: Oral (07/27 0557) BP: 137/73 (07/27 0926) Pulse Rate: 65 (07/27 0926)  Labs: Recent Labs    05/13/24 0559 05/13/24 0604 05/13/24 1556  HGB 12.6*  --   --   HCT 36.0*  --   --   PLT 272  --   --   APTT  --  31 62*  LABPROT 14.4  --   --   INR 1.1  --   --   CREATININE 1.90*  --   --   TROPONINIHS 8  --   --     Estimated Creatinine Clearance: 38.1 mL/min (A) (by C-G formula based on SCr of 1.9 mg/dL (H)).   Medical History: Past Medical History:  Diagnosis Date   Actinic keratosis    Anticoagulant long-term use    ELIQUIS    Arthritis    Basal cell carcinoma 01/27/2010   Right spinal upper back. Superficial.    Basal cell carcinoma 11/15/2019   Left inferior antihelix post. to tragus. Superficial and nodular patterns. Excised: 11/27/2019   Basal cell carcinoma 11/15/2019   Left mid lateral nose. Nodular and infiltrative patterns. Excised: 11/27/2019   Basal cell carcinoma 11/27/2019   Left mid superior helix. BCC with focal sclerosis.   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 Slight atypia. Edges free.   Dysplastic nevus 06/30/2023   R flank, moderate atypia   ED (erectile  dysfunction)    GERD (gastroesophageal reflux disease)    Hiatal hernia    History of DVT of lower extremity    POST OP KNEE SURGERY   History of transient ischemic attack (TIA)    PER PT HX  ?POSSIBLE-- PT ON ELIQUIS    Hyperlipidemia    Hypertension    Hypogonadism male    OSA (obstructive sleep apnea)    per pt positive sleep study yrs ago recommended CPAP --  pt Non- compliant -- states uses Breath Rite Nasal Strips   Peripheral neuropathy    Prostate cancer [facility]) urologist-  dr jannette  oncologist-- dr patrcia   dx 03-25-2015  T1c, Gleason 4+3  s/p  cryoablation prostate 06-02-2015/  external  radiation therapy 08-16-2016 to 09-20-2016/  scheduled for radioactive seed implants 10-22-2016   TIA (transient ischemic attack)    ?? 08/20/2014? Eliquis  07/02/15   Type 2 diabetes mellitus (HCC)    followed by dr jerona sayre [facility])  last A1c 8.4 on 07-27-2016 per lov note   Umbilical hernia    Wears glasses    Wears hearing aid    bilateral    Assessment:  78 y.o. male with past medical history of type 2 diabetes, hypertension, end-stage renal disease with a history of  prostate cancer and prior DVT on Eliquis  who presents with 3 days of right calf pain. Venous US  shows nonocclusive DVT in the left femoral vein. Last apixaban  dose last night   Baseline Labs: Hgb 12.6, PLT wnl, INR 1.1, aPTT pending  Date Time HL/aPTT Rate/Comment 07/27 1556 - / 62  Slightly subtherapeutic  Goal of Therapy:  anti-Xa level  0.3-0.7 units/ml aPTT 66 - 102 seconds Monitor platelets by anticoagulation protocol: Yes   Plan:  Give heparin  bolus of 1400 units x1 Increase  heparin  infusion at 1900 units/hr  Check aPTT level in 8 hours (we will use aPTT to guide therapy until aPTT and anti-Xa level correlate) Continue to monitor H&H and platelets  Thank you for involving pharmacy in this patient's care.   Damien Napoleon, PharmD Clinical Pharmacist 05/13/2024 4:17 PM

## 2024-05-13 NOTE — ED Notes (Signed)
 Will do u/s IV. Poor peripheral vasculature.

## 2024-05-13 NOTE — ED Notes (Signed)
 Advised nurse that patient has ready bed

## 2024-05-13 NOTE — H&P (Addendum)
 History and Physical    Jeremiah Zhang FMW:[id number] DOB: [birth year] DOA: 05/13/2024  PCP: Diedra Lame, MD (Confirm with patient/family/NH records and if not entered, this has to be entered at [facility] point of entry) Patient coming from: Home  I have personally briefly reviewed patient's old medical records in [facility]  Chief Complaint: Right calf swelling and pain  HPI: Jeremiah Zhang is a 78 y.o. male with medical history significant of DVT on Eliquis , HTN, IDDM, HLD, morbid obesity, CKD stage IIIb, GERD, multiple joint OA, presented with new onset of right calf pain.  Patient was diagnosed with DVT 10+ years ago has been on Eliquis  and stable.  2 weeks ago he traveled to [place]  by driving, he said himself and his wife had to take turns to drive for no more than 2 hours for the long trip and had no issues coming back 1 week ago.  3-4 days ago, he started develop intermittent cramping of right calf, which initially he thought  may have  twisted muscle but quickly the pain and cramping has become more frequent and eventually he decided to come to ED. he has been compliant with his medications including Eliquis .  Family reported that the patient has not been watching his diet and gained about 20 pounds since last year.  Currently, patient denied any chest pain palpitation shortness of breath.  ED Course: Temperature 99 nontachycardic blood pressure 130/80 O2 saturation 94% on room air.  DVT study positive for right femoral vein nonocclusive DVT blood work showed WBC 4.8 hemoglobin 12.6 BUN 37 creatinine 1.9 bicarb 25 glucose 173  Patient was started on heparin  drip in the ED.  Review of Systems: As per HPI otherwise 14 point review of systems negative.    Past Medical History:  Diagnosis Date   Actinic keratosis    Anticoagulant long-term use    ELIQUIS    Arthritis    Basal cell carcinoma 01/27/2010   Right spinal upper back. Superficial.    Basal cell carcinoma  11/15/2019   Left inferior antihelix post. to tragus. Superficial and nodular patterns. Excised: 11/27/2019   Basal cell carcinoma 11/15/2019   Left mid lateral nose. Nodular and infiltrative patterns. Excised: 11/27/2019   Basal cell carcinoma 11/27/2019   Left mid superior helix. BCC with focal sclerosis.   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 Slight atypia. Edges free.   Dysplastic nevus 06/30/2023   R flank, moderate atypia   ED (erectile dysfunction)    GERD (gastroesophageal reflux disease)    Hiatal hernia    History of DVT of lower extremity    POST OP KNEE SURGERY   History of transient ischemic attack (TIA)    PER PT HX  ?POSSIBLE-- PT ON ELIQUIS    Hyperlipidemia    Hypertension    Hypogonadism male    OSA (obstructive sleep apnea)    per pt positive sleep study yrs ago recommended CPAP --  pt Non- compliant -- states uses Breath Rite Nasal Strips   Peripheral neuropathy    Prostate cancer [facility]) urologist-  dr jannette  oncologist-- dr patrcia   dx 03-25-2015  T1c, Gleason 4+3  s/p  cryoablation prostate 06-02-2015/  external  radiation therapy 08-16-2016 to 09-20-2016/  scheduled for radioactive seed implants 10-22-2016   TIA (  transient ischemic attack)    ?? 08/20/2014? Eliquis  07/02/15   Type 2 diabetes mellitus (HCC)    followed by dr jerona sayre [facility])  last A1c 8.4 on 07-27-2016 per lov note   Umbilical hernia    Wears glasses    Wears hearing aid    bilateral    Past Surgical History:  Procedure Laterality Date   COLONOSCOPY WITH PROPOFOL  N/A 05/30/2017   Procedure: COLONOSCOPY WITH PROPOFOL ;  Surgeon: Viktoria Lamar DASEN, MD;  Location: [facility] ENDOSCOPY;  Service: Endoscopy;  Laterality: N/A;   COLONOSCOPY WITH PROPOFOL  N/A 12/28/2021   Procedure: COLONOSCOPY WITH PROPOFOL ;  Surgeon: Onita Elspeth Sharper, DO;  Location: [facility] ENDOSCOPY;  Service: Gastroenterology;  Laterality: N/A;   CRYOABLATION N/A 06/02/2015   Procedure: CRYO ABLATION PROSTATE;  Surgeon: Arlena Gal, MD;  Location: [facility];  Service: Urology;  Laterality: N/A;   CYSTOSCOPY WITH URETHRAL DILATATION N/A 05/04/2019   Procedure: CYSTOSCOPY WITH URETHRAL DILATATION;  Surgeon: Alvaro Hummer, MD;  Location: [facility];  Service: Urology;  Laterality: N/A;   ELBOW BURSA SURGERY Left Feb 1997   prostate biopsies     x12, cryotherapy   RADIOACTIVE SEED IMPLANT N/A 10/22/2016   Procedure: RADIOACTIVE SEED IMPLANT/BRACHYTHERAPY IMPLANT, URETHRAL STRICTURE DIALIATION;  Surgeon: Arlena Gal, MD;  Location: [facility] [place];  Service: Urology;  Laterality: N/A;   REVERSE SHOULDER ARTHROPLASTY Right 02/10/2024   Procedure: ARTHROPLASTY, SHOULDER, TOTAL, REVERSE;  Surgeon: Sharl Selinda Dover, MD;  Location: [facility];  Service: Orthopedics;  Laterality: Right;   SHOULDER OPEN ROTATOR CUFF REPAIR Left 09-04-2014   skin cancer L ear     Dr Hester    TOTAL KNEE ARTHROPLASTY Bilateral right 08-13-2008  &  left 11-14-2008   UMBILICAL HERNIA REPAIR  06-23-2012   ureter stretch  05/02/2019   [facility] Dr Alvaro     reports that he has never smoked. He has never used smokeless tobacco. He reports that he does not drink alcohol  and does not use drugs.  Allergies  Allergen Reactions   Bee Venom Anaphylaxis   Yellow Jacket Venom Anaphylaxis   Crestor [Rosuvastatin] Other (See Comments)    muscle weakness and discomfort   Vytorin [Ezetimibe-Simvastatin] Other (See Comments)    muscle weakness and discomfort   Dynapen [Dicloxacillin] Rash   Jardiance [Empagliflozin] Rash    Yeast infection    Family History  Problem Relation Age of Onset   Cancer Mother 19       colon/resection     Prior to Admission medications   Medication Sig Start Date End Date  Taking? Authorizing Provider  ACCU-CHEK FASTCLIX LANCETS MISC  10/29/16   [provider]  amLODipine  (NORVASC ) 5 MG tablet Take 5 mg by mouth every evening. 05/09/23   [provider]  apixaban  (ELIQUIS ) 5 MG TABS tablet TAKE 1 TABLET(5 MG) BY MOUTH TWICE DAILY 10/01/19   [provider]  Ascorbic Acid (VITAMIN C) 1000 MG tablet Take 1,000 mg by mouth 2 (two) times daily.    [provider]  bisoprolol  (ZEBETA ) 5 MG tablet Take 5 mg by mouth every evening.    [provider]  cyanocobalamin (VITAMIN B12) 1000 MCG tablet Take 1,000 mcg by mouth daily.    [provider]  EPINEPHrine 0.3 mg/0.3 mL IJ SOAJ injection INJECT 0.3 MG INTO THE MUSCLE ONCE AS NEEDED FOR ANAPHYLAXIS 10/10/19   [provider]  fluorouracil (EFUDEX) 5 % cream Apply 1 Application topically at  bedtime. 01/20/24   [provider]  furosemide  (LASIX ) 40 MG tablet Take 40 mg by mouth daily.  11/15/19   [provider]  glimepiride  (AMARYL ) 2 MG tablet Take 2 mg by mouth 2 (two) times daily. 07/03/12   [provider]  insulin  glargine-yfgn (SEMGLEE ) 100 UNIT/ML injection Inject 26 Units into the skin at bedtime.    [provider]  KRILL OIL PO Take 1 capsule by mouth daily.    [provider]  lisinopril  (ZESTRIL ) 20 MG tablet Take 20 mg by mouth 2 (two) times daily.    [provider]  metFORMIN  (GLUCOPHAGE ) 500 MG tablet Take 500 mg by mouth 2 (two) times daily.    [provider]  NOVOLOG  FLEXPEN 100 UNIT/ML FlexPen Inject 16 Units into the skin 3 (three) times daily with meals. 08/02/22   [provider]  omeprazole (PRILOSEC) 40 MG capsule Take 40 mg by mouth daily.    [provider]  ondansetron  (ZOFRAN ) 4 MG tablet Take 1 tablet (4 mg total) by mouth every 8 (eight) hours as needed for vomiting or nausea. 02/10/24   Sharl Selinda Dover, MD  OVER THE COUNTER MEDICATION Take 2 capsules  by mouth daily. Super Joint Support supplement    [provider]  OVER THE COUNTER MEDICATION Take 1 capsule by mouth daily. CinnaChroma supplement    [provider]  oxyCODONE  (ROXICODONE ) 5 MG immediate release tablet Take 1 tablet (5 mg total) by mouth every 6 (six) hours as needed for severe pain (pain score 7-10) or moderate pain (pain score 4-6). 02/10/24 02/09/25  Sharl Selinda Dover, MD  pioglitazone  (ACTOS ) 45 MG tablet Take 45 mg by mouth daily.    [provider]  Red Yeast Rice 600 MG CAPS Take 600 mg by mouth 2 (two) times daily.    [provider]  Testosterone  20.25 MG/ACT (1.62%) GEL Apply 1 pump to 1 shoulder and 2 pumps to the other shoulder daily (3 pumps total) 08/20/22   Stoioff, Glendia BROCKS, MD    Physical Exam: Vitals:   05/13/24 0557  BP: 130/81  Pulse: 69  Resp: 20  Temp: 99 F (37.2 C)  TempSrc: Oral  SpO2: 94%  Weight: 111.1 kg  Height: 5' 9 (1.753 m)    Constitutional: NAD, calm, comfortable Vitals:   05/13/24 0557  BP: 130/81  Pulse: 69  Resp: 20  Temp: 99 F (37.2 C)  TempSrc: Oral  SpO2: 94%  Weight: 111.1 kg  Height: 5' 9 (1.753 m)   Eyes: PERRL, lids and conjunctivae normal ENMT: Mucous membranes are moist. Posterior pharynx clear of any exudate or lesions.Normal dentition.  Neck: normal, supple, no masses, no thyromegaly Respiratory: clear to auscultation bilaterally, no wheezing, no crackles. Normal respiratory effort. No accessory muscle use.  Cardiovascular: Regular rate and rhythm, no murmurs / rubs / gallops. No extremity edema. 2+ pedal pulses. No carotid bruits.  Abdomen: no tenderness, no masses palpated. No hepatosplenomegaly. Bowel sounds positive.  Musculoskeletal: no clubbing / cyanosis. No joint deformity upper and lower extremities. Good ROM, no contractures. Normal muscle tone. Right calf tenderness Skin: no rashes, lesions, ulcers. No induration Neurologic: CN 2-12 grossly intact. Sensation  intact, DTR normal. Strength 5/5 in all 4.  Psychiatric: Normal judgment and insight. Alert and oriented x 3. Normal mood.     Labs on Admission: I have personally reviewed following labs and imaging studies  CBC: Recent Labs  Lab 05/13/24 0559  WBC 4.8  NEUTROABS 2.2  HGB 12.6*  HCT 36.0*  MCV 93.0  PLT 272   Basic Metabolic Panel: Recent Labs  Lab 05/13/24 0559  NA 140  K 3.7  CL 104  CO2 25  GLUCOSE 173*  BUN 32*  CREATININE 1.90*  CALCIUM 9.3  MG 2.2   GFR: Estimated Creatinine Clearance: 38.1 mL/min (A) (by C-G formula based on SCr of 1.9 mg/dL (H)). Liver Function Tests: Recent Labs  Lab 05/13/24 0559  AST 20  ALT 18  ALKPHOS 65  BILITOT 0.8  PROT 6.7  ALBUMIN 3.7   No results for input(s): LIPASE, AMYLASE in the last 168 hours. No results for input(s): AMMONIA in the last 168 hours. Coagulation Profile: Recent Labs  Lab 05/13/24 0559  INR 1.1   Cardiac Enzymes: No results for input(s): CKTOTAL, CKMB, CKMBINDEX, TROPONINI in the last 168 hours. BNP (last 3 results) No results for input(s): PROBNP in the last 8760 hours. HbA1C: No results for input(s): HGBA1C in the last 72 hours. CBG: No results for input(s): GLUCAP in the last 168 hours. Lipid Profile: No results for input(s): CHOL, HDL, LDLCALC, TRIG, CHOLHDL, LDLDIRECT in the last 72 hours. Thyroid  Function Tests: No results for input(s): TSH, T4TOTAL, FREET4, T3FREE, THYROIDAB in the last 72 hours. Anemia Panel: No results for input(s): VITAMINB12, FOLATE, FERRITIN, TIBC, IRON, RETICCTPCT in the last 72 hours. Urine analysis:    Component Value Date/Time   APPEARANCEUR Clear 03/19/2022 0910   GLUCOSEU 3+ (A) 03/19/2022 0910   BILIRUBINUR Negative 03/19/2022 0910   PROTEINUR Negative 03/19/2022 0910   NITRITE Negative 03/19/2022 0910   LEUKOCYTESUR Negative 03/19/2022 0910    Radiological Exams on Admission: US  Venous Img  Lower Unilateral Right Addendum Date: 05/13/2024 ADDENDUM REPORT: 05/13/2024 07:55 ADDENDUM: I just received a phone call from Dr. Nicholaus. The laterality on this report is not accurate. This study is a RIGHT LOWER EXTREMITY VENOUS ULTRASOUND. Imaging of the RIGHT lower extremity was performed. The nonocclusive thrombus is in the RIGHT femoral vein. Dr. Nicholaus is aware of this addendum. Electronically Signed   By: Camellia Candle M.D.   On: 05/13/2024 07:55   Result Date: 05/13/2024 CLINICAL DATA:  Calf pain.  History of DVT. EXAM: LEFT LOWER EXTREMITY VENOUS DOPPLER ULTRASOUND TECHNIQUE: Gray-scale sonography with graded compression, as well as color Doppler and duplex ultrasound were performed to evaluate the lower extremity deep venous systems from the level of the common femoral vein and including the common femoral, femoral, profunda femoral, popliteal and calf veins including the posterior tibial, peroneal and gastrocnemius veins when visible. The superficial great saphenous vein was also interrogated. Spectral Doppler was utilized to evaluate flow at rest and with distal augmentation maneuvers in the common femoral, femoral and popliteal veins. COMPARISON:  Reports for right lower extremity DVT ultrasound 07/04/2009 and 09/15/2009. Images for those studies is currently unavailable. FINDINGS: Contralateral Common Femoral Vein: Respiratory phasicity is normal and symmetric with the symptomatic side. No evidence of thrombus. Normal compressibility. Common Femoral Vein: No evidence of thrombus. Normal compressibility, respiratory phasicity and response to augmentation. Saphenofemoral Junction: No evidence of thrombus. Normal compressibility and flow on color Doppler imaging. Profunda Femoral Vein: No evidence of thrombus. Normal compressibility and flow on color Doppler imaging. Femoral Vein: Nonocclusive thrombus evident. This nonocclusive thrombus does not have an overtly string like appearance as would be  expected in the setting of chronic thrombus Popliteal Vein: No evidence of thrombus. Normal compressibility, respiratory phasicity and response to augmentation. Calf Veins: No  evidence of thrombus. Normal compressibility and flow on color Doppler imaging. Other Findings:  None. IMPRESSION: Positive for nonocclusive DVT in the left femoral vein. Acute nonocclusive thrombus favored in this patient with a remote history of DVT in the right lower extremity. Critical Value/emergent results were called by telephone at the time of interpretation on 05/13/2024 at 7:18 am to provider Dr. Nicholaus, who verbally acknowledged these results. Electronically Signed: By: Camellia Candle M.D. On: 05/13/2024 07:18    EKG: Independently reviewed.  Sinus rhythm, no acute ST changes.  Assessment/Plan Principal Problem:   DVT (deep venous thrombosis) (HCC)  (please populate well all problems here in Problem List. (For example, if patient is on BP meds at home and you resume or decide to hold them, it is a problem that needs to be her. Same for CAD, COPD, HLD and so on)  Acute on chronic DVT, RLE - Breakthrough DVT development despite on systemic anticoagulation of Eliquis  chronically.  ED physician discussed case with on-call hematology, whose opinion is right now there is no enough evidence to support Eliquis  treatment failure, on the other hand the dosage of Eliquis  may need to be increased.  Hematology recommended patient stay on heparin  drip for 24 hours and hematology will decide anticoagulation regimen on discharge, likely will increase Eliquis  dosage. - Patient has no symptoms or signs of chest pain or shortness of breath, will hold off CTA study. - Heparin  drip  HTN/chronic HFpEF - Stable, continue home BP regimen including amlodipine  Lasix , lisinopril   IDDM - Fairly controlled - Cut down Lantus  dosage to 15 units daily - Continue p.o. diabetic medication including metformin  Actos , glimepiride   CKD stage IIIb -  Euvolemic and creatinine level stable - Continue p.o. Lasix   Morbid obesity -BMI=36 - Calorie control recommended  Total time spent on patient care 55 minutes  DVT prophylaxis: Heparin  drip Code Status: Full code Family Communication: Wife at bedside Disposition Plan: Expect less than 2 midnight hospital stay Consults called: Hematology Admission status: MedSurg observation   Cort ONEIDA Mana MD [facility] Pager [phone]  05/13/2024, 8:38 AM

## 2024-05-13 NOTE — Plan of Care (Signed)
  Problem: Education: Goal: Ability to describe self-care measures that may prevent or decrease complications (Diabetes Survival Skills Education) will improve Outcome: Progressing Goal: Individualized Educational Video(s) Outcome: Progressing   Problem: Coping: Goal: Ability to adjust to condition or change in health will improve Outcome: Progressing   Problem: Health Behavior/Discharge Planning: Goal: Ability to identify and utilize available resources and services will improve Outcome: Progressing Goal: Ability to manage health-related needs will improve Outcome: Progressing   Problem: Metabolic: Goal: Ability to maintain appropriate glucose levels will improve Outcome: Progressing   Problem: Skin Integrity: Goal: Risk for impaired skin integrity will decrease Outcome: Progressing   Problem: Health Behavior/Discharge Planning: Goal: Ability to manage health-related needs will improve Outcome: Progressing   Problem: Activity: Goal: Risk for activity intolerance will decrease Outcome: Progressing   Problem: Pain Managment: Goal: General experience of comfort will improve and/or be controlled Outcome: Progressing   Problem: Skin Integrity: Goal: Risk for impaired skin integrity will decrease Outcome: Progressing

## 2024-05-13 NOTE — ED Triage Notes (Signed)
 Patient ambulatory to triage with complaints of intermittent right calf pain x3-4 days. Patient states when his cramps come on he can barely tolerate to walk. Has previous hx of DVTs, is currently on eliquis .

## 2024-05-13 NOTE — ED Provider Notes (Signed)
 [facility] Provider Note    Event Date/Time   First MD Initiated Contact with Patient 05/13/24 [phone]     (approximate)   History   Leg Pain   HPI  Jeremiah Zhang is a 78 y.o. male with past medical history of type 2 diabetes, hypertension, end-stage renal disease with a history of prostate cancer and prior DVT on Eliquis  who presents with 3 days of right calf pain.  Patient has had a nonproductive cough for 1 month but denies any shortness of breath or chest pain.  He drove back from a trip in [place]  recently greater than 8-hour drive but denies any recent surgeries except in April and no other episodes of prolonged immobilization.  He does take his Eliquis  twice daily and has not missed any doses.  Pain is crampy worse with exertion--but none currently while laying in bed.  Denies any sensation changes in his lower extremities.  No history of intracranial hemorrhage or GI bleeding.  Patient's wife is at bedside (ICU nurse) who helps contribute to the history      Physical Exam   Triage Vital Signs: ED Triage Vitals [05/13/24 0557]  Encounter Vitals Group     BP 130/81     Girls Systolic BP Percentile      Girls Diastolic BP Percentile      Boys Systolic BP Percentile      Boys Diastolic BP Percentile      Pulse Rate 69     Resp 20     Temp 99 F (37.2 C)     Temp Source Oral     SpO2 94 %     Weight 245 lb (111.1 kg)     Height 5' 9 (1.753 m)     Head Circumference      Peak Flow      Pain Score      Pain Loc      Pain Education      Exclude from Growth Chart     Most recent vital signs: Vitals:   05/13/24 0557  BP: 130/81  Pulse: 69  Resp: 20  Temp: 99 F (37.2 C)  SpO2: 94%    Nursing Triage Note reviewed. Vital signs reviewed and patients oxygen saturation is normoxic  General: Patient is well nourished, well developed, awake and alert, rHead: Normocephalic and atraumatic Eyes: Normal inspection, extraocular muscles  intact, no conjunctival pallor Ear, nose, throat: Normal external exam Neck: Normal range of motion Respiratory: Patient is in no respiratory distress, lungs CTAB Cardiovascular: Patient is not tachycardic, RRR without murmur appreciated GI: Abd SNT with no guarding or rebound  Back: Normal inspection of the back with good strength and range of motion throughout all ext Extremities: pulses intact with good cap refills, no LE pitting edema or calf tenderness 1+ DP pulses, PT Neuro: The patient is alert and oriented to person, place, and time, appropriately conversive, with 5/5 bilat UE/LE strength, no gross motor or sensory defects noted. Coordination appears to be adequate. Skin: Warm, dry, and intact Psych: normal mood and affect, no SI or HI  ED Results / Procedures / Treatments   Labs (all labs ordered are listed, but only abnormal results are displayed) Labs Reviewed  CBC WITH DIFFERENTIAL/PLATELET - Abnormal; Notable for the following components:      Result Value   RBC 3.87 (*)    Hemoglobin 12.6 (*)    HCT 36.0 (*)    All other components within normal  limits  COMPREHENSIVE METABOLIC PANEL WITH GFR - Abnormal; Notable for the following components:   Glucose, Bld 173 (*)    BUN 32 (*)    Creatinine, Ser 1.90 (*)    GFR, Estimated 35 (*)    All other components within normal limits  MAGNESIUM  PROTIME-INR  APTT  BRAIN NATRIURETIC PEPTIDE  APTT  TROPONIN I (HIGH SENSITIVITY)     EKG EKG and rhythm strip are interpreted by myself:   EKG: [Normal sinus rhythm] at heart rate of 71, normal QRS duration, QTc 510, nonspecific ST segments and T waves no ectopy EKG not consistent with Acute STEMI Rhythm strip: NSR in lead II   RADIOLOGY Doppler ultrasound: Nonocclusive femoral DVT in right lower extremity   PROCEDURES:  Critical Care performed: No  Procedures   MEDICATIONS ORDERED IN ED: Medications  heparin  ADULT infusion 100 units/mL (25000 units/250mL)  (1,700 Units/hr Intravenous New Bag/Given 05/13/24 0829)  oxyCODONE  (Oxy IR/ROXICODONE ) immediate release tablet 5 mg (has no administration in time range)  amLODipine  (NORVASC ) tablet 5 mg (has no administration in time range)  bisoprolol  (ZEBETA ) tablet 5 mg (has no administration in time range)  furosemide  (LASIX ) tablet 40 mg (has no administration in time range)  lisinopril  (ZESTRIL ) tablet 20 mg (has no administration in time range)  glimepiride  (AMARYL ) tablet 2 mg (has no administration in time range)  insulin  glargine-yfgn (SEMGLEE ) injection 15 Units (has no administration in time range)  metFORMIN  (GLUCOPHAGE ) tablet 500 mg (has no administration in time range)  pioglitazone  (ACTOS ) tablet 45 mg (has no administration in time range)  pantoprazole  (PROTONIX ) EC tablet 40 mg (has no administration in time range)  ondansetron  (ZOFRAN ) tablet 4 mg (has no administration in time range)  insulin  aspart (novoLOG ) injection 0-9 Units (has no administration in time range)  insulin  aspart (novoLOG ) injection 0-5 Units (has no administration in time range)     IMPRESSION / MDM / ASSESSMENT AND PLAN / ED COURSE                                Differential diagnosis includes, but is not limited to, DVT, electrolyte derangement, acute renal insufficiency, PE   ED course: Patient is well-appearing and his lower extremity is neurovascularly intact.  I received a call from radiologist stating that he had a new acute DVT in his right femoral vein.  Unfortunately it was noticed that radiologist possibly dictated the note incorrectly with left versus right as patient is symptomatic on the right and the study was ordered as right femoral vein.  Radiologist was to be contacted to clarify.  Either way, patient does have a new acute DVT despite taking his Eliquis  appropriately and will require a heparin  drip.  Since he took his Eliquis  yesterday evening less than 12 hours will order all the heparin  drip  without bolus (pharmacy contacted and notified).  I do wonder whether he has a PE however since the treatment would not differ at this point we will hold off on obtaining a CT PE.  I reviewed the indications benefits risks and alternatives of heparin  drip with the patient and his wife and they voiced understanding and opted to proceed.  Will call the patient in for admission for likely hematology consult   Clinical Course as of 05/13/24 0924  Sun May 13, 2024  0736 Creatinine(!): 1.90 [HD]  0736 GFR, Estimated(!): 35 Creatinine is borderline high and given that he  has a history of renal failure, I will hold off on obtaining a CT PE at this time as it would not change my management with initiating a heparin  drip [HD]  0751 Dr. Minus will amend the ultrasound report [HD]  0812 Discussed case with Dr. Laurita, he asked that we touch base with hematology prior to admitting. [facility]UC will reach out to hematology.  [HD]  9179 Case discussed with Dr. Rennie.  He states for the patient to remain on heparin  drip for at least 24 hours.  He will see the patient tomorrow morning.  Plan would be likely to increase his Eliquis  dose but he is not completely sure yet [HD]    Clinical Course User Index [HD] Nicholaus Rolland BRAVO, MD  --  Data(2/3 categories following were performed): I reviewed or ordered at least three unique tests, external notes, and/or the history required an independent historian as one of the three requirements as following: At least 3 labs/imaging studies were obtained and/or reviewed. AND  I discussed the management of the patient with the following external physician or qualified healthcare provider: Admitting physician  Risk: This patient has a high risk of morbidity due to further diagnostic testing or treatment. Rationale: Decision made regarding admission  Admit Level 5 - Labs/Rads, Admit, Consult: Hematology Dr. Rennie  Suggested E/M Coding Level: 5, 99285  This level has  been selected based on the 05-25-22 CPT guidelines for E/M codes in the Emergency Department based on 2/3 of the CoPA, Data, and Risk.    FINAL CLINICAL IMPRESSION(S) / ED DIAGNOSES   Final diagnoses:  Acute deep vein thrombosis (DVT) of femoral vein of right lower extremity (HCC)  Anticoagulated     Rx / DC Orders   ED Discharge Orders     None        Note:  This document was prepared using Dragon voice recognition software and may include unintentional dictation errors.   Nicholaus Rolland BRAVO, MD 05/13/24 [phone]

## 2024-05-13 NOTE — Progress Notes (Signed)
 PHARMACY - ANTICOAGULATION CONSULT NOTE  Pharmacy Consult for heparin  infusion Indication: DVT  Allergies  Allergen Reactions   Bee Venom Anaphylaxis   Yellow Jacket Venom Anaphylaxis   Crestor [Rosuvastatin] Other (See Comments)    muscle weakness and discomfort   Vytorin [Ezetimibe-Simvastatin] Other (See Comments)    muscle weakness and discomfort   Dynapen [Dicloxacillin] Rash   Jardiance [Empagliflozin] Rash    Yeast infection    Patient Measurements: Height: 5' 9 (175.3 cm) Weight: 111.1 kg (245 lb) IBW/kg (Calculated) : 70.7 HEPARIN  DW (KG): 95.2  Vital Signs: Temp: 99 F (37.2 C) (07/27 0557) Temp Source: Oral (07/27 0557) BP: 130/81 (07/27 0557) Pulse Rate: 69 (07/27 0557)  Labs: Recent Labs    05/13/24 0559  HGB 12.6*  HCT 36.0*  PLT 272  LABPROT 14.4  INR 1.1  CREATININE 1.90*    Estimated Creatinine Clearance: 38.1 mL/min (A) (by C-G formula based on SCr of 1.9 mg/dL (H)).   Medical History: Past Medical History:  Diagnosis Date   Actinic keratosis    Anticoagulant long-term use    ELIQUIS    Arthritis    Basal cell carcinoma 01/27/2010   Right spinal upper back. Superficial.    Basal cell carcinoma 11/15/2019   Left inferior antihelix post. to tragus. Superficial and nodular patterns. Excised: 11/27/2019   Basal cell carcinoma 11/15/2019   Left mid lateral nose. Nodular and infiltrative patterns. Excised: 11/27/2019   Basal cell carcinoma 11/27/2019   Left mid superior helix. BCC with focal sclerosis.   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 Slight atypia. Edges free.   Dysplastic nevus 06/30/2023   R flank, moderate atypia   ED (erectile dysfunction)    GERD (gastroesophageal reflux disease)    Hiatal hernia    History of DVT of lower extremity    POST OP KNEE SURGERY    History of transient ischemic attack (TIA)    PER PT HX  ?POSSIBLE-- PT ON ELIQUIS    Hyperlipidemia    Hypertension    Hypogonadism male    OSA (obstructive sleep apnea)    per pt positive sleep study yrs ago recommended CPAP --  pt Non- compliant -- states uses Breath Rite Nasal Strips   Peripheral neuropathy    Prostate cancer [facility]) urologist-  dr jannette  oncologist-- dr patrcia   dx 03-25-2015  T1c, Gleason 4+3  s/p  cryoablation prostate 06-02-2015/  external  radiation therapy 08-16-2016 to 09-20-2016/  scheduled for radioactive seed implants 10-22-2016   TIA (transient ischemic attack)    ?? 08/20/2014? Eliquis  07/02/15   Type 2 diabetes mellitus (HCC)    followed by dr jerona sayre [facility] clinic)  last A1c 8.4 on 07-27-2016 per lov note   Umbilical hernia    Wears glasses    Wears hearing aid    bilateral    Assessment:  78 y.o. male with past medical history of type 2 diabetes, hypertension, end-stage renal disease with a history of prostate cancer and prior DVT on Eliquis  who presents with 3 days of right calf pain. Venous US  shows nonocclusive DVT in the left femoral vein. Last apixaban  dose last night   Baseline Labs: Hgb 12.6, PLT wnl, INR 1.1, aPTT pending  Goal of Therapy:  anti-Xa level  0.3-0.7 units/ml aPTT 66 -  102 seconds Monitor platelets by anticoagulation protocol: Yes   Plan:  ---Start heparin  infusion at 1700 units/hr (initial bolus withheld per MD request ISO recent apixaban  administration) ---Check aPTT level in 8 hours (we will use aPTT to guide therapy until aPTT and anti-Xa level correlate) ---Continue to monitor H&H and platelets  Adriana JONETTA Bolster 05/13/2024,7:43 AM

## 2024-05-14 DIAGNOSIS — I824Y9 Acute embolism and thrombosis of unspecified deep veins of unspecified proximal lower extremity: Secondary | ICD-10-CM

## 2024-05-14 DIAGNOSIS — I82411 Acute embolism and thrombosis of right femoral vein: Principal | ICD-10-CM

## 2024-05-14 LAB — BASIC METABOLIC PANEL WITH GFR
Anion gap: 13 (ref 5–15)
BUN: 37 mg/dL — ABNORMAL HIGH (ref 8–23)
CO2: 25 mmol/L (ref 22–32)
Calcium: 9 mg/dL (ref 8.9–10.3)
Chloride: 102 mmol/L (ref 98–111)
Creatinine, Ser: 1.9 mg/dL — ABNORMAL HIGH (ref 0.61–1.24)
GFR, Estimated: 35 mL/min — ABNORMAL LOW (ref >60)
Glucose, Bld: 178 mg/dL — ABNORMAL HIGH (ref 70–99)
Potassium: 3.5 mmol/L (ref 3.5–5.1)
Sodium: 140 mmol/L (ref 135–145)

## 2024-05-14 LAB — CBC
HCT: 33.6 % — ABNORMAL LOW (ref 39.0–52.0)
Hemoglobin: 11.6 g/dL — ABNORMAL LOW (ref 13.0–17.0)
MCH: 32.1 pg (ref 26.0–34.0)
MCHC: 34.5 g/dL (ref 30.0–36.0)
MCV: 93.1 fL (ref 80.0–100.0)
Platelets: 258 K/uL (ref 150–400)
RBC: 3.61 MIL/uL — ABNORMAL LOW (ref 4.22–5.81)
RDW: 14.1 % (ref 11.5–15.5)
WBC: 4.7 K/uL (ref 4.0–10.5)
nRBC: 0 % (ref 0.0–0.2)

## 2024-05-14 LAB — GLUCOSE, CAPILLARY
Glucose-Capillary: 143 mg/dL — ABNORMAL HIGH (ref 70–99)
Glucose-Capillary: 144 mg/dL — ABNORMAL HIGH (ref 70–99)

## 2024-05-14 LAB — APTT
aPTT: 69 s — ABNORMAL HIGH (ref 24–36)
aPTT: 80 s — ABNORMAL HIGH (ref 24–36)

## 2024-05-14 LAB — HEPARIN LEVEL (UNFRACTIONATED): Heparin Unfractionated: 0.77 [IU]/mL — ABNORMAL HIGH (ref 0.30–0.70)

## 2024-05-14 MED ORDER — ENOXAPARIN SODIUM 150 MG/ML IJ SOSY
1.5000 mg/kg | PREFILLED_SYRINGE | INTRAMUSCULAR | Status: DC
  Administered 2024-05-14: 150 mg via SUBCUTANEOUS
  Filled 2024-05-14: qty 1

## 2024-05-14 MED ORDER — INSULIN ASPART 100 UNIT/ML IJ SOLN
5.0000 [IU] | Freq: Three times a day (TID) | INTRAMUSCULAR | Status: DC
  Administered 2024-05-14: 5 [IU] via SUBCUTANEOUS
  Filled 2024-05-14: qty 1

## 2024-05-14 MED ORDER — ENOXAPARIN SODIUM 150 MG/ML IJ SOSY: 1.5000 mg/kg | PREFILLED_SYRINGE | INTRAMUSCULAR | 0 refills | Status: AC

## 2024-05-14 NOTE — Discharge Summary (Signed)
 Physician Discharge Summary   Patient: Jeremiah Zhang MRN: [id number]  DOB: [birth year]   Admit:     Date of Admission: 05/13/2024 Admitted from: home   Discharge: Date of discharge: 05/14/24 Disposition: Home Condition at discharge: good  CODE STATUS: FULL CODE     Discharge Physician: Laneta Blunt, DO [facility]     PCP: Diedra Lame, MD  Recommendations for Outpatient Follow-up:  Follow up with PCP Diedra Lame, MD in 2-4 weeks Follow as directed with hematology     Discharge Instructions     Diet - low sodium heart healthy   Complete by: As directed    Diet Carb Modified   Complete by: As directed    Increase activity slowly   Complete by: As directed          Discharge Diagnoses: Principal Problem:   DVT (deep venous thrombosis) [facility])     Hospital course / significant events:   HPI: Jeremiah Zhang is a 78 y.o. male with medical history significant of DVT on Eliquis , HTN, IDDM, HLD, morbid obesity, CKD stage IIIb, GERD, multiple joint OA, presented with new onset of right calf pain. Recent long drive back and forth to [place] , uneventful, returned 1 week ago. 3-4 days ago noted calf cramping/pain. Worsening and came to the ED   07/27: to ED. VSS. DVT study positive for right femoral vein nonocclusive DVT. EDP spoke w/ Hematology - recs for 24h heparin  gtt and likely adjust anticoagulation tomorrow  [phone]: per hematology switching to lovenox  for next month and follow up       Consultants:  Hematology/Oncology  Procedures/Surgeries: none      ASSESSMENT & PLAN:   Acute on chronic DVT, RLE Breakthrough DVT development despite compliance with Eliquis  5 mg bid.   Patient has no symptoms or signs of chest pain or shortness of breath, will hold off CTA study. ED physician discussed case with on-call hematology, whose opinion is right now there is no enough evidence to support Eliquis  treatment failure, on the other hand  the dosage of Eliquis  may need to be increased.   Of note, previously on testosterone  but last dose few months ago  Hematology recs for lovenox  and outpatient follow up    HTN Question HFpEF not in acute exacerbation - no mention of this dx on recent cardiology office notes  HLD Echo stress test 10/2023 - normal stress echo, disatolic fxn not assessed. Follows w/ Dr Ammon at [facility]  Not in acute exacerbation  Resume home meds  Statin allergy, takes red yeast rice    IDDM Fairly controlled Cut down Lantus  dosage to 15 units daily Continue p.o. diabetic medication including metformin  Actos , glimepiride    CKD stage IIIb Euvolemic and creatinine level stable Continue p.o. Lasix   ED Takes testosterone , last dose few months ago  Given recurrent DVT through Eliquis , advise against testosterone      Class 2 obesity based on BMI: Body mass index is 36.18 kg/m.SABRA Significantly low or high BMI is associated with higher medical risk.  Underweight - under 18  overweight - 25 to 29 obese - 30 or more Class 1 obesity: BMI of 30.0 to 34 Class 2 obesity: BMI of 35.0 to 39 Class 3 obesity: BMI of 40.0 to 49 Super Morbid Obesity: BMI 50-59 Super-super Morbid Obesity: BMI 60+ Healthy nutrition and physical activity advised as adjunct to other disease management and risk reduction treatments  Discharge Instructions  Allergies as of 05/14/2024       Reactions   Bee Venom Anaphylaxis   Yellow Jacket Venom Anaphylaxis   Crestor [rosuvastatin] Other (See Comments)   muscle weakness and discomfort   Vytorin [ezetimibe-simvastatin] Other (See Comments)   muscle weakness and discomfort   Dynapen [dicloxacillin] Rash   Jardiance [empagliflozin] Rash   Yeast infection        Medication List     STOP taking these medications    apixaban  5 MG Tabs tablet Commonly known as: ELIQUIS    ondansetron  4 MG tablet Commonly known as: Zofran    oxyCODONE  5  MG immediate release tablet Commonly known as: Roxicodone    Testosterone  20.25 MG/ACT (1.62%) Gel       TAKE these medications    Accu-Chek FastClix Lancets Misc   amLODipine  5 MG tablet Commonly known as: NORVASC  Take 5 mg by mouth every evening.   bisoprolol -hydrochlorothiazide 10-6.25 MG tablet Commonly known as: ZIAC Take 1 tablet by mouth daily.   clotrimazole 1 % external solution Commonly known as: LOTRIMIN Apply 1 Application topically 2 (two) times daily.   cyanocobalamin 1000 MCG tablet Commonly known as: VITAMIN B12 Take 1,000 mcg by mouth daily.   enoxaparin  150 MG/ML injection Commonly known as: LOVENOX  Inject 1 mL (150 mg total) into the skin daily.   EPINEPHrine 0.3 mg/0.3 mL Soaj injection Commonly known as: EPI-PEN INJECT 0.3 MG INTO THE MUSCLE ONCE AS NEEDED FOR ANAPHYLAXIS   fluorouracil 5 % cream Commonly known as: EFUDEX Apply 1 Application topically at bedtime.   furosemide  40 MG tablet Commonly known as: LASIX  Take 40 mg by mouth daily.   glimepiride  2 MG tablet Commonly known as: AMARYL  Take 2 mg by mouth 2 (two) times daily.   insulin  glargine-yfgn 100 UNIT/ML injection Commonly known as: SEMGLEE  Inject 26 Units into the skin at bedtime.   KRILL OIL PO Take 1 capsule by mouth daily.   metFORMIN  500 MG tablet Commonly known as: GLUCOPHAGE  Take 500 mg by mouth 2 (two) times daily.   NovoLOG  FlexPen 100 UNIT/ML FlexPen Generic drug: insulin  aspart Inject 16 Units into the skin 3 (three) times daily with meals.   omeprazole 40 MG capsule Commonly known as: PRILOSEC Take 40 mg by mouth daily.   pioglitazone  45 MG tablet Commonly known as: ACTOS  Take 45 mg by mouth daily.   quinapril 20 MG tablet Commonly known as: ACCUPRIL Take 20 mg by mouth 2 (two) times daily.   Red Yeast Rice 600 MG Caps Take 600 mg by mouth 2 (two) times daily.   urea 20 % cream Commonly known as: CARMOL Apply 1 Application topically daily.    vitamin C 1000 MG tablet Take 1,000 mg by mouth 2 (two) times daily.         Follow-up Information     Diedra Lame, MD Follow up.   Specialty: Family Medicine Why: [facility] (at Dr office) stated they will call patient and set up follow up appt  hospital follow up Contact information: [address] [place] [address] [phone]         Rennie Cindy SAUNDERS, MD Follow up.   Specialties: Internal Medicine, Oncology Why: this office will call patient with follow up appt Contact information: [address] [place] [address] [phone]                 Allergies  Allergen Reactions   Bee Venom Anaphylaxis   Yellow Jacket Venom Anaphylaxis  Crestor [Rosuvastatin] Other (See Comments)    muscle weakness and discomfort   Vytorin [Ezetimibe-Simvastatin] Other (See Comments)    muscle weakness and discomfort   Dynapen [Dicloxacillin] Rash   Jardiance [Empagliflozin] Rash    Yeast infection     Subjective: pt feeling well this morning, still some calf pain, no CP/SOB, ambulating well    Discharge Exam: BP 136/71 (BP Location: Left Arm)   Pulse 62   Temp 98.3 F (36.8 C)   Resp 19   Ht 5' 9 (1.753 m)   Wt 111.1 kg   SpO2 96%   BMI 36.18 kg/m  General: Pt is alert, awake, not in acute distress Cardiovascular: RRR, S1/S2 +, no rubs, no gallops Respiratory: CTA bilaterally, no wheezing, no rhonchi Abdominal: Soft, NT, ND, bowel sounds + Extremities: no edema, no cyanosis     The results of significant diagnostics from this hospitalization (including imaging, microbiology, ancillary and laboratory) are listed below for reference.     Microbiology: No results found for this or any previous visit (from the past 240 hours).   Labs: BNP (last 3 results) Recent Labs    05/13/24 0559  BNP 40.8   Basic Metabolic Panel: Recent Labs  Lab 05/13/24 0559 05/14/24 0143  NA 140 140  K 3.7 3.5  CL 104 102  CO2 25 25  GLUCOSE  173* 178*  BUN 32* 37*  CREATININE 1.90* 1.90*  CALCIUM 9.3 9.0  MG 2.2  --    Liver Function Tests: Recent Labs  Lab 05/13/24 0559  AST 20  ALT 18  ALKPHOS 65  BILITOT 0.8  PROT 6.7  ALBUMIN 3.7   No results for input(s): LIPASE, AMYLASE in the last 168 hours. No results for input(s): AMMONIA in the last 168 hours. CBC: Recent Labs  Lab 05/13/24 0559 05/14/24 0143  WBC 4.8 4.7  NEUTROABS 2.2  --   HGB 12.6* 11.6*  HCT 36.0* 33.6*  MCV 93.0 93.1  PLT 272 258   Cardiac Enzymes: No results for input(s): CKTOTAL, CKMB, CKMBINDEX, TROPONINI in the last 168 hours. BNP: Invalid input(s): POCBNP CBG: Recent Labs  Lab 05/13/24 1226 05/13/24 1641 05/13/24 2016 05/14/24 0806 05/14/24 1139  GLUCAP 173* 177* 178* 143* 144*   D-Dimer No results for input(s): DDIMER in the last 72 hours. Hgb A1c No results for input(s): HGBA1C in the last 72 hours. Lipid Profile No results for input(s): CHOL, HDL, LDLCALC, TRIG, CHOLHDL, LDLDIRECT in the last 72 hours. Thyroid  function studies No results for input(s): TSH, T4TOTAL, T3FREE, THYROIDAB in the last 72 hours.  Invalid input(s): FREET3 Anemia work up No results for input(s): VITAMINB12, FOLATE, FERRITIN, TIBC, IRON, RETICCTPCT in the last 72 hours. Urinalysis    Component Value Date/Time   APPEARANCEUR Clear 03/19/2022 0910   GLUCOSEU 3+ (A) 03/19/2022 0910   BILIRUBINUR Negative 03/19/2022 0910   PROTEINUR Negative 03/19/2022 0910   NITRITE Negative 03/19/2022 0910   LEUKOCYTESUR Negative 03/19/2022 0910   Sepsis Labs Recent Labs  Lab 05/13/24 0559 05/14/24 0143  WBC 4.8 4.7   Microbiology No results found for this or any previous visit (from the past 240 hours). Imaging US  Venous Img Lower Unilateral Right Addendum Date: 05/13/2024 ADDENDUM REPORT: 05/13/2024 07:55 ADDENDUM: I just received a phone call from Dr. Nicholaus. The laterality on this report is  not accurate. This study is a RIGHT LOWER EXTREMITY VENOUS ULTRASOUND. Imaging of the RIGHT lower extremity was performed. The nonocclusive thrombus is in the RIGHT femoral vein.  Dr. Nicholaus is aware of this addendum. Electronically Signed   By: Camellia Candle M.D.   On: 05/13/2024 07:55   Result Date: 05/13/2024 CLINICAL DATA:  Calf pain.  History of DVT. EXAM: LEFT LOWER EXTREMITY VENOUS DOPPLER ULTRASOUND TECHNIQUE: Gray-scale sonography with graded compression, as well as color Doppler and duplex ultrasound were performed to evaluate the lower extremity deep venous systems from the level of the common femoral vein and including the common femoral, femoral, profunda femoral, popliteal and calf veins including the posterior tibial, peroneal and gastrocnemius veins when visible. The superficial great saphenous vein was also interrogated. Spectral Doppler was utilized to evaluate flow at rest and with distal augmentation maneuvers in the common femoral, femoral and popliteal veins. COMPARISON:  Reports for right lower extremity DVT ultrasound 07/04/2009 and 09/15/2009. Images for those studies is currently unavailable. FINDINGS: Contralateral Common Femoral Vein: Respiratory phasicity is normal and symmetric with the symptomatic side. No evidence of thrombus. Normal compressibility. Common Femoral Vein: No evidence of thrombus. Normal compressibility, respiratory phasicity and response to augmentation. Saphenofemoral Junction: No evidence of thrombus. Normal compressibility and flow on color Doppler imaging. Profunda Femoral Vein: No evidence of thrombus. Normal compressibility and flow on color Doppler imaging. Femoral Vein: Nonocclusive thrombus evident. This nonocclusive thrombus does not have an overtly string like appearance as would be expected in the setting of chronic thrombus Popliteal Vein: No evidence of thrombus. Normal compressibility, respiratory phasicity and response to augmentation. Calf Veins: No  evidence of thrombus. Normal compressibility and flow on color Doppler imaging. Other Findings:  None. IMPRESSION: Positive for nonocclusive DVT in the left femoral vein. Acute nonocclusive thrombus favored in this patient with a remote history of DVT in the right lower extremity. Critical Value/emergent results were called by telephone at the time of interpretation on 05/13/2024 at 7:18 am to provider Dr. Nicholaus, who verbally acknowledged these results. Electronically Signed: By: Camellia Candle M.D. On: 05/13/2024 07:18      Time coordinating discharge: over 30 minutes  SIGNED:  Leler Brion DO [facility]

## 2024-05-14 NOTE — Progress Notes (Signed)
 PHARMACY - ANTICOAGULATION CONSULT NOTE  Pharmacy Consult for heparin  infusion Indication: DVT  Allergies  Allergen Reactions   Bee Venom Anaphylaxis   Yellow Jacket Venom Anaphylaxis   Crestor [Rosuvastatin] Other (See Comments)    muscle weakness and discomfort   Vytorin [Ezetimibe-Simvastatin] Other (See Comments)    muscle weakness and discomfort   Dynapen [Dicloxacillin] Rash   Jardiance [Empagliflozin] Rash    Yeast infection    Patient Measurements: Height: 5' 9 (175.3 cm) Weight: 111.1 kg (245 lb) IBW/kg (Calculated) : 70.7 HEPARIN  DW (KG): 95.2  Vital Signs: Temp: 98.2 F (36.8 C) (07/27 2016) BP: 120/74 (07/27 2016) Pulse Rate: 67 (07/27 2016)  Labs: Recent Labs    05/13/24 0559 05/13/24 0604 05/13/24 1556 05/14/24 0143  HGB 12.6*  --   --  11.6*  HCT 36.0*  --   --  33.6*  PLT 272  --   --  258  APTT  --  31 62* 69*  LABPROT 14.4  --   --   --   INR 1.1  --   --   --   HEPARINUNFRC  --   --   --  0.77*  CREATININE 1.90*  --   --  1.90*  TROPONINIHS 8  --   --   --     Estimated Creatinine Clearance: 38.1 mL/min (A) (by C-G formula based on SCr of 1.9 mg/dL (H)).   Medical History: Past Medical History:  Diagnosis Date   Actinic keratosis    Anticoagulant long-term use    ELIQUIS    Arthritis    Basal cell carcinoma 01/27/2010   Right spinal upper back. Superficial.    Basal cell carcinoma 11/15/2019   Left inferior antihelix post. to tragus. Superficial and nodular patterns. Excised: 11/27/2019   Basal cell carcinoma 11/15/2019   Left mid lateral nose. Nodular and infiltrative patterns. Excised: 11/27/2019   Basal cell carcinoma 11/27/2019   Left mid superior helix. BCC with focal sclerosis.   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 Slight atypia. Edges free.   Dysplastic nevus  06/30/2023   R flank, moderate atypia   ED (erectile dysfunction)    GERD (gastroesophageal reflux disease)    Hiatal hernia    History of DVT of lower extremity    POST OP KNEE SURGERY   History of transient ischemic attack (TIA)    PER PT HX  ?POSSIBLE-- PT ON ELIQUIS    Hyperlipidemia    Hypertension    Hypogonadism male    OSA (obstructive sleep apnea)    per pt positive sleep study yrs ago recommended CPAP --  pt Non- compliant -- states uses Breath Rite Nasal Strips   Peripheral neuropathy    Prostate cancer [facility]) urologist-  dr jannette  oncologist-- dr patrcia   dx 03-25-2015  T1c, Gleason 4+3  s/p  cryoablation prostate 06-02-2015/  external  radiation therapy 08-16-2016 to 09-20-2016/  scheduled for radioactive seed implants 10-22-2016   TIA (transient ischemic attack)    ?? 08/20/2014? Eliquis  07/02/15   Type 2 diabetes mellitus (HCC)    followed by dr jerona sayre [facility])  last A1c 8.4 on 07-27-2016 per lov note   Umbilical hernia    Wears glasses    Wears hearing aid    bilateral  Assessment:  78 y.o. male with past medical history of type 2 diabetes, hypertension, end-stage renal disease with a history of prostate cancer and prior DVT on Eliquis  who presents with 3 days of right calf pain. Venous US  shows nonocclusive DVT in the left femoral vein. Last apixaban  dose last night   Baseline Labs: Hgb 12.6, PLT wnl, INR 1.1, aPTT pending  Date Time HL/aPTT Rate/Comment 07/27 1556 - / 62  Slightly subtherapeutic 07/28 0143 0.77 / 69 aPTT therapeutic x 1  Goal of Therapy:  anti-Xa level  0.3-0.7 units/ml aPTT 66 - 102 seconds Monitor platelets by anticoagulation protocol: Yes   Plan:  Continue heparin  infusion at 1900 units/hr  Recheck aPTT level in 8 hours to confirm (we will use aPTT to guide therapy until aPTT and anti-Xa level correlate) Continue to monitor H&H and platelets  Thank you for involving pharmacy in this patient's care.   Rankin CANDIE Dills, PharmD, [facility] 05/14/2024 4:17 AM

## 2024-05-14 NOTE — Hospital Course (Addendum)
 Hospital course / significant events:   HPI: Jeremiah Zhang is a 78 y.o. male with medical history significant of DVT on Eliquis , HTN, IDDM, HLD, morbid obesity, CKD stage IIIb, GERD, multiple joint OA, presented with new onset of right calf pain. Recent long drive back and forth to [place] , uneventful, returned 1 week ago. 3-4 days ago noted calf cramping/pain. Worsening and came to the ED   07/27: to ED. VSS. DVT study positive for right femoral vein nonocclusive DVT. EDP spoke w/ Hematology - recs for 24h heparin  gtt and likely adjust anticoagulation tomorrow  0728: per hematology ***      Consultants:  Hematology/Oncology  Procedures/Surgeries: none      ASSESSMENT & PLAN:   Acute on chronic DVT, RLE Breakthrough DVT development despite compliance with Eliquis  5 mg bid.   Patient has no symptoms or signs of chest pain or shortness of breath, will hold off CTA study. ED physician discussed case with on-call hematology, whose opinion is right now there is no enough evidence to support Eliquis  treatment failure, on the other hand the dosage of Eliquis  may need to be increased.   Of note, previously on testosterone  *** Hematology recommended patient stay on heparin  drip for 24 hours and hematology will decide anticoagulation regimen on discharge, likely will increase Eliquis  dosage.   HTN Question HFpEF not in acute exacerbation - no mention of this dx on recent cardiology office notes  HLD Echo stress test 10/2023 - normal stress echo, disatolic fxn not assessed. Follows w/ Dr Ammon at [facility]  Not in acute exacerbation  continue amlodipine  5 mg, Lasix  40 mg daily Held home Bisoprolol -hydrochlorothiazide ***, Quinapril *** Statin allergy, takes red yeast rice    IDDM Fairly controlled Cut down Lantus  dosage to 15 units daily Continue p.o. diabetic medication including metformin  Actos , glimepiride    CKD stage IIIb Euvolemic and creatinine level  stable Continue p.o. Lasix   ED Takes testosterone , last dose *** Given recurrent DVT through Eliquis , advise against testosterone      Class 2 obesity based on BMI: Body mass index is 36.18 kg/m.SABRA Significantly low or high BMI is associated with higher medical risk.  Underweight - under 18  overweight - 25 to 29 obese - 30 or more Class 1 obesity: BMI of 30.0 to 34 Class 2 obesity: BMI of 35.0 to 39 Class 3 obesity: BMI of 40.0 to 49 Super Morbid Obesity: BMI 50-59 Super-super Morbid Obesity: BMI 60+ Healthy nutrition and physical activity advised as adjunct to other disease management and risk reduction treatments    DVT prophylaxis: *** IV fluids: *** continuous IV fluids  Nutrition: *** Central lines / other devices: ***  Code Status: *** ACP documentation reviewed: *** none on file in [facility]  TOC needs: *** Medical barriers to dispo: ***. Expected medical readiness for discharge ***.

## 2024-05-14 NOTE — Plan of Care (Signed)
  Problem: Education: Goal: Ability to describe self-care measures that may prevent or decrease complications (Diabetes Survival Skills Education) will improve Outcome: Progressing   Problem: Coping: Goal: Ability to adjust to condition or change in health will improve Outcome: Progressing   Problem: Fluid Volume: Goal: Ability to maintain a balanced intake and output will improve Outcome: Progressing   Problem: Health Behavior/Discharge Planning: Goal: Ability to identify and utilize available resources and services will improve Outcome: Progressing   Problem: Skin Integrity: Goal: Risk for impaired skin integrity will decrease Outcome: Progressing   Problem: Clinical Measurements: Goal: Will remain free from infection Outcome: Progressing

## 2024-05-14 NOTE — Care Management Obs Status (Signed)
 MEDICARE OBSERVATION STATUS NOTIFICATION   Patient Details  Name: Jeremiah Zhang MRN: [id number] Date of Birth: 06/11/1946   Medicare Observation Status Notification Given:  Yes    Bular Hickok W, CMA 05/14/2024, 12:06 PM

## 2024-05-14 NOTE — Progress Notes (Signed)
 PHARMACY - ANTICOAGULATION CONSULT NOTE  Pharmacy Consult for heparin  infusion Indication: DVT  Allergies  Allergen Reactions   Bee Venom Anaphylaxis   Yellow Jacket Venom Anaphylaxis   Crestor [Rosuvastatin] Other (See Comments)    muscle weakness and discomfort   Vytorin [Ezetimibe-Simvastatin] Other (See Comments)    muscle weakness and discomfort   Dynapen [Dicloxacillin] Rash   Jardiance [Empagliflozin] Rash    Yeast infection    Patient Measurements: Height: 5' 9 (175.3 cm) Weight: 111.1 kg (245 lb) IBW/kg (Calculated) : 70.7 HEPARIN  DW (KG): 95.2  Vital Signs: Temp: 98.3 F (36.8 C) (07/28 0810) BP: 136/71 (07/28 0810) Pulse Rate: 62 (07/28 0810)  Labs: Recent Labs    05/13/24 0559 05/13/24 0604 05/13/24 1556 05/14/24 0143 05/14/24 1003  HGB 12.6*  --   --  11.6*  --   HCT 36.0*  --   --  33.6*  --   PLT 272  --   --  258  --   APTT  --    < > 62* 69* 80*  LABPROT 14.4  --   --   --   --   INR 1.1  --   --   --   --   HEPARINUNFRC  --   --   --  0.77*  --   CREATININE 1.90*  --   --  1.90*  --   TROPONINIHS 8  --   --   --   --    < > = values in this interval not displayed.    Estimated Creatinine Clearance: 38.1 mL/min (A) (by C-G formula based on SCr of 1.9 mg/dL (H)).   Medical History: Past Medical History:  Diagnosis Date   Actinic keratosis    Anticoagulant long-term use    ELIQUIS    Arthritis    Basal cell carcinoma 01/27/2010   Right spinal upper back. Superficial.    Basal cell carcinoma 11/15/2019   Left inferior antihelix post. to tragus. Superficial and nodular patterns. Excised: 11/27/2019   Basal cell carcinoma 11/15/2019   Left mid lateral nose. Nodular and infiltrative patterns. Excised: 11/27/2019   Basal cell carcinoma 11/27/2019   Left mid superior helix. BCC with focal sclerosis.   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 Slight atypia. Edges free.   Dysplastic nevus 06/30/2023   R flank, moderate atypia   ED (erectile dysfunction)    GERD (gastroesophageal reflux disease)    Hiatal hernia    History of DVT of lower extremity    POST OP KNEE SURGERY   History of transient ischemic attack (TIA)    PER PT HX  ?POSSIBLE-- PT ON ELIQUIS    Hyperlipidemia    Hypertension    Hypogonadism male    OSA (obstructive sleep apnea)    per pt positive sleep study yrs ago recommended CPAP --  pt Non- compliant -- states uses Breath Rite Nasal Strips   Peripheral neuropathy    Prostate cancer [facility]) urologist-  dr jannette  oncologist-- dr patrcia   dx 03-25-2015  T1c, Gleason 4+3  s/p  cryoablation prostate 06-02-2015/  external  radiation therapy 08-16-2016 to 09-20-2016/  scheduled for radioactive seed implants 10-22-2016   TIA (transient ischemic attack)    ?? 08/20/2014? Eliquis  07/02/15   Type 2 diabetes  mellitus (HCC)    followed by dr jerona sayre [facility])  last A1c 8.4 on 07-27-2016 per lov note   Umbilical hernia    Wears glasses    Wears hearing aid    bilateral    Assessment:  78 y.o. male with past medical history of type 2 diabetes, hypertension, end-stage renal disease with a history of prostate cancer and prior DVT on Eliquis  who presents with 3 days of right calf pain. Venous US  shows nonocclusive DVT in the left femoral vein. Last apixaban  dose last night   Baseline Labs: Hgb 12.6, PLT wnl, INR 1.1, aPTT pending  Date Time HL/aPTT Rate/Comment 07/27 1556 - / 62  Slightly subtherapeutic 07/28 0143 0.77 / 69 aPTT therapeutic x 1 07/28 1003 aPTT 80 Therapeutic x2  Goal of Therapy:  anti-Xa level  0.3-0.7 units/ml aPTT 66 - 102 seconds Monitor platelets by anticoagulation protocol: Yes   Plan:  07/28 1003 aPTT 80 Therapeutic x2 Continue heparin  infusion at 1900 units/hr  check aPTT with am labs (we will use aPTT to guide  therapy until aPTT and anti-Xa level correlate) Continue to monitor H&H and platelets  Thank you for involving pharmacy in this patient's care.   Allean Haas PharmD Clinical Pharmacist 05/14/2024

## 2024-05-14 NOTE — Consult Note (Signed)
 [facility] CONSULT NOTE  Patient Care Team: Diedra Lame, MD as PCP - General (Family Medicine)  CHIEF COMPLAINTS/PURPOSE OF CONSULTATION: acute DVT  Oncology History   No history exists.    HISTORY OF PRESENTING ILLNESS: Alone/  Jeremiah Zhang 78 y.o.  male pleasant patient with a history of DVT [8-10 years ago]-left lower extremity status post surgery; and history of TIA on Eliquis  is currently in the hospital for right lower extremity DVT.  Patient states that he has been a road trip to [place]  and just came back week prior to presentation.  Patient states that he has been compliant on Eliquis  all throughout the trip.  3 to 4 days prior to presentation to the hospital patient noted to have cramping pain with right leg.  Further evaluation emergency room patient noted to have a blood clot-right lower extremity.  Patient started on IV heparin .  Notes improvement of the pain.  He denies any bleeding.  Review of Systems  Constitutional:  Negative for chills, diaphoresis, fever, malaise/fatigue and weight loss.  HENT:  Negative for nosebleeds and sore throat.   Eyes:  Negative for double vision.  Respiratory:  Negative for cough, hemoptysis, sputum production, shortness of breath and wheezing.   Cardiovascular:  Positive for leg swelling. Negative for chest pain, palpitations and orthopnea.  Gastrointestinal:  Negative for abdominal pain, blood in stool, constipation, diarrhea, heartburn, melena, nausea and vomiting.  Genitourinary:  Negative for dysuria, frequency and urgency.  Musculoskeletal:  Negative for back pain and joint pain.  Skin: Negative.  Negative for itching and rash.  Neurological:  Negative for dizziness, tingling, focal weakness, weakness and headaches.  Endo/Heme/Allergies:  Does not bruise/bleed easily.  Psychiatric/Behavioral:  Negative for depression. The patient is not nervous/anxious and does not have insomnia.     MEDICAL HISTORY:   Past Medical History:  Diagnosis Date   Actinic keratosis    Anticoagulant long-term use    ELIQUIS    Arthritis    Basal cell carcinoma 01/27/2010   Right spinal upper back. Superficial.    Basal cell carcinoma 11/15/2019   Left inferior antihelix post. to tragus. Superficial and nodular patterns. Excised: 11/27/2019   Basal cell carcinoma 11/15/2019   Left mid lateral nose. Nodular and infiltrative patterns. Excised: 11/27/2019   Basal cell carcinoma 11/27/2019   Left mid superior helix. BCC with focal sclerosis.   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 Slight atypia. Edges free.   Dysplastic nevus 06/30/2023   R flank, moderate atypia   ED (erectile dysfunction)    GERD (gastroesophageal reflux disease)    Hiatal hernia    History of DVT of lower extremity    POST OP KNEE SURGERY   History of transient ischemic attack (TIA)    PER PT HX  ?POSSIBLE-- PT ON ELIQUIS    Hyperlipidemia    Hypertension    Hypogonadism male    OSA (obstructive sleep apnea)    per pt positive sleep study yrs ago recommended CPAP --  pt Non- compliant -- states uses Breath Rite Nasal Strips   Peripheral neuropathy    Prostate cancer [facility]) urologist-  dr jannette  oncologist-- dr patrcia   dx 03-25-2015  T1c, Gleason 4+3  s/p  cryoablation prostate 06-02-2015/  external  radiation therapy 08-16-2016 to  09-20-2016/  scheduled for radioactive seed implants 10-22-2016   TIA (transient ischemic attack)    ?? 08/20/2014? Eliquis  07/02/15   Type 2 diabetes mellitus (HCC)    followed by dr jerona sayre [facility])  last A1c 8.4 on 07-27-2016 per lov note   Umbilical hernia    Wears glasses    Wears hearing aid    bilateral    SURGICAL HISTORY: Past Surgical History:  Procedure Laterality Date   COLONOSCOPY WITH PROPOFOL  N/A 05/30/2017    Procedure: COLONOSCOPY WITH PROPOFOL ;  Surgeon: Viktoria Lamar DASEN, MD;  Location: [facility] ENDOSCOPY;  Service: Endoscopy;  Laterality: N/A;   COLONOSCOPY WITH PROPOFOL  N/A 12/28/2021   Procedure: COLONOSCOPY WITH PROPOFOL ;  Surgeon: Onita Elspeth Sharper, DO;  Location: [facility] ENDOSCOPY;  Service: Gastroenterology;  Laterality: N/A;   CRYOABLATION N/A 06/02/2015   Procedure: CRYO ABLATION PROSTATE;  Surgeon: Arlena Gal, MD;  Location: [facility];  Service: Urology;  Laterality: N/A;   CYSTOSCOPY WITH URETHRAL DILATATION N/A 05/04/2019   Procedure: CYSTOSCOPY WITH URETHRAL DILATATION;  Surgeon: Alvaro Hummer, MD;  Location: [facility];  Service: Urology;  Laterality: N/A;   ELBOW BURSA SURGERY Left Feb 1997   prostate biopsies     x12, cryotherapy   RADIOACTIVE SEED IMPLANT N/A 10/22/2016   Procedure: RADIOACTIVE SEED IMPLANT/BRACHYTHERAPY IMPLANT, URETHRAL STRICTURE DIALIATION;  Surgeon: Arlena Gal, MD;  Location: [facility];  Service: Urology;  Laterality: N/A;   REVERSE SHOULDER ARTHROPLASTY Right 02/10/2024   Procedure: ARTHROPLASTY, SHOULDER, TOTAL, REVERSE;  Surgeon: Sharl Selinda Dover, MD;  Location: [facility];  Service: Orthopedics;  Laterality: Right;   SHOULDER OPEN ROTATOR CUFF REPAIR Left 09-04-2014   skin cancer L ear     Dr Hester    TOTAL KNEE ARTHROPLASTY Bilateral right 08-13-2008  &  left 11-14-2008   UMBILICAL HERNIA REPAIR  06-23-2012   ureter stretch  05/02/2019   [facility] Dr Alvaro    SOCIAL HISTORY: Social History   Socioeconomic History   Marital status: Married    Spouse name: Not on file   Number of children: 2   Years of education: Not on file   Highest education level: Doctorate  Occupational History   Not on file  Tobacco Use   Smoking status: Never   Smokeless tobacco: Never  Vaping Use   Vaping status: Never Used  Substance and Sexual Activity   Alcohol  use: Never   Drug use:  Never   Sexual activity: Not Currently  Other Topics Concern   Not on file  Social History Narrative   Lives at home with wife   Caffeine: pepsi   Social Drivers of Health   Financial Resource Strain: Patient Declined (04/19/2023)   Received from [facility]   Overall Financial Resource Strain (CARDIA)    Difficulty of Paying Living Expenses: Patient declined  Food Insecurity: No Food Insecurity (05/13/2024)   Hunger Vital Sign    Worried About Running Out of Food in the Last Year: Never true    Ran Out of Food in the Last Year: Never true  Transportation Needs: No Transportation Needs (05/13/2024)   PRAPARE - Administrator, Civil Service (Medical): No    Lack of Transportation (Non-Medical): No  Physical Activity: Not on file  Stress: Not on file  Social Connections: Moderately Integrated (05/13/2024)   Social Connection and Isolation Panel    Frequency of Communication with Friends and Family: More than three times a week  Frequency of Social Gatherings with Friends and Family: More than three times a week    Attends Religious Services: More than 4 times per year    Active Member of [employer] or Organizations: No    Attends Banker Meetings: Never    Marital Status: Married  Catering manager Violence: Not At Risk (05/13/2024)   Humiliation, Afraid, Rape, and Kick questionnaire    Fear of Current or Ex-Partner: No    Emotionally Abused: No    Physically Abused: No    Sexually Abused: No    FAMILY HISTORY: Family History  Problem Relation Age of Onset   Cancer Mother 41       colon/resection    ALLERGIES:  is allergic to bee venom, yellow jacket venom, crestor [rosuvastatin], vytorin [ezetimibe-simvastatin], dynapen [dicloxacillin], and jardiance [empagliflozin].  MEDICATIONS:  Current Facility-Administered Medications  Medication Dose Route Frequency Provider Last Rate Last Admin   amLODipine  (NORVASC ) tablet 5 mg  5 mg Oral QPM  Laurita Manor T, MD   5 mg at 05/13/24 1721   bisoprolol  (ZEBETA ) tablet 10 mg  10 mg Oral QPM Laurita Manor T, MD   10 mg at 05/13/24 1742   enoxaparin  (LOVENOX ) injection 150 mg  1.5 mg/kg (Order-Specific) Subcutaneous Q24H Deshanta Lady R, MD   150 mg at 05/14/24 1441   furosemide  (LASIX ) tablet 40 mg  40 mg Oral Daily Zhang, Ping T, MD   40 mg at 05/14/24 0831   glimepiride  (AMARYL ) tablet 2 mg  2 mg Oral BID WC Zhang, Ping T, MD   2 mg at 05/14/24 0831   insulin  aspart (novoLOG ) injection 0-5 Units  0-5 Units Subcutaneous QHS Laurita Manor T, MD       insulin  aspart (novoLOG ) injection 0-9 Units  0-9 Units Subcutaneous TID WC Laurita Manor T, MD   1 Units at 05/14/24 1241   insulin  aspart (novoLOG ) injection 5 Units  5 Units Subcutaneous TID WC Alexander, Natalie, DO   5 Units at 05/14/24 1241   insulin  glargine-yfgn (SEMGLEE ) injection 15 Units  15 Units Subcutaneous QHS Laurita Manor DASEN, MD   15 Units at 05/13/24 2138   lisinopril  (ZESTRIL ) tablet 20 mg  20 mg Oral Daily Zhang, Ping T, MD   20 mg at 05/14/24 0831   metFORMIN  (GLUCOPHAGE ) tablet 500 mg  500 mg Oral BID WC Laurita Manor T, MD   500 mg at 05/14/24 0831   ondansetron  (ZOFRAN ) tablet 4 mg  4 mg Oral Q8H PRN Laurita Manor DASEN, MD       Oral care mouth rinse  15 mL Mouth Rinse PRN Laurita Manor DASEN, MD       oxyCODONE  (Oxy IR/ROXICODONE ) immediate release tablet 5 mg  5 mg Oral Q6H PRN Laurita Manor T, MD       pantoprazole  (PROTONIX ) EC tablet 40 mg  40 mg Oral Daily Zhang, Ping T, MD   40 mg at 05/14/24 0831   pioglitazone  (ACTOS ) tablet 45 mg  45 mg Oral Daily Zhang, Ping T, MD   45 mg at 05/14/24 0831   Facility-Administered Medications Ordered in Other Encounters  Medication Dose Route Frequency Provider Last Rate Last Admin   acetaminophen  (TYLENOL ) tablet 975 mg  975 mg Oral Q6H PRN Chales Idol, MD        PHYSICAL EXAMINATION:   Vitals:   05/14/24 0545 05/14/24 0810  BP: 124/70 136/71  Pulse: (!) 59 62  Resp: 19   Temp:  98.4 F (36.9 C)  98.3 F (36.8 C)  SpO2: 93% 96%   Filed Weights   05/13/24 0557  Weight: 245 lb (111.1 kg)   Mild swelling of the right lower extremity along with mild tenderness.  Physical Exam Vitals and nursing note reviewed.  HENT:     Head: Normocephalic and atraumatic.     Mouth/Throat:     Pharynx: Oropharynx is clear.  Eyes:     Extraocular Movements: Extraocular movements intact.     Pupils: Pupils are equal, round, and reactive to light.  Cardiovascular:     Rate and Rhythm: Normal rate and regular rhythm.  Pulmonary:     Comments: Decreased breath sounds bilaterally.  Abdominal:     Palpations: Abdomen is soft.  Musculoskeletal:        General: Normal range of motion.     Cervical back: Normal range of motion.  Skin:    General: Skin is warm.  Neurological:     General: No focal deficit present.     Mental Status: He is alert and oriented to person, place, and time.  Psychiatric:        Behavior: Behavior normal.        Judgment: Judgment normal.     LABORATORY DATA:  I have reviewed the data as listed Lab Results  Component Value Date   WBC 4.7 05/14/2024   HGB 11.6 (L) 05/14/2024   HCT 33.6 (L) 05/14/2024   MCV 93.1 05/14/2024   PLT 258 05/14/2024   Recent Labs    02/13/24 0835 05/13/24 0559 05/14/24 0143  NA 138 140 140  K 3.8 3.7 3.5  CL 102 104 102  CO2 23 25 25   GLUCOSE 261* 173* 178*  BUN 31* 32* 37*  CREATININE 1.71* 1.90* 1.90*  CALCIUM 8.5* 9.3 9.0  GFRNONAA 40* 35* 35*  PROT  --  6.7  --   ALBUMIN  --  3.7  --   AST  --  20  --   ALT  --  18  --   ALKPHOS  --  65  --   BILITOT  --  0.8  --     RADIOGRAPHIC STUDIES: I have personally reviewed the radiological images as listed and agreed with the findings in the report. US  Venous Img Lower Unilateral Right Addendum Date: 05/13/2024 ADDENDUM REPORT: 05/13/2024 07:55 ADDENDUM: I just received a phone call from Dr. Nicholaus. The laterality on this report is not accurate. This  study is a RIGHT LOWER EXTREMITY VENOUS ULTRASOUND. Imaging of the RIGHT lower extremity was performed. The nonocclusive thrombus is in the RIGHT femoral vein. Dr. Nicholaus is aware of this addendum. Electronically Signed   By: Camellia Candle M.D.   On: 05/13/2024 07:55   Result Date: 05/13/2024 CLINICAL DATA:  Calf pain.  History of DVT. EXAM: LEFT LOWER EXTREMITY VENOUS DOPPLER ULTRASOUND TECHNIQUE: Gray-scale sonography with graded compression, as well as color Doppler and duplex ultrasound were performed to evaluate the lower extremity deep venous systems from the level of the common femoral vein and including the common femoral, femoral, profunda femoral, popliteal and calf veins including the posterior tibial, peroneal and gastrocnemius veins when visible. The superficial great saphenous vein was also interrogated. Spectral Doppler was utilized to evaluate flow at rest and with distal augmentation maneuvers in the common femoral, femoral and popliteal veins. COMPARISON:  Reports for right lower extremity DVT ultrasound 07/04/2009 and 09/15/2009. Images for those studies is currently unavailable. FINDINGS: Contralateral Common Femoral Vein: Respiratory phasicity is normal  and symmetric with the symptomatic side. No evidence of thrombus. Normal compressibility. Common Femoral Vein: No evidence of thrombus. Normal compressibility, respiratory phasicity and response to augmentation. Saphenofemoral Junction: No evidence of thrombus. Normal compressibility and flow on color Doppler imaging. Profunda Femoral Vein: No evidence of thrombus. Normal compressibility and flow on color Doppler imaging. Femoral Vein: Nonocclusive thrombus evident. This nonocclusive thrombus does not have an overtly string like appearance as would be expected in the setting of chronic thrombus Popliteal Vein: No evidence of thrombus. Normal compressibility, respiratory phasicity and response to augmentation. Calf Veins: No evidence of thrombus.  Normal compressibility and flow on color Doppler imaging. Other Findings:  None. IMPRESSION: Positive for nonocclusive DVT in the left femoral vein. Acute nonocclusive thrombus favored in this patient with a remote history of DVT in the right lower extremity. Critical Value/emergent results were called by telephone at the time of interpretation on 05/13/2024 at 7:18 am to provider Dr. Nicholaus, who verbally acknowledged these results. Electronically Signed: By: Camellia Candle M.D. On: 05/13/2024 07:18     No problem-specific Assessment & Plan notes found for this encounter.  # 78 y.o.  male pleasant patient with a history of DVT [8-10 years ago]-left lower extremity status post surgery; and history of TIA on Eliquis  is currently in the hospital for right lower extremity DVT.   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 Acute nonocclusive thrombus favored in this patient with a remote history of DVT in the right lower extremity.  -while on Eliquis .  Currently on IV heparin .  # History of CKD-stage III  # History of TIA-  Recommendations/plan:  # Patient admits compliance to Eliquis  throughout his road trip to [place] .  Long drive is a potential risk factor-however DVT happened while compliant on Eliquis . Given the new blood clot I would recommend starting the patient on Lovenox  1.5 mg/kg dose once a day x 1 month.  Renally adjusted discussed with pharmacy.  Alternate anticoagulation Pradaxa could be considered outpatient basis.  # Consider hypercoagulable As outpatient-this might not cause any treatment decisions as patient will need indefinite anticoagulation.  However for the patient however-could have potential consequences from family.  # Thank you Dr. Marsa for allowing me to participate in the care of your pleasant patient. Please do not hesitate to contact me with questions or concerns in the  interim.  Above plan of care was discussed with patient and his wife  in detail.  Will plan outpatient follow-up in the next few weeks.  I have also informed Dr. Marea of the patient's status.     Cindy JONELLE Joe, MD 05/14/2024 3:02 PM

## 2024-05-15 ENCOUNTER — Other Ambulatory Visit: Payer: Self-pay | Admitting: *Deleted

## 2024-05-15 ENCOUNTER — Other Ambulatory Visit: Payer: Self-pay | Admitting: Internal Medicine

## 2024-05-15 DIAGNOSIS — I824Z9 Acute embolism and thrombosis of unspecified deep veins of unspecified distal lower extremity: Secondary | ICD-10-CM

## 2024-05-15 DIAGNOSIS — C61 Malignant neoplasm of prostate: Secondary | ICD-10-CM

## 2024-05-15 NOTE — Progress Notes (Signed)
 Please schedule a 15-minute follow-up-MD labs CBC CMP in about 3 weeks or so.  Re:DVT  GB

## 2024-06-08 ENCOUNTER — Encounter: Payer: Self-pay | Admitting: Internal Medicine

## 2024-06-08 ENCOUNTER — Inpatient Hospital Stay (HOSPITAL_BASED_OUTPATIENT_CLINIC_OR_DEPARTMENT_OTHER): Admitting: Internal Medicine

## 2024-06-08 ENCOUNTER — Inpatient Hospital Stay: Attending: Internal Medicine

## 2024-06-08 VITALS — BP 130/87 | HR 99 | Temp 97.6°F | Resp 16 | Wt 257.0 lb

## 2024-06-08 DIAGNOSIS — I4891 Unspecified atrial fibrillation: Secondary | ICD-10-CM | POA: Diagnosis not present

## 2024-06-08 DIAGNOSIS — Z8673 Personal history of transient ischemic attack (TIA), and cerebral infarction without residual deficits: Secondary | ICD-10-CM | POA: Diagnosis not present

## 2024-06-08 DIAGNOSIS — I82461 Acute embolism and thrombosis of right calf muscular vein: Secondary | ICD-10-CM

## 2024-06-08 DIAGNOSIS — Z7901 Long term (current) use of anticoagulants: Secondary | ICD-10-CM | POA: Insufficient documentation

## 2024-06-08 DIAGNOSIS — I824Z9 Acute embolism and thrombosis of unspecified deep veins of unspecified distal lower extremity: Secondary | ICD-10-CM

## 2024-06-08 DIAGNOSIS — D649 Anemia, unspecified: Secondary | ICD-10-CM | POA: Insufficient documentation

## 2024-06-08 DIAGNOSIS — I82411 Acute embolism and thrombosis of right femoral vein: Secondary | ICD-10-CM | POA: Diagnosis not present

## 2024-06-08 LAB — CBC WITH DIFFERENTIAL (CANCER CENTER ONLY)
Abs Immature Granulocytes: 0.01 K/uL (ref 0.00–0.07)
Basophils Absolute: 0 K/uL (ref 0.0–0.1)
Basophils Relative: 1 %
Eosinophils Absolute: 0.2 K/uL (ref 0.0–0.5)
Eosinophils Relative: 4 %
HCT: 35.4 % — ABNORMAL LOW (ref 39.0–52.0)
Hemoglobin: 11.9 g/dL — ABNORMAL LOW (ref 13.0–17.0)
Immature Granulocytes: 0 %
Lymphocytes Relative: 33 %
Lymphs Abs: 1.6 K/uL (ref 0.7–4.0)
MCH: 31.8 pg (ref 26.0–34.0)
MCHC: 33.6 g/dL (ref 30.0–36.0)
MCV: 94.7 fL (ref 80.0–100.0)
Monocytes Absolute: 0.5 K/uL (ref 0.1–1.0)
Monocytes Relative: 10 %
Neutro Abs: 2.5 K/uL (ref 1.7–7.7)
Neutrophils Relative %: 52 %
Platelet Count: 269 K/uL (ref 150–400)
RBC: 3.74 MIL/uL — ABNORMAL LOW (ref 4.22–5.81)
RDW: 15.1 % (ref 11.5–15.5)
WBC Count: 4.8 K/uL (ref 4.0–10.5)
nRBC: 0 % (ref 0.0–0.2)

## 2024-06-08 LAB — CMP (CANCER CENTER ONLY)
ALT: 23 U/L (ref 0–44)
AST: 23 U/L (ref 15–41)
Albumin: 3.8 g/dL (ref 3.5–5.0)
Alkaline Phosphatase: 53 U/L (ref 38–126)
Anion gap: 10 (ref 5–15)
BUN: 19 mg/dL (ref 8–23)
CO2: 22 mmol/L (ref 22–32)
Calcium: 9 mg/dL (ref 8.9–10.3)
Chloride: 104 mmol/L (ref 98–111)
Creatinine: 1.79 mg/dL — ABNORMAL HIGH (ref 0.61–1.24)
GFR, Estimated: 38 mL/min — ABNORMAL LOW (ref >60)
Glucose, Bld: 222 mg/dL — ABNORMAL HIGH (ref 70–99)
Potassium: 3.7 mmol/L (ref 3.5–5.1)
Sodium: 136 mmol/L (ref 135–145)
Total Bilirubin: 0.6 mg/dL (ref 0.0–1.2)
Total Protein: 6.8 g/dL (ref 6.5–8.1)

## 2024-06-08 NOTE — Progress Notes (Signed)
 [facility] CONSULT NOTE  Patient Care Team: Diedra Lame, MD as PCP - General (Family Medicine) Rennie Cindy SAUNDERS, MD as Consulting Physician (Oncology)  CHIEF COMPLAINTS/PURPOSE OF CONSULTATION: Acute DVT  Oncology History   No history exists.    history of DVT [8-10 years ago]-left lower extremity status post surgery; and history of TIA on Eliquis  is with AUG 2025- [facility] for right [facility] D[facility]  [long road trip]    HISTORY OF PRESENTING ILLNESS: Patient ambulating-independently. Accompanied by  wife.   Jeremiah Zhang 78 y.o.  male pleasant patient with a medical history significant of DVT on Eliquis , HTN, IDDM, HLD, morbid obesity, CKD stage IIIb, GERD, multiple joint OA, presented with new onset of right calf pain. Recent long drive back and forth to [place] , uneventful, returned 1 week ago.   07/27: to ED. DVT study positive for right femoral vein nonocclusive DVT.  Patient was initially started on IV heparin .  And then over the Lovenox  and outpatient basis.  Patient currently doing well.  States his pain in his right leg is down by 90%.  Does admit to ongoing bilateral leg swelling right more than left.   Review of Systems  Constitutional:  Negative for chills, diaphoresis, fever, malaise/fatigue and weight loss.  HENT:  Negative for nosebleeds and sore throat.   Eyes:  Negative for double vision.  Respiratory:  Negative for cough, hemoptysis, sputum production, shortness of breath and wheezing.   Cardiovascular:  Positive for leg swelling. Negative for chest pain, palpitations and orthopnea.  Gastrointestinal:  Negative for abdominal pain, blood in stool, constipation, diarrhea, heartburn, melena, nausea and vomiting.  Genitourinary:  Negative for dysuria, frequency and urgency.  Musculoskeletal:  Negative for back pain and joint pain.  Skin: Negative.  Negative for itching and rash.  Neurological:  Negative for dizziness, tingling, focal  weakness, weakness and headaches.  Endo/Heme/Allergies:  Does not bruise/bleed easily.  Psychiatric/Behavioral:  Negative for depression. The patient is not nervous/anxious and does not have insomnia.     MEDICAL HISTORY:  Past Medical History:  Diagnosis Date   Actinic keratosis    Anticoagulant long-term use    ELIQUIS    Arthritis    Basal cell carcinoma 01/27/2010   Right spinal upper back. Superficial.    Basal cell carcinoma 11/15/2019   Left inferior antihelix post. to tragus. Superficial and nodular patterns. Excised: 11/27/2019   Basal cell carcinoma 11/15/2019   Left mid lateral nose. Nodular and infiltrative patterns. Excised: 11/27/2019   Basal cell carcinoma 11/27/2019   Left mid superior helix. BCC with focal sclerosis.   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 Slight atypia. Edges free.   Dysplastic nevus 06/30/2023   R flank, moderate atypia   ED (erectile dysfunction)    GERD (gastroesophageal reflux disease)    Hiatal hernia    History of DVT of lower extremity    POST OP KNEE SURGERY   History of transient ischemic attack (TIA)    PER PT HX  ?POSSIBLE-- PT ON ELIQUIS    Hyperlipidemia    Hypertension    Hypogonadism male    OSA (obstructive sleep apnea)    per pt positive sleep study yrs ago recommended CPAP --  pt Non- compliant -- states uses Breath Rite Nasal Strips  Peripheral neuropathy    Prostate cancer [facility]) urologist-  dr tannenbaum/  oncologist-- dr patrcia   dx 03-25-2015  T1c, Gleason 4+3  s/p  cryoablation prostate 06-02-2015/  external  radiation therapy 08-16-2016 to 09-20-2016/  scheduled for radioactive seed implants 10-22-2016   TIA (transient ischemic attack)    ?? 08/20/2014? Eliquis  07/02/15   Type 2 diabetes mellitus (HCC)    followed by dr jerona sayre [facility])  last  A1c 8.4 on 07-27-2016 per lov note   Umbilical hernia    Wears glasses    Wears hearing aid    bilateral    SURGICAL HISTORY: Past Surgical History:  Procedure Laterality Date   COLONOSCOPY WITH PROPOFOL  N/A 05/30/2017   Procedure: COLONOSCOPY WITH PROPOFOL ;  Surgeon: Viktoria Lamar DASEN, MD;  Location: [facility]COPY;  Service: Endoscopy;  Laterality: N/A;   COLONOSCOPY WITH PROPOFOL  N/A 12/28/2021   Procedure: COLONOSCOPY WITH PROPOFOL ;  Surgeon: Onita Elspeth Sharper, DO;  Location: [facility] ENDOSCOPY;  Service: Gastroenterology;  Laterality: N/A;   CRYOABLATION N/A 06/02/2015   Procedure: CRYO ABLATION PROSTATE;  Surgeon: Arlena Gal, MD;  Location: [facility];  Service: Urology;  Laterality: N/A;   CYSTOSCOPY WITH URETHRAL DILATATION N/A 05/04/2019   Procedure: CYSTOSCOPY WITH URETHRAL DILATATION;  Surgeon: Alvaro Hummer, MD;  Location: [facility];  Service: Urology;  Laterality: N/A;   ELBOW BURSA SURGERY Left Feb 1997   prostate biopsies     x12, cryotherapy   RADIOACTIVE SEED IMPLANT N/A 10/22/2016   Procedure: RADIOACTIVE SEED IMPLANT/BRACHYTHERAPY IMPLANT, URETHRAL STRICTURE DIALIATION;  Surgeon: Arlena Gal, MD;  Location: [facility];  Service: Urology;  Laterality: N/A;   REVERSE SHOULDER ARTHROPLASTY Right 02/10/2024   Procedure: ARTHROPLASTY, SHOULDER, TOTAL, REVERSE;  Surgeon: Sharl Selinda Dover, MD;  Location: [facility];  Service: Orthopedics;  Laterality: Right;   SHOULDER OPEN ROTATOR CUFF REPAIR Left 09-04-2014   skin cancer L ear     Dr Hester    TOTAL KNEE ARTHROPLASTY Bilateral right 08-13-2008  &  left 11-14-2008   UMBILICAL HERNIA REPAIR  06-23-2012   ureter stretch  05/02/2019   [facility] Dr Alvaro    SOCIAL HISTORY: Social History   Socioeconomic History   Marital status: Married    Spouse name: Not on file   Number of children: 2   Years of education: Not on file   Highest education level:  Doctorate  Occupational History   Not on file  Tobacco Use   Smoking status: Never   Smokeless tobacco: Never  Vaping Use   Vaping status: Never Used  Substance and Sexual Activity   Alcohol  use: Never   Drug use: Never   Sexual activity: Not Currently  Other Topics Concern   Not on file  Social History Narrative   Lives at home with wife   Caffeine: pepsi   Social Drivers of Health   Financial Resource Strain: Patient Declined (05/21/2024)   Received from [facility]   Overall Financial Resource Strain (CARDIA)    Difficulty of Paying Living Expenses: Patient declined  Food Insecurity: Patient Declined (05/21/2024)   Received from [facility]   Hunger Vital Sign    Within the past 12 months, you worried that your food would run out before you got the money to buy more.: Patient declined    Within the past 12 months, the food you bought just didn't last and you didn't have money to get more.: Patient declined  Transportation Needs: Patient Declined (05/21/2024)   Received from [facility] - Transportation    In the past 12 months, has lack of transportation kept you from medical appointments or from getting medications?: Patient declined    Lack of Transportation (Non-Medical): Patient declined  Physical Activity: Not on file  Stress: Not on file  Social Connections: Moderately Integrated (05/13/2024)   Social Connection and Isolation Panel    Frequency of Communication with Friends and Family: More than three times a week    Frequency of Social Gatherings with Friends and Family: More than three times a week    Attends Religious Services: More than 4 times per year    Active Member of [employer] or Organizations: No    Attends Banker Meetings: Never    Marital Status: Married  Catering manager Violence: Not At Risk (05/13/2024)   Humiliation, Afraid, Rape, and Kick questionnaire    Fear of Current or  Ex-Partner: No    Emotionally Abused: No    Physically Abused: No    Sexually Abused: No    FAMILY HISTORY: Family History  Problem Relation Age of Onset   Cancer Mother 57       colon/resection    ALLERGIES:  is allergic to bee venom, yellow jacket venom, crestor [rosuvastatin], vytorin [ezetimibe-simvastatin], dynapen [dicloxacillin], and jardiance [empagliflozin].  MEDICATIONS:  Current Outpatient Medications  Medication Sig Dispense Refill   ACCU-CHEK FASTCLIX LANCETS MISC   0   amLODipine  (NORVASC ) 5 MG tablet Take 5 mg by mouth every evening.     Ascorbic Acid (VITAMIN C) 1000 MG tablet Take 1,000 mg by mouth 2 (two) times daily.     bisoprolol -hydrochlorothiazide (ZIAC) 10-6.25 MG tablet Take 1 tablet by mouth daily.     clotrimazole (LOTRIMIN) 1 % external solution Apply 1 Application topically 2 (two) times daily.     cyanocobalamin (VITAMIN B12) 1000 MCG tablet Take 1,000 mcg by mouth daily.     enoxaparin  (LOVENOX ) 150 MG/ML injection Inject 1 mL (150 mg total) into the skin daily. 30 mL 0   EPINEPHrine 0.3 mg/0.3 mL IJ SOAJ injection INJECT 0.3 MG INTO THE MUSCLE ONCE AS NEEDED FOR ANAPHYLAXIS     fluorouracil (EFUDEX) 5 % cream Apply 1 Application topically at bedtime.     furosemide  (LASIX ) 40 MG tablet Take 40 mg by mouth daily.      glimepiride  (AMARYL ) 2 MG tablet Take 2 mg by mouth 2 (two) times daily.     insulin  glargine-yfgn (SEMGLEE ) 100 UNIT/ML injection Inject 26 Units into the skin at bedtime.     KRILL OIL PO Take 1 capsule by mouth daily.     metFORMIN  (GLUCOPHAGE ) 500 MG tablet Take 500 mg by mouth 2 (two) times daily.     NOVOLOG  FLEXPEN 100 UNIT/ML FlexPen Inject 16 Units into the skin 3 (three) times daily with meals.     omeprazole (PRILOSEC) 40 MG capsule Take 40 mg by mouth daily.     pioglitazone  (ACTOS ) 45 MG tablet Take 45 mg by mouth daily.     quinapril (ACCUPRIL) 20 MG tablet Take 20 mg by mouth 2 (two) times daily.     Red Yeast Rice 600 MG  CAPS Take 600 mg by mouth 2 (two) times daily.     urea (CARMOL) 20 % cream Apply 1 Application topically daily.     No current facility-administered medications for this visit.   Facility-Administered Medications Ordered in Other Visits  Medication Dose Route Frequency Provider Last Rate Last Admin   acetaminophen  (TYLENOL ) tablet 975 mg  975 mg Oral Q6H PRN Chales Idol, MD        PHYSICAL EXAMINATION:   Vitals:   06/08/24 1314  BP: 130/87  Pulse: 99  Resp: 16  Temp: 97.6 F (36.4 C)  SpO2: 96%   Filed Weights   06/08/24 1314  Weight: 257 lb (116.6 kg)    Physical Exam Vitals and nursing note reviewed.  HENT:     Head: Normocephalic and atraumatic.     Mouth/Throat:     Pharynx: Oropharynx is clear.  Eyes:     Extraocular Movements: Extraocular movements intact.     Pupils: Pupils are equal, round, and reactive to light.  Cardiovascular:     Rate and Rhythm: Normal rate and regular rhythm.  Pulmonary:     Comments: Decreased breath sounds bilaterally.  Abdominal:     Palpations: Abdomen is soft.  Musculoskeletal:        General: Normal range of motion.     Cervical back: Normal range of motion.  Skin:    General: Skin is warm.  Neurological:     General: No focal deficit present.     Mental Status: [facility] Zhang is alert and oriented to person, place, and time.  Psychiatric:        Behavior: Behavior normal.        Judgment: Judgment normal.     LABORATORY DATA:  I have reviewed the data as listed Lab Results  Component Value Date   WBC 4.8 06/08/2024   HGB 11.9 (L) 06/08/2024   HCT 35.4 (L) 06/08/2024   MCV 94.7 06/08/2024   PLT 269 06/08/2024   Recent Labs    05/13/24 0559 05/14/24 0143 06/08/24 1302  NA 140 140 136  K 3.7 3.5 3.7  CL 104 102 104  CO2 25 25 22   GLUCOSE 173* 178* 222*  BUN 32* 37* 19  CREATININE 1.90* 1.90* 1.79*  CALCIUM 9.3 9.0 9.0  GFRNONAA 35* 35* 38*  PROT 6.7  --  6.8  ALBUMIN 3.7  --  3.8  AST 20  --  23  ALT 18   --  23  ALKPHOS 65  --  53  BILITOT 0.8  --  0.6    RADIOGRAPHIC STUDIES: I have personally reviewed the radiological images as listed and agreed with the findings in the report. US  Venous Img Lower Unilateral Right Addendum Date: 05/13/2024 ADDENDUM REPORT: 05/13/2024 07:55 ADDENDUM: I just received a phone call from Dr. Nicholaus. The laterality on this report is not accurate. This study is a RIGHT LOWER EXTREMITY VENOUS ULTRASOUND. Imaging of the RIGHT lower extremity was performed. The nonocclusive thrombus is in the RIGHT femoral vein. Dr. Nicholaus is aware of this addendum. Electronically Signed   By: Camellia Candle M.D.   On: 05/13/2024 07:55   Result Date: 05/13/2024 CLINICAL DATA:  Calf pain.  History of DVT. EXAM: LEFT LOWER EXTREMITY VENOUS DOPPLER ULTRASOUND TECHNIQUE: Gray-scale sonography with graded compression, as well as color Doppler and duplex ultrasound were performed to evaluate the lower extremity deep venous systems from the level of the common femoral vein and including the common femoral, femoral, profunda femoral, popliteal and calf veins including the posterior tibial, peroneal and gastrocnemius veins when visible. The superficial great saphenous vein was also interrogated. Spectral Doppler was utilized to evaluate flow at rest and with distal augmentation maneuvers in the common femoral, femoral and popliteal veins.  COMPARISON:  Reports for right lower extremity DVT ultrasound 07/04/2009 and 09/15/2009. Images for those studies is currently unavailable. FINDINGS: Contralateral Common Femoral Vein: Respiratory phasicity is normal and symmetric with the symptomatic side. No evidence of thrombus. Normal compressibility. Common Femoral Vein: No evidence of thrombus. Normal compressibility, respiratory phasicity and response to augmentation. Saphenofemoral Junction: No evidence of thrombus. Normal compressibility and flow on color Doppler imaging. Profunda Femoral Vein: No evidence of  thrombus. Normal compressibility and flow on color Doppler imaging. Femoral Vein: Nonocclusive thrombus evident. This nonocclusive thrombus does not have an overtly string like appearance as would be expected in the setting of chronic thrombus Popliteal Vein: No evidence of thrombus. Normal compressibility, respiratory phasicity and response to augmentation. Calf Veins: No evidence of thrombus. Normal compressibility and flow on color Doppler imaging. Other Findings:  None. IMPRESSION: Positive for nonocclusive DVT in the left femoral vein. Acute nonocclusive thrombus favored in this patient with a remote history of DVT in the right lower extremity. Critical Value/emergent results were called by telephone at the time of interpretation on 05/13/2024 at 7:18 am to provider Dr. Nicholaus, who verbally acknowledged these results. Electronically Signed: By: Camellia Candle M.D. 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 Acute nonocclusive thrombus favored in this patient with a remote history of DVT in the right lower extremity.    # However, DVT was thought to be provoked given his long [place]  road trip.  Significant improvement of his leg pain on Lovenox -recommend finishing up a total of 1 month of anticoagulation.  I think it is reasonable for the patient to go on Eliquis  after finishing his remaining Lovenox .  # Other options being-Pradaxa-if [facility] Zhang has further DVTs on Eliquis .  Patient and wife in agreement.  #Compression stockings both legs.  Wear them during daytime.  Take them off at night; and keep the legs propped up in bed.   # History of CKD-stage III  # mild anemia-hemoglobin 11.9-no obvious evidence of bleeding.  Recommend gentle iron [iron biglycinate; 28 mg ] 1 pill a day.   This pill is unlikely to cause stomach upset or cause constipation.     # History of TIA-  #Since patient is clinically stable I think is reasonable for the patient to follow-up with PCP/can follow-up with us  as needed.  Patient comfortable with the plan; to call us  if any questions or concerns in the interim.  Patient and wife in agreement.   # DISPOSITION: # follow up as needed- Dr.B  Cc; Dr.Baboff   Above plan of care was discussed with patient/family in detail.  My contact information was given to the patient/family.       Cindy JONELLE Joe, MD 06/08/2024 2:35 PM

## 2024-06-08 NOTE — Assessment & Plan Note (Deleted)
 78 y.o.  male pleasant patient with a history of DVT [8-10 years ago]-left lower extremity status post surgery; and history of TIA on Eliquis  is currently in the hospital for right lower extremity DVT.    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 Acute nonocclusive thrombus favored in this patient with a remote history of DVT in the right lower extremity.  -while on Eliquis .     # History of CKD-stage III  # #Recommend gentle iron [iron biglycinate; 28 mg ] 1 pill a day.  This pill is unlikely to cause stomach upset or cause constipation.     #Compression stockings both legs.  Wear them during daytime.  Take them off at night; and keep the legs propped up in bed. You can find them online or at stores- like [employer], [place]     # History of TIA-   Recommendations/plan:   # Patient admits compliance to Eliquis  throughout his road trip to [place] .  Long drive is a potential risk factor-however DVT happened while compliant on Eliquis . Given the new blood clot I would recommend starting the patient on Lovenox  1.5 mg/kg dose once a day x 1 month.  Renally adjusted discussed with pharmacy.  Alternate anticoagulation Pradaxa could be considered outpatient basis.   # Consider hypercoagulable As outpatient-this might not cause any treatment decisions as patient will need indefinite anticoagulation.  However for the patient however-could have potential consequences from family.  # DISPOSITION: # follow up as needed- Dr.B  Cc; Dr.Baboff

## 2024-06-08 NOTE — Assessment & Plan Note (Addendum)
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 Acute nonocclusive thrombus favored in this patient with a remote history of DVT in the right lower extremity.    # However, DVT was thought to be provoked given his long [place]  road trip.  Significant improvement of his leg pain on Lovenox -recommend finishing up a total of 1 month of anticoagulation.  I think it is reasonable for the patient to go on Eliquis  after finishing his remaining Lovenox .  # Other options being-Pradaxa-if he has further DVTs on Eliquis .  Patient and wife in agreement.  #Compression stockings both legs.  Wear them during daytime.  Take them off at night; and keep the legs propped up in bed.   # History of CKD-stage III  # mild anemia-hemoglobin 11.9-no obvious evidence of bleeding.  Recommend gentle iron [iron biglycinate; 28 mg ] 1 pill a day.  This pill is unlikely to cause stomach upset or cause constipation.     # History of TIA-  #Since patient is clinically stable I think is reasonable for the patient to follow-up with PCP/can follow-up with us  as needed.  Patient comfortable with the plan; to call us  if any questions or concerns in the interim.  Patient and wife in agreement.   # DISPOSITION: # follow up as needed- Dr.B  Cc; Dr.Baboff

## 2024-06-08 NOTE — Patient Instructions (Addendum)
#  Recommend gentle iron [iron biglycinate; 28 mg ] 1 pill a day.  This pill is unlikely to cause stomach upset or cause constipation.  Available Over the counter or talk to pharmacist.     #Compression stockings both legs.  Wear them during daytime.  Take them off at night; and keep the legs propped up in bed. You can find them online or at stores- like [employer],

## 2024-07-16 ENCOUNTER — Encounter: Payer: Self-pay | Admitting: Podiatry

## 2024-07-16 ENCOUNTER — Ambulatory Visit: Admitting: Podiatry

## 2024-07-16 DIAGNOSIS — M79675 Pain in left toe(s): Secondary | ICD-10-CM | POA: Diagnosis not present

## 2024-07-16 DIAGNOSIS — M79674 Pain in right toe(s): Secondary | ICD-10-CM | POA: Diagnosis not present

## 2024-07-16 DIAGNOSIS — E1149 Type 2 diabetes mellitus with other diabetic neurological complication: Secondary | ICD-10-CM | POA: Diagnosis not present

## 2024-07-16 DIAGNOSIS — B351 Tinea unguium: Secondary | ICD-10-CM

## 2024-07-16 NOTE — Progress Notes (Signed)
 Subjective: Chief Complaint  Patient presents with   RFC    Rm11 Diabetic foot care A1c 7    78 y.o. returns the office today for painful, elongated, thickened toenails which he cannot trim himself.  They cause discomfort to become elongated.  He has tried multiple medications for the nail fungus.  Recently purchased an over-the-counter UV light which  he feels it started to be helpful.  Last A1c was 7.4 on 07/05/2024  Objective: AAO 3, NAD DP/PT pulses palpable, CRT less than 3 seconds Nails hypertrophic, dystrophic, elongated, brittle, discolored 10.  In particular the second digit nails are the most hypertrophic and dystrophic.  There is no pain to the nails today.  There is no edema, erythema or signs of infection.  No open lesions. No pain with calf compression, swelling, warmth, erythema.  Assessment: Patient presents with symptomatic onychomycosis, onychodystrophy  Plan: -Treatment options including alternatives, risks, complications were discussed -Sharply debrided nails x 10 without any complications or bleeding yeah -He is going to continue with the topical treatments for the nail fungus.   Donnice JONELLE Fees DPM

## 2024-10-13 ENCOUNTER — Emergency Department

## 2024-10-13 ENCOUNTER — Emergency Department
Admission: EM | Admit: 2024-10-13 | Discharge: 2024-10-13 | Disposition: A | Discharge status: Home / Self Care | Attending: Emergency Medicine | Admitting: Emergency Medicine

## 2024-10-13 ENCOUNTER — Other Ambulatory Visit: Payer: Self-pay

## 2024-10-13 DIAGNOSIS — I724 Aneurysm of artery of lower extremity: Secondary | ICD-10-CM | POA: Insufficient documentation

## 2024-10-13 DIAGNOSIS — Z86718 Personal history of other venous thrombosis and embolism: Secondary | ICD-10-CM | POA: Insufficient documentation

## 2024-10-13 DIAGNOSIS — M7989 Other specified soft tissue disorders: Secondary | ICD-10-CM | POA: Diagnosis present

## 2024-10-13 DIAGNOSIS — I739 Peripheral vascular disease, unspecified: Secondary | ICD-10-CM | POA: Diagnosis not present

## 2024-10-13 DIAGNOSIS — Z7901 Long term (current) use of anticoagulants: Secondary | ICD-10-CM | POA: Insufficient documentation

## 2024-10-13 HISTORY — DX: Acute embolism and thrombosis of unspecified deep veins of unspecified lower extremity: I82.409

## 2024-10-13 LAB — BASIC METABOLIC PANEL WITH GFR
Anion gap: 11 (ref 5–15)
BUN: 29 mg/dL — ABNORMAL HIGH (ref 8–23)
CO2: 25 mmol/L (ref 22–32)
Calcium: 9.2 mg/dL (ref 8.9–10.3)
Chloride: 104 mmol/L (ref 98–111)
Creatinine, Ser: 1.92 mg/dL — ABNORMAL HIGH (ref 0.61–1.24)
GFR, Estimated: 35 mL/min — ABNORMAL LOW
Glucose, Bld: 210 mg/dL — ABNORMAL HIGH (ref 70–99)
Potassium: 4.1 mmol/L (ref 3.5–5.1)
Sodium: 140 mmol/L (ref 135–145)

## 2024-10-13 LAB — CBC WITH DIFFERENTIAL/PLATELET
Abs Immature Granulocytes: 0.02 K/uL (ref 0.00–0.07)
Basophils Absolute: 0 K/uL (ref 0.0–0.1)
Basophils Relative: 1 %
Eosinophils Absolute: 0.2 K/uL (ref 0.0–0.5)
Eosinophils Relative: 4 %
HCT: 36.6 % — ABNORMAL LOW (ref 39.0–52.0)
Hemoglobin: 12.2 g/dL — ABNORMAL LOW (ref 13.0–17.0)
Immature Granulocytes: 0 %
Lymphocytes Relative: 32 %
Lymphs Abs: 1.7 K/uL (ref 0.7–4.0)
MCH: 32.6 pg (ref 26.0–34.0)
MCHC: 33.3 g/dL (ref 30.0–36.0)
MCV: 97.9 fL (ref 80.0–100.0)
Monocytes Absolute: 0.5 K/uL (ref 0.1–1.0)
Monocytes Relative: 9 %
Neutro Abs: 2.9 K/uL (ref 1.7–7.7)
Neutrophils Relative %: 54 %
Platelets: 252 K/uL (ref 150–400)
RBC: 3.74 MIL/uL — ABNORMAL LOW (ref 4.22–5.81)
RDW: 14.1 % (ref 11.5–15.5)
WBC: 5.4 K/uL (ref 4.0–10.5)
nRBC: 0 % (ref 0.0–0.2)

## 2024-10-13 MED ORDER — IOHEXOL 350 MG/ML SOLN
100.0000 mL | Freq: Once | INTRAVENOUS | Status: AC | PRN
  Administered 2024-10-13: 100 mL via INTRAVENOUS

## 2024-10-13 NOTE — Discharge Instructions (Addendum)
 Continue take your blood thinner and return to the ER if you develop pain that is not going away or color changes, coolness to your leg or any other concerns.  You need to call the vascular team to make a follow-up appointment to discuss the findings on CT imaging.  IMPRESSION:  1. 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  2. Bilateral popliteal 3-vessel trifurcation with discontinuous three-vessel  arterial flow to the feet due to severe atherosclerotic plaque, with diminutive  flow to the right foot via the posterior tibial artery and to the left ankle  via the anterior tibial artery.  3. Severe atherosclerotic plaque of the aorta.   IMPRESSION: 1. No acute DVT of the RIGHT lower extremity. 2. Minimal residual nonocclusive thrombus of the distal RIGHT femoral vein. 3. Dilated RIGHT popliteal artery measuring up to 2.1 cm suspicious for aneurysm. Further evaluation with CT angiography of the lower extremity should be performed to better evaluate this finding.

## 2024-10-13 NOTE — ED Provider Notes (Signed)
 "  [facility] Provider Note    Event Date/Time   First MD Initiated Contact with Patient 10/13/24 1623     (approximate)   History   Leg Swelling   HPI  PLUMMER MATICH is a 78 y.o. 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  Patient reports pain initially on the upper leg but now reports pain more in the back of the right calf area.  He reports maybe there is a little bit of increased swelling but not really sure about that.  He reports compliance with his Eliquis  denies any chest pain, shortness of breath.  Denies any falls or injury to the leg.  He did report some back pain over a week ago but denies any currently.  He reports only pain intermittently in the right leg.  He denies any pain at this time.  Physical Exam   Triage Vital Signs: ED Triage Vitals  Encounter Vitals Group     BP 10/13/24 1500 115/65     Girls Systolic BP Percentile --      Girls Diastolic BP Percentile --      Boys Systolic BP Percentile --      Boys Diastolic BP Percentile --      Pulse Rate 10/13/24 1500 68     Resp 10/13/24 1500 20     Temp 10/13/24 1500 98.3 F (36.8 C)     Temp Source 10/13/24 1500 Oral     SpO2 10/13/24 1500 93 %     Weight 10/13/24 1504 243 lb (110.2 kg)     Height 10/13/24 1504 5' 9 (1.753 m)     Head Circumference --      Peak Flow --      Pain Score 10/13/24 1501 3     Pain Loc --      Pain Education --      Exclude from Growth Chart --     Most recent vital signs: Vitals:   10/13/24 1500  BP: 115/65  Pulse: 68  Resp: 20  Temp: 98.3 F (36.8 C)  SpO2: 93%     General: Awake, no distress.  CV:  Good peripheral perfusion.  Resp:  Normal effort.  Abd:  No distention.  Soft nontender Other:  Slightly swollen right leg compared to the left.  Good distal pulse warm and well perfused foot.  Able to flex  and extend the ankle.  No obvious skin changes noted to it. No lift up the leg.  No weakness noted. Difficult to assess patient's cap refill in toes given his thick discolored toes but again the toes feel nice and warm and well-perfused  ED Results / Procedures / Treatments   Labs (all labs ordered are listed, but only abnormal results are displayed) Labs Reviewed  CBC WITH DIFFERENTIAL/PLATELET - Abnormal; Notable for the following components:      Result Value   RBC 3.74 (*)    Hemoglobin 12.2 (*)    HCT 36.6 (*)    All other components within normal limits  BASIC METABOLIC PANEL WITH GFR  PROTIME-INR  APTT      RADIOLOGY   No obvious DVT noted per my interpretation waiting on radiology read  IMPRESSION: 1. No acute DVT of the RIGHT lower extremity. 2. Minimal residual nonocclusive thrombus of the distal RIGHT femoral vein.  3. Dilated RIGHT popliteal artery measuring up to 2.1 cm suspicious for aneurysm. Further evaluation with CT angiography of the lower extremity should be performed to better evaluate this finding.   PROCEDURES:  Critical Care performed: No  Procedures   MEDICATIONS ORDERED IN ED: Medications - No data to display   IMPRESSION / MDM / ASSESSMENT AND PLAN / ED COURSE  I reviewed the triage vital signs and the nursing notes.   Patient's presentation is most consistent with acute presentation with potential threat to life or bodily function.   Patient comes in with intermittent right leg pain.  Ultrasound was ordered from triage evaluate for DVT this was concerning for possible popliteal artery aneurysm therefore CT imaging was recommended.  Discussed with radiology and CT with runoff was ordered.  Patient has good distal pulse so unlikely arterial acute occlusion.  CBC shows hemoglobin of 12.2 white count was normal BMP shows creatinine of 1.9 with a GFR that is in range for CT imaging.  Patient declined need anything for pain  medication.  IMPRESSION: 1. 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 2. Bilateral popliteal 3-vessel trifurcation with discontinuous three-vessel arterial flow to the feet due to severe atherosclerotic plaque, with diminutive flow to the right foot via the posterior tibial artery and to the left ankle via the anterior tibial artery. 3. Severe atherosclerotic plaque of the aorta.    Discussed with Dr. Elnor from vascular to see if patient needed admission for angiogram given the above findings.  After discussion with vascular surgeon he did not feel he this need to be addressed emergently.  Patient does have warm and well-perfused feet he has sensation is intact he denies any pain currently he is able to flex and extend the feet without any motor deficits.  He recommend following up outpatient.  Did discuss this with family but did discuss return precautions in regards to this.  They expressed understanding felt comfortable with discharge home and will follow-up outpatient with Dr. Marea or Dr. Jama.  The patient is on the cardiac monitor to evaluate for evidence of arrhythmia and/or significant heart rate changes.      FINAL CLINICAL IMPRESSION(S) / ED DIAGNOSES   Final diagnoses:  Peripheral vascular disease  Popliteal aneurysm     Rx / DC Orders   ED Discharge Orders     None        Note:  This document was prepared using Dragon voice recognition software and may include unintentional dictation errors.   Ernest Ronal BRAVO, MD 10/13/24 2145  "

## 2024-10-13 NOTE — ED Triage Notes (Signed)
 Pt to ED with wife for RLE swelling since 4 days with increasing pain since then to thigh and calf. Pain worse with walking. Takes apixiban BID, last dose this AM. Hx DVT 6 months ago.

## 2024-10-17 ENCOUNTER — Other Ambulatory Visit: Payer: Self-pay | Admitting: Orthopedic Surgery

## 2024-10-17 DIAGNOSIS — M4807 Spinal stenosis, lumbosacral region: Secondary | ICD-10-CM

## 2024-10-19 ENCOUNTER — Ambulatory Visit
Admission: RE | Admit: 2024-10-19 | Discharge: 2024-10-19 | Disposition: A | Discharge status: Home / Self Care | Source: Ambulatory Visit | Attending: Orthopedic Surgery | Admitting: Orthopedic Surgery

## 2024-10-19 DIAGNOSIS — M4807 Spinal stenosis, lumbosacral region: Secondary | ICD-10-CM | POA: Insufficient documentation
